# Patient Record
Sex: Male | Born: 1935
Health system: Southern US, Community
[De-identification: ages and names within clinical notes are randomized; demographics above are authoritative.]

## PROBLEM LIST (undated history)

## (undated) DIAGNOSIS — G2581 Restless legs syndrome: Secondary | ICD-10-CM

## (undated) DIAGNOSIS — G4733 Obstructive sleep apnea (adult) (pediatric): Secondary | ICD-10-CM

## (undated) DIAGNOSIS — J189 Pneumonia, unspecified organism: Secondary | ICD-10-CM

## (undated) DIAGNOSIS — I10 Essential (primary) hypertension: Secondary | ICD-10-CM

## (undated) DIAGNOSIS — C801 Malignant (primary) neoplasm, unspecified: Secondary | ICD-10-CM

## (undated) DIAGNOSIS — I2510 Atherosclerotic heart disease of native coronary artery without angina pectoris: Secondary | ICD-10-CM

## (undated) DIAGNOSIS — R5383 Other fatigue: Secondary | ICD-10-CM

## (undated) DIAGNOSIS — R0600 Dyspnea, unspecified: Secondary | ICD-10-CM

## (undated) DIAGNOSIS — E7800 Pure hypercholesterolemia, unspecified: Secondary | ICD-10-CM

## (undated) DIAGNOSIS — R42 Dizziness and giddiness: Secondary | ICD-10-CM

## (undated) HISTORY — PX: KNEE SURGERY: SHX244

## (undated) HISTORY — DX: Pure hypercholesterolemia, unspecified: E78.00

## (undated) HISTORY — PX: CARDIAC CATHETERIZATION: SHX172

## (undated) HISTORY — PX: APPENDECTOMY: SHX54

## (undated) HISTORY — PX: SHOULDER ARTHROSCOPY: SHX128

## (undated) HISTORY — DX: Obstructive sleep apnea (adult) (pediatric): G47.33

## (undated) HISTORY — DX: Malignant (primary) neoplasm, unspecified: C80.1

## (undated) HISTORY — DX: Dizziness and giddiness: R42

## (undated) HISTORY — DX: Dyspnea, unspecified: R06.00

## (undated) HISTORY — DX: Atherosclerotic heart disease of native coronary artery without angina pectoris: I25.10

## (undated) HISTORY — DX: Essential (primary) hypertension: I10

## (undated) HISTORY — DX: Other fatigue: R53.83

## (undated) HISTORY — DX: Pneumonia, unspecified organism: J18.9

---

## 1999-08-13 ENCOUNTER — Ambulatory Visit (HOSPITAL_COMMUNITY): Admission: RE | Admit: 1999-08-13 | Discharge: 1999-08-13 | Payer: Self-pay | Admitting: *Deleted

## 1999-09-08 ENCOUNTER — Ambulatory Visit (HOSPITAL_COMMUNITY): Admission: RE | Admit: 1999-09-08 | Discharge: 1999-09-08 | Payer: Self-pay | Admitting: *Deleted

## 1999-09-08 ENCOUNTER — Encounter: Payer: Self-pay | Admitting: *Deleted

## 1999-10-24 ENCOUNTER — Ambulatory Visit: Admission: RE | Admit: 1999-10-24 | Discharge: 1999-10-24 | Payer: Self-pay | Admitting: *Deleted

## 2000-02-29 ENCOUNTER — Ambulatory Visit: Admission: RE | Admit: 2000-02-29 | Discharge: 2000-02-29 | Payer: Self-pay | Admitting: Pulmonary Disease

## 2000-09-18 ENCOUNTER — Ambulatory Visit (HOSPITAL_COMMUNITY): Admission: RE | Admit: 2000-09-18 | Discharge: 2000-09-18 | Payer: Self-pay | Admitting: Cardiology

## 2000-10-31 ENCOUNTER — Encounter: Admission: RE | Admit: 2000-10-31 | Discharge: 2000-10-31 | Payer: Self-pay | Admitting: *Deleted

## 2000-11-16 ENCOUNTER — Ambulatory Visit (HOSPITAL_COMMUNITY): Admission: RE | Admit: 2000-11-16 | Discharge: 2000-11-16 | Payer: Self-pay | Admitting: Internal Medicine

## 2000-11-16 ENCOUNTER — Encounter: Payer: Self-pay | Admitting: Internal Medicine

## 2001-07-18 ENCOUNTER — Ambulatory Visit (HOSPITAL_COMMUNITY): Admission: RE | Admit: 2001-07-18 | Discharge: 2001-07-18 | Payer: Self-pay | Admitting: *Deleted

## 2001-07-18 ENCOUNTER — Encounter (INDEPENDENT_AMBULATORY_CARE_PROVIDER_SITE_OTHER): Payer: Self-pay

## 2002-10-25 ENCOUNTER — Emergency Department (HOSPITAL_COMMUNITY): Admission: EM | Admit: 2002-10-25 | Discharge: 2002-10-25 | Payer: Self-pay | Admitting: Emergency Medicine

## 2003-08-07 ENCOUNTER — Encounter (INDEPENDENT_AMBULATORY_CARE_PROVIDER_SITE_OTHER): Payer: Self-pay | Admitting: Specialist

## 2003-08-07 ENCOUNTER — Ambulatory Visit (HOSPITAL_COMMUNITY): Admission: RE | Admit: 2003-08-07 | Discharge: 2003-08-07 | Payer: Self-pay | Admitting: *Deleted

## 2005-10-07 ENCOUNTER — Ambulatory Visit (HOSPITAL_COMMUNITY): Admission: RE | Admit: 2005-10-07 | Discharge: 2005-10-07 | Payer: Self-pay | Admitting: *Deleted

## 2010-09-03 ENCOUNTER — Ambulatory Visit: Payer: Self-pay | Admitting: Cardiology

## 2010-09-03 ENCOUNTER — Encounter: Admission: RE | Admit: 2010-09-03 | Discharge: 2010-09-03 | Payer: Self-pay | Admitting: Cardiology

## 2010-09-13 ENCOUNTER — Inpatient Hospital Stay (HOSPITAL_BASED_OUTPATIENT_CLINIC_OR_DEPARTMENT_OTHER): Admission: RE | Admit: 2010-09-13 | Discharge: 2010-09-13 | Payer: Self-pay | Admitting: Cardiology

## 2010-09-13 ENCOUNTER — Ambulatory Visit: Payer: Self-pay | Admitting: Cardiovascular Disease

## 2010-09-27 ENCOUNTER — Encounter: Payer: Self-pay | Admitting: Pulmonary Disease

## 2010-09-27 ENCOUNTER — Ambulatory Visit: Payer: Self-pay

## 2010-09-29 ENCOUNTER — Ambulatory Visit: Payer: Self-pay | Admitting: Cardiology

## 2010-10-21 DIAGNOSIS — R42 Dizziness and giddiness: Secondary | ICD-10-CM | POA: Insufficient documentation

## 2010-10-21 DIAGNOSIS — E7800 Pure hypercholesterolemia, unspecified: Secondary | ICD-10-CM | POA: Insufficient documentation

## 2010-10-21 DIAGNOSIS — I2510 Atherosclerotic heart disease of native coronary artery without angina pectoris: Secondary | ICD-10-CM | POA: Insufficient documentation

## 2010-10-21 DIAGNOSIS — C61 Malignant neoplasm of prostate: Secondary | ICD-10-CM | POA: Insufficient documentation

## 2010-10-21 DIAGNOSIS — I10 Essential (primary) hypertension: Secondary | ICD-10-CM | POA: Insufficient documentation

## 2010-10-22 ENCOUNTER — Ambulatory Visit
Admission: RE | Admit: 2010-10-22 | Discharge: 2010-10-22 | Payer: Self-pay | Source: Home / Self Care | Attending: Pulmonary Disease | Admitting: Pulmonary Disease

## 2010-10-22 DIAGNOSIS — G4733 Obstructive sleep apnea (adult) (pediatric): Secondary | ICD-10-CM | POA: Insufficient documentation

## 2010-10-29 ENCOUNTER — Telehealth: Payer: Self-pay | Admitting: Pulmonary Disease

## 2010-11-18 NOTE — Progress Notes (Signed)
Summary: CPAP ORDER [facility] TOLD THE PT THEY DID NOT HAVE ENOUGH INFO  Phone Note Call from Patient   Caller: Spouse/BARBARA Call For: DR. Shelle Iron Summary of Call: [facility] in [place] called the patient on Monday regarding the CPAP order and advised that the doctors office had not given them the information. Ms Kirsh stated that they have not heard back yet.  Patient can be reached [phone] and really would like to have this taken care of. Initial call taken by: Vedia Coffer,  October 29, 2010 4:15 PM  Follow-up for Phone Call        called and spoke with robin at [facility] and she stated that they have the ov notes from after dec 12 after the sleep study but they need ov notes prior to this ov---explained to her that pt has only seen [facility] once on 1-06, so she stated that they are waiting to get these ov notes from Dr. [name] and once they get them they will be able to fill the order. lmom for pts wife to make her aware and told her to call for any other concerns. Randell Loop CMA  October 29, 2010 4:26 PM      Appended Document: CPAP ORDER [facility] TOLD THE PT THEY DID NOT HAVE ENOUGH INFO please investigate this and see if pt got his cpap machine.  If not, change dme providers to someone who will offer him better service.  If he has not gotten cpap yet, we to adjust his followup visit with me as well.  thanks.

## 2010-11-18 NOTE — Assessment & Plan Note (Signed)
Summary: consult for management of osa   Visit Type:  Initial Consult Copy to:  [name] MD Primary Provider/Referring Provider:  Hessie Diener MD  CC:  Sleep Consult. pt had abnormal sleep study done on 09/27/10.  History of Present Illness: the pt is an 77y/o male who I have been asked to see for management of osa.  He has had a recent sleep study which showed severe osa, with AHI 94/hr and desat to 84%.  He apparently had sleep apnea diagnosed years ago, and tells me he had tolerance issues with cpap at that time.  The pt has been noted to be a loud snorer, and has been told that he has an abnormal breathing pattern during sleep.  He goes to bed btw 11-33mn, and arises at 8-9am to start his day.  He is not rested upon arising.  He notes definite sleep pressure with periods of inactivity, and will often nap during the day.  He get his "second wind" in the evenings because of his napping.  He denies any sleepiness issues with driving.  He tells me his weight is neutral over the last 2 years.    Current Medications (verified): 1)  Prevacid 15 Mg Cpdr (Lansoprazole) .... Once Daily 2)  Coq10 50 Mg Caps (Coenzyme Q10) .... Take 1 Tablet By Mouth Once A Day 3)  B Complex  Tabs (B Complex Vitamins) .... Take 1 Tablet By Mouth Once A Day 4)  Toprol Xl 25 Mg Xr24h-Tab (Metoprolol Succinate) .... Take 1 Tablet By Mouth Two Times A Day 5)  Simvastatin 40 Mg Tabs (Simvastatin) .... Take 1 Tablet By Mouth Once A Day 6)  Aspirin Low Dose 81 Mg Tabs (Aspirin) .... Take 1 Tablet By Mouth Once A Day  Allergies (verified): No Known Drug Allergies  Past History:  Past Medical History:  PROSTATE CANCER (ICD-185) HYPERTENSION (ICD-401.9) HYPERCHOLESTEROLEMIA (ICD-272.0) DIZZINESS, CHRONIC (ICD-780.4) CORONARY ARTERY DISEASE (ICD-414.00)--s/p PCI/stent to LCx 2008    Past Surgical History: appendectomy L knee arthroscopy shoulder arthroscopy 1 stent placement  Family History: Reviewed  history from 10/21/2010 and no changes required. father deceased at age 70 with prostate cancer mother deceased at age 10 with heart disease and copd. father--heart disease daughter: colon cancer  Social History: Reviewed history from 10/21/2010 and no changes required. 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  Review of Systems       The patient complains of shortness of breath with activity, productive cough, acid heartburn, indigestion, tooth/dental problems, and nasal congestion/difficulty breathing through nose.  The patient denies shortness of breath at rest, non-productive cough, coughing up blood, chest pain, irregular heartbeats, loss of appetite, weight change, abdominal pain, difficulty swallowing, sore throat, headaches, sneezing, itching, ear ache, anxiety, depression, hand/feet swelling, joint stiffness or pain, rash, change in color of mucus, and fever.    Vital Signs:  Patient profile:   77 year old male Height:      67 inches Weight:      204.13 pounds BMI:     32.09 O2 Sat:      95 % on Room air Temp:     97.7 degrees F oral Pulse rate:   60 / minute BP sitting:   130 / 62  (right arm) Cuff size:   large  Vitals Entered By: Carver Fila (October 22, 2010 11:29 AM)  O2 Flow:  Room air CC: Sleep  Consult. pt had abnormal sleep study done on 09/27/10 Comments meds and allergies updated Phone number updated Carver Fila  October 22, 2010 11:29 AM    Physical Exam  General:  ow male in nad Eyes:  PERRLA and EOMI.   Nose:  narrowed bilat with deviation of septum, some turbinate hypertrophy Mouth:  normal palate and uvula Neck:  no jvd, tmg, LN Lungs:  clear to auscultation Heart:  rrr, no mrg Abdomen:  soft and nontender, bs+ Extremities:  mild edema noted, no cyanosis  pulses intact distally Neurologic:  awake, but does appear mildly sleepy moves all 4.   Impression &  Recommendations:  Problem # 1:  OBSTRUCTIVE SLEEP APNEA (ICD-327.23) the pt has severe osa by his recent sleep study, and this is clearly impacting his QOL during the day.  I have had a long discussion with the pt about sleep apnea, including its impact on QOL and CV health.  Given the severity of his osa, weight loss coupled with cpap are his best options for treatment.  He is willing to give this a try.  I will set the patient up on cpap at a moderate pressure level to allow for desensitization, and will troubleshoot the device over the next 4-6weeks if needed.  The pt is to call me if having issues with tolerance.  Will then optimize the pressure once patient is able to wear cpap on a consistent basis.  Medications Added to Medication List This Visit: 1)  Prevacid 15 Mg Cpdr (Lansoprazole) .... Once daily  Other Orders: Consultation Level IV (16109) DME Referral (DME)  Patient Instructions: 1)  will start on cpap at a moderate pressure level.  Please call if having tolerance issues. 2)  work on weight reduction 3)  followup with me in 5 weeks.   Immunization History:  Influenza Immunization History:    Influenza:  historical (06/17/2010)  Pneumovax Immunization History:    Pneumovax:  historical (07/17/2008)

## 2010-11-26 ENCOUNTER — Ambulatory Visit (INDEPENDENT_AMBULATORY_CARE_PROVIDER_SITE_OTHER): Payer: Medicare Other | Admitting: Pulmonary Disease

## 2010-11-26 ENCOUNTER — Encounter: Payer: Self-pay | Admitting: Pulmonary Disease

## 2010-11-26 DIAGNOSIS — G4733 Obstructive sleep apnea (adult) (pediatric): Secondary | ICD-10-CM

## 2010-12-01 ENCOUNTER — Telehealth: Payer: Self-pay | Admitting: Pulmonary Disease

## 2010-12-06 ENCOUNTER — Encounter: Payer: Self-pay | Admitting: Pulmonary Disease

## 2010-12-08 NOTE — Progress Notes (Signed)
Summary: cpap issues  Phone Note Call from Patient Call back at [facility] Phone [phone]   Caller: Patient Call For: Jason Murphy Summary of Call: problems re: change in cpap pressure Initial call taken by: Tivis Ringer, CNA,  December 01, 2010 10:38 AM  Follow-up for Phone Call        Pt states pressure was changed to auto Friday. c/o waking up to "powerful air" leaking from mask and tried to adjust for over 30 minutes. Woke up Sat morning dizzy lasting 3 days. Head "cleared on Tuesday". Pt called DME company and has not heard back from them and has not used machine since Friday night. Please advise. Thanks. Zackery Barefoot CMA  December 01, 2010 12:28 PM   Additional Follow-up for Phone Call Additional follow up Details #1::        let him know that he has very severe osa, and probably needs a much higher pressure than what he has been on.  We can optimize pressure at home with auto device, or can return to sleep center and do it all in one night.  If he is having issues with wearing auto , should consider going to sleep center for pressure optimization.  On auto, the pressure is ramping up because HE NEEDS IT. Let me know what he thinks Additional Follow-up by: Barbaraann Share MD,  December 01, 2010 5:02 PM    Additional Follow-up for Phone Call Additional follow up Details #2::    LMOMTCB.Michel Bickers [facility]  December 01, 2010 5:08 PM Patient phoned stated that he was returning a call he can be reached at [phone].Vedia Coffer  December 02, 2010 10:01 AM   called and spoke with pt and explained [facility] recs for pt---pt stated that what he wants is to return the machine to what it was before it was placed on auto---then increase the pressure slightly---pt stated that he does not want to go back to the sleep center to repeat or have another test done.  please advise. Randell Loop CMA  December 02, 2010 10:14 AM   Additional Follow-up for Phone Call Additional follow up Details #3:: Details  for Additional Follow-up Action Taken: I am ok with doing this, as long as he understands we will not be completely treating his sleep apnea.  Ok to put machine on 12 instead of 10 like before, and will see how he does. if he feels he is doing well on 12, followup with me in 6mos.  If not doing well on12, let me know  order sent to [facility] Additional Follow-up by: Barbaraann Share MD,  December 02, 2010 12:44 PM  Pt aware of recs per [facility] and that order has been sent. Carron Curie CMA  December 02, 2010 2:07 PM

## 2010-12-14 NOTE — Letter (Signed)
Summary: CMN for CPAP Supplies/[facility]  CMN for CPAP Supplies/[facility]   Imported By: Sherian Rein 12/08/2010 11:23:52  _____________________________________________________________________  External Attachment:    Type:   Image     Comment:   External Document

## 2010-12-14 NOTE — Assessment & Plan Note (Signed)
Summary: rov for osa   Copy to:  Jason Swaziland MD Primary Provider/Referring Provider:  Hessie Diener MD  CC:  Follow up appt for OSA.  States he is wearing his cpap machine every night.  Approx 7 hours per night.  Denies any complaints with mask.  Pt states his pressure needs to be increased.  States he wakes up feeling rested. Jason Murphy  History of Present Illness: pt comes in today for f/u of his osa.  Started on cpap last visit at moderate pressure level to allow for desensitization.  Wearing compliantly, no issues with mask or pressure.  Sleeping better with increased daytime alertness.  Thinks pressure needs to be higher.   Medications Prior to Update: 1)  Prevacid 15 Mg Cpdr (Lansoprazole) .... Once Daily 2)  Coq10 50 Mg Caps (Coenzyme Q10) .... Take 1 Tablet By Mouth Once A Day 3)  B Complex  Tabs (B Complex Vitamins) .... Take 1 Tablet By Mouth Once A Day 4)  Toprol Xl 25 Mg Xr24h-Tab (Metoprolol Succinate) .... Take 1 Tablet By Mouth Two Times A Day 5)  Simvastatin 40 Mg Tabs (Simvastatin) .... Take 1 Tablet By Mouth Once A Day 6)  Aspirin Low Dose 81 Mg Tabs (Aspirin) .... Take 1 Tablet By Mouth Once A Day  Allergies (verified): No Known Drug Allergies  Past History:  Past medical, surgical, family and social histories (including risk factors) reviewed, and no changes noted (except as noted below).  Past Medical History: Reviewed history from 10/22/2010 and no changes required.  PROSTATE CANCER (ICD-185) HYPERTENSION (ICD-401.9) HYPERCHOLESTEROLEMIA (ICD-272.0) DIZZINESS, CHRONIC (ICD-780.4) CORONARY ARTERY DISEASE (ICD-414.00)--s/p PCI/stent to LCx 2008    Past Surgical History: Reviewed history from 10/22/2010 and no changes required. appendectomy L knee arthroscopy shoulder arthroscopy 1 stent placement  Family History: Reviewed history from 10/22/2010 and no changes required. father deceased at age 57 with prostate cancer mother deceased at age 43 with heart  disease and copd. father--heart disease daughter: colon cancer  Social History: Reviewed history from 10/22/2010 and no changes required. 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  Review of Systems       The patient complains of shortness of breath with activity, productive cough, and sneezing.  The patient denies shortness of breath at rest, non-productive cough, coughing up blood, chest pain, irregular heartbeats, acid heartburn, indigestion, loss of appetite, weight change, abdominal pain, difficulty swallowing, sore throat, tooth/dental problems, headaches, nasal congestion/difficulty breathing through nose, itching, ear ache, anxiety, depression, hand/feet swelling, joint stiffness or pain, rash, change in color of mucus, and fever.    Vital Signs:  Patient profile:   77 year old male Height:      67 inches Weight:      207 pounds O2 Sat:      93 % on Room air Pulse rate:   59 / minute BP sitting:   128 / 70  (left arm) Cuff size:   regular  Vitals Entered By: Arman Filter LPN (November 26, 2010 11:04 AM)  O2 Flow:  Room air CC: Follow up appt for OSA.  States he is wearing his cpap machine every night.  Approx 7 hours per night.  Denies any complaints with mask.  Pt states his pressure needs to be increased.  States he wakes up feeling rested.  Comments Medications reviewed with patient Arman Filter LPN  November 26, 2010 11:04 AM  Physical Exam  General:  ow male in nad Nose:  no skin breakdown or pressure necrosis from cpap mask Lungs:  clear to auscultation. Extremities:  no significant edema, no cyanosis  Neurologic:  alert and oriented, moves all 4 does not appear sleepy.   Impression & Recommendations:  Problem # 1:  OBSTRUCTIVE SLEEP APNEA (ICD-327.23) the pt is much improved since starting cpap wrt his sleep quality and daytime alertness. He is not having any  issues with the mask fit, but feels he needs a higher pressure.  Will need to optimize pressure for him on auto setting, and have encouraged him to work on weight loss as well. Care Plan:  At this point, will arrange for the patient's machine to be changed over to auto mode for 2 weeks to optimize their pressure.  I will review the downloaded data once sent by dme, and also evaluate for compliance, leaks, and residual osa.  I will call the patient and dme to discuss the results, and have the patient's machine set appropriately.  This will serve as the pt's cpap pressure titration.  Other Orders: Est. Patient Level IV (47829) DME Referral (DME)  Patient Instructions: 1)  will have your pressure calibrated on auto mode for 2 weeks, and I will call you with results. 2)  work on weight loss 3)  followup with me in 6mos if doing well.

## 2010-12-28 LAB — POCT I-STAT 3, ART BLOOD GAS (G3+)
Acid-base deficit: 1 mmol/L (ref 0.0–2.0)
Bicarbonate: 22.8 mEq/L (ref 20.0–24.0)
O2 Saturation: 94 %
TCO2: 24 mmol/L (ref 0–100)
pCO2 arterial: 36.1 mmHg (ref 35.0–45.0)
pH, Arterial: 7.408 (ref 7.350–7.450)
pO2, Arterial: 70 mmHg — ABNORMAL LOW (ref 80.0–100.0)

## 2010-12-28 LAB — POCT I-STAT 3, VENOUS BLOOD GAS (G3P V)
Bicarbonate: 25.9 mEq/L — ABNORMAL HIGH (ref 20.0–24.0)
O2 Saturation: 65 %
TCO2: 27 mmol/L (ref 0–100)
pCO2, Ven: 45.1 mmHg (ref 45.0–50.0)
pH, Ven: 7.367 — ABNORMAL HIGH (ref 7.250–7.300)
pO2, Ven: 35 mmHg (ref 30.0–45.0)

## 2011-01-17 ENCOUNTER — Other Ambulatory Visit: Payer: Self-pay | Admitting: Pulmonary Disease

## 2011-01-17 DIAGNOSIS — G4733 Obstructive sleep apnea (adult) (pediatric): Secondary | ICD-10-CM

## 2011-01-20 ENCOUNTER — Encounter: Payer: Self-pay | Admitting: Pulmonary Disease

## 2011-01-20 ENCOUNTER — Ambulatory Visit (INDEPENDENT_AMBULATORY_CARE_PROVIDER_SITE_OTHER): Payer: Medicare Other | Admitting: Pulmonary Disease

## 2011-01-20 VITALS — BP 110/70 | HR 56 | Temp 97.8°F | Ht 69.0 in | Wt 207.0 lb

## 2011-01-20 DIAGNOSIS — G4733 Obstructive sleep apnea (adult) (pediatric): Secondary | ICD-10-CM

## 2011-01-20 NOTE — Progress Notes (Signed)
  Subjective:    Patient ID: Jason Murphy, male    DOB: Oct 26, 1919, 77 y.o.   MRN: [id number]  HPI Pt sent here by dme wrongly.  No visit   Review of Systems  Constitutional: Negative for fever and unexpected weight change.  HENT: Positive for ear pain. Negative for nosebleeds, congestion, sore throat, rhinorrhea, sneezing, trouble swallowing, dental problem, postnasal drip and sinus pressure.   Eyes: Negative for redness and itching.  Respiratory: Negative for cough, chest tightness, shortness of breath and wheezing.   Cardiovascular: Negative for palpitations and leg swelling.  Gastrointestinal: Negative for nausea and vomiting.  Genitourinary: Negative for dysuria.  Musculoskeletal: Negative for joint swelling.  Skin: Negative for rash.  Neurological: Negative for headaches.  Hematological: Does not bruise/bleed easily.  Psychiatric/Behavioral: Negative for dysphoric mood. The patient is not nervous/anxious.        Objective:   Physical Exam        Assessment & Plan:

## 2011-01-20 NOTE — Patient Instructions (Signed)
Will get back on auto for 2 weeks, and will call once I get download results. Work on [employer] loss Keep already established f/u apptm

## 2011-02-26 ENCOUNTER — Other Ambulatory Visit: Payer: Self-pay | Admitting: Pulmonary Disease

## 2011-02-26 DIAGNOSIS — G4733 Obstructive sleep apnea (adult) (pediatric): Secondary | ICD-10-CM

## 2011-03-04 NOTE — Op Note (Signed)
NAME:  Jason Murphy, Jason Murphy NO.:  [id number]   MEDICAL RECORD NO.:  [id number]          PATIENT TYPE:  AMB   LOCATION:  ENDO                         FACILITY:  [facility]   PHYSICIAN:  Georgiana Spinner, M.D.    DATE OF BIRTH:  [birth year]   DATE OF PROCEDURE:  10/07/2005  DATE OF DISCHARGE:  10/07/2005                                 OPERATIVE REPORT   PROCEDURE:  Colonoscopy.   INDICATIONS:  Colon polyps.   ANESTHESIA:  Demerol 40, Versed 5 mg.   PROCEDURE:  The patient was mildly sedated in the left lateral decubitus  position. A rectal exam was performed which was unremarkable. Subsequently  the Olympus videoscopic colonoscope was inserted in the rectum and passed  under direct vision to the cecum, identified by ileocecal valve and  appendiceal orifice. The prep was suboptimal in that there were areas,  especially in the right colon that were covered with a dark fecal material  which would not wash off despite washing and suctioning.  It was adherent to  the colonic wall. Therefore the colonoscope was then slowly withdrawn taking  circumferential views of colonic mucosa as best we could visualize until we  reached the rectum which appeared normal and directly showed hemorrhoids on  retroflexed view.  The endoscope was straightened and withdrawn. The  patient's vital signs and pulse oximetry remained stable. The patient  tolerated procedure well without apparent complications.   FINDINGS:  Internal hemorrhoids, otherwise an unremarkable examination  limited by the prep which was suboptimal but no gross lesions seen.   PLAN:  Consider repeat examination in five years.           ______________________________  Georgiana Spinner, M.D.     GMO/MEDQ  D:  10/07/2005  T:  10/11/2005  Job:  [id number]

## 2011-03-04 NOTE — Procedures (Signed)
[facility]  Patient:    XAIDEN, FLEIG Visit Number: [id number] MRN: [id number]          Service Type: Attending:  Sabino Gasser, M.D. Dictated by:   Sabino Gasser, M.D. Proc. Date: 07/18/01                             Procedure Report  PROCEDURE PERFORMED:  Upper endoscopy.  ANESTHESIA:  Demerol 30, Versed 3 mg  DESCRIPTION OF PROCEDURE:  With the patient mildly sedated in the left lateral decubitus position, the Olympus videoscopic endoscope was inserted in the mouth, passed under direct vision through the esophagus which appeared grossly normal.  The patient has been having dysphagia symptoms however.  We photographed the distal most esophagus, entered into the stomach.  Fundus, body and antrum were reviewed and there were some erythematous changes at the border of the body with the antrum that were photographed and subsequently biopsied.  We entered into the duodenal bulb, second portion of duodenum all of which appeared normal.  From this point the endoscope was slowly withdrawn taking circumferential views of the entire duodenal mucosa until the endoscope was pulled back into the stomach, placed in retroflexion to view the stomach from below.  The endoscope was then straightened and a guide wire was passed. The endoscope was removed, taking circumferential views of the remaining gastric and esophageal mucosa.  Subsequently over the guide wire dilators of 15 and 18 mm were passed with minimal resistance. There was some blood seen on the removal of the 18 dilator.  Guide wire was removed with it.  The endoscope was reinserted.  There was some blood seen in the esophagus but no active bleeding noted.  The patient vital signs and pulse oximeter remained stable. The patient tolerated the procedure well without apparent complications.  FINDINGS:  Esophageal dilation to 15 and 18 Savory.  Biopsy of gastric mucosa in the body.  Await biopsy report.  The  patient will call me for results and follow up with me as an outpatient.  Will await clinical response from dilation.  Will have the patient on a liquid diet today.  Resume regular diet tomorrow.  Proceed to colonoscopy as planned.Dictated by:   Sabino Gasser, M.D.  Attending:  Sabino Gasser, M.D. DD:  07/18/01 TD:  07/18/01 Job: [id number]

## 2011-03-04 NOTE — Procedures (Signed)
[facility]  Patient:    Jason Murphy, Jason Murphy Visit Number: [id number] MRN: [id number]          Service Type: END Location: ENDO Attending Physician:  Sabino Gasser Proc. Date: 07/18/01 Admit Date:  07/18/2001                             Procedure Report  PROCEDURE:  Colonoscopy.  INDICATIONS:  Colon polyp.  ANESTHESIA:  Demerol 10, Versed 2 mg.  DESCRIPTION OF PROCEDURE:  With the patient mildly sedated in the left lateral decubitus position, a rectal examination was performed which showed decreased sphincter tone.  Subsequently, the Olympus videoscopic colonoscope was inserted in the rectum and passed under direct vision through a tortuous sigmoid colon that was full of diverticula until we reached the cecum, identified by the ileocecal valve and appendiceal orifice, both of which were photographed.  The prep was slightly suboptimal, in that there were areas of liquid, semisolid stool.  These areas had to be rinsed and suctioned.  But we, from this point, slowly withdrew the colonoscope, taking circumferential views of the colonic mucosa, stopping only at approximately 40 cm from the anal verge, at which point a polyp on a stalk was seen.  It was approximately 8 mm in size to 1 cm in size.  It was removed using snare cautery technique setting of 20-20 blended current, and the tissue was retrieved.  The endoscope was then subsequently withdrawn all the way to the rectum which appeared normal on direct view and showed internal hemorrhoids on retroflex view.  The endoscope was straightened and withdrawn.  The patients vital signs and pulse oximeter remained stable.  The patient tolerated the procedure well without apparent complications.  FINDINGS: 1. Significant diverticulosis of the sigmoid colon. 2. Internal hemorrhoids. 3. A polyp at 40 cm.  PLAN:  Await biopsy report.  The patient will call me for results and follow up with me as an  outpatient. Attending Physician:  Sabino Gasser DD:  07/18/01 TD:  07/18/01 Job: [id number]

## 2011-03-04 NOTE — Procedures (Signed)
[place]  Patient:    Jason Murphy, Jason Murphy                       MRN: [id number] Proc. Date: 11/16/00 Adm. Date:  [id number] Attending:  Lewayne Bunting CC:         Lilia Pro, M.D.   Procedure Report  PROCEDURE:  Head-up tilt table testing.  INDICATION:  Recurrent near-syncopal spells and dizziness.  REFERRING PHYSICIAN: Lilia Pro, M.D.  INTRODUCTION:  The patient is a 77 year old man with incomplete left bundle branch block and no obstructive coronary disease with preserved LV function, who was referred for evaluation of dizzy spells along with near-syncopal spells.  His symptoms have occurred for over 10 years but over the last several months have increased in frequency and severity.  He has been monitored on several occasions with no obvious cause.  His catheterization showed preserved LV function and no obstructive coronary disease.  He is now referred for head-up tilt table testing.  DESCRIPTION OF PROCEDURE:  After informed consent was obtained, the patient was taken to the diagnostic EP lab in the fasting state.  After the usual preparation, he was placed in the supine position, and his initial blood pressure was 130/83 with a heart rate in the 60s.  He was subsequently placed in the upright position, where his initial blood pressure was 137/86 with a pulse of 75.  At five minutes into tilting, the blood pressure was 140/72 with a heart rate of 73, and he was otherwise stable.  At minute 6 into tilting, his blood pressure dropped down to 85/50 without significant change in his heart rate.  At seven minutes into tilting, his blood pressure dropped again down to 59/30, and his pulse dropped to 52 and he began to feel diaphoretic and dizzy.  At this point, he was subsequently returned to the supine position, and his blood pressure quickly recovered.  He was returned to his room in good condition.  COMPLICATIONS:  None.  RESULTS:   This demonstrates an early positive head-up tilt table test with both evidence of vasodepression as well as cardiac inhibition during tilting. My recommendations at this time would be to instruct the patient to maintain a high-salt, high-fluid diet.  Certainly extremity supports and centrally-acting drugs like Celexa or Florinef could be tried for this patient if he fails initial conservative measures. DD:  11/16/00 TD:  11/16/00 Job: [id number]

## 2011-03-04 NOTE — Cardiovascular Report (Signed)
[place]  Patient:    Jason Murphy, Jason Murphy                       MRN: [id number] Proc. Date: 09/18/00 Adm. Date:  [id number] Disc. Date: [id number] Attending:  Silvestre Mesi CC:         Lilia Pro, M.D.  Cardic Cath Lab   Cardiac Catheterization  REFERRING PHYSICIAN:  Lilia Pro, M.D.  PROCEDURES: 1. Left heart catheterization. 2. Coronary cineangiography. 3. Left ventricular cineangiography. 4. Abdominal aortogram. 5. Perclose of the right femoral artery.  INDICATIONS FOR PROCEDURES:  This 77 year old male has a long history of dizzy spells which are caused by stress and a treadmill exercise tolerance test showed exercise-induced left bundle branch block or rate-related left bundle branch block.  He was then scheduled for cardiac catheterization.  PROCEDURE:  After signing an informed consent, the patient was premedicated with 50 mg of Benadryl intravenously and brought to the cardiac catheterization lab.  His right groin was prepped an draped in a sterile fashion and anesthetized locally with 1% lidocaine.  A 6-French introducer sheath was inserted percutaneously into the right femoral artery.  A 6-French #4 Judkins coronary catheter used to make injections into the coronary arteries.  A 6-French pigtail catheter wsa used to measure pressures in the left ventricle and aorta and to make midstream injections into the left ventricle and abdominal aorta.  The patient tolerated the procedure well with no no complications noted.  At the end of the procedure the catheter and sheath were removed form the right femoral artery and hemostasis was easily obtained with a Perclose closure system.  MEDICATIONS GIVEN:  None.  HEMODYNAMIC DATA:  Left ventricular pressure 146/10-19, aortic pressure 154/76 with a mean of 103.  Left ventricular ejection fraction was estimated at approximately 60%.  CINE FINDINGS:  CINEANGIOGRAPHY:  LEFT  CORONARY ARTERY:  The ostium and left main appeared normal.  LEFT ANTERIOR DESCENDING:  The left anterior descending appears normal.  CIRCUMFLEX CORONARY ARTERY:  The circumflex has a minor plaque in it middle segment and at the takeoff of the first obtuse marginal branch.  There is very normal flow through this segment and this does not be a significant lesion. The remainder of the circumflex appears normal.  RIGHT CORONARY ARTERY:  The right coronary artery appears normal.  The right coronary artery is a large dominant vessel supplying the posterior descending and posterolateral branches.  LEFT VENTRICULAR CINEANGIOGRAM:  The left ventricular chamber size and contractility appear normal.  The ejection fraction was estimated at approximately 60%. Mitral and aortic valves appeared normal.  ABDOMINAL AORTOGRAM:  The abdominal aorta is mildly tortuous and there is very mild athereosclerotic plaque but ther is no significant stenosis and no aneurysm.  The renal arteries have normal flow.  There is a very minor plaque at the takeoff of the right renal artery.  FINAL DIAGNOSES: 1. Minor coronary artery disease with 20-30% circumflex lesion in first obtuse    marginal branch. 2. Normal appearing coronary arteries otherwise. 3. Normal left ventricular function. 4. Normal mitral and aortic valves. 5. Mild tortuosity of the abdominal aorta with mild atherosclerotic plaque,    nonobstructive. 6. Minor plaque right renal artery. 7. Successful Perclose of the right femoral artery.  DISPOSITION:  Will monitor in the short stay unit until stable and anticipate discharge today.  Will also recommend he have a follow-up carotid duplex ultrasound to look for flow in his  vertebral arteries, as this study was last done approximately 10 years ago with similar symptoms. DD:  09/18/00 TD:  09/18/00 Job: [id number]

## 2011-03-04 NOTE — Op Note (Signed)
   NAME:  Jason Murphy, Jason Murphy NO.:  [id number]   MEDICAL RECORD NO.:  [id number]                   PATIENT TYPE:  AMB   LOCATION:  ENDO                                 FACILITY:  [facility]   PHYSICIAN:  Georgiana Spinner, M.D.                 DATE OF BIRTH:  [birth year]   DATE OF PROCEDURE:  08/07/2003  DATE OF DISCHARGE:                                 OPERATIVE REPORT   PROCEDURE:  Colonoscopy with polypectomy.   INDICATIONS FOR PROCEDURE:  Colon polyps.   ANESTHESIA:  Demerol 40, Versed 4 mg.   DESCRIPTION OF PROCEDURE:  With the patient mildly sedated in the left  lateral decubitus position, the Olympus videoscopic colonoscope was inserted  into the rectum and passed under direct vision to the cecum identified by  ileocecal valve and appendiceal orifice, both of which were photographed.  From this point, the colonoscope was slowly withdrawn, taking  circumferential views of the colonic mucosa, stopping to photograph  diverticula seen along the way. Both right and left colon, but fairly mild  until we reached 40 cm from the anal verge at which point a polyp was seen,  photographed, and removed using snare cautery technique. Setting of 2200 on  the [facility] generator.  The tissue was retrieved for  pathology.  The endoscope was withdrawn all the way to the rectum which  appeared normal on direct view, and showed hemorrhoids on retroflexed view.  The endoscope was straightened and withdrawn.  The patient's vital signs and  pulse oximetry remained stable. The patient tolerated the procedure well  without apparent complications.   FINDINGS:  1. Polyp 40 cm from the anal verge. Await biopsy report. The patient will     call me for results and follow up with me as an outpatient.  2. Internal hemorrhoids and scattered diverticula throughout.                                               Georgiana Spinner, M.D.    GMO/MEDQ  D:  08/07/2003  T:   08/07/2003  Job:  [id number]

## 2011-04-26 ENCOUNTER — Other Ambulatory Visit: Payer: Self-pay | Admitting: Cardiology

## 2011-04-27 ENCOUNTER — Other Ambulatory Visit: Payer: Self-pay | Admitting: *Deleted

## 2011-04-27 MED ORDER — SIMVASTATIN 40 MG PO TABS: 40.0000 mg | ORAL_TABLET | Freq: Every day | ORAL | Status: DC

## 2011-04-27 NOTE — Telephone Encounter (Signed)
Fax received from pharmacy. Refill completed. Jodette Tumeka Chimenti RN  

## 2011-05-10 ENCOUNTER — Telehealth: Payer: Self-pay | Admitting: Pulmonary Disease

## 2011-05-10 NOTE — Telephone Encounter (Signed)
Spoke with Mrs Harbuck, informed her as sson as Dr Shelle Iron signs we will fax to [facility], also left msg with Vernelle Emerald @ [facility], we will fax as soon as Dr Shelle Iron signs .Jason Murphy

## 2011-05-10 NOTE — Telephone Encounter (Signed)
Spoke with the pt wife and she states that the pt is changing from [facility] to [facility] for his CPAP supplies and that they have been trying to get a CMN signed for this but have not received it back yet. I advised I will send message to Alida to see if she has seen these forms. Please advise. Carron Curie, CMA

## 2011-05-26 ENCOUNTER — Other Ambulatory Visit: Payer: Self-pay | Admitting: Cardiology

## 2011-05-27 NOTE — Telephone Encounter (Signed)
escribe medication per fax request  

## 2011-06-13 ENCOUNTER — Encounter: Payer: Self-pay | Admitting: Cardiology

## 2011-06-21 ENCOUNTER — Ambulatory Visit: Payer: Medicare Other | Admitting: Cardiology

## 2011-06-28 ENCOUNTER — Ambulatory Visit: Payer: Self-pay | Admitting: Pulmonary Disease

## 2011-07-15 ENCOUNTER — Encounter: Payer: Self-pay | Admitting: Cardiology

## 2011-10-05 ENCOUNTER — Encounter: Payer: PRIVATE HEALTH INSURANCE | Admitting: Family Medicine

## 2011-10-18 ENCOUNTER — Encounter: Payer: Self-pay | Admitting: Family Medicine

## 2012-01-01 ENCOUNTER — Emergency Department: Payer: Self-pay | Admitting: Emergency Medicine

## 2012-01-01 LAB — TSH: Thyroid Stimulating Horm: 2.86 u[IU]/mL

## 2012-01-01 LAB — CBC
HCT: 42.3 % (ref 40.0–52.0)
MCH: 33 pg (ref 26.0–34.0)
MCV: 98 fL (ref 80–100)
Platelet: 258 10*3/uL (ref 150–440)
RBC: 4.3 10*6/uL — ABNORMAL LOW (ref 4.40–5.90)

## 2012-01-01 LAB — COMPREHENSIVE METABOLIC PANEL
Alkaline Phosphatase: 75 U/L (ref 50–136)
Anion Gap: 7 (ref 7–16)
BUN: 17 mg/dL (ref 7–18)
Chloride: 105 mmol/L (ref 98–107)
Co2: 25 mmol/L (ref 21–32)
Creatinine: 0.91 mg/dL (ref 0.60–1.30)
EGFR (African American): >60
Osmolality: 275 (ref 275–301)
SGPT (ALT): 31 U/L
Sodium: 137 mmol/L (ref 136–145)

## 2012-01-01 LAB — MAGNESIUM: Magnesium: 1.8 mg/dL

## 2012-01-02 LAB — URINALYSIS, COMPLETE
Blood: NEGATIVE
Ketone: NEGATIVE
Leukocyte Esterase: NEGATIVE
Nitrite: NEGATIVE
Ph: 6 (ref 4.5–8.0)
Protein: NEGATIVE
Specific Gravity: 1.015 (ref 1.003–1.030)
Squamous Epithelial: <1

## 2012-01-02 LAB — COMPREHENSIVE METABOLIC PANEL
Albumin: 3.9 g/dL (ref 3.4–5.0)
BUN: 15 mg/dL (ref 7–18)
Bilirubin,Total: 0.7 mg/dL (ref 0.2–1.0)
Chloride: 103 mmol/L (ref 98–107)
Co2: 28 mmol/L (ref 21–32)
Creatinine: 1.03 mg/dL (ref 0.60–1.30)
EGFR (African American): >60
EGFR (Non-African Amer.): >60
Glucose: 80 mg/dL (ref 65–99)
Osmolality: 287 (ref 275–301)
Potassium: 3.7 mmol/L (ref 3.5–5.1)
SGOT(AST): 29 U/L (ref 15–37)
SGPT (ALT): 27 U/L
Sodium: 144 mmol/L (ref 136–145)
Total Protein: 8 g/dL (ref 6.4–8.2)

## 2012-01-02 LAB — CBC
HCT: 42.6 % (ref 40.0–52.0)
HGB: 14.4 g/dL (ref 13.0–18.0)
MCH: 33 pg (ref 26.0–34.0)
MCHC: 33.8 g/dL (ref 32.0–36.0)
Platelet: 278 10*3/uL (ref 150–440)
RDW: 13.6 % (ref 11.5–14.5)
WBC: 10.1 10*3/uL (ref 3.8–10.6)

## 2012-01-02 LAB — LIPASE, BLOOD: Lipase: 94 U/L (ref 73–393)

## 2012-01-03 ENCOUNTER — Inpatient Hospital Stay: Payer: Self-pay | Admitting: Surgery

## 2012-01-05 LAB — CBC WITH DIFFERENTIAL/PLATELET
Basophil %: 0 %
Eosinophil #: 0 10*3/uL (ref 0.0–0.7)
Eosinophil %: 0.4 %
HCT: 37 % — ABNORMAL LOW (ref 40.0–52.0)
HGB: 12.4 g/dL — ABNORMAL LOW (ref 13.0–18.0)
Lymphocyte #: 1.4 10*3/uL (ref 1.0–3.6)
MCH: 32.6 pg (ref 26.0–34.0)
MCHC: 33.6 g/dL (ref 32.0–36.0)
MCV: 97 fL (ref 80–100)
Monocyte #: 0.8 10*3/uL — ABNORMAL HIGH (ref 0.0–0.7)
Neutrophil #: 8.8 10*3/uL — ABNORMAL HIGH (ref 1.4–6.5)
RBC: 3.81 10*6/uL — ABNORMAL LOW (ref 4.40–5.90)
RDW: 13.5 % (ref 11.5–14.5)

## 2012-01-05 LAB — BASIC METABOLIC PANEL
Anion Gap: 10 (ref 7–16)
BUN: 13 mg/dL (ref 7–18)
Calcium, Total: 7.8 mg/dL — ABNORMAL LOW (ref 8.5–10.1)
Chloride: 105 mmol/L (ref 98–107)
Creatinine: 1.09 mg/dL (ref 0.60–1.30)
EGFR (African American): >60
EGFR (Non-African Amer.): >60
Glucose: 130 mg/dL — ABNORMAL HIGH (ref 65–99)
Sodium: 141 mmol/L (ref 136–145)

## 2012-01-06 LAB — PATHOLOGY REPORT

## 2012-03-15 ENCOUNTER — Ambulatory Visit: Payer: Medicare Other | Admitting: Cardiology

## 2012-06-28 ENCOUNTER — Telehealth: Payer: Self-pay | Admitting: Pulmonary Disease

## 2012-06-28 NOTE — Telephone Encounter (Signed)
Pt last seen April 2012, cancelled last ov and did not reschedule. Spoke with Mayra Reel and advised that the pt needs to schedule ov and then will talk about ordering supplies. She verbalized understanding and states nothing further needed. Will have the pt call for appt.

## 2012-10-08 ENCOUNTER — Encounter: Payer: Self-pay | Admitting: Cardiology

## 2012-10-08 ENCOUNTER — Ambulatory Visit (INDEPENDENT_AMBULATORY_CARE_PROVIDER_SITE_OTHER): Payer: Medicare Other | Admitting: Cardiology

## 2012-10-08 VITALS — BP 145/71 | HR 58 | Ht 67.0 in | Wt 203.0 lb

## 2012-10-08 DIAGNOSIS — I10 Essential (primary) hypertension: Secondary | ICD-10-CM

## 2012-10-08 DIAGNOSIS — R42 Dizziness and giddiness: Secondary | ICD-10-CM

## 2012-10-08 DIAGNOSIS — I2510 Atherosclerotic heart disease of native coronary artery without angina pectoris: Secondary | ICD-10-CM

## 2012-10-08 NOTE — Patient Instructions (Signed)
Continue your current therapy  I will see you again in one year.   

## 2012-10-09 NOTE — Progress Notes (Signed)
Jason Murphy Date of Birth: 21-Jun-1920 Medical Record #[id number]  History of Present Illness: Jason Murphy is seen for followup today. He is a pleasant 78 year old white male who was last seen in December of 2011. He has a history of coronary disease and had stenting of the left circumflex artery in 2008. He had cardiac catheterization in 2011 which showed a patent stent. He has a long history of chronic dizziness with extensive cardiac and neurologic evaluation the past. He apparently did have an abnormal tilt table test for a vasopressor response but this is really not consistent with his symptoms of dizziness which lasts for several hours even after he lies down. Since his last visit he has done well. He still has his dizziness. He denies any chest pain or dyspnea. He is now on CPAP therapy for sleep apnea.  Current Outpatient Prescriptions on File Prior to Visit  Medication Sig Dispense Refill  . aspirin 81 MG tablet Take 81 mg by mouth daily.        Marland Kitchen b complex vitamins tablet Take 1 tablet by mouth daily.        Marland Kitchen co-enzyme Q-10 50 MG capsule Take 50 mg by mouth daily.        . Lansoprazole (PREVACID 24HR PO) Take 2 capsules by mouth daily.        Marland Kitchen levothyroxine (SYNTHROID, LEVOTHROID) 25 MCG tablet Take 25 mcg by mouth daily.      . simvastatin (ZOCOR) 40 MG tablet TAKE 1 TABLET BY MOUTH EVERY NIGHT AT BEDTIME  30 tablet  5    No Known Allergies  Past Medical History  Diagnosis Date  . Coronary artery disease   . Dizziness     chronic dizziness  . Pneumonia   . Hypercholesterolemia   . Hypertension   . Obstructive sleep apnea   . Cancer     prostate  . Fatigue     Progressive fatigue with question of sleep apnea  . Dyspnea     Past Surgical History  Procedure Date  . Appendectomy   . Knee surgery     arthroscopic left knee surgery  . Shoulder arthroscopy   . Cardiac catheterization     Ejection Fraction was 60%    History  Smoking status  . Never Smoker     Smokeless tobacco  . Never Used    History  Alcohol Use  . Yes    Comment: 1/2 glass wine daily    Family History  Problem Relation Age of Onset  . COPD Mother     Review of Systems: The review of systems is positive for chronic dizziness.  All other systems were reviewed and are negative.  Physical Exam: BP 145/71  Pulse 58  Ht 5\' 7"  (1.702 m)  Wt 203 lb (92.08 kg)  BMI 31.79 kg/m2 He is a pleasant, elderly white male in no acute distress. HEENT: Normocephalic, atraumatic. Pupils are equal round and reactive. Sclera are clear. Oropharynx is clear. Neck is without JVD, adenopathy, thyromegaly, or bruits. Lungs: Clear Cardiovascular: Regular rate and rhythm without gallop or murmur. Abdomen: Soft and nontender. No masses or bruits. Extremities: Femoral and pedal pulses are 2+. He has no edema. Skin: Warm and dry Neuro: Alert and oriented x3. No focal findings. Cranial nerves II through XII are intact. LABORATORY DATA:  ECG today demonstrates normal sinus rhythm with first-degree AV block and a rate of 58 beats per minute. He has a left bundle branch block and  left axis deviation. Assessment / Plan:  1. Coronary disease with prior stenting of the left circumflex artery in 2008. He is asymptomatic. Followup cardiac catheterization in 2011 showed nonobstructive disease. Continue aspirin therapy. I will followup again in one year.  2. Chronic dizziness unchanged  3. Hypercholesterolemia-continue simvastatin.  4. Left bundle branch block.  5. Sleep apnea-on CPAP therapy.

## 2013-06-28 ENCOUNTER — Encounter: Payer: Self-pay | Admitting: Cardiology

## 2013-06-28 ENCOUNTER — Encounter: Payer: Self-pay | Admitting: Cardiovascular Disease

## 2015-02-02 ENCOUNTER — Ambulatory Visit (INDEPENDENT_AMBULATORY_CARE_PROVIDER_SITE_OTHER): Payer: PPO | Admitting: Neurology

## 2015-02-02 ENCOUNTER — Encounter: Payer: Self-pay | Admitting: Neurology

## 2015-02-02 VITALS — BP 157/76 | HR 61 | Ht 67.5 in | Wt 202.0 lb

## 2015-02-02 DIAGNOSIS — Z638 Other specified problems related to primary support group: Secondary | ICD-10-CM

## 2015-02-02 DIAGNOSIS — F411 Generalized anxiety disorder: Secondary | ICD-10-CM | POA: Diagnosis not present

## 2015-02-02 DIAGNOSIS — F439 Reaction to severe stress, unspecified: Secondary | ICD-10-CM

## 2015-02-02 NOTE — Patient Instructions (Signed)
-   continue current medications  - I think most likely your symptoms related to stress and stressful situations recently in your life. I recommend to improve your current situation and to decrease stress life. - I would not recommend mediation at this time but you need to work on the underlying causes - call us if there is more symptoms or problems - follow up as needed.

## 2015-02-02 NOTE — Progress Notes (Signed)
NEUROLOGY CLINIC NEW PATIENT NOTE  NAME: Jason Murphy DOB: 09-17-1920 REFERRING PHYSICIAN: Lynnell Jude, MD  I saw Jason Murphy as a new consult in the neurovascular clinic today regarding  Chief Complaint  Patient presents with  . NP Dizziness Bliss    Rm 1, Alone  .  HPI: Jason Murphy is a 81 y.o. male with PMH of CAD s/p stenting and chronic dizziness last seen in this practice on 03/03/2011 who presents as a new patient for head heaviness. He by Dr. Erling Murphy in 2012, in his note, he stated that patient had long-standing dizziness, beginning 30 years ago. Episodes occur 15 times per months and will be present in the lying, sitting and standing position. His eyes wouldn't get out of focus and he has nausea and occasionally vomiting with the dizziness. With the dizziness, he will lie down and go to sleep. His dizziness is made worse by any exercises. He does not have headache. His mom had migraine headaches. He was evaluated with tilt table test by Dr. Lovena Murphy on 11/16/2000 which was positive and placed on medications for cardiogenic syncope. He was also evaluated at the [facility] in January 2006 with normal blood studies, audiogram showing presbycusis, and video ENG showing no evidence of BPPV. Dr. love first saw the patient on 09/22/2006 because of the question of altered or migraine and obtain an EEG which was normal. He was put on Depakote. However his symptoms getting worse, he cannot play sports, not able to walk around the leg, sometimes cannot walk without assistance. At the last visit, Dr. love ordered MRI study of the brain and of the CP angle to rule out any structural abnormalities. However, I do not have that MRI report available at this time.  At this visit, patient stated that he was later diagnosed with basilar type of a migraine without headache by [facility]. And gradually he is dizzy episode become infrequent, currently once every 2 months. The last one was on 12/30/2014.  However, the reason he came in today is that the episode of 12/30/2014 is somehow unusual for him. Normally 1 Jason Murphy*'s, he will lie down, and about 3-4 hours the episode will be gone, and he will be able to back to his baseline. However this time, the dizziness lasting longer and to the second day. When he woke up second day he still feel head heaviness, not really dizzy, but just stayed with him almost all the time since then. Everyday he would woke up in the morning he feels okay, around 11-12, he does not feel right and has to lie down, after 30 minutes he feels better, however in the mid afternoon he started to feel head heaviness again, and he has to lie down again. This happens every day. He has some difficulty to describe the feelings, but including head heaviness, not able to focus, nonfunctioning right, not thinking well, however he denies headache, nausea vomiting, weakness or numbness, no speech or vision changes.  On further questioning, he admits that he had a a lot of stress recently, especially financially. His wife has compulsive buying behavior recently, spending dramatic large amount of money on the left side and on the TV but without using the stuff she bought. His house now has boxes everywhere, his credit card debt increase from $40,000 up to $55,000 in just 2 weeks. He stated that he feels head heaviness whenever he sees  New boxes coming to his house.  Patient had  a carotid Doppler study in January 2003 which were normal, he had a prostate cancer in 2006 which is not treated with PSA in the 5.0 range. He has a OSA, using CPAP machine at night. He also had CAD with stenting in 2009.  He denies any smoking, alcohol drinking, or substance abuse.  Past Medical History  Diagnosis Date  . Coronary artery disease   . Dizziness     chronic dizziness  . Pneumonia   . Hypercholesterolemia   . Hypertension   . Obstructive sleep apnea   . Cancer     prostate  . Fatigue      Progressive fatigue with question of sleep apnea  . Dyspnea    Past Surgical History  Procedure Laterality Date  . Appendectomy    . Knee surgery      arthroscopic left knee surgery  . Shoulder arthroscopy    . Cardiac catheterization      Ejection Fraction was 60%, stent   Family History  Problem Relation Age of Onset  . COPD Mother    Current Outpatient Prescriptions  Medication Sig Dispense Refill  . aspirin 81 MG tablet Take 81 mg by mouth daily.      Marland Kitchen b complex vitamins tablet Take 1 tablet by mouth daily.      Marland Kitchen co-enzyme Q-10 50 MG capsule Take 50 mg by mouth daily.      . Lansoprazole (PREVACID 24HR PO) Take 2 capsules by mouth daily.      Marland Kitchen levothyroxine (SYNTHROID, LEVOTHROID) 25 MCG tablet Take 25 mcg by mouth daily.    . meclizine (ANTIVERT) 25 MG tablet Take 25 mg by mouth daily as needed.   0  . simvastatin (ZOCOR) 40 MG tablet TAKE 1 TABLET BY MOUTH EVERY NIGHT AT BEDTIME 30 tablet 5   No current facility-administered medications for this visit.   No Known Allergies History   Social History  . Marital Status: Married    Spouse Name: N/A  . Number of Children: 4  . Years of Education: Col Grad   Occupational History  . Retired    Social History Main Topics  . Smoking status: Never Smoker   . Smokeless tobacco: Never Used  . Alcohol Use: Yes     Comment: 1/2 glass wine daily  . Drug Use: No  . Sexual Activity: Not on file   Other Topics Concern  . Not on file   Social History Narrative   Caffeine Occ cup of coffee.    Review of Systems Full 14 system review of systems performed and notable only for those listed, all others are neg:  Constitutional:   Cardiovascular:  Ear/Nose/Throat:   Skin: Itching Eyes:   Respiratory:   Gastroitestinal:   Genitourinary:  Hematology/Lymphatic:   Endocrine:  Musculoskeletal:   Allergy/Immunology:   Neurological:  Memory loss, dizziness Psychiatric:  Sleep: Restless leg   Physical Exam  Filed  Vitals:   02/02/15 1320  BP: 157/76  Pulse: 61    General - Well nourished, well developed, in no apparent distress.  Ophthalmologic - Sharp disc margins OU.  Cardiovascular - Regular rate and rhythm with no murmur. Carotid pulses were 2+ without bruits .   Neck - supple, no nuchal rigidity .  Mental Status -  Level of arousal and orientation to time, place, and person were intact. Language including expression, naming, repetition, comprehension was assessed and found intact. Attention span and concentration were normal with calculation and backward spelling. Recent and  remote memory were intact, 3/3 registration and 3/3 delayed recall. Fund of Knowledge was assessed and was intact with previous presidents.  Cranial Nerves II - XII - II - Visual field intact OU. III, IV, VI - Extraocular movements intact. V - Facial sensation intact bilaterally. VII - Facial movement intact bilaterally. VIII - Hearing & vestibular intact bilaterally. X - Palate elevates symmetrically. XI - Chin turning & shoulder shrug intact bilaterally. XII - Tongue protrusion intact.  Motor Strength - The patient's strength was normal in all extremities and pronator drift was absent.  Bulk was normal and fasciculations were absent.   Motor Tone - Muscle tone was assessed at the neck and appendages and was normal.  Reflexes - The patient's reflexes were normal in all extremities and he had no pathological reflexes.  Sensory - Light touch, temperature/pinprick, vibration and proprioception, and Romberg testing were assessed and were normal.    Coordination - The patient had normal movements in the hands and feet with no ataxia or dysmetria.  Tremor was absent.  Gait and Station - mild stooped posturing, otherwise unremarkable.   Imaging  I have personally reviewed the radiological images below and agree with the radiology interpretations.  Not available  Lab Review Not available    Assessment and  Plan:   In summary, Jason Murphy is a 81 y.o. male with PMH of CAD s/p stent, processes cancer, OSA on CPAP and long-standing chronic dizziness disorder presents newly onset daily head heaviness in the setting of stress. His new symptoms including this vague symptoms which completely different from his dizziness spells. It most likely related to his recent stress over his wife compulsive buying disorder and financial crisis. Pt admit that this most likely his case. Neuro exam essentially normal. No need to change medications. He needs to solve underlying cause which means to deal with his wife psychiatric disorder. He is going to work on that. He is encouraged to be more relaxed and cope with stress accordingly.   No orders of the defined types were placed in this encounter.    Meds ordered this encounter  Medications  . meclizine (ANTIVERT) 25 MG tablet    Sig: Take 25 mg by mouth daily as needed.     Refill:  0    Patient Instructions  - continue current medications  - I think most likely your symptoms related to stress and stressful situations recently in your life. I recommend to improve your current situation and to decrease stress life. - I would not recommend mediation at this time but you need to work on the underlying causes - call us if there is more symptoms or problems - follow up as needed.   Rosalin Hawking, MD PhD [facility] [address], [place], [place] [address] [phone]

## 2015-02-03 DIAGNOSIS — F439 Reaction to severe stress, unspecified: Secondary | ICD-10-CM | POA: Insufficient documentation

## 2015-02-03 DIAGNOSIS — F411 Generalized anxiety disorder: Secondary | ICD-10-CM | POA: Insufficient documentation

## 2015-02-08 NOTE — Consult Note (Signed)
PATIENT NAME:  Jason Murphy, Jason Murphy MR#:  [id number] DATE OF BIRTH:  [birth year]  DATE OF CONSULTATION:  01/03/2012  REFERRING PHYSICIAN:  Phoebe Perch, MD  CONSULTING PHYSICIAN:  Lollie Sails, MD  REASON FOR CONSULTATION: Abdominal pain.   HISTORY OF PRESENT ILLNESS: Jason Murphy is very pleasant 81 year old Caucasian male who was admitted to the hospital very early this morning. He states that his problems with abdominal pain began this past Saturday morning when he awoke with pain on the right side of the abdomen. It seemed to increase with a deep breath. This increased Sunday when he went to Acute Care. He was sent from Acute Care to the ER at [facility]. He did not want to stay and went home. The abdominal pain continued and got worse and he came back on Monday morning and was admitted to the hospital. He has not had any nausea or vomiting, however, the pain increases with movement. If he takes a short sharp breath, it is quite tender for him. It is also tender when he presses on it. He has been taking some Vicodin at home. He does take Prevacid on a daily basis for reflux. He usually has a bowel movement once a day. There are no black stools, blood in the stools, or slimy stools. He has no personal history of peptic ulcer disease. His evaluation at [facility] included an abdominal ultrasound showing hepatic steatosis. He also had a hepatobiliary study that was normal and a gallbladder ejection fraction/CCK study this being markedly abnormal at 7%. He further had a CT scan showing sludge in the gallbladder. No evidence of choledocholithiasis or increased size of the common bile duct. He does have an infrarenal abdominal aortic aneurysm and evidence of diverticulosis. His last colonoscopy was 6 to 7 years ago in [place]. He did have regular colonoscopies prior to that. He does not believe he has had colon polyps in the past. It is of note that on the CT scan there was no abnormality noted of the hollow viscera.  Please note finding of gallbladder sludge, however. His LFTs have been normal.   GI FAMILY HISTORY: Negative for colorectal cancer, liver disease, or ulcers.   PAST MEDICAL HISTORY:  1. History of coronary artery disease with stents.  2. Hypothyroidism. 3. Hypertension. 4. Gastroesophageal reflux disease.  5. It is possible that he also has had prostate biopsies as he remembered something that sounded very much like this but does not endorse having history of prostate cancer.   SOCIAL HISTORY: He drinks a half glass of wine on a regular basis daily. His main activity is using his computer. He does not lift or carry heavy things.   REVIEW OF SYSTEMS: 10 systems reviewed per admission history and physical. This has been reviewed and agree with same.   PHYSICAL EXAMINATION:   VITAL SIGNS: Temperature 98.4, pulse 60, respirations 18, blood pressure 148/65, pulse oximetry 92%.   GENERAL: He is a 81 year old Caucasian male in no acute distress.   HEENT: Normocephalic, atraumatic.   EYES: Anicteric.   NOSE: Septum midline. No lesions.   OROPHARYNX: No lesions.   NECK: Supple. No JVD.   HEART: Regular rate and rhythm.   LUNGS: Bilaterally clear.   ABDOMEN: Soft. He is mildly tender to palpation in the right abdomen both above and somewhat below the umbilical line. However, when he takes a short sharp breath he is markedly tender in the region halfway between the umbilical line and the costal margin. After he does  this, palpation in this area does reproduce the pain. It is of note that on tensing the abdominal wall muscles he does not reproduce the pain. Bowel sounds positive, normoactive. There is no apparent organomegaly or masses felt.   RECTAL: Anorectal exam deferred.   EXTREMITIES: No clubbing, cyanosis, or edema.   NEUROLOGICAL: Cranial nerves II through XII grossly intact. Muscle strength bilaterally equal and symmetric.   LABORATORY, DIAGNOSTIC, AND RADIOLOGICAL DATA: On  the 17th he had a normal chem panel, normal hepatic profile, normal hemogram with the exception of the RBCs being slightly decreased at 4.30, however, his hemoglobin was 14.5 and hematocrit was 42.3. On the 18th at 21:56 hours his chemistry panel was normal, hepatic panel normal, lipase again normal. Hemogram normal again with the exception of the RBC. His urinalysis has been normal. He had an abdominal ultrasound in regards to right upper quadrant pain this showing no cholelithiasis or evidence of acute cholecystitis. There was evidence of hepatic steatosis. He had a CT scan of the abdomen and pelvis with contrast this showing high attenuation material dependently in the gallbladder likely representing gallbladder sludge. He has an infrarenal abdominal aortic aneurysm 4.1 x 4.0 cm, coronary artery disease, diverticulosis without evidence of diverticulitis, and evidence of hepatic steatosis. On his hepatobiliary scan there was a normal appearing hepatobiliary scan, however, the gallbladder ejection fraction was quite low at 7%.   ASSESSMENT: Atypical right upper quadrant pain. His pain is reproduced by sharp inspiration. There is not truly a Murphy sign, however, he is tender to palpation in this region markedly so after he has taken a sharp breath. CT scan was otherwise uninformative with the exception of the finding of gallbladder sludge. It is of note that he has a very poor ejection fraction on the CCK study. I feel it is quite likely that his abdominal pain is related to the biliary distention that he is having. My concern is that should the gallbladder sludge thicken or begin to crystallize the patient could end up with a pancreatitis or common duct obstruction complicating his clinical picture. Currently he is afebrile and does not show any evidence of acute toxicity or overt infection.   RECOMMENDATION: Case discussed with Dr. Burt Knack. Would recommend consideration of cholecystectomy. Will follow with  you.   Thank you for this consult.   ____________________________ Lollie Sails, MD mus:drc D: 01/03/2012 23:41:13 ET T: 01/04/2012 05:53:22 ET JOB#: [id number]  cc: Lollie Sails, MD, <Dictator> Lollie Sails MD ELECTRONICALLY SIGNED 01/10/2012 16:48

## 2015-02-08 NOTE — Consult Note (Signed)
Brief Consult Note: Diagnosis: possible intestinal angina.   Patient was seen by consultant.   Consult note dictated.   Recommend further assessment or treatment.   Discussed with Attending MD.   Comments: abdominal pain with neg w/u for GB disease,nonacidotic. suggests intestinal angina or other vascular etiology. Needs admission, hydration GI consult. Will follow.  Electronic Signatures: Florene Glen (MD)  (Signed 18-Mar-13 22:55)  Authored: Brief Consult Note   Last Updated: 18-Mar-13 22:55 by Florene Glen (MD)

## 2015-02-08 NOTE — Discharge Summary (Signed)
PATIENT NAME:  Jason Murphy, Jason Murphy MR#:  [id number] DATE OF BIRTH:  Apr 16, 1920  DATE OF ADMISSION:  01/03/2012 DATE OF DISCHARGE:  01/07/2012  BRIEF HISTORY: Jason Murphy is a 81 year old gentleman admitted to the medical service with what appeared to be symptomatic biliary tract disease. He was evaluated by the GI service who felt that his symptoms were consistent with biliary tract disease and surgical consultation recommended. Further work-up identified him as a moderate surgical risk. In discussing the situation with him in detail, he preferred to proceed with surgical intervention. Ultrasound did demonstrate multiple stones. After appropriate preoperative preparation and informed consent, he was taken to surgery on the morning of 01/04/2012 where he underwent laparoscopic cholecystectomy with cholangiography. The procedure was uncomplicated. No significant postoperative problems. He had slow return of bowel function but by the second hospital day was able to get up and around and was discharged home on the 24th.   DISCHARGE MEDICATIONS:  1. Aspirin 81 mg p.o. daily.  2. Synthroid 0.025 mg p.o. daily.  3. Simvastatin 40 mg p.o. daily.  4. Metoprolol 25 mg p.o. daily.  5. Vicodin for pain.   FINAL DISCHARGE DIAGNOSIS: Chronic cholecystitis and cholelithiasis.   SURGERY: Laparoscopic cholecystectomy.   ____________________________ Rodena Goldmann III, MD rle:drc D: 01/12/2012 15:41:02 ET T: 01/14/2012 15:06:42 ET JOB#: [id number]  cc: Rodena Goldmann III, MD, <Dictator> Rodena Goldmann MD ELECTRONICALLY SIGNED 01/15/2012 20:29

## 2015-02-08 NOTE — Consult Note (Signed)
Chief Complaint:   Subjective/Chief Complaint Patietn currently in surgery.  Will fu tomorrow.   VITAL SIGNS/ANCILLARY NOTES: **Vital Signs.:   20-Mar-13 09:08   Vital Signs Type Pre Medication   Temperature Temperature (F) 97.3   Celsius 36.2   Temperature Source oral   Pulse Pulse 64   Pulse source radial   Respirations Respirations 20   Systolic BP Systolic BP 563   Diastolic BP (mmHg) Diastolic BP (mmHg) 65   Mean BP 90   BP Source manual   Pulse Ox % Pulse Ox % 95   Pulse Ox Activity Level  At rest   Oxygen Delivery Room Air/ 21 %   Electronic Signatures: Loistine Simas (MD)  (Signed 20-Mar-13 12:55)  Authored: Chief Complaint, VITAL SIGNS/ANCILLARY NOTES   Last Updated: 20-Mar-13 12:55 by Loistine Simas (MD)

## 2015-02-08 NOTE — H&P (Signed)
PATIENT NAME:  Jason Murphy, Jason Murphy MR#:  [id number] DATE OF BIRTH:  08/09/1920  DATE OF ADMISSION:  01/03/2012  REFERRING PHYSICIAN: Dr. Woodruff PRIMARY CARE PHYSICIAN: Dr. Bliss   REASON FOR ADMISSION: Persistent abdominal pain, elevated lactic acid.   HISTORY OF PRESENT ILLNESS: This is a 81-year-old white male with past medical history of hypertension, hyperlipidemia, coronary artery disease status post stents who presents to the ER on 01/01/2012 and 01/02/2012 with right upper quadrant abdominal pain with deep breathing and movement is 10/10. He said it began on 12/31/2011. He said it persisted throughout the day intermittently with movement. He said he had some mild loose stools with it and nausea. He has had no chest pain, shortness of breath. On 03/17 it still persisted and he came to the ER. He had labs drawn. Abdominal ultrasound showed no cholecystitis. He had infrarenal abdominal aortic aneurysm noted 4.1 x 4 cm. Hepatic steatosis, diverticulosis without diverticulitis. He was referred for admission, however, patient did not stay. He returned on 01/02/2012 worsened pain, had labs drawn. He was given morphine. He was found to have elevated lactic acid. Seen by surgery who thought he could have intestinal angina from possible ischemic colitis. We are asked to admit the patient for persistent right upper quadrant abdominal pain.    PAST MEDICAL HISTORY:  1. Coronary artery disease status post stents.  2. Hypothyroidism.  3. Hypertension. 4. Gastroesophageal reflux disease.   DRUG ALLERGIES: No known drug allergies.   PAST SURGICAL HISTORY: None.   FAMILY HISTORY: Mother lived to age 85. Father lived to 82 and died of coronary artery disease and prostate cancer. Mother also had coronary artery disease.   SOCIAL HISTORY: He is married with four healthy children. He drinks half a glass of wine a day. Does not use drugs. He still works in computer office work. Originally from [place], [place]. Been living here since the 1996.    REVIEW OF SYSTEMS: CONSTITUTIONAL: No fever, fatigue. He does have weakness. EYES: No blurred vision, double vision, pain, redness, inflammation, glaucoma. He does wear glasses. ENT: No tinnitus, ear pain, hearing loss, seasonal allergies, epistaxis, discharge. RESPIRATORY: No cough, wheezing, hemoptysis, dyspnea, asthma, painful respirations. CARDIOVASCULAR: No chest pain, orthopnea, edema, arrhythmias, dyspnea on exertion, palpitations. GASTROINTESTINAL: Some nausea but no vomiting. He had loose stools. He has abdominal pain. No hematemesis, melena, gastroesophageal reflux disease. GENITOURINARY: No dysuria, hematuria, renal calculi, increased frequency, incontinence. GU/MALE: No sores, discharge, prostatitis. ENDOCRINE: No polyuria, nocturia, thyroid problems, increased sweating, heat or cold intolerance. HEME/LYMPH: No anemia, easy bruising, swollen glands. INTEGUMENT: No acne, rash, change in mole, hair, or skin. MUSCULOSKELETAL: No pain in back, shoulder, knee, hip, arthritis, swelling, gout. NEUROLOGIC: No numbness, weakness, dysarthria, epilepsy, tremor, vertigo ataxia. PSYCH: No anxiety, insomnia, ADD, bipolar, depression.   PHYSICAL EXAMINATION:  VITAL SIGNS: Temperature 97.4, heart rate 69, blood pressure 134/63, sating 94% on room air, respiratory rate 22.   GENERAL: Patient is well developed, no apparent distress, looks younger than stated age.   HEENT: Pupils equal, reactive to light and accommodation. Extraocular movements intact. Anicteric sclerae. No difficulty hearing. Oropharynx clear.   NECK: No JVD. No thyromegaly. No lymphadenopathy. No carotid bruits.   LUNGS: Clear to auscultation. Resonant to percussion. No increased effort or use of accessory muscles.   CARDIOVASCULAR: Regular rate and rhythm. Normal S1. No murmurs, gallops, rubs appreciated. No lower extremity edema. 2+ dorsalis pedis pulses. PMI not lateralized.   BREASTS: No  obvious masses.   ABDOMEN:   Soft, nontender, nondistended. Positive bowel sounds. Normal bowel sounds. No organomegaly. No umbilical hernia. He does on deep palpation and when he takes a deep breath has a positive Murphy's sign in right upper quadrant pain there.   GENITOURINARY: Deferred.   MUSCULOSKELETAL: Strength 5/5. No clubbing, cyanosis, degenerative joint disease.   SKIN: No rashes, lesions, induration.   LYMPH: No lymphadenopathy in cervical or supraclavicular area.   NEUROLOGIC: Cranial nerves II through XII are intact. Does follow commands. No dysarthria, aphasia, dysphagia, contractures. Strength 5/5.   PSYCH: Alert and oriented x3.   LABORATORY, DIAGNOSTIC AND RADIOLOGICAL DATA: Urinalysis shows WBC 1, bacteria none, leukocyte esterase negative. WBC 10.1, hemoglobin 14.4, hematocrit 42.6, platelets 278, glucose 80, BUN 15, creatinine 1.03, sodium 144, potassium 2.7, chloride 103, bicarbonate 28, total bilirubin 0.7, alkaline phosphatase 69, ALT 27, AST 29, albumin 3.9, lipase 94. Lactic acid was 1.8 and now down to 1.2. CT scan showed infrarenal abdominal aortic aneurysm 4.1 x 4 cm, hepatic steatosis, diverticulosis without diverticulitis. Chest x-ray shows no acute disease of the chest. Ultrasound of the abdomen 3/17 showed no cholelithiasis or sonographic evidence of acute cholecystitis. Hepatic steatosis.   HOSPITAL COURSE: This is a 81 year old white male with past medical history of coronary artery disease status post stent, hypothyroidism presents with right upper quadrant abdominal pain.  1. Abdominal pain. Surgery consulted and felt the etiology was ischemic colitis/intestinal angina. Will send off CRP, ESR. Start the patient on antibiotics in the form of Cipro and Flagyl. Will give p.r.n. morphine. Continue aspirin. If truly is ischemic colitis then consult GI. Will repeat lactic acid in the morning. Patient may need another ultrasound in the form of HIDA scan as I still think  the pain is localized to the right upper quadrant and gallbladder.  2. Hypertension. This is poorly controlled initially most likely due to pain. Will continue metoprolol and p.r.n. hydralazine. 3. Coronary artery disease. Will continue with aspirin, statin, metoprolol.  4. Hypothyroidism. Patient has not know the dose of his medication. Will have his wife call in the dose.  5. Gastroesophageal reflux disease. Will continue Protonix.  6. Obstructive sleep apnea. Will allow patient to use CPAP at night.  7. Deep vein thrombosis prophylaxis. Maintain with aspirin and Lovenox.  8.   Aortic Abdominal Anneurysm.  Patient should get yearly surveillance ultrasounds. 8. CODE STATUS: FULL CODE.   TOTAL TIME SPENT ON ADMISSION: 55 minutes.   Thank you for allowing me to participate in the care of this patient blaze.   ____________________________ Judeth Horn. Royden Purl, MD aaf:cms D: 01/03/2012 01:38:00 ET T: 01/03/2012 07:10:17 ET JOB#: [id number]  cc: Mike Craze A. Royden Purl, MD, <Dictator> Valetta Close, MD Jerrol Banana. Burt Knack, MD Joaquin Bend MD ELECTRONICALLY SIGNED 01/03/2012 21:27

## 2015-02-08 NOTE — Consult Note (Signed)
Brief Consult Note: Diagnosis: abdominal pain.   Patient was seen by consultant.   Consult note dictated.   Recommend to proceed with surgery or procedure.   Recommend further assessment or treatment.   Discussed with Attending MD.   Comments: Please see full GI consult [phone].  Patient admitted woth atypical right abd pain.  Normal labs, however poor GB ef of only 7 percent and finding on ct of gallbladder sludge.  Pain is mostly after a sharp inspiration, not in the chest but the lower ruq.  Recommend consideration of CCY.  Discussed with Dr Alfonso Patten. Cooper.  Electronic Signatures: Loistine Simas (MD)  (Signed 19-Mar-13 23:44)  Authored: Brief Consult Note   Last Updated: 19-Mar-13 23:44 by Loistine Simas (MD)

## 2015-02-08 NOTE — Op Note (Signed)
PATIENT NAME:  Jason Murphy, Jason Murphy MR#:  [id number] DATE OF BIRTH:  26-Apr-1920  DATE OF PROCEDURE:  01/04/2012  PREOPERATIVE DIAGNOSIS: Cholecystitis.   POSTOPERATIVE DIAGNOSIS:  Cholecystitis.   OPERATION: Laparoscopic cholecystectomy with cholangiography.   SURGEON: Rodena Goldmann, MD   ANESTHESIA: General.   OPERATIVE PROCEDURE: With the patient in the supine position after the induction of appropriate general anesthesia, the patient's abdomen was prepped with Betadine and draped in sterile towels. The patient was placed in the head down, feet up position. A small infraumbilical incision was made in the standard fashion, carried down bluntly through the subcutaneous tissue. A Veress needle was used to cannulate the peritoneal cavity. CO2 was insufflated to appropriate pressure measurements. When approximately 2.5 liters of CO2 were instilled, the Veress needle was withdrawn. An 11-mm [employer] port was inserted into the peritoneal cavity. Intraperitoneal position was confirmed and CO2 was re-insufflated. There were multiple adhesions over the gallbladder with evidence of inflammatory edema extending to the anterior abdominal wall. The patient was placed head up, feet down position and rotated slightly to the left side. A subxiphoid transverse incision was made and an 11-mm port was inserted under direct vision. Two lateral ports 5-mm in size were inserted under direct vision. The gallbladder was distended. It was emptied of 50 mL of dirty, motor-oil-colored bile, and then was grasped and retracted superiorly and laterally. The hepatoduodenal ligament was identified. The cystic artery and cystic duct were identified. The cystic duct was clipped on the gallbladder side and opened. An on-table cholangiogram using dynamic fluoroscopy revealed free flow of dye into the duodenum. Intrahepatic radicles were seen. No obstruction was identified. The catheter was withdrawn. The cystic duct was doubly clipped on  the common duct side and divided. The cystic artery was doubly clipped and divided. The gallbladder was then dissected free from its bed and delivered using hook and cautery apparatus. Once the gallbladder was free, the camera was switched to the subxiphoid port and the gallbladder was brought out through the umbilical port using the Endo Catch apparatus. The midline defect was closed with a single suture of 0 Vicryl using the suture passer. The abdomen was then copiously irrigated and desufflated. Skin incisions were closed with 5-0 nylon. 0.25% Marcaine was injected for postoperative pain control. Sterile dressings were applied. The patient was returned to the recovery room having tolerated the procedure well. Sponge, instrument, and needle counts were correct times two in the operating room.    ____________________________ Rodena Goldmann III, MD rle:bjt D: 01/04/2012 14:17:14 ET T: 01/04/2012 16:46:10 ET JOB#: [id number]  cc: Micheline Maze, MD, <Dictator> Lollie Sails, MD Rodena Goldmann MD ELECTRONICALLY SIGNED 01/05/2012 14:57

## 2015-02-08 NOTE — Consult Note (Signed)
Brief Consult Note: Diagnosis: abdominal pain.   Comments: Came to see patient who is currently not in room, will retry later.  Electronic Signatures: Stephens November H (NP)  (Signed 19-Mar-13 15:17)  Authored: Brief Consult Note   Last Updated: 19-Mar-13 15:17 by Theodore Demark (NP)

## 2015-02-08 NOTE — Consult Note (Signed)
PATIENT NAME:  Jason Murphy, Jason Murphy MR#:  [id number] DATE OF BIRTH:  04-18-1920  DATE OF CONSULTATION:  01/02/2012  REFERRING PHYSICIAN:   CONSULTING PHYSICIAN:  Jerrol Banana. Burt Knack, MD  CHIEF COMPLAINT: Right upper quadrant pain.   HISTORY OF PRESENT ILLNESS: This is a 81 year old Caucasian male patient. He was actually in the Emergency Room the night before and I had spoken to the ER physician, Dr. Jasmine December, at that time who had found that he had essentially normal labs. His lactate level was slightly elevated at 1.8 but his bicarbonate was normal. He had a complete work-up for his gallbladder which was completely normal. The patient was offered admission at that time but declined and went home instead. He returns on 03/18 with the same abdominal pain that is no worse than it had been before but has been constant. He has had no nausea or vomiting and no chest pain or shortness of breath. This has been going on for several days now. The initial thought process was that the patient may have intestinal angina or some chronic vascular deficiency of the small bowel because of his history of coronary artery disease, etc.   PAST MEDICAL HISTORY:  1. Coronary artery disease. He has stents in place.  2. Hypothyroidism.  3. Hypertension. 4. Reflux disease.   ALLERGIES: None.   MEDICATIONS: Multiple, see chart.   FAMILY HISTORY: Noncontributory.   SOCIAL HISTORY: Drinks alcohol daily but minimally. He does not smoke.   REVIEW OF SYSTEMS: 10-system review was performed and negative with the exception of that mentioned in the history of present illness.     PHYSICAL EXAMINATION:  GENERAL: Healthy, comfortable -appearing Caucasian male patient.  VITAL SIGNS: Afebrile at 98.6, pulse 68, respirations 24, blood pressure 134/63, and 93% room air saturation.   HEENT: No scleral icterus.   NECK: No palpable neck nodes.   CHEST: Clear to auscultation.   CARDIAC: Regular rate and rhythm.   ABDOMEN: Soft  and minimally tender in the right upper quadrant with a negative Murphy's sign. No CVA tenderness.   EXTREMITIES: Without edema. Calves are nontender.   NEUROLOGIC: Grossly intact.   INTEGUMENT: No jaundice.   LABORATORY, DIAGNOSTIC, AND RADIOLOGICAL DATA:   An ultrasound done on 03/17 showed no stones or sludge and no pericholecystic fluid. Laboratory values were within normal limits. White blood cell count was 10.2, hemoglobin and hematocrit of 14 and 42, platelet count 258, lipase 101. A CT scan demonstrated hepatic steatosis, diverticulosis, and a 4.1-cm abdominal aortic aneurysm. The CT scan also suggested sludge. This was not confirmed on ultrasound.   ASSESSMENT AND PLAN: This is a patient with abdominal pain of unclear etiology. Although this could be gallbladder related, there is no hard evidence for that at this time. The patient is going to be admitted to the hospital where a GI consult will be obtained as well and I have discussed this with the patient and his family members.      The rationale for following him in the hospital was discussed and surgical intervention will be available if needed. He may even benefit from a vascular consultation.     ____________________________ Jerrol Banana Burt Knack, MD rec:bjt D: 01/03/2012 23:38:00 ET T: 01/04/2012 10:25:23 ET JOB#: [id number]  cc: Jerrol Banana. Burt Knack, MD, <Dictator> Florene Glen MD ELECTRONICALLY SIGNED 01/05/2012 6:59

## 2015-12-02 DIAGNOSIS — G4733 Obstructive sleep apnea (adult) (pediatric): Secondary | ICD-10-CM | POA: Diagnosis not present

## 2015-12-02 DIAGNOSIS — G4761 Periodic limb movement disorder: Secondary | ICD-10-CM | POA: Diagnosis not present

## 2015-12-24 DIAGNOSIS — L821 Other seborrheic keratosis: Secondary | ICD-10-CM | POA: Diagnosis not present

## 2015-12-24 DIAGNOSIS — L281 Prurigo nodularis: Secondary | ICD-10-CM | POA: Diagnosis not present

## 2016-01-03 ENCOUNTER — Emergency Department: Payer: PPO

## 2016-01-03 ENCOUNTER — Encounter: Payer: Self-pay | Admitting: Medical Oncology

## 2016-01-03 ENCOUNTER — Inpatient Hospital Stay
Admission: EM | Admit: 2016-01-03 | Discharge: 2016-01-07 | DRG: 640 | Disposition: A | Discharge status: Home / Self Care | Payer: PPO | Attending: Internal Medicine | Admitting: Internal Medicine

## 2016-01-03 DIAGNOSIS — I10 Essential (primary) hypertension: Secondary | ICD-10-CM | POA: Diagnosis not present

## 2016-01-03 DIAGNOSIS — I4891 Unspecified atrial fibrillation: Secondary | ICD-10-CM | POA: Diagnosis not present

## 2016-01-03 DIAGNOSIS — R0602 Shortness of breath: Secondary | ICD-10-CM

## 2016-01-03 DIAGNOSIS — I509 Heart failure, unspecified: Secondary | ICD-10-CM

## 2016-01-03 DIAGNOSIS — N390 Urinary tract infection, site not specified: Secondary | ICD-10-CM | POA: Diagnosis not present

## 2016-01-03 DIAGNOSIS — I447 Left bundle-branch block, unspecified: Secondary | ICD-10-CM | POA: Diagnosis not present

## 2016-01-03 DIAGNOSIS — R748 Abnormal levels of other serum enzymes: Secondary | ICD-10-CM | POA: Diagnosis not present

## 2016-01-03 DIAGNOSIS — R079 Chest pain, unspecified: Secondary | ICD-10-CM | POA: Diagnosis not present

## 2016-01-03 DIAGNOSIS — J811 Chronic pulmonary edema: Secondary | ICD-10-CM | POA: Diagnosis not present

## 2016-01-03 DIAGNOSIS — E860 Dehydration: Secondary | ICD-10-CM | POA: Diagnosis not present

## 2016-01-03 DIAGNOSIS — R0902 Hypoxemia: Secondary | ICD-10-CM

## 2016-01-03 DIAGNOSIS — J9601 Acute respiratory failure with hypoxia: Secondary | ICD-10-CM | POA: Diagnosis present

## 2016-01-03 DIAGNOSIS — J9691 Respiratory failure, unspecified with hypoxia: Secondary | ICD-10-CM | POA: Diagnosis present

## 2016-01-03 DIAGNOSIS — Z955 Presence of coronary angioplasty implant and graft: Secondary | ICD-10-CM | POA: Diagnosis not present

## 2016-01-03 DIAGNOSIS — B9562 Methicillin resistant Staphylococcus aureus infection as the cause of diseases classified elsewhere: Secondary | ICD-10-CM | POA: Diagnosis not present

## 2016-01-03 DIAGNOSIS — Z7982 Long term (current) use of aspirin: Secondary | ICD-10-CM | POA: Diagnosis not present

## 2016-01-03 DIAGNOSIS — I2510 Atherosclerotic heart disease of native coronary artery without angina pectoris: Secondary | ICD-10-CM | POA: Diagnosis not present

## 2016-01-03 DIAGNOSIS — E871 Hypo-osmolality and hyponatremia: Secondary | ICD-10-CM | POA: Diagnosis not present

## 2016-01-03 DIAGNOSIS — I248 Other forms of acute ischemic heart disease: Secondary | ICD-10-CM | POA: Diagnosis not present

## 2016-01-03 DIAGNOSIS — I5031 Acute diastolic (congestive) heart failure: Secondary | ICD-10-CM | POA: Diagnosis present

## 2016-01-03 DIAGNOSIS — N179 Acute kidney failure, unspecified: Secondary | ICD-10-CM | POA: Diagnosis present

## 2016-01-03 DIAGNOSIS — R778 Other specified abnormalities of plasma proteins: Secondary | ICD-10-CM

## 2016-01-03 DIAGNOSIS — J189 Pneumonia, unspecified organism: Secondary | ICD-10-CM | POA: Diagnosis present

## 2016-01-03 DIAGNOSIS — R509 Fever, unspecified: Secondary | ICD-10-CM | POA: Diagnosis present

## 2016-01-03 DIAGNOSIS — R531 Weakness: Secondary | ICD-10-CM

## 2016-01-03 DIAGNOSIS — E039 Hypothyroidism, unspecified: Secondary | ICD-10-CM | POA: Diagnosis not present

## 2016-01-03 DIAGNOSIS — R7989 Other specified abnormal findings of blood chemistry: Secondary | ICD-10-CM | POA: Diagnosis not present

## 2016-01-03 LAB — COMPREHENSIVE METABOLIC PANEL
ALT: 39 U/L (ref 17–63)
AST: 72 U/L — ABNORMAL HIGH (ref 15–41)
Albumin: 3.5 g/dL (ref 3.5–5.0)
Alkaline Phosphatase: 93 U/L (ref 38–126)
Anion gap: 12 (ref 5–15)
BUN: 33 mg/dL — ABNORMAL HIGH (ref 6–20)
CO2: 16 mmol/L — AB (ref 22–32)
CREATININE: 1.66 mg/dL — AB (ref 0.61–1.24)
Calcium: 8.2 mg/dL — ABNORMAL LOW (ref 8.9–10.3)
Chloride: 102 mmol/L (ref 101–111)
GFR calc non Af Amer: 34 mL/min — ABNORMAL LOW (ref >60)
GFR, EST AFRICAN AMERICAN: 39 mL/min — AB (ref >60)
Glucose, Bld: 139 mg/dL — ABNORMAL HIGH (ref 65–99)
POTASSIUM: 4.2 mmol/L (ref 3.5–5.1)
SODIUM: 130 mmol/L — AB (ref 135–145)
Total Bilirubin: 0.9 mg/dL (ref 0.3–1.2)
Total Protein: 7.5 g/dL (ref 6.5–8.1)

## 2016-01-03 LAB — CBC WITH DIFFERENTIAL/PLATELET
BASOS PCT: 0 %
Basophils Absolute: 0 10*3/uL (ref 0–0.1)
EOS ABS: 0 10*3/uL (ref 0–0.7)
EOS PCT: 0 %
HCT: 42.1 % (ref 40.0–52.0)
Hemoglobin: 14.3 g/dL (ref 13.0–18.0)
LYMPHS ABS: 0.3 10*3/uL — AB (ref 1.0–3.6)
Lymphocytes Relative: 4 %
MCH: 32.1 pg (ref 26.0–34.0)
MCHC: 34 g/dL (ref 32.0–36.0)
MCV: 94.6 fL (ref 80.0–100.0)
Monocytes Absolute: 0.4 10*3/uL (ref 0.2–1.0)
Monocytes Relative: 4 %
Neutro Abs: 8.2 10*3/uL — ABNORMAL HIGH (ref 1.4–6.5)
Neutrophils Relative %: 92 %
PLATELETS: 246 10*3/uL (ref 150–440)
RBC: 4.45 MIL/uL (ref 4.40–5.90)
RDW: 13.5 % (ref 11.5–14.5)
WBC: 8.9 10*3/uL (ref 3.8–10.6)

## 2016-01-03 LAB — TROPONIN I
TROPONIN I: 0.1 ng/mL — AB (ref <0.031)
Troponin I: 0.11 ng/mL — ABNORMAL HIGH (ref <0.031)
Troponin I: 0.1 ng/mL — ABNORMAL HIGH (ref <0.031)

## 2016-01-03 LAB — LACTIC ACID, PLASMA
LACTIC ACID, VENOUS: 3.4 mmol/L — AB (ref 0.5–2.0)
Lactic Acid, Venous: 2.5 mmol/L (ref 0.5–2.0)

## 2016-01-03 LAB — RAPID INFLUENZA A&B ANTIGENS
Influenza A (ARMC): NEGATIVE
Influenza B (ARMC): NEGATIVE

## 2016-01-03 LAB — LIPASE, BLOOD: Lipase: 18 U/L (ref 11–51)

## 2016-01-03 LAB — TSH: TSH: 6.115 u[IU]/mL — ABNORMAL HIGH (ref 0.350–4.500)

## 2016-01-03 MED ORDER — LEVOTHYROXINE SODIUM 25 MCG PO TABS
25.0000 ug | ORAL_TABLET | Freq: Every day | ORAL | Status: DC
  Administered 2016-01-04 – 2016-01-05 (×2): 25 ug via ORAL
  Filled 2016-01-03 (×2): qty 1

## 2016-01-03 MED ORDER — SODIUM CHLORIDE 0.9 % IV SOLN
1000.0000 mL | Freq: Once | INTRAVENOUS | Status: AC
  Administered 2016-01-03: 1000 mL via INTRAVENOUS

## 2016-01-03 MED ORDER — SENNOSIDES-DOCUSATE SODIUM 8.6-50 MG PO TABS: 1.0000 | ORAL_TABLET | Freq: Every evening | ORAL | Status: DC | PRN

## 2016-01-03 MED ORDER — ONDANSETRON HCL 4 MG/2ML IJ SOLN: 4.0000 mg | Freq: Four times a day (QID) | INTRAMUSCULAR | Status: DC | PRN

## 2016-01-03 MED ORDER — ASPIRIN 81 MG PO TABS: 81.0000 mg | ORAL_TABLET | Freq: Every day | ORAL | Status: DC

## 2016-01-03 MED ORDER — PANTOPRAZOLE SODIUM 40 MG PO TBEC
40.0000 mg | DELAYED_RELEASE_TABLET | Freq: Every day | ORAL | Status: DC
  Administered 2016-01-03 – 2016-01-07 (×5): 40 mg via ORAL
  Filled 2016-01-03 (×5): qty 1

## 2016-01-03 MED ORDER — ASPIRIN EC 81 MG PO TBEC
81.0000 mg | DELAYED_RELEASE_TABLET | Freq: Every day | ORAL | Status: DC
  Administered 2016-01-03 – 2016-01-06 (×4): 81 mg via ORAL
  Filled 2016-01-03 (×5): qty 1

## 2016-01-03 MED ORDER — ACETAMINOPHEN 650 MG RE SUPP: 650.0000 mg | Freq: Four times a day (QID) | RECTAL | Status: DC | PRN

## 2016-01-03 MED ORDER — SIMVASTATIN 40 MG PO TABS
40.0000 mg | ORAL_TABLET | Freq: Every day | ORAL | Status: DC
  Administered 2016-01-03 – 2016-01-06 (×4): 40 mg via ORAL
  Filled 2016-01-03 (×4): qty 1

## 2016-01-03 MED ORDER — ONDANSETRON HCL 4 MG PO TABS: 4.0000 mg | ORAL_TABLET | Freq: Four times a day (QID) | ORAL | Status: DC | PRN

## 2016-01-03 MED ORDER — SODIUM CHLORIDE 0.9 % IV SOLN
INTRAVENOUS | Status: DC
  Administered 2016-01-03 – 2016-01-04 (×2): via INTRAVENOUS

## 2016-01-03 MED ORDER — IBUPROFEN 600 MG PO TABS
600.0000 mg | ORAL_TABLET | Freq: Once | ORAL | Status: AC
  Administered 2016-01-03: 600 mg via ORAL

## 2016-01-03 MED ORDER — B COMPLEX-C PO TABS
1.0000 | ORAL_TABLET | Freq: Every day | ORAL | Status: DC
  Administered 2016-01-05: 1 via ORAL
  Filled 2016-01-03 (×5): qty 1

## 2016-01-03 MED ORDER — ENOXAPARIN SODIUM 40 MG/0.4ML ~~LOC~~ SOLN
40.0000 mg | SUBCUTANEOUS | Status: DC
Start: 2016-01-03 — End: 2016-01-07
  Administered 2016-01-03 – 2016-01-06 (×4): 40 mg via SUBCUTANEOUS
  Filled 2016-01-03 (×4): qty 0.4

## 2016-01-03 MED ORDER — CO-ENZYME Q-10 50 MG PO CAPS: 50.0000 mg | ORAL_CAPSULE | Freq: Every day | ORAL | Status: DC

## 2016-01-03 MED ORDER — SODIUM CHLORIDE 0.9% FLUSH
3.0000 mL | Freq: Two times a day (BID) | INTRAVENOUS | Status: DC
  Administered 2016-01-04 – 2016-01-06 (×5): 3 mL via INTRAVENOUS

## 2016-01-03 MED ORDER — DEXTROSE 5 % IV SOLN
1.0000 g | INTRAVENOUS | Status: DC
  Administered 2016-01-03: 1 g via INTRAVENOUS
  Filled 2016-01-03 (×2): qty 10

## 2016-01-03 MED ORDER — MECLIZINE HCL 25 MG PO TABS
25.0000 mg | ORAL_TABLET | Freq: Two times a day (BID) | ORAL | Status: DC | PRN
  Filled 2016-01-03: qty 1

## 2016-01-03 MED ORDER — LANSOPRAZOLE 30 MG PO CPDR: 30.0000 mg | DELAYED_RELEASE_CAPSULE | Freq: Every day | ORAL | Status: DC

## 2016-01-03 MED ORDER — ACETAMINOPHEN 325 MG PO TABS
650.0000 mg | ORAL_TABLET | Freq: Four times a day (QID) | ORAL | Status: DC | PRN
  Administered 2016-01-03: 650 mg via ORAL
  Filled 2016-01-03: qty 2

## 2016-01-03 MED ORDER — ALUM & MAG HYDROXIDE-SIMETH 200-200-20 MG/5ML PO SUSP
30.0000 mL | Freq: Four times a day (QID) | ORAL | Status: DC | PRN
  Administered 2016-01-03: 30 mL via ORAL
  Filled 2016-01-03: qty 30

## 2016-01-03 NOTE — Progress Notes (Signed)
Patient temp 100.3 at 1700 acetaminophen given. Temp at 1800 100.8 dr sparks notified orders for 600mg  of ibuprofen given.

## 2016-01-03 NOTE — ED Notes (Signed)
Pt reports for the past 10 days he has been having intermittent fever for 10 days, was diagnosed with UTI by pcp and placed on antibiotic, pt started feeling better so he stopped taking them, then he began feeling bad again so restarted them.

## 2016-01-03 NOTE — Progress Notes (Signed)
Pharmacy Antibiotic Note  Jason Murphy is a 82 y.o. male admitted on 01/03/2016 with UTI, failed outpatient treatment with Bactrim.  Pharmacy has been consulted for Ceftriaxone dosing.  Plan: Ceftriaxone 1g IV q24h  Height: 5\' 10"  (177.8 cm) Weight: 195 lb (88.451 kg) IBW/kg (Calculated) : 73  Temp (24hrs), Avg:98.9 F (37.2 C), Min:98.9 F (37.2 C), Max:98.9 F (37.2 C)   Recent Labs Lab 01/03/16 1244  WBC 8.9  CREATININE 1.66*  LATICACIDVEN 3.4*    Estimated Creatinine Clearance: 30.5 mL/min (by C-G formula based on Cr of 1.66).    No Known Allergies  Antimicrobials this admission: Ceftriaxone 3/19 >>   Dose adjustments this admission:  Microbiology results:  Thank you for allowing pharmacy to be a part of this patient's care.  Paulina Fusi, PharmD, BCPS 01/03/2016 4:32 PM

## 2016-01-03 NOTE — ED Provider Notes (Signed)
[facility] Emergency Department Provider Note  ____________________________________________    I have reviewed the triage vital signs and the nursing notes.   HISTORY  Chief Complaint Fever and Urinary Tract Infection    HPI Jason Murphy is a 82 y.o. male presents with complaints of fevers chills and shaking. Patient reports he was diagnosed with UTI at the beginning of the month and took antibiotics and seemed to be feeling better. But recently he has started feeling fatigued, with chills that are described as rigors. He denies cough or shortness of breath. He is quite active for his age and walks around the block for approximately 20 minutes a day. Family reports he has been more short of breath. No recent travel. No calf pain or swelling. No rash. No recent tooth extraction     Past Medical History  Diagnosis Date  . Coronary artery disease   . Dizziness     chronic dizziness  . Pneumonia   . Hypercholesterolemia   . Hypertension   . Obstructive sleep apnea   . Cancer [facility])     prostate  . Fatigue     Progressive fatigue with question of sleep apnea  . Dyspnea     Patient Active Problem List   Diagnosis Date Noted  . Anxiety state 02/03/2015  . Stress at home 02/03/2015  . OBSTRUCTIVE SLEEP APNEA 10/22/2010  . PROSTATE CANCER 10/21/2010  . HYPERCHOLESTEROLEMIA 10/21/2010  . HYPERTENSION 10/21/2010  . CORONARY ARTERY DISEASE 10/21/2010  . DIZZINESS, CHRONIC 10/21/2010    Past Surgical History  Procedure Laterality Date  . Appendectomy    . Knee surgery      arthroscopic left knee surgery  . Shoulder arthroscopy    . Cardiac catheterization      Ejection Fraction was 60%, stent    Current Outpatient Rx  Name  Route  Sig  Dispense  Refill  . aspirin 81 MG tablet   Oral   Take 81 mg by mouth daily.           Marland Kitchen b complex vitamins tablet   Oral   Take 1 tablet by mouth daily.           Marland Kitchen co-enzyme Q-10 50 MG capsule  Oral   Take 50 mg by mouth daily.           . Lansoprazole (PREVACID 24HR PO)   Oral   Take 2 capsules by mouth daily.           Marland Kitchen levothyroxine (SYNTHROID, LEVOTHROID) 25 MCG tablet   Oral   Take 25 mcg by mouth daily.         . meclizine (ANTIVERT) 25 MG tablet   Oral   Take 25 mg by mouth daily as needed.       0   . simvastatin (ZOCOR) 40 MG tablet      TAKE 1 TABLET BY MOUTH EVERY NIGHT AT BEDTIME   30 tablet   5     Allergies Review of patient's allergies indicates no known allergies.  Family History  Problem Relation Age of Onset  . COPD Mother     Social History Social History  Substance Use Topics  . Smoking status: Never Smoker   . Smokeless tobacco: Never Used  . Alcohol Use: Yes     Comment: 1/2 glass wine daily    Review of Systems  Constitutional: Positive for chills Eyes: Negative for redness ENT: Negative for recent dental extraction Cardiovascular: Negative  for chest pain Respiratory: Negative for shortness of breath. No cough Gastrointestinal: Negative for abdominal pain Genitourinary: Negative for dysuria. Dark urine Musculoskeletal: Negative for joint pain Skin: Negative for rash. Neurological: Negative for focal weakness Psychiatric: no anxiety    ____________________________________________   PHYSICAL EXAM:  VITAL SIGNS: ED Triage Vitals  Enc Vitals Group     BP 01/03/16 1236 130/85 mmHg     Pulse Rate 01/03/16 1236 82     Resp 01/03/16 1236 20     Temp 01/03/16 1233 98.9 F (37.2 C)     Temp Source 01/03/16 1233 Oral     SpO2 01/03/16 1236 95 %     Weight 01/03/16 1233 195 lb (88.451 kg)     Height 01/03/16 1233 5\' 10"  (1.778 m)     Head Cir --      Peak Flow --      Pain Score --      Pain Loc --      Pain Edu? --      Excl. in [place]? --      Constitutional: Alert and oriented. Well appearing and in no distress. Pleasant and interactive Eyes: Conjunctivae are normal. No erythema or injection ENT    Head: Normocephalic and atraumatic.   Mouth/Throat: Mucous membranes are dry. Pharynx is normal Cardiovascular: Normal rate, regular rhythm. Normal and symmetric distal pulses are present in the upper extremities.  Respiratory: Normal respiratory effort without tachypnea nor retractions. Breath sounds are clear and equal bilaterally.  Gastrointestinal: Soft and non-tender in all quadrants. No distention. There is no CVA tenderness. Genitourinary: deferred Musculoskeletal: Nontender with normal range of motion in all extremities. No lower extremity tenderness nor edema. Neurologic:  Normal speech and language. No gross focal neurologic deficits are appreciated. Skin:  Skin is warm, dry and intact. No rash noted. Psychiatric: Mood and affect are normal. Patient exhibits appropriate insight and judgment.  ____________________________________________    LABS (pertinent positives/negatives)  Labs Reviewed  URINE CULTURE  CULTURE, BLOOD (ROUTINE X 2)  CULTURE, BLOOD (ROUTINE X 2)  COMPREHENSIVE METABOLIC PANEL  CBC WITH DIFFERENTIAL/PLATELET  URINALYSIS COMPLETEWITH MICROSCOPIC ([facility] ONLY)  LACTIC ACID, PLASMA  LACTIC ACID, PLASMA  LIPASE, BLOOD  TROPONIN I    ____________________________________________   EKG  ED ECG REPORT I, Lavonia Drafts, the attending physician, personally viewed and interpreted this ECG.   Date: 01/03/2016  EKG Time: 1:12 PM  Rate: 75  Rhythm: normal sinus rhythm  Axis: Normal  Intervals:left bundle branch block  ST&T Change:  nonspecific   ____________________________________________    RADIOLOGY  Chest x-ray unremarkable  ____________________________________________   PROCEDURES  Procedure(s) performed: none  Critical Care performed: none  ____________________________________________   INITIAL IMPRESSION / ASSESSMENT AND PLAN / ED COURSE  Pertinent labs & imaging results that were available during my care of the patient were  reviewed by me and considered in my medical decision making (see chart for details).  Patient presents with chills/fever unknown origin. We will check labs, x-ray, urinalysis sent blood cultures and reevaluate.  ----------------------------------------- 1:38 PM on 01/03/2016 -----------------------------------------  Patient with elevated troponin. EKG shows left bundle branch block which is old. No chest pain. Lactic acid is 3.4 but normal white count and afebrile in the department. Pending urine collection  ----------------------------------------- 3:12 PM on 01/03/2016 -----------------------------------------  Patient unable to urinate, we will perform in an out catheter. Will admit to the hospital for further management and evaluation of elevated troponin and creatinine.  ____________________________________________   FINAL  CLINICAL IMPRESSION(S) / ED DIAGNOSES  Final diagnoses:  Elevated troponin  Dehydration  Weakness          Lavonia Drafts, MD 01/03/16 1512

## 2016-01-03 NOTE — Progress Notes (Signed)
Patient in room from ED. Tele box 4020 on and verified with serenity nt. Skin assessment done and verified with maddie Hymes RN. No complaints of pain or discomfort at this time. Patient oriented to the room. Resting quietly with bed in the lowest position and call light in reach will continue to monitor.

## 2016-01-03 NOTE — H&P (Signed)
[place] [facility] [place] NAME: Jason Murphy    MR#:  [id number]  DATE OF BIRTH:  May 29, 1920  DATE OF ADMISSION:  01/03/2016  PRIMARY CARE PHYSICIAN: BLISS, Lynnell Jude, MD   REQUESTING/REFERRING PHYSICIAN: dr Corky Downs  CHIEF COMPLAINT:  Fever and weakness  HISTORY OF PRESENT ILLNESS:  Jason Murphy  is a 82 y.o. male with a known history of CAD who was recently treated for urinary tract infection on Bactrim who presents with above complaint. Patient reports that this morning he had fever with chills. He was started on Bactrim approximately a week ago for urinary tract infection. At that time he was having dysuria, frequency and urgency. His symptoms are somewhat improved. He denies shortness of breath, chest pain or back pain. In the emergency room it is noted that he has a slightly elevated troponin of 0.10.    PAST MEDICAL HISTORY:   Past Medical History  Diagnosis Date  . Coronary artery disease   . Dizziness     chronic dizziness  . Pneumonia   . Hypercholesterolemia   . Hypertension   . Obstructive sleep apnea   . Cancer [facility])     prostate  . Fatigue     Progressive fatigue with question of sleep apnea  . Dyspnea     PAST SURGICAL HISTORY:   Past Surgical History  Procedure Laterality Date  . Appendectomy    . Knee surgery      arthroscopic left knee surgery  . Shoulder arthroscopy    . Cardiac catheterization      Ejection Fraction was 60%, stent    SOCIAL HISTORY:   Social History  Substance Use Topics  . Smoking status: Never Smoker   . Smokeless tobacco: Never Used  . Alcohol Use: Yes     Comment: 1/2 glass wine daily    FAMILY HISTORY:   Family History  Problem Relation Age of Onset  . COPD Mother     DRUG ALLERGIES:  No Known Allergies   REVIEW OF SYSTEMS:  CONSTITUTIONAL: Positive fever, no fatigue Positive generalized weakness. Positive for dizziness EYES: No blurred or double vision.  EARS,  NOSE, AND THROAT: No tinnitus or ear pain.  RESPIRATORY: No cough, shortness of breath, wheezing or hemoptysis.  CARDIOVASCULAR: No chest pain, orthopnea, edema.  GASTROINTESTINAL: No nausea, vomiting, diarrhea or abdominal pain.  GENITOURINARY: ++ dysuria, NO hematuria.  ENDOCRINE: No polyuria, nocturia,  HEMATOLOGY: No anemia, easy bruising or bleeding SKIN: No rash or lesion. MUSCULOSKELETAL: No joint pain or arthritis.   NEUROLOGIC: No tingling, numbness, weakness.  PSYCHIATRY: No anxiety or depression.   MEDICATIONS AT HOME:   Prior to Admission medications   Medication Sig Start Date End Date Taking? Authorizing Provider  aspirin EC 81 MG tablet Take 81 mg by mouth at bedtime.   Yes Historical Provider, MD  co-enzyme Q-10 50 MG capsule Take 50 mg by mouth every morning.    Yes Historical Provider, MD  cyanocobalamin 500 MCG tablet Take 500 mcg by mouth every morning.   Yes Historical Provider, MD  hydrocortisone 2.5 % cream Apply 1 application topically 2 (two) times daily as needed. For itchy skin. Apply to affected area. 12/25/15  Yes Historical Provider, MD  lansoprazole (PREVACID) 15 MG capsule Take 15-30 mg by mouth See admin instructions. Take 1 capsule by mouth every other day alternating with 2 capsules (30mg ) by mouth every other day.   Yes Historical Provider, MD  levothyroxine (SYNTHROID,  LEVOTHROID) 25 MCG tablet Take 25 mcg by mouth every morning.    Yes Historical Provider, MD  simvastatin (ZOCOR) 40 MG tablet TAKE 1 TABLET BY MOUTH EVERY NIGHT AT BEDTIME 05/26/11  Yes Peter M Martinique, MD  sulfamethoxazole-trimethoprim (BACTRIM DS,SEPTRA DS) 800-160 MG tablet Take 1 tablet by mouth 2 (two) times daily. 12/29/15 01/12/16 Yes Historical Provider, MD  triamcinolone cream (KENALOG) 0.1 % Apply 1 application topically 2 (two) times daily as needed. For itchy skin. 12/25/15  Yes Historical Provider, MD      VITAL SIGNS:  Blood pressure 123/68, pulse 77, temperature 98.9 F (37.2  C), temperature source Oral, resp. rate 28, height 5\' 10"  (1.778 m), weight 88.451 kg (195 lb), SpO2 97 %.  PHYSICAL EXAMINATION:  GENERAL:  82 y.o.-year-old patient lying in the bed with no acute distress.  EYES: Pupils equal, round, reactive to light and accommodation. No scleral icterus. Extraocular muscles intact.  HEENT: Head atraumatic, normocephalic. Oropharynx  clear.  NECK:  Supple, no jugular venous distention. No thyroid enlargement, no tenderness.  LUNGS: Normal breath sounds bilaterally, no wheezing, rales,rhonchi or crepitation. No use of accessory muscles of respiration.  CARDIOVASCULAR: S1, S2 normal. No murmurs, rubs, or gallops.  ABDOMEN: Soft, nontender, nondistended. Bowel sounds present. No organomegaly or mass.  EXTREMITIES: No pedal edema, cyanosis, or clubbing.  NEUROLOGIC: Cranial nerves II through XII are grossly intact. No focal deficits. PSYCHIATRIC: The patient is alert and oriented x 3.  SKIN: No obvious rash, lesion, or ulcer.   LABORATORY PANEL:   CBC  Recent Labs Lab 01/03/16 1244  WBC 8.9  HGB 14.3  HCT 42.1  PLT 246   ------------------------------------------------------------------------------------------------------------------  Chemistries   Recent Labs Lab 01/03/16 1244  NA 130*  K 4.2  CL 102  CO2 16*  GLUCOSE 139*  BUN 33*  CREATININE 1.66*  CALCIUM 8.2*  AST 72*  ALT 39  ALKPHOS 93  BILITOT 0.9   ------------------------------------------------------------------------------------------------------------------  Cardiac Enzymes  Recent Labs Lab 01/03/16 1244  TROPONINI 0.10*   ------------------------------------------------------------------------------------------------------------------  RADIOLOGY:  Dg Chest 2 View  01/03/2016  CLINICAL DATA:  Pt reports having a fever intermittently for 10 days. Was diagnosed with UTI by PCP and placed on antibiotic; pt started feeling better so he stopped taking them, but then  he began feeling bad again and restarted antibiotics. EXAM: CHEST  2 VIEW COMPARISON:  01/05/2012 FINDINGS: Heart size is upper limits normal. There is minimal atelectasis at the left lung base. There are no focal consolidations. Suspect small bilateral pleural effusions. The posterior costophrenic angles are excluded from the study. There are chronic changes in both shoulders. IMPRESSION: 1. Minimal left lower lobe atelectasis. 2. Suspect small bilateral pleural effusions. Electronically Signed   By: Nolon Nations M.D.   On: 01/03/2016 13:44    EKG:   Normal sinus rhythm left bundle branch block  IMPRESSION AND PLAN:   82 year old male with a history of CAD and recent treatment for urinary tract infection on Bactrim who presents with fever and chills and found to have hyponatremia and elevated troponin.  1. Hyponatremia: This is due to dehydration. Continue IV fluids. Repeat BMP in a.m.  2. Elevated troponin: This is likely due to demand ischemia and not ACS. Continue telemetry monitoring and follow troponins.  3. Fever: I am suspecting patient is untreated urinary tract infection as he continues to have symptoms of dysuria, frequency and urgency. Start Rocephin and follow-up on UA.  4. Acute kidney injury: This is due to  dehydration with poor by mouth intake. Continue IV fluids and repeat BMP in a.m.  5. Hypothyroidism: Continue Synthroid and check TSH due to hyponatremia.  6. Hyperlipidemia: Continue Zocor. 7. History of CAD with prior stenting of left circumflex artery in 2008: Continue aspirin therapy and statin.   All the records are reviewed and case discussed with ED provider. Management plans discussed with the patient and he  is in agreement.  CODE STATUS: FULL  TOTAL TIME TAKING CARE OF THIS PATIENT: 50 minutes.    Stanislawa Gaffin M.D on 01/03/2016 at 3:46 PM  Between 7am to 6pm - Pager - [phone] After 6pm go to [email or web address] - password [email or web address] [place] [facility]  Office  [phone]  CC: Primary care physician; Lynnell Jude, MD

## 2016-01-04 ENCOUNTER — Inpatient Hospital Stay
Admit: 2016-01-04 | Discharge: 2016-01-04 | Disposition: A | Discharge status: Home / Self Care | Payer: PPO | Attending: Internal Medicine | Admitting: Internal Medicine

## 2016-01-04 ENCOUNTER — Inpatient Hospital Stay: Payer: PPO

## 2016-01-04 DIAGNOSIS — I10 Essential (primary) hypertension: Secondary | ICD-10-CM | POA: Diagnosis not present

## 2016-01-04 DIAGNOSIS — R0602 Shortness of breath: Secondary | ICD-10-CM | POA: Diagnosis not present

## 2016-01-04 DIAGNOSIS — I4891 Unspecified atrial fibrillation: Secondary | ICD-10-CM | POA: Diagnosis not present

## 2016-01-04 DIAGNOSIS — R509 Fever, unspecified: Secondary | ICD-10-CM | POA: Diagnosis not present

## 2016-01-04 DIAGNOSIS — I25119 Atherosclerotic heart disease of native coronary artery with unspecified angina pectoris: Secondary | ICD-10-CM | POA: Diagnosis not present

## 2016-01-04 DIAGNOSIS — R079 Chest pain, unspecified: Secondary | ICD-10-CM | POA: Diagnosis not present

## 2016-01-04 LAB — URINALYSIS COMPLETE WITH MICROSCOPIC (ARMC ONLY)
Bilirubin Urine: NEGATIVE
Glucose, UA: NEGATIVE mg/dL
HGB URINE DIPSTICK: NEGATIVE
Ketones, ur: NEGATIVE mg/dL
LEUKOCYTES UA: NEGATIVE
Nitrite: NEGATIVE
PH: 5 (ref 5.0–8.0)
PROTEIN: 100 mg/dL — AB
SPECIFIC GRAVITY, URINE: 1.028 (ref 1.005–1.030)
SQUAMOUS EPITHELIAL / LPF: NONE SEEN

## 2016-01-04 LAB — ECHOCARDIOGRAM COMPLETE
HEIGHTINCHES: 70 in
WEIGHTICAEL: 3092.8 [oz_av]

## 2016-01-04 LAB — BASIC METABOLIC PANEL
ANION GAP: 5 (ref 5–15)
BUN: 27 mg/dL — ABNORMAL HIGH (ref 6–20)
CHLORIDE: 105 mmol/L (ref 101–111)
CO2: 24 mmol/L (ref 22–32)
Calcium: 8.3 mg/dL — ABNORMAL LOW (ref 8.9–10.3)
Creatinine, Ser: 1.47 mg/dL — ABNORMAL HIGH (ref 0.61–1.24)
GFR calc Af Amer: 45 mL/min — ABNORMAL LOW (ref >60)
GFR, EST NON AFRICAN AMERICAN: 39 mL/min — AB (ref >60)
GLUCOSE: 119 mg/dL — AB (ref 65–99)
POTASSIUM: 4.7 mmol/L (ref 3.5–5.1)
SODIUM: 134 mmol/L — AB (ref 135–145)

## 2016-01-04 LAB — T4, FREE: FREE T4: 0.68 ng/dL (ref 0.61–1.12)

## 2016-01-04 LAB — URINE CULTURE: CULTURE: NO GROWTH

## 2016-01-04 LAB — CBC
HEMATOCRIT: 41.6 % (ref 40.0–52.0)
HEMOGLOBIN: 14.1 g/dL (ref 13.0–18.0)
MCH: 31.8 pg (ref 26.0–34.0)
MCHC: 33.8 g/dL (ref 32.0–36.0)
MCV: 94.1 fL (ref 80.0–100.0)
Platelets: 229 10*3/uL (ref 150–440)
RBC: 4.42 MIL/uL (ref 4.40–5.90)
RDW: 13.9 % (ref 11.5–14.5)
WBC: 7.5 10*3/uL (ref 3.8–10.6)

## 2016-01-04 LAB — TROPONIN I
TROPONIN I: 0.05 ng/mL — AB (ref <0.031)
Troponin I: 0.06 ng/mL — ABNORMAL HIGH (ref <0.031)

## 2016-01-04 LAB — TSH: TSH: 9.454 u[IU]/mL — ABNORMAL HIGH (ref 0.350–4.500)

## 2016-01-04 LAB — MRSA PCR SCREENING: MRSA by PCR: NEGATIVE

## 2016-01-04 LAB — GLUCOSE, CAPILLARY: GLUCOSE-CAPILLARY: 125 mg/dL — AB (ref 65–99)

## 2016-01-04 MED ORDER — SODIUM CHLORIDE 0.9 % IV BOLUS (SEPSIS)
500.0000 mL | Freq: Once | INTRAVENOUS | Status: AC
  Administered 2016-01-04: 500 mL via INTRAVENOUS

## 2016-01-04 MED ORDER — METHYLPREDNISOLONE SODIUM SUCC 125 MG IJ SOLR
INTRAMUSCULAR | Status: AC
  Filled 2016-01-04: qty 2

## 2016-01-04 MED ORDER — POTASSIUM CHLORIDE CRYS ER 20 MEQ PO TBCR
40.0000 meq | EXTENDED_RELEASE_TABLET | Freq: Once | ORAL | Status: AC
  Administered 2016-01-04: 40 meq via ORAL

## 2016-01-04 MED ORDER — FUROSEMIDE 10 MG/ML IJ SOLN
INTRAMUSCULAR | Status: AC
  Administered 2016-01-04: 20 mg via INTRAVENOUS
  Filled 2016-01-04: qty 2

## 2016-01-04 MED ORDER — PIPERACILLIN-TAZOBACTAM 3.375 G IVPB
3.3750 g | Freq: Three times a day (TID) | INTRAVENOUS | Status: DC
  Administered 2016-01-04 – 2016-01-06 (×5): 3.375 g via INTRAVENOUS
  Filled 2016-01-04 (×7): qty 50

## 2016-01-04 MED ORDER — FUROSEMIDE 10 MG/ML IJ SOLN
20.0000 mg | Freq: Once | INTRAMUSCULAR | Status: AC
  Administered 2016-01-04: 20 mg via INTRAVENOUS

## 2016-01-04 MED ORDER — AMIODARONE LOAD VIA INFUSION
150.0000 mg | Freq: Once | INTRAVENOUS | Status: AC
  Administered 2016-01-04: 150 mg via INTRAVENOUS
  Filled 2016-01-04: qty 83.34

## 2016-01-04 MED ORDER — IPRATROPIUM-ALBUTEROL 0.5-2.5 (3) MG/3ML IN SOLN
3.0000 mL | RESPIRATORY_TRACT | Status: DC
  Administered 2016-01-04 – 2016-01-05 (×3): 3 mL via RESPIRATORY_TRACT
  Filled 2016-01-04 (×4): qty 3

## 2016-01-04 MED ORDER — FUROSEMIDE 20 MG PO TABS: 20.0000 mg | ORAL_TABLET | Freq: Once | ORAL | Status: DC

## 2016-01-04 MED ORDER — FUROSEMIDE 10 MG/ML IJ SOLN
40.0000 mg | Freq: Once | INTRAMUSCULAR | Status: AC
  Administered 2016-01-04: 40 mg via INTRAVENOUS

## 2016-01-04 MED ORDER — AMIODARONE HCL IN DEXTROSE 360-4.14 MG/200ML-% IV SOLN
60.0000 mg/h | INTRAVENOUS | Status: DC
  Administered 2016-01-04 (×2): 60 mg/h via INTRAVENOUS
  Filled 2016-01-04 (×2): qty 200

## 2016-01-04 MED ORDER — BUDESONIDE 0.25 MG/2ML IN SUSP
0.2500 mg | Freq: Two times a day (BID) | RESPIRATORY_TRACT | Status: DC
  Administered 2016-01-04 – 2016-01-07 (×6): 0.25 mg via RESPIRATORY_TRACT
  Filled 2016-01-04 (×7): qty 2

## 2016-01-04 MED ORDER — AMIODARONE HCL 200 MG PO TABS
400.0000 mg | ORAL_TABLET | Freq: Every day | ORAL | Status: DC
  Administered 2016-01-04 – 2016-01-05 (×2): 400 mg via ORAL
  Filled 2016-01-04 (×3): qty 2

## 2016-01-04 MED ORDER — METHYLPREDNISOLONE SODIUM SUCC 125 MG IJ SOLR
60.0000 mg | Freq: Once | INTRAMUSCULAR | Status: AC
  Administered 2016-01-04: 60 mg via INTRAVENOUS

## 2016-01-04 MED ORDER — DOXYCYCLINE HYCLATE 100 MG PO TABS
100.0000 mg | ORAL_TABLET | Freq: Two times a day (BID) | ORAL | Status: DC
  Administered 2016-01-04 – 2016-01-06 (×4): 100 mg via ORAL
  Filled 2016-01-04 (×4): qty 1

## 2016-01-04 MED ORDER — METHYLPREDNISOLONE SODIUM SUCC 125 MG IJ SOLR
60.0000 mg | Freq: Four times a day (QID) | INTRAMUSCULAR | Status: DC
Start: 2016-01-04 — End: 2016-01-04

## 2016-01-04 MED ORDER — AMIODARONE HCL IN DEXTROSE 360-4.14 MG/200ML-% IV SOLN
30.0000 mg/h | INTRAVENOUS | Status: DC
  Administered 2016-01-04: 30 mg/h via INTRAVENOUS
  Filled 2016-01-04 (×2): qty 200

## 2016-01-04 MED ORDER — DILTIAZEM HCL 25 MG/5ML IV SOLN
10.0000 mg | Freq: Once | INTRAVENOUS | Status: AC
  Administered 2016-01-04: 10 mg via INTRAVENOUS
  Filled 2016-01-04: qty 5

## 2016-01-04 MED ORDER — FUROSEMIDE 10 MG/ML IJ SOLN
20.0000 mg | Freq: Once | INTRAMUSCULAR | Status: AC
  Administered 2016-01-04: 20 mg via INTRAVENOUS
  Filled 2016-01-04: qty 2

## 2016-01-04 MED ORDER — FUROSEMIDE 10 MG/ML IJ SOLN
INTRAMUSCULAR | Status: AC
  Filled 2016-01-04: qty 4

## 2016-01-04 MED ORDER — IPRATROPIUM-ALBUTEROL 0.5-2.5 (3) MG/3ML IN SOLN: 3.0000 mL | Freq: Four times a day (QID) | RESPIRATORY_TRACT | Status: DC

## 2016-01-04 MED ORDER — DILTIAZEM HCL 100 MG IV SOLR
5.0000 mg/h | INTRAVENOUS | Status: DC
  Filled 2016-01-04: qty 100

## 2016-01-04 MED ORDER — IPRATROPIUM-ALBUTEROL 0.5-2.5 (3) MG/3ML IN SOLN
RESPIRATORY_TRACT | Status: AC
  Administered 2016-01-04: 17:00:00
  Filled 2016-01-04: qty 3

## 2016-01-04 NOTE — Progress Notes (Signed)
Patient condition improving SPO2 95 on 3LNC yet still appears to have labored breathing. Dr. Earleen Newport who was on call was notified of patients condition. Stated that he would be up to see the patient. Will continue to monitor patient at this time.

## 2016-01-04 NOTE — Consult Note (Signed)
[facility] Consultation Note  Patient ID: Jason Murphy, MRN: [id number], DOB/AGE: 1920-08-15 82 y.o. Admit date: 01/03/2016   Date of Consult: 01/04/2016 Primary Physician: Lynnell Jude, MD Primary Cardiologist: Nehemiah Massed  Chief Complaint:  Chief Complaint  Patient presents with  . Fever  . Urinary Tract Infection   Reason for Consult: coronary artery disease with elevated troponin  HPI: 82 y.o. male with known history of atrial fibrillation coronary artery disease status post stenting chronic left bundle-branch block sleep apnea essential hypertension and mixed hyperlipidemia with new onset urinary tract infection with weakness fatigue and fever and other constitutional issues. The patient was placed on appropriate medication management and currently is in normal sinus rhythm with left bundle-branch block. The patient has had treatment with amiodarone for his atrial fibrillation and is maintaining normal rhythm at this time. Additionally the the patient has had no evidence of myocardial infarction with a troponin of 0.11 and no evidence of symptoms or heart failure. The patient does have sleep apnea for which the patient has been appropriately treating this and hypertension is well-controlled with lipid management being treated in the past but currently no statin therapy. The patient feels much better after antibiotics and treatment of the urinary tract infection  Past Medical History  Diagnosis Date  . Coronary artery disease   . Dizziness     chronic dizziness  . Pneumonia   . Hypercholesterolemia   . Hypertension   . Obstructive sleep apnea   . Cancer [facility])     prostate  . Fatigue     Progressive fatigue with question of sleep apnea  . Dyspnea       Surgical History:  Past Surgical History  Procedure Laterality Date  . Appendectomy    . Knee surgery      arthroscopic left knee surgery  . Shoulder arthroscopy    . Cardiac catheterization      Ejection  Fraction was 60%, stent     Home Meds: Prior to Admission medications   Medication Sig Start Date End Date Taking? Authorizing Provider  aspirin EC 81 MG tablet Take 81 mg by mouth at bedtime.   Yes Historical Provider, MD  co-enzyme Q-10 50 MG capsule Take 50 mg by mouth every morning.    Yes Historical Provider, MD  cyanocobalamin 500 MCG tablet Take 500 mcg by mouth every morning.   Yes Historical Provider, MD  hydrocortisone 2.5 % cream Apply 1 application topically 2 (two) times daily as needed. For itchy skin. Apply to affected area. 12/25/15  Yes Historical Provider, MD  lansoprazole (PREVACID) 15 MG capsule Take 15-30 mg by mouth See admin instructions. Take 1 capsule by mouth every other day alternating with 2 capsules (30mg ) by mouth every other day.   Yes Historical Provider, MD  levothyroxine (SYNTHROID, LEVOTHROID) 25 MCG tablet Take 25 mcg by mouth every morning.    Yes Historical Provider, MD  simvastatin (ZOCOR) 40 MG tablet TAKE 1 TABLET BY MOUTH EVERY NIGHT AT BEDTIME 05/26/11  Yes Peter M Martinique, MD  sulfamethoxazole-trimethoprim (BACTRIM DS,SEPTRA DS) 800-160 MG tablet Take 1 tablet by mouth 2 (two) times daily. 12/29/15 01/12/16 Yes Historical Provider, MD  triamcinolone cream (KENALOG) 0.1 % Apply 1 application topically 2 (two) times daily as needed. For itchy skin. 12/25/15  Yes Historical Provider, MD    Inpatient Medications:  . aspirin EC  81 mg Oral QHS  . B-complex with vitamin C  1 tablet Oral Daily  . cefTRIAXone (  ROCEPHIN)  IV  1 g Intravenous Q24H  . enoxaparin (LOVENOX) injection  40 mg Subcutaneous Q24H  . levothyroxine  25 mcg Oral Daily  . pantoprazole  40 mg Oral Daily  . simvastatin  40 mg Oral QHS  . sodium chloride flush  3 mL Intravenous Q12H   . sodium chloride 100 mL/hr at 01/04/16 0559  . amiodarone 60 mg/hr (01/04/16 1019)   Followed by  . amiodarone      Allergies: No Known Allergies  Social History   Social History  . Marital Status:  Married    Spouse Name: N/A  . Number of Children: 4  . Years of Education: Col Grad   Occupational History  . Retired    Social History Main Topics  . Smoking status: Never Smoker   . Smokeless tobacco: Never Used  . Alcohol Use: Yes     Comment: 1/2 glass wine daily  . Drug Use: No  . Sexual Activity: Not on file   Other Topics Concern  . Not on file   Social History Narrative   Caffeine Occ cup of coffee.     Family History  Problem Relation Age of Onset  . COPD Mother      Review of Systems Positive for Urinary symptoms weakness and fatigue Negative for: General:  chills, fever, night sweats or weight changes.  Cardiovascular: PND orthopnea syncope dizziness  Dermatological skin lesions rashes Respiratory: Cough congestion Urologic: Positive for Frequent urination urination at night and no hematuria Abdominal: negative for nausea, vomiting, diarrhea, bright red blood per rectum, melena, or hematemesis Neurologic: negative for visual changes, and/or hearing changes  All other systems reviewed and are otherwise negative except as noted above.  Labs:  Recent Labs  01/03/16 1244 01/03/16 1601 01/03/16 2134 01/04/16 0506  TROPONINI 0.10* 0.11* 0.10* 0.05*   Lab Results  Component Value Date   WBC 7.5 01/04/2016   HGB 14.1 01/04/2016   HCT 41.6 01/04/2016   MCV 94.1 01/04/2016   PLT 229 01/04/2016    Recent Labs Lab 01/03/16 1244 01/04/16 0506  NA 130* 134*  K 4.2 4.7  CL 102 105  CO2 16* 24  BUN 33* 27*  CREATININE 1.66* 1.47*  CALCIUM 8.2* 8.3*  PROT 7.5  --   BILITOT 0.9  --   ALKPHOS 93  --   ALT 39  --   AST 72*  --   GLUCOSE 139* 119*   No results found for: CHOL, HDL, LDLCALC, TRIG No results found for: DDIMER  Radiology/Studies:  Dg Chest 1 View  01/04/2016  CLINICAL DATA:  Fever and chills. EXAM: CHEST 1 VIEW COMPARISON:  01/03/2016. FINDINGS: Mediastinum and hilar structures are normal. Mild right base subsegmental atelectasis  and or infiltrate. Mild left base subsegmental atelectasis. No pleural effusion or pneumothorax. IMPRESSION: Mild bibasilar subsegmental atelectasis and/or infiltrates. Electronically Signed   By: Marcello Moores  Register   On: 01/04/2016 09:22   Dg Chest 2 View  01/03/2016  CLINICAL DATA:  Pt reports having a fever intermittently for 10 days. Was diagnosed with UTI by PCP and placed on antibiotic; pt started feeling better so he stopped taking them, but then he began feeling bad again and restarted antibiotics. EXAM: CHEST  2 VIEW COMPARISON:  01/05/2012 FINDINGS: Heart size is upper limits normal. There is minimal atelectasis at the left lung base. There are no focal consolidations. Suspect small bilateral pleural effusions. The posterior costophrenic angles are excluded from the study. There are chronic changes  in both shoulders. IMPRESSION: 1. Minimal left lower lobe atelectasis. 2. Suspect small bilateral pleural effusions. Electronically Signed   By: Nolon Nations M.D.   On: 01/03/2016 13:44    EKG: Normal sinus rhythm with left bundle-branch block  Weights: Filed Weights   01/03/16 1233 01/03/16 1700  Weight: 195 lb (88.451 kg) 193 lb 4.8 oz (87.68 kg)     Physical Exam: Blood pressure 122/62, pulse 98, temperature 98.8 F (37.1 C), temperature source Oral, resp. rate 18, height 5\' 10"  (1.778 m), weight 193 lb 4.8 oz (87.68 kg), SpO2 95 %. Body mass index is 27.74 kg/(m^2). General: Well developed, well nourished, in no acute distress. Head eyes ears nose throat: Normocephalic, atraumatic, sclera non-icteric, no xanthomas, nares are without discharge. No apparent thyromegaly and/or mass  Lungs: Normal respiratory effort.  no wheezes, no rales, no rhonchi.  Heart: RRR with normal S1 soft S2. 2+ apical murmur gallop, no rub, PMI is normal size and placement, carotid upstroke normal without bruit, jugular venous pressure is normal Abdomen: Soft, non-tender, non-distended with normoactive bowel  sounds. No hepatomegaly. No rebound/guarding. No obvious abdominal masses. Abdominal aorta is normal size without bruit Extremities: Trace to 1+ edema. no cyanosis, no clubbing, no ulcers  Peripheral : 2+ bilateral upper extremity pulses, 2+ bilateral femoral pulses, 2+ bilateral dorsal pedal pulse Neuro: Alert and oriented. No facial asymmetry. No focal deficit. Moves all extremities spontaneously. Musculoskeletal: Normal muscle tone without kyphosis Psych:  Responds to questions appropriately with a normal affect.    Assessment: 82 year old male with known coronary artery disease status post previous stenting chronic left bundle branch block sleep apnea essential hypertension makes hyperlipidemia with atrial fibrillation now in normal sinus rhythm elevated troponin consistent with the demand ischemia rather than acute coronary syndrome  Plan: 1. Currently okay for changing amiodarone drip to oral amiodarone 400 mg each day due to maintaining normal sinus rhythm at this time and converted with relative ease 2. Echocardiogram for LV systolic dysfunction valvular heart disease contributing to above with adjustments of medications 3. Consider addition of low-dose beta blocker for heart rate control agents of normal sinus rhythm 3. High intensity cholesterol therapy with simvastatin 5. Consideration of anticoagulation although with concerns of urinary tract infection and hematuria advanced age would consider deferred to outpatient evaluation and additional medications at that time 6. No further intervention of minimal elevation of troponin more consistent with demand ischemia rather than acute coronary syndrome  Signed, Corey Skains M.D. [facility] 01/04/2016, 12:56 PM

## 2016-01-04 NOTE — Progress Notes (Signed)
PT Cancellation Note  Patient Details Name: Jason Murphy MRN: [id number] DOB: 1920/09/16   Cancelled Treatment:    Reason Eval/Treat Not Completed: Medical issues which prohibited therapy.  Pt currently exhibiting elevated HR with new onset of A-Fib.  Pt put on amiodarone drip at 1019 (01-04-16).  Will hold PT at this time and re-attempt PT eval at a later date and time.  Mittie Bodo, SPT Mittie Bodo 01/04/2016, 10:35 AM

## 2016-01-04 NOTE — Progress Notes (Signed)
ANTIBIOTIC CONSULT NOTE - INITIAL  Pharmacy Consult for Zosyn  Indication: Aspiration Pneumonia  No Known Allergies  Patient Measurements: Height: 5\' 10"  (177.8 cm) Weight: 193 lb 4.8 oz (87.68 kg) IBW/kg (Calculated) : 73  Vital Signs: BP: 122/62 mmHg (03/20 1254) Pulse Rate: 98 (03/20 1254) Intake/Output from previous day: 03/19 0701 - 03/20 0700 In: 836.3 [I.V.:836.3] Out: 700 [Urine:700] Intake/Output from this shift: Total I/O In: 600 [P.O.:600] Out: 0   Labs:  Recent Labs  01/03/16 1244 01/04/16 0506  WBC 8.9 7.5  HGB 14.3 14.1  PLT 246 229  CREATININE 1.66* 1.47*   Estimated Creatinine Clearance: 34.3 mL/min (by C-G formula based on Cr of 1.47). No results for input(s): VANCOTROUGH, VANCOPEAK, VANCORANDOM, GENTTROUGH, GENTPEAK, GENTRANDOM, TOBRATROUGH, TOBRAPEAK, TOBRARND, AMIKACINPEAK, AMIKACINTROU, AMIKACIN in the last 72 hours.   Microbiology: Recent Results (from the past 720 hour(s))  Blood culture (routine x 2)     Status: None (Preliminary result)   Collection Time: 01/03/16 12:44 PM  Result Value Ref Range Status   Specimen Description BLOOD LEFT WRIST  Final   Special Requests BOTTLES DRAWN AEROBIC AND ANAEROBIC 2 CC  Final   Culture NO GROWTH < 24 HOURS  Final   Report Status PENDING  Incomplete  Blood culture (routine x 2)     Status: None (Preliminary result)   Collection Time: 01/03/16  1:32 PM  Result Value Ref Range Status   Specimen Description BLOOD RIGHT HAND  Final   Special Requests BOTTLES DRAWN AEROBIC AND ANAEROBIC  2 CC  Final   Culture NO GROWTH < 24 HOURS  Final   Report Status PENDING  Incomplete  Rapid Influenza A&B Antigens ([place] only)     Status: None   Collection Time: 01/03/16  1:32 PM  Result Value Ref Range Status   Influenza A ([facility]) NEGATIVE NEGATIVE Final   Influenza B ([facility]) NEGATIVE NEGATIVE Final  Urine culture     Status: None   Collection Time: 01/03/16  3:13 PM  Result Value Ref Range Status   Specimen  Description URINE, CLEAN CATCH  Final   Special Requests NONE  Final   Culture NO GROWTH 1 DAY  Final   Report Status 01/04/2016 FINAL  Final    Medical History: Past Medical History  Diagnosis Date  . Coronary artery disease   . Dizziness     chronic dizziness  . Pneumonia   . Hypercholesterolemia   . Hypertension   . Obstructive sleep apnea   . Cancer [facility])     prostate  . Fatigue     Progressive fatigue with question of sleep apnea  . Dyspnea     Medications:  Scheduled:  . amiodarone  400 mg Oral Daily  . aspirin EC  81 mg Oral QHS  . B-complex with vitamin C  1 tablet Oral Daily  . budesonide (PULMICORT) nebulizer solution  0.25 mg Nebulization BID  . doxycycline  100 mg Oral Q12H  . enoxaparin (LOVENOX) injection  40 mg Subcutaneous Q24H  . furosemide      . ipratropium-albuterol  3 mL Nebulization Q4H  . levothyroxine  25 mcg Oral Daily  . methylPREDNISolone sodium succinate      . pantoprazole  40 mg Oral Daily  . piperacillin-tazobactam (ZOSYN)  IV  3.375 g Intravenous Q8H  . simvastatin  40 mg Oral QHS  . sodium chloride flush  3 mL Intravenous Q12H   Infusions:   Assessment: 82 yo male with acute respiratory failure with  hypoxia, fluid overload vs. Pneumonia.  Zosyn ordered just in case.    Plan:  Ordered Zosyn 3.375g EI every 8 hours.  Pharmacy to follow as per consult.  Olivia Canter, RPh Clinical Pharmacist 01/04/2016,6:43 PM

## 2016-01-04 NOTE — Progress Notes (Signed)
Patient still complains of shortness of breath lung sounds unchanged. SPO2 93 on 6LNC. Dr. Benjie Karvonen notified of results. Orders to give another dose of 20 mg of lasix and obtain a stat chest xray. Will continue to monitor.

## 2016-01-04 NOTE — Progress Notes (Signed)
[facility] [place] NAME: Jason Murphy    MR#:  [id number]  DATE OF BIRTH:  [birth year]  SUBJECTIVE:   patient with chills early this am and HR was elevated given IV dilt  HRs still elevated with new onset A fib on tele monitor Denies CP or palpitations  REVIEW OF SYSTEMS:    Review of Systems  Constitutional: Positive for chills and malaise/fatigue. Negative for fever.  HENT: Negative for sore throat.   Eyes: Negative for blurred vision.  Respiratory: Positive for cough. Negative for hemoptysis, shortness of breath and wheezing.   Cardiovascular: Negative for chest pain, palpitations and leg swelling.  Gastrointestinal: Negative for nausea, vomiting, abdominal pain, diarrhea and blood in stool.  Genitourinary: Negative for dysuria.  Musculoskeletal: Negative for back pain.  Neurological: Positive for weakness. Negative for dizziness, tremors and headaches.  Endo/Heme/Allergies: Does not bruise/bleed easily.    Tolerating Diet:yes      DRUG ALLERGIES:  No Known Allergies  VITALS:  Blood pressure 141/81, pulse 127, temperature 98.8 F (37.1 C), temperature source Oral, resp. rate 36, height 5\' 10"  (1.778 m), weight 87.68 kg (193 lb 4.8 oz), SpO2 100 %.  PHYSICAL EXAMINATION:   Physical Exam  Constitutional: He is oriented to person, place, and time and well-developed, well-nourished, and in no distress. No distress.  HENT:  Head: Normocephalic.  Eyes: No scleral icterus.  Neck: Normal range of motion. Neck supple. No JVD present. No tracheal deviation present.  Cardiovascular: Normal rate and regular rhythm.  Exam reveals no gallop and no friction rub.   Murmur heard. Irr, irr tachy  Pulmonary/Chest: Effort normal and breath sounds normal. No respiratory distress. He has no wheezes. He has no rales. He exhibits no tenderness.  Abdominal: Soft. Bowel sounds are normal. He exhibits no distension and no mass. There is no  tenderness. There is no rebound and no guarding.  Musculoskeletal: Normal range of motion. He exhibits no edema.  Neurological: He is alert and oriented to person, place, and time.  Skin: Skin is warm. No rash noted. No erythema.  Psychiatric: Affect and judgment normal.      LABORATORY PANEL:   CBC  Recent Labs Lab 01/04/16 0506  WBC 7.5  HGB 14.1  HCT 41.6  PLT 229   ------------------------------------------------------------------------------------------------------------------  Chemistries   Recent Labs Lab 01/03/16 1244 01/04/16 0506  NA 130* 134*  K 4.2 4.7  CL 102 105  CO2 16* 24  GLUCOSE 139* 119*  BUN 33* 27*  CREATININE 1.66* 1.47*  CALCIUM 8.2* 8.3*  AST 72*  --   ALT 39  --   ALKPHOS 93  --   BILITOT 0.9  --    ------------------------------------------------------------------------------------------------------------------  Cardiac Enzymes  Recent Labs Lab 01/03/16 1601 01/03/16 2134 01/04/16 0506  TROPONINI 0.11* 0.10* 0.05*   ------------------------------------------------------------------------------------------------------------------  RADIOLOGY:  Dg Chest 2 View  01/03/2016  CLINICAL DATA:  Pt reports having a fever intermittently for 10 days. Was diagnosed with UTI by PCP and placed on antibiotic; pt started feeling better so he stopped taking them, but then he began feeling bad again and restarted antibiotics. EXAM: CHEST  2 VIEW COMPARISON:  01/05/2012 FINDINGS: Heart size is upper limits normal. There is minimal atelectasis at the left lung base. There are no focal consolidations. Suspect small bilateral pleural effusions. The posterior costophrenic angles are excluded from the study. There are chronic changes in both shoulders. IMPRESSION: 1. Minimal left lower lobe atelectasis. 2.  Suspect small bilateral pleural effusions. Electronically Signed   By: Nolon Nations M.D.   On: 01/03/2016 13:44     ASSESSMENT AND PLAN:     82 year old male with a history of CAD and recent treatment for urinary tract infection on Bactrim who presents with fever and chills and found to have hyponatremia and elevated troponin.  1. Hyponatremia: This was from dehydration. Na improved  2. Elevated troponin: This is due to demand ischemia and not ACS. Troponins back down T max was 0.11 now 0.05  3. Fever with hypoxic resp failure, acute: UA negative for UTI Repeat CXR today to evalulate for PNA not initially seen on admit Consider CT CHEST if CXR negative Influenza A/B negative   4. New Onset Atrial Fibrillation: Suspect this is from a pulmonary issue CHECK ECHO  CARDIOLOGY Consult Start IV dilt gtt for better HR control Anticoagulation as per CARDIOLOGY CHADS score 1   4. Acute kidney injury: This is due to dehydration with poor by mouth intake. Improved with IVF  5. Hypothyroidism:TSH elevated check T4 and T3 before making changes to Synthroid dose  6. Hyperlipidemia: Continue Zocor. 7. History of CAD with prior stenting of left circumflex artery in 2008: Continue aspirin therapy and ZOCOR.      Management plans discussed with the patient and he is in agreement.  CODE STATUS: FULL  TOTAL TIME TAKING CARE OF THIS PATIENT: 50 minutes.     POSSIBLE D/C 1-2 days, DEPENDING ON CLINICAL CONDITION.   Yamato Kopf M.D on 01/04/2016 at 8:49 AM  Between 7am to 6pm - Pager - [phone] After 6pm go to [email or web address] - password [email or web address] [facility]  Office  [phone]  CC: Primary care physician; Lynnell Jude, MD  Note: This dictation was prepared with Dragon dictation along with smaller phrase technology. Any transcriptional errors that result from this process are unintentional.

## 2016-01-04 NOTE — Progress Notes (Signed)
Pt uncontrollably shaking, HR as high as the 140's, which is new for this patient, pt still in sinus though.Pt only put out 200 for this shift so far, very concentrated.  MD paged, Dr. Estanislado Pandy wants to give a 588ml bolus & then increase normal saline to 147ml/hr. Will continue to monitor. Conley Simmonds, RN

## 2016-01-04 NOTE — Progress Notes (Signed)
Patients BP 106/60 notified dr. Benjie Karvonen prior to starting amiodarone drip to clarify whether she still wanted to continue with administration. She stated to continue with the plan and monitor bp. Will continue to monitor.

## 2016-01-04 NOTE — Progress Notes (Signed)
*  PRELIMINARY RESULTS* Echocardiogram 2D Echocardiogram has been performed.  Jason Murphy 01/04/2016, 2:07 PM

## 2016-01-04 NOTE — Progress Notes (Signed)
Report called to CCU and patient transported to ICU bed 6

## 2016-01-04 NOTE — Progress Notes (Signed)
Patient ID: Jason Murphy, male   DOB: 17-Aug-1920, 82 y.o.   MRN: [id number] [facility] PROGRESS NOTE  [name] [id number] DOB: 08-20-1920 DOA: 01/03/2016 PCP: Lynnell Jude, MD  HPI/Subjective: Called by nursing staff for respiratory distress. With audible wheezing. Patient is now in normal sinus rhythm. The patient did receive a fluid bolus this morning secondary to hypotension and has been receiving IV fluids during the entire hospital course. Chest x-ray heart to tell if it's pneumonia or fluid  Objective: Filed Vitals:   01/04/16 1151 01/04/16 1254  BP: 105/58 122/62  Pulse: 92 98  Temp:    Resp:      Filed Weights   01/03/16 1233 01/03/16 1700  Weight: 88.451 kg (195 lb) 87.68 kg (193 lb 4.8 oz)    ROS: Review of Systems  Respiratory: Positive for shortness of breath.   Cardiovascular: Positive for chest pain.  Gastrointestinal: Negative for abdominal pain.   Exam: Physical Exam  Cardiovascular: Regular rhythm, S1 normal, S2 normal and normal heart sounds.   Respiratory: Accessory muscle usage present. He is in respiratory distress. He has wheezes in the right middle field, the right lower field, the left middle field and the left lower field. He has rales in the right lower field and the left lower field.      Data Reviewed: Basic Metabolic Panel:  Recent Labs Lab 01/03/16 1244 01/04/16 0506  NA 130* 134*  K 4.2 4.7  CL 102 105  CO2 16* 24  GLUCOSE 139* 119*  BUN 33* 27*  CREATININE 1.66* 1.47*  CALCIUM 8.2* 8.3*   Liver Function Tests:  Recent Labs Lab 01/03/16 1244  AST 72*  ALT 39  ALKPHOS 93  BILITOT 0.9  PROT 7.5  ALBUMIN 3.5    Recent Labs Lab 01/03/16 1244  LIPASE 18   No results for input(s): AMMONIA in the last 168 hours. CBC:  Recent Labs Lab 01/03/16 1244 01/04/16 0506  WBC 8.9 7.5  NEUTROABS 8.2*  --   HGB 14.3 14.1  HCT 42.1 41.6  MCV 94.6 94.1  PLT 246 229   Cardiac Enzymes:  Recent  Labs Lab 01/03/16 1244 01/03/16 1601 01/03/16 2134 01/04/16 0506  TROPONINI 0.10* 0.11* 0.10* 0.05*   BNP (last 3 results) No results for input(s): BNP in the last 8760 hours.  ProBNP (last 3 results) No results for input(s): PROBNP in the last 8760 hours.  CBG: No results for input(s): GLUCAP in the last 168 hours.  Recent Results (from the past 240 hour(s))  Blood culture (routine x 2)     Status: None (Preliminary result)   Collection Time: 01/03/16 12:44 PM  Result Value Ref Range Status   Specimen Description BLOOD LEFT WRIST  Final   Special Requests BOTTLES DRAWN AEROBIC AND ANAEROBIC 2 CC  Final   Culture NO GROWTH < 24 HOURS  Final   Report Status PENDING  Incomplete  Blood culture (routine x 2)     Status: None (Preliminary result)   Collection Time: 01/03/16  1:32 PM  Result Value Ref Range Status   Specimen Description BLOOD RIGHT HAND  Final   Special Requests BOTTLES DRAWN AEROBIC AND ANAEROBIC  2 CC  Final   Culture NO GROWTH < 24 HOURS  Final   Report Status PENDING  Incomplete  Rapid Influenza A&B Antigens ([place] only)     Status: None   Collection Time: 01/03/16  1:32 PM  Result Value Ref Range Status  Influenza A ([place]) NEGATIVE NEGATIVE Final   Influenza B ([facility]) NEGATIVE NEGATIVE Final  Urine culture     Status: None   Collection Time: 01/03/16  3:13 PM  Result Value Ref Range Status   Specimen Description URINE, CLEAN CATCH  Final   Special Requests NONE  Final   Culture NO GROWTH 1 DAY  Final   Report Status 01/04/2016 FINAL  Final     Studies: Dg Chest 1 View  01/04/2016  CLINICAL DATA:  Fever and chills. EXAM: CHEST 1 VIEW COMPARISON:  01/03/2016. FINDINGS: Mediastinum and hilar structures are normal. Mild right base subsegmental atelectasis and or infiltrate. Mild left base subsegmental atelectasis. No pleural effusion or pneumothorax. IMPRESSION: Mild bibasilar subsegmental atelectasis and/or infiltrates. Electronically Signed   By: Marcello Moores   Register   On: 01/04/2016 09:22   Dg Chest 2 View  01/03/2016  CLINICAL DATA:  Pt reports having a fever intermittently for 10 days. Was diagnosed with UTI by PCP and placed on antibiotic; pt started feeling better so he stopped taking them, but then he began feeling bad again and restarted antibiotics. EXAM: CHEST  2 VIEW COMPARISON:  01/05/2012 FINDINGS: Heart size is upper limits normal. There is minimal atelectasis at the left lung base. There are no focal consolidations. Suspect small bilateral pleural effusions. The posterior costophrenic angles are excluded from the study. There are chronic changes in both shoulders. IMPRESSION: 1. Minimal left lower lobe atelectasis. 2. Suspect small bilateral pleural effusions. Electronically Signed   By: Nolon Nations M.D.   On: 01/03/2016 13:44   Dg Chest Port 1 View  01/04/2016  CLINICAL DATA:  Shortness of breath and weakness. History of coronary artery disease and hypertension. EXAM: PORTABLE CHEST 1 VIEW COMPARISON:  Earlier the same date and 01/03/2016. FINDINGS: 1744 hours. The heart size and mediastinal contours are stable. There are progressive airspace opacities throughout the right lung. The left lung has a stable appearance with mild basilar atelectasis. No pneumothorax or significant pleural effusion identified. There are stable glenohumeral degenerative changes bilaterally. IMPRESSION: Developing asymmetric right lung airspace opacities suspicious for aspiration or asymmetric edema. Electronically Signed   By: Richardean Sale M.D.   On: 01/04/2016 18:01    Scheduled Meds: . amiodarone  400 mg Oral Daily  . aspirin EC  81 mg Oral QHS  . B-complex with vitamin C  1 tablet Oral Daily  . budesonide (PULMICORT) nebulizer solution  0.25 mg Nebulization BID  . enoxaparin (LOVENOX) injection  40 mg Subcutaneous Q24H  . furosemide      . ipratropium-albuterol  3 mL Nebulization Q4H  . levothyroxine  25 mcg Oral Daily  . methylPREDNISolone  (SOLU-MEDROL) injection  60 mg Intravenous Once  . pantoprazole  40 mg Oral Daily  . potassium chloride  40 mEq Oral Once  . simvastatin  40 mg Oral QHS  . sodium chloride flush  3 mL Intravenous Q12H    Assessment/Plan:  1. Acute respiratory failure with hypoxia. Hard to tell this this is fluid overload versus pneumonia. Transferred to CCU stepdown for further clinical monitoring by nursing staff. I asked the respiratory therapist to start BiPAP. I ordered another 40 mg of IV Lasix and stop IV fluids. Change antibiotics to Zosyn just in case this is pneumonia. Also started nebulizer treatments with budesonide and 1 dose of Solu-Medrol since the patient is wheezing.  2. Chest pain. EKG showing left bundle branch block. I will get serial cardiac enzymes and monitor.  Code Status:  Code Status Orders        Start     Ordered   01/03/16 1612  Full code   Continuous     01/03/16 1611    Code Status History    Date Active Date Inactive Code Status Order ID Comments User Context   This patient has a current code status but no historical code status.    Advance Directive Documentation        Most Recent Value   Type of Advance Directive  Living will   Pre-existing out of facility DNR order (yellow form or pink MOST form)     "MOST" Form in Place?       Family Communication: Daughter and wife agree with the plan.  Disposition Plan: To be determined  Consultants:  Cardiology  Antibiotics:  Discontinue Rocephin and start Zosyn and doxycycline  Time spent: 30 minutes. Transfer to critical care unit.    Loletha Grayer  [facility]

## 2016-01-04 NOTE — Progress Notes (Signed)
Centralized telemetry just called saying pt is now in afib, rate 147, very irregular. Pt does not have a history of afib. MD paged. Dr. Estanislado Pandy to put in orders for a one time dose of cardizem. Will continue to monitor. Conley Simmonds, RN

## 2016-01-04 NOTE — Progress Notes (Signed)
Patient complains of shortness of breath and difficulty breathing. Upon assessment patient SPO2 was 82% on 2LNC Resp 33 HR 76 BP 165/76. Lung Sounds course crackles and weezing bilaterally. Normal Saline at 100 ml/hr was stopped. On an amiodarone drip at 16.6. Dr. Benjie Karvonen notified of acute changes. Orders received to give 20 mg of lasix and add duonebs Q4h to MAR. Notify if no changes.

## 2016-01-04 NOTE — Care Management (Signed)
Patient  has been placed on Amiodarone continuous infusion for control of his new onset a fib. Unsure if patient willl require long term anticoagulation.  PT consult is pending.  Therapy today was held due to unstable cardiac status

## 2016-01-05 ENCOUNTER — Inpatient Hospital Stay: Payer: PPO

## 2016-01-05 DIAGNOSIS — I4891 Unspecified atrial fibrillation: Secondary | ICD-10-CM | POA: Diagnosis not present

## 2016-01-05 DIAGNOSIS — N390 Urinary tract infection, site not specified: Secondary | ICD-10-CM | POA: Diagnosis not present

## 2016-01-05 DIAGNOSIS — I10 Essential (primary) hypertension: Secondary | ICD-10-CM | POA: Diagnosis not present

## 2016-01-05 DIAGNOSIS — I25119 Atherosclerotic heart disease of native coronary artery with unspecified angina pectoris: Secondary | ICD-10-CM | POA: Diagnosis not present

## 2016-01-05 LAB — BASIC METABOLIC PANEL
ANION GAP: 13 (ref 5–15)
BUN: 23 mg/dL — AB (ref 6–20)
CHLORIDE: 99 mmol/L — AB (ref 101–111)
CO2: 16 mmol/L — AB (ref 22–32)
Calcium: 7.2 mg/dL — ABNORMAL LOW (ref 8.9–10.3)
Creatinine, Ser: 1.31 mg/dL — ABNORMAL HIGH (ref 0.61–1.24)
GFR calc Af Amer: 52 mL/min — ABNORMAL LOW (ref >60)
GFR, EST NON AFRICAN AMERICAN: 45 mL/min — AB (ref >60)
GLUCOSE: 171 mg/dL — AB (ref 65–99)
POTASSIUM: 4 mmol/L (ref 3.5–5.1)
Sodium: 128 mmol/L — ABNORMAL LOW (ref 135–145)

## 2016-01-05 LAB — MAGNESIUM: MAGNESIUM: 1.7 mg/dL (ref 1.7–2.4)

## 2016-01-05 LAB — T3, FREE: T3 FREE: 1.9 pg/mL — AB (ref 2.0–4.4)

## 2016-01-05 LAB — TROPONIN I: Troponin I: 0.05 ng/mL — ABNORMAL HIGH (ref <0.031)

## 2016-01-05 MED ORDER — SODIUM CHLORIDE 0.9 % IV SOLN
Freq: Once | INTRAVENOUS | Status: AC
  Administered 2016-01-05: 04:00:00 via INTRAVENOUS

## 2016-01-05 MED ORDER — LEVOTHYROXINE SODIUM 50 MCG PO TABS
50.0000 ug | ORAL_TABLET | Freq: Every day | ORAL | Status: DC
  Administered 2016-01-06 – 2016-01-07 (×2): 50 ug via ORAL
  Filled 2016-01-05: qty 2
  Filled 2016-01-05: qty 1

## 2016-01-05 MED ORDER — DEXTROSE 5 % IV SOLN: 500.0000 mg | INTRAVENOUS | Status: DC

## 2016-01-05 MED ORDER — FUROSEMIDE 10 MG/ML IJ SOLN
20.0000 mg | Freq: Every day | INTRAMUSCULAR | Status: DC
  Administered 2016-01-05 – 2016-01-06 (×2): 20 mg via INTRAVENOUS
  Filled 2016-01-05 (×2): qty 2

## 2016-01-05 MED ORDER — LEVOTHYROXINE SODIUM 25 MCG PO TABS
25.0000 ug | ORAL_TABLET | Freq: Once | ORAL | Status: DC
  Filled 2016-01-05: qty 1

## 2016-01-05 MED ORDER — IPRATROPIUM-ALBUTEROL 0.5-2.5 (3) MG/3ML IN SOLN
3.0000 mL | Freq: Two times a day (BID) | RESPIRATORY_TRACT | Status: DC
  Administered 2016-01-05 – 2016-01-07 (×4): 3 mL via RESPIRATORY_TRACT
  Filled 2016-01-05 (×4): qty 3

## 2016-01-05 NOTE — Progress Notes (Signed)
PT Cancellation Note  Patient Details Name: Jason Murphy MRN: [id number] DOB: 1920-06-04   Cancelled Treatment:    Reason Eval/Treat Not Completed: Medical issues which prohibited therapy. Pt transferred to ICU (01-04-16).  Per PT protocol, will need new order for PT.  Will discontinue current PT order.  Mittie Bodo, SPT Mittie Bodo 01/05/2016, 8:18 AM

## 2016-01-05 NOTE — Progress Notes (Signed)
[facility] Encounter Note  Patient: Jason Murphy / Admit Date: 01/03/2016 / Date of Encounter: 01/05/2016, 7:53 AM   Subjective: Patient had respiratory distress of unknown etiology last night. Patient is breathing much better at this time without evidence of further significant concerns. Patient had mild amount of chest pressure of unknown etiology. No evidence of myocardial infarction by a minimal elevation of troponin  Review of Systems: Positive for: Respiratory dressed stress and shortness of breath Negative for: Vision change, hearing change, syncope, dizziness, nausea, vomiting,diarrhea, bloody stool, stomach pain, cough, congestion, diaphoresis, urinary frequency, urinary pain,skin lesions, skin rashes Others previously listed  Objective: Telemetry: Normal sinus rhythm with bundle branch block Physical Exam: Blood pressure 112/62, pulse 54, temperature 98.7 F (37.1 C), temperature source Oral, resp. rate 20, height 5\' 10"  (1.778 m), weight 193 lb 4.8 oz (87.68 kg), SpO2 97 %. Body mass index is 27.74 kg/(m^2). General: Well developed, well nourished, in no acute distress. Head: Normocephalic, atraumatic, sclera non-icteric, no xanthomas, nares are without discharge. Neck: No apparent masses Lungs: Normal respirations with few wheezes, no rhonchi, no rales , some crackles   Heart: Regular rate and rhythm, normal S1 S2, no murmur, no rub, no gallop, PMI is normal size and placement, carotid upstroke normal without bruit, jugular venous pressure normal Abdomen: Soft, non-tender, non-distended with normoactive bowel sounds. No hepatosplenomegaly. Abdominal aorta is normal size without bruit Extremities: No edema, no clubbing, no cyanosis, no ulcers,  Peripheral: 2+ radial, 2+ femoral, 2+ dorsal pedal pulses Neuro: Alert and oriented. Moves all extremities spontaneously. Psych:  Responds to questions appropriately with a normal affect.   Intake/Output Summary  (Last 24 hours) at 01/05/16 0753 Last data filed at 01/05/16 0703  Gross per 24 hour  Intake 813.26 ml  Output   2100 ml  Net -1286.74 ml    Inpatient Medications:  . amiodarone  400 mg Oral Daily  . aspirin EC  81 mg Oral QHS  . B-complex with vitamin C  1 tablet Oral Daily  . budesonide (PULMICORT) nebulizer solution  0.25 mg Nebulization BID  . doxycycline  100 mg Oral Q12H  . enoxaparin (LOVENOX) injection  40 mg Subcutaneous Q24H  . ipratropium-albuterol  3 mL Nebulization Q4H  . levothyroxine  25 mcg Oral Daily  . pantoprazole  40 mg Oral Daily  . piperacillin-tazobactam (ZOSYN)  IV  3.375 g Intravenous Q8H  . simvastatin  40 mg Oral QHS  . sodium chloride flush  3 mL Intravenous Q12H   Infusions:    Labs:  Recent Labs  01/04/16 0506 01/05/16 0044 01/05/16 0444  NA 134* 128*  --   K 4.7 4.0  --   CL 105 99*  --   CO2 24 16*  --   GLUCOSE 119* 171*  --   BUN 27* 23*  --   CREATININE 1.47* 1.31*  --   CALCIUM 8.3* 7.2*  --   MG  --   --  1.7    Recent Labs  01/03/16 1244  AST 72*  ALT 39  ALKPHOS 93  BILITOT 0.9  PROT 7.5  ALBUMIN 3.5    Recent Labs  01/03/16 1244 01/04/16 0506  WBC 8.9 7.5  NEUTROABS 8.2*  --   HGB 14.3 14.1  HCT 42.1 41.6  MCV 94.6 94.1  PLT 246 229    Recent Labs  01/03/16 2134 01/04/16 0506 01/04/16 1936 01/05/16 0044  TROPONINI 0.10* 0.05* 0.06* 0.05*   Invalid input(s): POCBNP  No results for input(s): HGBA1C in the last 72 hours.   Weights: Filed Weights   01/03/16 1233 01/03/16 1700  Weight: 195 lb (88.451 kg) 193 lb 4.8 oz (87.68 kg)     Radiology/Studies:  Dg Chest 1 View  01/04/2016  CLINICAL DATA:  Fever and chills. EXAM: CHEST 1 VIEW COMPARISON:  01/03/2016. FINDINGS: Mediastinum and hilar structures are normal. Mild right base subsegmental atelectasis and or infiltrate. Mild left base subsegmental atelectasis. No pleural effusion or pneumothorax. IMPRESSION: Mild bibasilar subsegmental atelectasis  and/or infiltrates. Electronically Signed   By: Marcello Moores  Register   On: 01/04/2016 09:22   Dg Chest 2 View  01/03/2016  CLINICAL DATA:  Pt reports having a fever intermittently for 10 days. Was diagnosed with UTI by PCP and placed on antibiotic; pt started feeling better so he stopped taking them, but then he began feeling bad again and restarted antibiotics. EXAM: CHEST  2 VIEW COMPARISON:  01/05/2012 FINDINGS: Heart size is upper limits normal. There is minimal atelectasis at the left lung base. There are no focal consolidations. Suspect small bilateral pleural effusions. The posterior costophrenic angles are excluded from the study. There are chronic changes in both shoulders. IMPRESSION: 1. Minimal left lower lobe atelectasis. 2. Suspect small bilateral pleural effusions. Electronically Signed   By: Nolon Nations M.D.   On: 01/03/2016 13:44   Dg Chest Port 1 View  01/04/2016  CLINICAL DATA:  Shortness of breath and weakness. History of coronary artery disease and hypertension. EXAM: PORTABLE CHEST 1 VIEW COMPARISON:  Earlier the same date and 01/03/2016. FINDINGS: 1744 hours. The heart size and mediastinal contours are stable. There are progressive airspace opacities throughout the right lung. The left lung has a stable appearance with mild basilar atelectasis. No pneumothorax or significant pleural effusion identified. There are stable glenohumeral degenerative changes bilaterally. IMPRESSION: Developing asymmetric right lung airspace opacities suspicious for aspiration or asymmetric edema. Electronically Signed   By: Richardean Sale M.D.   On: 01/04/2016 18:01     Assessment and Recommendation  82 y.o. male with the atrial fibrillation with rapid ventricular rate now spontaneously converted to normal sinus rhythm and a normal rhythm at this time bundle branch block with essential hypertension makes hyperlipidemia and sleep apnea with respiratory distress of unknown etiology possibly secondary to  amiodarone use and/or cardiovascular disease without evidence of myocardial infarction. Patient had normal LV function by echocardiogram 1. Begin ambulation and follow for further significant respiratory distress following for possible acute diastolic dysfunction heart failure 2. Further consideration of amiodarone as a side effect, needing discontinuation after above 3. Anticoagulation for further risk reduction in stroke with recurrent atrial fibrillation and would consider Eliquis 4. High intensity cholesterol therapy with simvastatin 5. Further dry treatment options after above  Signed, Serafina Royals M.D. FACC

## 2016-01-05 NOTE — Care Management (Signed)
Patient transferred to icu stepdown for respiratory difficulty 3/20 pm.  During progression rounds discussed patient stable to move out of icu back to 2A.

## 2016-01-05 NOTE — Progress Notes (Signed)
ANTIBIOTIC CONSULT NOTE - INITIAL  Pharmacy Consult for Zosyn  Indication: Aspiration Pneumonia  No Known Allergies  Patient Measurements: Height: 5\' 10"  (177.8 cm) Weight: 193 lb 4.8 oz (87.68 kg) IBW/kg (Calculated) : 73  Vital Signs: Temp: 97.2 F (36.2 C) (03/21 0800) Temp Source: Oral (03/21 0800) BP: 112/60 mmHg (03/21 0900) Pulse Rate: 70 (03/21 0900) Intake/Output from previous day: 03/20 0701 - 03/21 0700 In: 813.3 [P.O.:600; I.V.:213.3] Out: 2100 [Urine:2100] Intake/Output from this shift: Total I/O In: 1243 [P.O.:240; I.V.:3; Other:1000] Out: 0   Labs:  Recent Labs  01/03/16 1244 01/04/16 0506 01/05/16 0044  WBC 8.9 7.5  --   HGB 14.3 14.1  --   PLT 246 229  --   CREATININE 1.66* 1.47* 1.31*   Estimated Creatinine Clearance: 38.5 mL/min (by C-G formula based on Cr of 1.31). No results for input(s): VANCOTROUGH, VANCOPEAK, VANCORANDOM, GENTTROUGH, GENTPEAK, GENTRANDOM, TOBRATROUGH, TOBRAPEAK, TOBRARND, AMIKACINPEAK, AMIKACINTROU, AMIKACIN in the last 72 hours.   Microbiology: Recent Results (from the past 720 hour(s))  Blood culture (routine x 2)     Status: None (Preliminary result)   Collection Time: 01/03/16 12:44 PM  Result Value Ref Range Status   Specimen Description BLOOD LEFT WRIST  Final   Special Requests BOTTLES DRAWN AEROBIC AND ANAEROBIC 2 CC  Final   Culture NO GROWTH 2 DAYS  Final   Report Status PENDING  Incomplete  Blood culture (routine x 2)     Status: None (Preliminary result)   Collection Time: 01/03/16  1:32 PM  Result Value Ref Range Status   Specimen Description BLOOD RIGHT HAND  Final   Special Requests BOTTLES DRAWN AEROBIC AND ANAEROBIC  2 CC  Final   Culture NO GROWTH 2 DAYS  Final   Report Status PENDING  Incomplete  Rapid Influenza A&B Antigens ([facility] only)     Status: None   Collection Time: 01/03/16  1:32 PM  Result Value Ref Range Status   Influenza A ([facility]) NEGATIVE NEGATIVE Final   Influenza B ([facility]) NEGATIVE  NEGATIVE Final  Urine culture     Status: None   Collection Time: 01/03/16  3:13 PM  Result Value Ref Range Status   Specimen Description URINE, CLEAN CATCH  Final   Special Requests NONE  Final   Culture NO GROWTH 1 DAY  Final   Report Status 01/04/2016 FINAL  Final  MRSA PCR Screening     Status: None   Collection Time: 01/04/16  7:20 PM  Result Value Ref Range Status   MRSA by PCR NEGATIVE NEGATIVE Final    Comment:        The GeneXpert MRSA Assay (FDA approved for NASAL specimens only), is one component of a comprehensive MRSA colonization surveillance program. It is not intended to diagnose MRSA infection nor to guide or monitor treatment for MRSA infections.     Medical History: Past Medical History  Diagnosis Date  . Coronary artery disease   . Dizziness     chronic dizziness  . Pneumonia   . Hypercholesterolemia   . Hypertension   . Obstructive sleep apnea   . Cancer [facility])     prostate  . Fatigue     Progressive fatigue with question of sleep apnea  . Dyspnea     Medications:  Scheduled:  . amiodarone  400 mg Oral Daily  . aspirin EC  81 mg Oral QHS  . B-complex with vitamin C  1 tablet Oral Daily  . budesonide (PULMICORT) nebulizer solution  0.25 mg Nebulization BID  . doxycycline  100 mg Oral Q12H  . enoxaparin (LOVENOX) injection  40 mg Subcutaneous Q24H  . furosemide  20 mg Intravenous Daily  . ipratropium-albuterol  3 mL Nebulization Q4H  . levothyroxine  25 mcg Oral Once  . [START ON 01/06/2016] levothyroxine  50 mcg Oral QAC breakfast  . pantoprazole  40 mg Oral Daily  . piperacillin-tazobactam (ZOSYN)  IV  3.375 g Intravenous Q8H  . simvastatin  40 mg Oral QHS  . sodium chloride flush  3 mL Intravenous Q12H   Infusions:   Assessment: 82 yo male with acute respiratory failure with hypoxia, fluid overload vs. Pneumonia.  Zosyn ordered just in case.    Plan:  Continue Zosyn 3.375g EI every 8 hours.  Pharmacy to follow as per  consult.  Napoleon Form, RPh Clinical Pharmacist 01/05/2016,11:41 AM

## 2016-01-05 NOTE — Progress Notes (Signed)
[facility] [place] NAME: Jason Murphy    MR#:  [id number]  DATE OF BIRTH:  24-Feb-1920  SUBJECTIVE:  He had acute resp distress yesterday early evening and was sent to step down unit. He was given LASIX 40 mg and antibiotics were changed to ZOSYN and DOXY  Patient feeling "a whole lot better" this am. SOB improved and denies chest pain/pressure  REVIEW OF SYSTEMS:    Review of Systems  Constitutional: Negative for fever, chills and malaise/fatigue.  HENT: Negative for sore throat.   Eyes: Negative for blurred vision.  Respiratory: Positive for cough. Negative for hemoptysis, shortness of breath and wheezing.   Cardiovascular: Negative for chest pain, palpitations and leg swelling.  Gastrointestinal: Negative for nausea, vomiting, abdominal pain, diarrhea and blood in stool.  Genitourinary: Negative for dysuria.  Musculoskeletal: Negative for back pain.  Neurological: Positive for weakness. Negative for dizziness, tremors and headaches.  Endo/Heme/Allergies: Does not bruise/bleed easily.    Tolerating Diet:yes      DRUG ALLERGIES:  No Known Allergies  VITALS:  Blood pressure 118/69, pulse 52, temperature 97.2 F (36.2 C), temperature source Oral, resp. rate 16, height 5\' 10"  (1.778 m), weight 87.68 kg (193 lb 4.8 oz), SpO2 98 %.  PHYSICAL EXAMINATION:   Physical Exam  Constitutional: He is oriented to person, place, and time and well-developed, well-nourished, and in no distress. No distress.  HENT:  Head: Normocephalic.  Eyes: No scleral icterus.  Neck: Normal range of motion. Neck supple. No JVD present. No tracheal deviation present.  Cardiovascular: Normal rate.  Exam reveals no gallop and no friction rub.   Murmur heard. Irr, irr tachy  Pulmonary/Chest: Effort normal. No respiratory distress. He has no wheezes. He has no rales. He exhibits no tenderness.  Crackles right base  Abdominal: Soft. Bowel sounds are  normal. He exhibits no distension and no mass. There is no tenderness. There is no rebound and no guarding.  Musculoskeletal: Normal range of motion. He exhibits no edema.  Neurological: He is alert and oriented to person, place, and time.  Skin: Skin is warm. No rash noted. No erythema.  Psychiatric: Affect and judgment normal.      LABORATORY PANEL:   CBC  Recent Labs Lab 01/04/16 0506  WBC 7.5  HGB 14.1  HCT 41.6  PLT 229   ------------------------------------------------------------------------------------------------------------------  Chemistries   Recent Labs Lab 01/03/16 1244  01/05/16 0044 01/05/16 0444  NA 130*  < > 128*  --   K 4.2  < > 4.0  --   CL 102  < > 99*  --   CO2 16*  < > 16*  --   GLUCOSE 139*  < > 171*  --   BUN 33*  < > 23*  --   CREATININE 1.66*  < > 1.31*  --   CALCIUM 8.2*  < > 7.2*  --   MG  --   --   --  1.7  AST 72*  --   --   --   ALT 39  --   --   --   ALKPHOS 93  --   --   --   BILITOT 0.9  --   --   --   < > = values in this interval not displayed. ------------------------------------------------------------------------------------------------------------------  Cardiac Enzymes  Recent Labs Lab 01/04/16 0506 01/04/16 1936 01/05/16 0044  TROPONINI 0.05* 0.06* 0.05*   ------------------------------------------------------------------------------------------------------------------  RADIOLOGY:  Dg Chest  1 View  01/05/2016  CLINICAL DATA:  Respiratory distress last night.  UTI. EXAM: CHEST 1 VIEW COMPARISON:  01/04/2016 at 17:44 p.m. and 01/03/2016 FINDINGS: Lungs are adequately inflated demonstrate persistent hazy opacification over the right mid to upper lung with possible slight interval improvement suggesting asymmetric edema or infection. Stable left basilar atelectasis. No effusion. Remainder of the exam is unchanged. IMPRESSION: Slight interval improvement hazy opacification over the right mid to upper lung suggesting  improving asymmetric edema, although cannot exclude infection. Minimal left basilar atelectasis. Electronically Signed   By: Marin Olp M.D.   On: 01/05/2016 10:13   Dg Chest 1 View  01/04/2016  CLINICAL DATA:  Fever and chills. EXAM: CHEST 1 VIEW COMPARISON:  01/03/2016. FINDINGS: Mediastinum and hilar structures are normal. Mild right base subsegmental atelectasis and or infiltrate. Mild left base subsegmental atelectasis. No pleural effusion or pneumothorax. IMPRESSION: Mild bibasilar subsegmental atelectasis and/or infiltrates. Electronically Signed   By: Marcello Moores  Register   On: 01/04/2016 09:22   Dg Chest Port 1 View  01/04/2016  CLINICAL DATA:  Shortness of breath and weakness. History of coronary artery disease and hypertension. EXAM: PORTABLE CHEST 1 VIEW COMPARISON:  Earlier the same date and 01/03/2016. FINDINGS: 1744 hours. The heart size and mediastinal contours are stable. There are progressive airspace opacities throughout the right lung. The left lung has a stable appearance with mild basilar atelectasis. No pneumothorax or significant pleural effusion identified. There are stable glenohumeral degenerative changes bilaterally. IMPRESSION: Developing asymmetric right lung airspace opacities suspicious for aspiration or asymmetric edema. Electronically Signed   By: Richardean Sale M.D.   On: 01/04/2016 18:01     ASSESSMENT AND PLAN:    82 year old male with a history of CAD and recent treatment for urinary tract infection on Bactrim who presents with fever and chills and found to have hyponatremia and elevated troponin.  1. Hyponatremia: Sodium level back down this am, possibly from CHF or due to LASIX Will repeat in am. Initially did respond to IVF on admission  2. Acute resp failure with HYPOXIA: This is concerning for CAP and tachycardia induced pulmonary edema. CXR this am shows improvement in edema. Continue LASIX IV and monitor Na and creatinine level. Continue ZOSYN and  DOXY to cover PNA, but can likely change to Levaquin or ROCEPHIN/AZITHRO in am. Continue ISS  3. Elevated troponin on admission: This is due to demand ischemia and not ACS. Troponins back down Trop max was 0.11 now 0.05  3. Fever on admit: Looks like this was from CAP. He is afebrile past 24 hours Influenza A/B negative Blood Cx NTD  4. New Onset Atrial Fibrillation: He is back in NSR. AMIO gtt changed to po AMIO and patient will likely discontinue this in near future as per Dr Nehemiah Massed CHADS score 1 ECHO shows nml EF with mild MR  5. Acute kidney injury: This is due to dehydration with poor by mouth intake. Improved with IVF.  6. Hypothyroidism:TSH elevated, T3 low increased dose of SYNTHROID and will need f/u labs in 4 weeks   7. Hyperlipidemia: Continue Zocor. 8. History of CAD with prior stenting of left circumflex artery in 2008: Continue aspirin therapy and ZOCOR.    [facility] to [facility] and PT consult   Management plans discussed with the patient and he is in agreement.  CODE STATUS: FULL  TOTAL TIME TAKING CARE OF THIS PATIENT: 30 minutes.  D/w Daughter   POSSIBLE D/C 2-3days, DEPENDING ON CLINICAL CONDITION.   Rhyder Bratz  M.D on 01/05/2016 at 1:25 PM  Between 7am to 6pm - Pager - [phone] After 6pm go to [email or web address] - password [email or web address] [facility]  Office  [phone]  CC: Primary care physician; Lynnell Jude, MD  Note: This dictation was prepared with Dragon dictation along with smaller phrase technology. Any transcriptional errors that result from this process are unintentional.

## 2016-01-06 DIAGNOSIS — E871 Hypo-osmolality and hyponatremia: Secondary | ICD-10-CM | POA: Diagnosis not present

## 2016-01-06 DIAGNOSIS — J9601 Acute respiratory failure with hypoxia: Secondary | ICD-10-CM | POA: Diagnosis not present

## 2016-01-06 DIAGNOSIS — R509 Fever, unspecified: Secondary | ICD-10-CM | POA: Diagnosis not present

## 2016-01-06 DIAGNOSIS — I4891 Unspecified atrial fibrillation: Secondary | ICD-10-CM | POA: Diagnosis not present

## 2016-01-06 DIAGNOSIS — I10 Essential (primary) hypertension: Secondary | ICD-10-CM | POA: Diagnosis not present

## 2016-01-06 DIAGNOSIS — I25119 Atherosclerotic heart disease of native coronary artery with unspecified angina pectoris: Secondary | ICD-10-CM | POA: Diagnosis not present

## 2016-01-06 LAB — BASIC METABOLIC PANEL
ANION GAP: 5 (ref 5–15)
BUN: 27 mg/dL — ABNORMAL HIGH (ref 6–20)
CO2: 22 mmol/L (ref 22–32)
Calcium: 8 mg/dL — ABNORMAL LOW (ref 8.9–10.3)
Chloride: 106 mmol/L (ref 101–111)
Creatinine, Ser: 1.03 mg/dL (ref 0.61–1.24)
GFR, EST NON AFRICAN AMERICAN: 60 mL/min — AB (ref >60)
GLUCOSE: 111 mg/dL — AB (ref 65–99)
POTASSIUM: 3.9 mmol/L (ref 3.5–5.1)
Sodium: 133 mmol/L — ABNORMAL LOW (ref 135–145)

## 2016-01-06 MED ORDER — DIPHENHYDRAMINE HCL 25 MG PO CAPS
25.0000 mg | ORAL_CAPSULE | Freq: Once | ORAL | Status: AC
  Administered 2016-01-06: 25 mg via ORAL
  Filled 2016-01-06: qty 1

## 2016-01-06 MED ORDER — NEPHRO-VITE 0.8 MG PO TABS
1.0000 | ORAL_TABLET | Freq: Every day | ORAL | Status: DC
  Administered 2016-01-06 – 2016-01-07 (×2): 1 via ORAL
  Filled 2016-01-06 (×2): qty 1

## 2016-01-06 MED ORDER — LEVOFLOXACIN 500 MG PO TABS
500.0000 mg | ORAL_TABLET | Freq: Every day | ORAL | Status: DC
  Administered 2016-01-06: 500 mg via ORAL
  Filled 2016-01-06: qty 1

## 2016-01-06 MED ORDER — CALCIUM GLUCONATE 10 % IV SOLN
1.0000 g | Freq: Once | INTRAVENOUS | Status: AC
  Administered 2016-01-06: 1 g via INTRAVENOUS
  Filled 2016-01-06 (×2): qty 10

## 2016-01-06 NOTE — Progress Notes (Signed)
[facility] Encounter Note  Patient: Jason Murphy / Admit Date: 01/03/2016 / Date of Encounter: 01/06/2016, 8:58 AM   Subjective:in Respiratory distress improved. Patient does less pulmonary edema. Patient is maintaining normal sinus rhythm. No evidence of chest discomfort or anginal symptoms Review of Systems: Positive for: Respiratory dressed stress and shortness of breath Negative for: Vision change, hearing change, syncope, dizziness, nausea, vomiting,diarrhea, bloody stool, stomach pain, cough, congestion, diaphoresis, urinary frequency, urinary pain,skin lesions, skin rashes Others previously listed  Objective: Telemetry: Normal sinus rhythm with bundle branch block Physical Exam: Blood pressure 111/77, pulse 48, temperature 97.3 F (36.3 C), temperature source Oral, resp. rate 25, height 5\' 10"  (1.778 m), weight 193 lb 4.8 oz (87.68 kg), SpO2 100 %. Body mass index is 27.74 kg/(m^2). General: Well developed, well nourished, in no acute distress. Head: Normocephalic, atraumatic, sclera non-icteric, no xanthomas, nares are without discharge. Neck: No apparent masses Lungs: Normal respirations with few wheezes, some rhonchi, few rales , some crackles   Heart: Regular rate and rhythm, normal S1 S2, no murmur, no rub, no gallop, PMI is normal size and placement, carotid upstroke normal without bruit, jugular venous pressure normal Abdomen: Soft, non-tender, non-distended with normoactive bowel sounds. No hepatosplenomegaly. Abdominal aorta is normal size without bruit Extremities trace edema, no clubbing, no cyanosis, no ulcers,  Peripheral: 2+ radial, 2+ femoral, 2+ dorsal pedal pulses Neuro: Alert and oriented. Moves all extremities spontaneously. Psych:  Responds to questions appropriately with a normal affect.   Intake/Output Summary (Last 24 hours) at 01/06/16 0858 Last data filed at 01/06/16 0734  Gross per 24 hour  Intake   1623 ml  Output    850 ml   Net    773 ml    Inpatient Medications:  . amiodarone  400 mg Oral Daily  . aspirin EC  81 mg Oral QHS  . B-complex with vitamin C  1 tablet Oral Daily  . budesonide (PULMICORT) nebulizer solution  0.25 mg Nebulization BID  . doxycycline  100 mg Oral Q12H  . enoxaparin (LOVENOX) injection  40 mg Subcutaneous Q24H  . furosemide  20 mg Intravenous Daily  . ipratropium-albuterol  3 mL Nebulization BID  . levothyroxine  25 mcg Oral Once  . levothyroxine  50 mcg Oral QAC breakfast  . pantoprazole  40 mg Oral Daily  . piperacillin-tazobactam (ZOSYN)  IV  3.375 g Intravenous Q8H  . simvastatin  40 mg Oral QHS  . sodium chloride flush  3 mL Intravenous Q12H   Infusions:    Labs:  Recent Labs  01/05/16 0044 01/05/16 0444 01/06/16 0319  NA 128*  --  133*  K 4.0  --  3.9  CL 99*  --  106  CO2 16*  --  22  GLUCOSE 171*  --  111*  BUN 23*  --  27*  CREATININE 1.31*  --  1.03  CALCIUM 7.2*  --  8.0*  MG  --  1.7  --     Recent Labs  01/03/16 1244  AST 72*  ALT 39  ALKPHOS 93  BILITOT 0.9  PROT 7.5  ALBUMIN 3.5    Recent Labs  01/03/16 1244 01/04/16 0506  WBC 8.9 7.5  NEUTROABS 8.2*  --   HGB 14.3 14.1  HCT 42.1 41.6  MCV 94.6 94.1  PLT 246 229    Recent Labs  01/03/16 2134 01/04/16 0506 01/04/16 1936 01/05/16 0044  TROPONINI 0.10* 0.05* 0.06* 0.05*   Invalid input(s): POCBNP  No results for input(s): HGBA1C in the last 72 hours.   Weights: Filed Weights   01/03/16 1233 01/03/16 1700  Weight: 195 lb (88.451 kg) 193 lb 4.8 oz (87.68 kg)     Radiology/Studies:  Dg Chest 1 View  01/05/2016  CLINICAL DATA:  Respiratory distress last night.  UTI. EXAM: CHEST 1 VIEW COMPARISON:  01/04/2016 at 17:44 p.m. and 01/03/2016 FINDINGS: Lungs are adequately inflated demonstrate persistent hazy opacification over the right mid to upper lung with possible slight interval improvement suggesting asymmetric edema or infection. Stable left basilar atelectasis. No  effusion. Remainder of the exam is unchanged. IMPRESSION: Slight interval improvement hazy opacification over the right mid to upper lung suggesting improving asymmetric edema, although cannot exclude infection. Minimal left basilar atelectasis. Electronically Signed   By: Marin Olp M.D.   On: 01/05/2016 10:13   Dg Chest 1 View  01/04/2016  CLINICAL DATA:  Fever and chills. EXAM: CHEST 1 VIEW COMPARISON:  01/03/2016. FINDINGS: Mediastinum and hilar structures are normal. Mild right base subsegmental atelectasis and or infiltrate. Mild left base subsegmental atelectasis. No pleural effusion or pneumothorax. IMPRESSION: Mild bibasilar subsegmental atelectasis and/or infiltrates. Electronically Signed   By: Marcello Moores  Register   On: 01/04/2016 09:22   Dg Chest 2 View  01/03/2016  CLINICAL DATA:  Pt reports having a fever intermittently for 10 days. Was diagnosed with UTI by PCP and placed on antibiotic; pt started feeling better so he stopped taking them, but then he began feeling bad again and restarted antibiotics. EXAM: CHEST  2 VIEW COMPARISON:  01/05/2012 FINDINGS: Heart size is upper limits normal. There is minimal atelectasis at the left lung base. There are no focal consolidations. Suspect small bilateral pleural effusions. The posterior costophrenic angles are excluded from the study. There are chronic changes in both shoulders. IMPRESSION: 1. Minimal left lower lobe atelectasis. 2. Suspect small bilateral pleural effusions. Electronically Signed   By: Nolon Nations M.D.   On: 01/03/2016 13:44   Dg Chest Port 1 View  01/04/2016  CLINICAL DATA:  Shortness of breath and weakness. History of coronary artery disease and hypertension. EXAM: PORTABLE CHEST 1 VIEW COMPARISON:  Earlier the same date and 01/03/2016. FINDINGS: 1744 hours. The heart size and mediastinal contours are stable. There are progressive airspace opacities throughout the right lung. The left lung has a stable appearance with mild  basilar atelectasis. No pneumothorax or significant pleural effusion identified. There are stable glenohumeral degenerative changes bilaterally. IMPRESSION: Developing asymmetric right lung airspace opacities suspicious for aspiration or asymmetric edema. Electronically Signed   By: Richardean Sale M.D.   On: 01/04/2016 18:01     Assessment and Recommendation  82 y.o. male with the atrial fibrillation with rapid ventricular rate now spontaneously converted to normal sinus rhythm and a normal rhythm at this time bundle branch block with essential hypertension makes hyperlipidemia and sleep apnea with respiratory distress multifactorial in nature including possible amiodarone use of bronchitis and or diastolic dysfunction congestive heart failure with ejection fraction normal by echocardiogram 1. Begin ambulation and follow for further significant respiratory distress following for possible acute diastolic dysfunction heart failure with the use of the Lasix only as necessary at this time due to concerns of hyponatremia 2. Further consideration of amiodarone as a side effect, needing discontinuation after above and/or early outpatient if noted 3. Anticoagulation for further risk reduction in stroke with recurrent atrial fibrillation and would consider Eliquis at discharge 4. High intensity cholesterol therapy with simvastatin 5. No further  cardiovascular intervention and/or diagnostics necessary at this time  Signed, Serafina Royals M.D. FACC

## 2016-01-06 NOTE — Progress Notes (Signed)
Patient complained of new rash along both arms and back. Last medication given was Levaquin which was just started today. Notified MD and per Dr. Doy Hutching, discontinue Levaquin and give a one time dose of 25mg  of Benadryl. Nursing staff will continue to monitor. Earleen Reaper, RN

## 2016-01-06 NOTE — Progress Notes (Signed)
Patient's QRS interval appeared to be widened from previous glances at the monitor by this RN and HR was dropping into the lower 50's and upper 40's.  Notified Dr. Jannifer Franklin.  EKG, daily labs, and calcium gluconate orders placed.  Patient is easily arousable and oriented x4.  All other vital signs are stable and there are no other signs of distress.  Will continue to monitor.

## 2016-01-06 NOTE — Progress Notes (Signed)
[facility] [place] NAME: Jason Murphy    MR#:  [id number]  DATE OF BIRTH:  1919-11-26  SUBJECTIVE:  Patient feeling "a whole lot better" this am. SOB improved and denies chest pain/pressure.Patient is out of the chair reading newspaper is a whole lot better and wants to go home  REVIEW OF SYSTEMS:    Review of Systems  Constitutional: Negative for fever, chills and malaise/fatigue.  HENT: Negative for sore throat.   Eyes: Negative for blurred vision.  Respiratory: Positive for cough. Negative for hemoptysis, shortness of breath and wheezing.   Cardiovascular: Negative for chest pain, palpitations and leg swelling.  Gastrointestinal: Negative for nausea, vomiting, abdominal pain, diarrhea and blood in stool.  Genitourinary: Negative for dysuria.  Musculoskeletal: Negative for back pain.  Neurological: Positive for weakness. Negative for dizziness, tremors and headaches.  Endo/Heme/Allergies: Does not bruise/bleed easily.   Tolerating Diet:yes DRUG ALLERGIES:  No Known Allergies  VITALS:  Blood pressure 127/96, pulse 62, temperature 97.3 F (36.3 C), temperature source Oral, resp. rate 25, height 5\' 10"  (1.778 m), weight 87.68 kg (193 lb 4.8 oz), SpO2 97 %.  PHYSICAL EXAMINATION:   Physical Exam  Constitutional: He is oriented to person, place, and time and well-developed, well-nourished, and in no distress. No distress.  HENT:  Head: Normocephalic.  Eyes: No scleral icterus.  Neck: Normal range of motion. Neck supple. No JVD present. No tracheal deviation present.  Cardiovascular: Normal rate.  Exam reveals no gallop and no friction rub.   Murmur heard. Irr, irr tachy  Pulmonary/Chest: Effort normal. No respiratory distress. He has no wheezes. He has no rales. He exhibits no tenderness.  Crackles right base  Abdominal: Soft. Bowel sounds are normal. He exhibits no distension and no mass. There is no tenderness. There is  no rebound and no guarding.  Musculoskeletal: Normal range of motion. He exhibits no edema.  Neurological: He is alert and oriented to person, place, and time.  Skin: Skin is warm. No rash noted. No erythema.  Psychiatric: Affect and judgment normal.   LABORATORY PANEL:   CBC  Recent Labs Lab 01/04/16 0506  WBC 7.5  HGB 14.1  HCT 41.6  PLT 229   ------------------------------------------------------------------------------------------------------------------  Chemistries   Recent Labs Lab 01/03/16 1244  01/05/16 0444 01/06/16 0319  NA 130*  < >  --  133*  K 4.2  < >  --  3.9  CL 102  < >  --  106  CO2 16*  < >  --  22  GLUCOSE 139*  < >  --  111*  BUN 33*  < >  --  27*  CREATININE 1.66*  < >  --  1.03  CALCIUM 8.2*  < >  --  8.0*  MG  --   --  1.7  --   AST 72*  --   --   --   ALT 39  --   --   --   ALKPHOS 93  --   --   --   BILITOT 0.9  --   --   --   < > = values in this interval not displayed. ------------------------------------------------------------------------------------------------------------------  Cardiac Enzymes  Recent Labs Lab 01/04/16 0506 01/04/16 1936 01/05/16 0044  TROPONINI 0.05* 0.06* 0.05*   ------------------------------------------------------------------------------------------------------------------  RADIOLOGY:  Dg Chest 1 View  01/05/2016  CLINICAL DATA:  Respiratory distress last night.  UTI. EXAM: CHEST 1 VIEW COMPARISON:  01/04/2016 at  17:44 p.m. and 01/03/2016 FINDINGS: Lungs are adequately inflated demonstrate persistent hazy opacification over the right mid to upper lung with possible slight interval improvement suggesting asymmetric edema or infection. Stable left basilar atelectasis. No effusion. Remainder of the exam is unchanged. IMPRESSION: Slight interval improvement hazy opacification over the right mid to upper lung suggesting improving asymmetric edema, although cannot exclude infection. Minimal left basilar  atelectasis. Electronically Signed   By: Marin Olp M.D.   On: 01/05/2016 10:13   Dg Chest Port 1 View  01/04/2016  CLINICAL DATA:  Shortness of breath and weakness. History of coronary artery disease and hypertension. EXAM: PORTABLE CHEST 1 VIEW COMPARISON:  Earlier the same date and 01/03/2016. FINDINGS: 1744 hours. The heart size and mediastinal contours are stable. There are progressive airspace opacities throughout the right lung. The left lung has a stable appearance with mild basilar atelectasis. No pneumothorax or significant pleural effusion identified. There are stable glenohumeral degenerative changes bilaterally. IMPRESSION: Developing asymmetric right lung airspace opacities suspicious for aspiration or asymmetric edema. Electronically Signed   By: Richardean Sale M.D.   On: 01/04/2016 18:01     ASSESSMENT AND PLAN:   82 year old male with a history of CAD and recent treatment for urinary tract infection on Bactrim who presents with fever and chills and found to have hyponatremia and elevated troponin.  1. Hyponatremia: Sodium level back down this am, possibly from CHF or due to LASIX  2. Acute resp failure with HYPOXIA: This is concerning for CAP and tachycardia induced pulmonary edema. CXR this am shows improvement in edema. Received LASIX IV and monitor Na and creatinine level. Appears hematocrit stable DC Lasix Change to by mouth Levaquin When necessary breathing treatments if needed wean oxygen to room air  3. Elevated troponin on admission: This is due to demand ischemia and not ACS. Troponins back down Trop max was 0.11 now 0.05  3. Fever on admit: Looks like this was from CAP. He is afebrile past 24 hours Influenza A/B negative Blood Cx NTD  4. New Onset Atrial Fibrillation: He is back in NSR. AMIO gtt changed to po AMIO and patient will likely discontinue this in near future as per Dr Nehemiah Massed CHADS score 1 ECHO shows nml EF with mild MR  5. Acute kidney  injury: This is due to dehydration with poor by mouth intake. Improved with IVF.  6. Hypothyroidism:TSH elevated, T3 low increased dose of SYNTHROID and will need f/u labs in 4 weeks  7. Hyperlipidemia: Continue Zocor.  8. History of CAD with prior stenting of left circumflex artery in 2008: Continue aspirin therapy and ZOCOR.  Overall improved. Patient will discharge tomorrow if continues to remain stable.  T Management plans discussed with the patient and he is in agreement.  CODE STATUS: FULL  TOTAL TIME TAKING CARE OF THIS PATIENT: 30 minutes.  D/w Daughter   POSSIBLE D/C 1-2 days, DEPENDING ON CLINICAL CONDITION.   Trea Carnegie M.D on 01/06/2016 at 2:50 PM  Between 7am to 6pm - Pager - [phone] After 6pm go to [email or web address] - password [email or web address] [facility]  Office  [phone]  CC: Primary care physician; Lynnell Jude, MD  Note: This dictation was prepared with Dragon dictation along with smaller phrase technology. Any transcriptional errors that result from this process are unintentional.

## 2016-01-06 NOTE — Evaluation (Signed)
Physical Therapy Evaluation Patient Details Name: HOLDYN BERGAMO MRN: [id number] DOB: [birth year] Today's Date: 01/06/2016   History of Present Illness  Pt is a 82 y.o. male with PMH of CAD, dizziness, pneumonia, prostate cancer and HTN.  Pt presented with dysuria, elevated troponin (due to demand ischemia) and fever.  Pt was admitted to the general floor for hyponatremia (01-03-16).  Pt was moved to the ICU (01-04-16) due to respiratory difficulty.     Clinical Impression  Prior to admission pt was independent.  Pt lives with wife.  Pt was min assist with in bed rolling, mod assist with sidelying to sit (trunk and LE navigation).  Pt required increased time for all bed mobility.  Pt was min assist for sit to stand with RW, with x2 assist for equipment navigation.  Pt was CGA for ambulation with RW for 25 feet and x2 assist  for equipment navigation.  Session was modified due to multiple PVC's during ambulation (nursing notified).  Due to aforementioned function and strength deficits, pt is in need of skilled physical therapy.  It is recommended that pt is discharged to home with familial support, home health PT and assistance with mobility when medically appropriate.    Follow Up Recommendations Home health PT;Supervision for mobility/OOB    Equipment Recommendations  Rolling walker with 5" wheels    Recommendations for Other Services       Precautions / Restrictions Precautions Precautions: Fall Restrictions Weight Bearing Restrictions: No      Mobility  Bed Mobility Overal bed mobility: Needs Assistance Bed Mobility: Rolling;Sidelying to Sit Rolling: Min assist Sidelying to sit: Mod assist (trunk and LE assist )       General bed mobility comments: increased time  Transfers Overall transfer level: Needs assistance Equipment used: Rolling walker (2 wheeled) Transfers: Sit to/from Stand Sit to Stand: +2 safety/equipment;Min assist         General transfer comment:  increased time.  Ambulation/Gait Ambulation/Gait assistance: +2 safety/equipment;Min guard Ambulation Distance (Feet): 25 Feet Assistive device: Rolling walker (2 wheeled) Gait Pattern/deviations: Decreased step length - right;Decreased step length - left Gait velocity: decreased      Stairs            Wheelchair Mobility    Modified Rankin (Stroke Patients Only)       Balance Overall balance assessment: Needs assistance Sitting-balance support: Feet supported Sitting balance-Leahy Scale: Good     Standing balance support: Bilateral upper extremity supported (RW) Standing balance-Leahy Scale: Fair                               Pertinent Vitals/Pain Pain Assessment: No/denies pain Pain Score: 3  Pain Location: L anterior thigh Pain Descriptors / Indicators: Aching;Discomfort;Grimacing Pain Intervention(s): Limited activity within patient's tolerance;Monitored during session;Repositioned  See flow sheet for vitals.    Home Living Family/patient expects to be discharged to:: Private residence Living Arrangements: Spouse/significant other Available Help at Discharge: Family   Home Access: Stairs to enter Entrance Stairs-Rails: Left Entrance Stairs-Number of Steps: 3 Home Layout: One level Home Equipment: None      Prior Function Level of Independence: Independent               Hand Dominance        Extremity/Trunk Assessment   Upper Extremity Assessment: Generalized weakness           Lower Extremity Assessment: Generalized weakness  Cervical / Trunk Assessment: Normal  Communication   Communication: No difficulties  Cognition Arousal/Alertness: Awake/alert Behavior During Therapy: WFL for tasks assessed/performed Overall Cognitive Status: Within Functional Limits for tasks assessed                      General Comments   Nursing was contacted and cleared pt for physical therapy.  Pt was agreeable and  session was modified due to multiple PVC's during ambulation (nursing notified).  Nursing cleared pt to sit in chair without chair alarm.       Exercises        Assessment/Plan    PT Assessment Patient needs continued PT services  PT Diagnosis Generalized weakness   PT Problem List Decreased strength;Decreased activity tolerance;Decreased balance;Decreased mobility  PT Treatment Interventions DME instruction;Gait training;Stair training;Functional mobility training;Therapeutic activities;Therapeutic exercise;Balance training;Patient/family education   PT Goals (Current goals can be found in the Care Plan section) Acute Rehab PT Goals Patient Stated Goal: to go home  PT Goal Formulation: With patient Time For Goal Achievement: 01/20/16 Potential to Achieve Goals: Good    Frequency Min 2X/week   Barriers to discharge        Co-evaluation               End of Session Equipment Utilized During Treatment: Gait belt;Oxygen (1 L/min O2 via nasal cannula ) Activity Tolerance: Treatment limited secondary to medical complications (Comment) (multiple PVC's ) Patient left: in chair;with call bell/phone within reach Nurse Communication: Mobility status         Time: [id number] PT Time Calculation (min) (ACUTE ONLY): 38 min   Charges:         PT G Codes:       Mittie Bodo, SPT Mittie Bodo 01/06/2016, 4:12 PM

## 2016-01-07 DIAGNOSIS — I25119 Atherosclerotic heart disease of native coronary artery with unspecified angina pectoris: Secondary | ICD-10-CM | POA: Diagnosis not present

## 2016-01-07 DIAGNOSIS — I10 Essential (primary) hypertension: Secondary | ICD-10-CM | POA: Diagnosis not present

## 2016-01-07 DIAGNOSIS — E871 Hypo-osmolality and hyponatremia: Secondary | ICD-10-CM | POA: Diagnosis not present

## 2016-01-07 DIAGNOSIS — R509 Fever, unspecified: Secondary | ICD-10-CM | POA: Diagnosis not present

## 2016-01-07 DIAGNOSIS — J9601 Acute respiratory failure with hypoxia: Secondary | ICD-10-CM | POA: Diagnosis not present

## 2016-01-07 DIAGNOSIS — I4891 Unspecified atrial fibrillation: Secondary | ICD-10-CM | POA: Diagnosis not present

## 2016-01-07 MED ORDER — LEVOTHYROXINE SODIUM 25 MCG PO TABS: 50.0000 ug | ORAL_TABLET | ORAL | Status: DC

## 2016-01-07 MED ORDER — METOPROLOL TARTRATE 1 MG/ML IV SOLN
5.0000 mg | Freq: Once | INTRAVENOUS | Status: AC
  Administered 2016-01-07: 5 mg via INTRAVENOUS

## 2016-01-07 MED ORDER — BUDESONIDE 0.25 MG/2ML IN SUSP: 0.2500 mg | Freq: Two times a day (BID) | RESPIRATORY_TRACT | Status: DC

## 2016-01-07 MED ORDER — AMIODARONE HCL 200 MG PO TABS
200.0000 mg | ORAL_TABLET | Freq: Once | ORAL | Status: AC
  Administered 2016-01-07: 200 mg via ORAL

## 2016-01-07 MED ORDER — AMIODARONE HCL 200 MG PO TABS
200.0000 mg | ORAL_TABLET | Freq: Every day | ORAL | Status: DC
  Administered 2016-01-07: 200 mg via ORAL
  Filled 2016-01-07: qty 1

## 2016-01-07 MED ORDER — DOXYCYCLINE HYCLATE 100 MG PO TABS: 100.0000 mg | ORAL_TABLET | Freq: Two times a day (BID) | ORAL | Status: DC

## 2016-01-07 MED ORDER — METOPROLOL TARTRATE 1 MG/ML IV SOLN
INTRAVENOUS | Status: AC
  Administered 2016-01-07: 5 mg
  Filled 2016-01-07: qty 5

## 2016-01-07 MED ORDER — DOXYCYCLINE HYCLATE 100 MG PO TABS
100.0000 mg | ORAL_TABLET | Freq: Two times a day (BID) | ORAL | Status: DC
  Administered 2016-01-07: 100 mg via ORAL
  Filled 2016-01-07: qty 1

## 2016-01-07 MED ORDER — AMIODARONE HCL 200 MG PO TABS: 200.0000 mg | ORAL_TABLET | Freq: Every day | ORAL | Status: DC

## 2016-01-07 NOTE — Progress Notes (Signed)
Patient is now back in sinus rhythm. Had a 2.2 second pause on the monitor at 12:20 and HR dropped down as low as 51. Patient completely asymptomatic and decided to stay sitting up in the chair to eat lunch. Heart rate is currently staying at 58 SB. Dr. Posey Pronto notified of recent events and told me to keep her updated for discharge. Dr. Adolph Pollack paged and stated he would like to see the patient in his office tomorrow and he is still okay with discharge and for patient to go home on the 200mg  of amiodarone. Will follow up with Dr. Posey Pronto. Wife is concerned about patient going home because she states she will be at work all day tomorrow and no one will be there with him for the day.

## 2016-01-07 NOTE — Consult Note (Signed)
   [facility] Inpatient Consult   01/07/2016  Jason Murphy [birth year] [id number]   Patient screened for potential [facility] Management services. Patient is eligible for [place]. Epic reveals there were no identifiable [facility] care management needs at this time. [facility] Care Management services not appropriate at this time. If patient's post hospital needs change please place a [facility] Care Management consult. For questions please contact:   Landyn Buckalew RN, [facility] Liaison  [phone]) Business Mobile [phone]) Toll free office

## 2016-01-07 NOTE — Progress Notes (Signed)
[facility] Encounter Note  Patient: Jason Murphy / Admit Date: 01/03/2016 / Date of Encounter: 01/07/2016, 7:53 AM   Subjective:in  Patient with some bradycardia and some short runs of supraventricular tachycardia yesterday much more controlled at this time. No evidence of congestive heart failure and/or chest discomfort Review of Systems: Positive for: Shortness of breath slowly improving Negative for: Vision change, hearing change, syncope, dizziness, nausea, vomiting,diarrhea, bloody stool, stomach pain, cough, congestion, diaphoresis, urinary frequency, urinary pain,skin lesions, skin rashes Others previously listed  Objective: Telemetry: Normal sinus rhythm with bundle branch block Physical Exam: Blood pressure 104/63, pulse 62, temperature 98.1 F (36.7 C), temperature source Oral, resp. rate 16, height 5\' 10"  (1.778 m), weight 193 lb 4.8 oz (87.68 kg), SpO2 92 %. Body mass index is 27.74 kg/(m^2). General: Well developed, well nourished, in no acute distress. Head: Normocephalic, atraumatic, sclera non-icteric, no xanthomas, nares are without discharge. Neck: No apparent masses Lungs: Normal respirations with few wheezes, some rhonchi, few rales , some crackles   Heart: Regular rate and rhythm, normal S1 S2, no murmur, no rub, no gallop, PMI is normal size and placement, carotid upstroke normal without bruit, jugular venous pressure normal Abdomen: Soft, non-tender, non-distended with normoactive bowel sounds. No hepatosplenomegaly. Abdominal aorta is normal size without bruit Extremities trace edema, no clubbing, no cyanosis, no ulcers,  Peripheral: 2+ radial, 2+ femoral, 2+ dorsal pedal pulses Neuro: Alert and oriented. Moves all extremities spontaneously. Psych:  Responds to questions appropriately with a normal affect.   Intake/Output Summary (Last 24 hours) at 01/07/16 0753 Last data filed at 01/06/16 1500  Gross per 24 hour  Intake    483 ml   Output   1000 ml  Net   -517 ml    Inpatient Medications:  . amiodarone  400 mg Oral Daily  . aspirin EC  81 mg Oral QHS  . b complex-vitamin c-folic acid  1 tablet Oral Daily  . budesonide (PULMICORT) nebulizer solution  0.25 mg Nebulization BID  . doxycycline  100 mg Oral Q12H  . enoxaparin (LOVENOX) injection  40 mg Subcutaneous Q24H  . ipratropium-albuterol  3 mL Nebulization BID  . levothyroxine  25 mcg Oral Once  . levothyroxine  50 mcg Oral QAC breakfast  . pantoprazole  40 mg Oral Daily  . simvastatin  40 mg Oral QHS  . sodium chloride flush  3 mL Intravenous Q12H   Infusions:    Labs:  Recent Labs  01/05/16 0044 01/05/16 0444 01/06/16 0319  NA 128*  --  133*  K 4.0  --  3.9  CL 99*  --  106  CO2 16*  --  22  GLUCOSE 171*  --  111*  BUN 23*  --  27*  CREATININE 1.31*  --  1.03  CALCIUM 7.2*  --  8.0*  MG  --  1.7  --    No results for input(s): AST, ALT, ALKPHOS, BILITOT, PROT, ALBUMIN in the last 72 hours. No results for input(s): WBC, NEUTROABS, HGB, HCT, MCV, PLT in the last 72 hours.  Recent Labs  01/04/16 1936 01/05/16 0044  TROPONINI 0.06* 0.05*   Invalid input(s): POCBNP No results for input(s): HGBA1C in the last 72 hours.   Weights: Filed Weights   01/03/16 1233 01/03/16 1700  Weight: 195 lb (88.451 kg) 193 lb 4.8 oz (87.68 kg)     Radiology/Studies:  Dg Chest 1 View  01/05/2016  CLINICAL DATA:  Respiratory distress last night.  UTI. EXAM: CHEST 1 VIEW COMPARISON:  01/04/2016 at 17:44 p.m. and 01/03/2016 FINDINGS: Lungs are adequately inflated demonstrate persistent hazy opacification over the right mid to upper lung with possible slight interval improvement suggesting asymmetric edema or infection. Stable left basilar atelectasis. No effusion. Remainder of the exam is unchanged. IMPRESSION: Slight interval improvement hazy opacification over the right mid to upper lung suggesting improving asymmetric edema, although cannot exclude  infection. Minimal left basilar atelectasis. Electronically Signed   By: Marin Olp M.D.   On: 01/05/2016 10:13   Dg Chest 1 View  01/04/2016  CLINICAL DATA:  Fever and chills. EXAM: CHEST 1 VIEW COMPARISON:  01/03/2016. FINDINGS: Mediastinum and hilar structures are normal. Mild right base subsegmental atelectasis and or infiltrate. Mild left base subsegmental atelectasis. No pleural effusion or pneumothorax. IMPRESSION: Mild bibasilar subsegmental atelectasis and/or infiltrates. Electronically Signed   By: Marcello Moores  Register   On: 01/04/2016 09:22   Dg Chest 2 View  01/03/2016  CLINICAL DATA:  Pt reports having a fever intermittently for 10 days. Was diagnosed with UTI by PCP and placed on antibiotic; pt started feeling better so he stopped taking them, but then he began feeling bad again and restarted antibiotics. EXAM: CHEST  2 VIEW COMPARISON:  01/05/2012 FINDINGS: Heart size is upper limits normal. There is minimal atelectasis at the left lung base. There are no focal consolidations. Suspect small bilateral pleural effusions. The posterior costophrenic angles are excluded from the study. There are chronic changes in both shoulders. IMPRESSION: 1. Minimal left lower lobe atelectasis. 2. Suspect small bilateral pleural effusions. Electronically Signed   By: Nolon Nations M.D.   On: 01/03/2016 13:44   Dg Chest Port 1 View  01/04/2016  CLINICAL DATA:  Shortness of breath and weakness. History of coronary artery disease and hypertension. EXAM: PORTABLE CHEST 1 VIEW COMPARISON:  Earlier the same date and 01/03/2016. FINDINGS: 1744 hours. The heart size and mediastinal contours are stable. There are progressive airspace opacities throughout the right lung. The left lung has a stable appearance with mild basilar atelectasis. No pneumothorax or significant pleural effusion identified. There are stable glenohumeral degenerative changes bilaterally. IMPRESSION: Developing asymmetric right lung airspace  opacities suspicious for aspiration or asymmetric edema. Electronically Signed   By: Richardean Sale M.D.   On: 01/04/2016 18:01     Assessment and Recommendation  82 y.o. male with the atrial fibrillation with rapid ventricular rate now spontaneously converted to normal sinus rhythm and a normal rhythm at this time bundle branch block with essential hypertension makes hyperlipidemia and sleep apnea with respiratory distress multifactorial in nature including possible amiodarone use of bronchitis and or diastolic dysfunction congestive heart failure with ejection fraction normal by echocardiogram 1. Increase ambulation at this point for improvements of symptoms and adjustments of medication management 2.  Decrease amiodarone to 200 mg each day for possible discharged home and would possibly adjust medication management and/or discontinuation  3. Anticoagulation for further risk reduction in stroke with recurrent atrial fibrillation and would consider Eliquis at discharge 4. High intensity cholesterol therapy with simvastatin 5. No further cardiovascular intervention and/or diagnostics necessary at this time 6. Okay for discharge home from cardiac standpoint with follow-up next week  Signed, Serafina Royals M.D. FACC

## 2016-01-07 NOTE — Care Management (Signed)
Informed by attending patient to discharge home today.  Patient denies need for any follow up post discharge.  Physical therapy recommended patient would benefit from a walker but patient declines.  Notified attending.  There was discussion regarding starting Eliquis but after attending spoke with patient's daughter and patient and decided not to initiate this therapy

## 2016-01-07 NOTE — Progress Notes (Signed)
Able to reach Dr. Adolph Pollack and MD stated to give a 1 time dose of amiodarone 200mg  on top of the 200mg  he received scheduled this AM. MD stated to monitor him and if his heart rate stays controlled this afternoon he can still go home, and schedule a follow up appointment ASAP to see him. Will give dose and continue to monitor. Heart rate is currently staying closer to 100, still in Afib.

## 2016-01-07 NOTE — Care Management Important Message (Signed)
Important Message  Patient Details  Name: Jason Murphy MRN: [id number] Date of Birth: April 27, 1920   Medicare Important Message Given:  Yes    [name] 01/07/2016, 11:16 AM

## 2016-01-07 NOTE — Progress Notes (Signed)
Dr. Adolph Pollack has recommended discharge and Dr. Posey Pronto updated. Daughter [name] updated and stated she will be able to take patient to his appointment with Dr. Adolph Pollack tomorrow. Patient's wife also called and she stated she will come in about an hour to pick the patient up. Will prepare discharge.

## 2016-01-07 NOTE — Progress Notes (Signed)
Discharge instructions given to patient. IV's and tele removed. Prescriptions sent to [facility]. Education given on afib, bradycardia, and new medications. Patient instructed about follow up appointments. Questions answered. Wife is on the way to get him.

## 2016-01-07 NOTE — Progress Notes (Signed)
Heart rate started consistently staying in the 130's and going as high as 148. Dr. Adolph Pollack paged again and MD stated to give a 1 time dose of 5mg  IV metoprolol. Given medication and patient's heart rate is already coming down to the low 100's. Will closely follow BP and heart rate. Patient is asymptomatic. Getting back to bed now from the chair.

## 2016-01-07 NOTE — Progress Notes (Signed)
Notified by CCMD that patient's heart rate has been running from the 1 teens up to the 130's. Not sustaining in the 130's, but is frequently changing. Patient was in NSR in the 60's this AM. Patient is now back in afib. Dr. Adolph Pollack paged, waiting for call back. Patient took 200mg  of amiodarone at 958AM. BP is stable at 127/81. Will follow up if I do not receive a call back.

## 2016-01-07 NOTE — Discharge Summary (Signed)
[facility] [place] NAME: Jason Murphy    MR#:  [id number]  DATE OF BIRTH:  10/15/1920  DATE OF ADMISSION:  01/03/2016 ADMITTING PHYSICIAN: Bettey Costa, MD  DATE OF DISCHARGE: 01/07/16  PRIMARY CARE PHYSICIAN: BLISS, Lynnell Jude, MD    ADMISSION DIAGNOSIS:  Dehydration [E86.0] Weakness [R53.1] Elevated troponin [R79.89]  DISCHARGE DIAGNOSIS:  Hyponatremia resolved Transient A. fib resolved now in sinus rhythm. Acute respiratory failure with hypoxia likely secondary to pulmonary edema and possible pneumonitis  SECONDARY DIAGNOSIS:   Past Medical History  Diagnosis Date  . Coronary artery disease   . Dizziness     chronic dizziness  . Pneumonia   . Hypercholesterolemia   . Hypertension   . Obstructive sleep apnea   . Cancer [facility])     prostate  . Fatigue     Progressive fatigue with question of sleep apnea  . Dyspnea     HOSPITAL COURSE:   82 year old male with a history of CAD and recent treatment for urinary tract infection on Bactrim who presents with fever and chills and found to have hyponatremia and elevated troponin.  1. Hyponatremia: Sodium level back down this am, possibly from CHF or due to LASIX  2. Acute resp failure with HYPOXIA: This is concerning forAcute bronchitis versus pneumonitis and tachycardia induced pulmonary edema. CXR this am shows improvement in edema. Received LASIX IV and monitor Na and creatinine level. Appears hematocrit stable DC Lasix Change to by mouth Levaquin--- switched to by mouth doxycycline since patient developed a rash after taking Levaquin which is resolved Sats are 100% on room air.  3. Elevated troponin on admission: This is due to demand ischemia and not ACS. Troponins back down Trop max was 0.11 now 0.05  3. Fever on admit: Looks like this was from acute bronchitis versus pneumonitis He is afebrile past 24 hours Influenza A/B negative Blood Cx NTD  4. New Onset Atrial  Fibrillation: He is back in NSR. Patient does not have history of A. fib this was verified with patient's daughter [name] who is an Therapist, sports AMIO gtt changed to po AMIO and patient will likely discontinue this in near future as per Dr Nehemiah Massed CHADS score 1 ECHO shows nml EF with mild MR Will discharge patient on amiodarone 200 by mouth daily. Cardiology recommends by mouth eliquis. This was discussed with patient and patient's daughter are not keen on starting any oral anticoagulation at present. Patient is in sinus rhythm at present. If they consider starting it they will discuss this with cardiology  5. Acute kidney injury: This is due to dehydration with poor by mouth intake. Improved with IVF.  6. Hypothyroidism:TSH elevated, T3 low increased dose of SYNTHROID and will need f/u labs in 4 weeks  7. Hyperlipidemia: Continue Zocor.  8. History of CAD with prior stenting of left circumflex artery in 2008: Continue aspirin therapy and ZOCOR.  Overall improved. Patient will discharge today and he is agreeable with the plan  CONSULTS OBTAINED:  Treatment Team:  Corey Skains, MD  DRUG ALLERGIES:  No Known Allergies  DISCHARGE MEDICATIONS:   Current Discharge Medication List    START taking these medications   Details  amiodarone (PACERONE) 200 MG tablet Take 1 tablet (200 mg total) by mouth daily. Qty: 30 tablet, Refills: 30    budesonide (PULMICORT) 0.25 MG/2ML nebulizer solution Take 2 mLs (0.25 mg total) by nebulization 2 (two) times daily. Qty: 60 mL, Refills: 12  doxycycline (VIBRA-TABS) 100 MG tablet Take 1 tablet (100 mg total) by mouth every 12 (twelve) hours. Qty: 10 tablet, Refills: 0      CONTINUE these medications which have CHANGED   Details  levothyroxine (SYNTHROID, LEVOTHROID) 25 MCG tablet Take 2 tablets (50 mcg total) by mouth every morning. Qty: 30 tablet, Refills: 0      CONTINUE these medications which have NOT CHANGED   Details  aspirin EC 81 MG  tablet Take 81 mg by mouth at bedtime.    co-enzyme Q-10 50 MG capsule Take 50 mg by mouth every morning.     cyanocobalamin 500 MCG tablet Take 500 mcg by mouth every morning.    hydrocortisone 2.5 % cream Apply 1 application topically 2 (two) times daily as needed. For itchy skin. Apply to affected area. Refills: 3    lansoprazole (PREVACID) 15 MG capsule Take 15-30 mg by mouth See admin instructions. Take 1 capsule by mouth every other day alternating with 2 capsules (30mg ) by mouth every other day.    simvastatin (ZOCOR) 40 MG tablet TAKE 1 TABLET BY MOUTH EVERY NIGHT AT BEDTIME Qty: 30 tablet, Refills: 5    triamcinolone cream (KENALOG) 0.1 % Apply 1 application topically 2 (two) times daily as needed. For itchy skin. Refills: 3      STOP taking these medications     sulfamethoxazole-trimethoprim (BACTRIM DS,SEPTRA DS) 800-160 MG tablet      meclizine (ANTIVERT) 25 MG tablet         If you experience worsening of your admission symptoms, develop shortness of breath, life threatening emergency, suicidal or homicidal thoughts you must seek medical attention immediately by calling 911 or calling your MD immediately  if symptoms less severe.  You Must read complete instructions/literature along with all the possible adverse reactions/side effects for all the Medicines you take and that have been prescribed to you. Take any new Medicines after you have completely understood and accept all the possible adverse reactions/side effects.   Please note  You were cared for by a hospitalist during your hospital stay. If you have any questions about your discharge medications or the care you received while you were in the hospital after you are discharged, you can call the unit and asked to speak with the hospitalist on call if the hospitalist that took care of you is not available. Once you are discharged, your primary care physician will handle any further medical issues. Please note that  NO REFILLS for any discharge medications will be authorized once you are discharged, as it is imperative that you return to your primary care physician (or establish a relationship with a primary care physician if you do not have one) for your aftercare needs so that they can reassess your need for medications and monitor your lab values. Today   SUBJECTIVE    Doing well and denies any complaints. No left rash bilateral upper extremities and back likely due to Levaquin resolved  VITAL SIGNS:  Blood pressure 104/63, pulse 62, temperature 98.1 F (36.7 C), temperature source Oral, resp. rate 16, height 5\' 10"  (1.778 m), weight 87.68 kg (193 lb 4.8 oz), SpO2 92 %.  I/O:   Intake/Output Summary (Last 24 hours) at 01/07/16 0821 Last data filed at 01/06/16 1500  Gross per 24 hour  Intake    243 ml  Output   1000 ml  Net   -757 ml    PHYSICAL EXAMINATION:  GENERAL:  82 y.o.-year-old patient lying in the  bed with no acute distress.  EYES: Pupils equal, round, reactive to light and accommodation. No scleral icterus. Extraocular muscles intact.  HEENT: Head atraumatic, normocephalic. Oropharynx and nasopharynx clear.  NECK:  Supple, no jugular venous distention. No thyroid enlargement, no tenderness.  LUNGS: Normal breath sounds bilaterally, no wheezing, rales,rhonchi or crepitation. No use of accessory muscles of respiration.  CARDIOVASCULAR: S1, S2 normal. No murmurs, rubs, or gallops.  ABDOMEN: Soft, non-tender, non-distended. Bowel sounds present. No organomegaly or mass.  EXTREMITIES: No pedal edema, cyanosis, or clubbing.  NEUROLOGIC: Cranial nerves II through XII are intact. Muscle strength 5/5 in all extremities. Sensation intact. Gait not checked.  PSYCHIATRIC: The patient is alert and oriented x 3.  SKIN: No obvious rash, lesion, or ulcer.   DATA REVIEW:   CBC   Recent Labs Lab 01/04/16 0506  WBC 7.5  HGB 14.1  HCT 41.6  PLT 229    Chemistries   Recent Labs Lab  01/03/16 1244  01/05/16 0444 01/06/16 0319  NA 130*  < >  --  133*  K 4.2  < >  --  3.9  CL 102  < >  --  106  CO2 16*  < >  --  22  GLUCOSE 139*  < >  --  111*  BUN 33*  < >  --  27*  CREATININE 1.66*  < >  --  1.03  CALCIUM 8.2*  < >  --  8.0*  MG  --   --  1.7  --   AST 72*  --   --   --   ALT 39  --   --   --   ALKPHOS 93  --   --   --   BILITOT 0.9  --   --   --   < > = values in this interval not displayed.  Microbiology Results   Recent Results (from the past 240 hour(s))  Blood culture (routine x 2)     Status: None (Preliminary result)   Collection Time: 01/03/16 12:44 PM  Result Value Ref Range Status   Specimen Description BLOOD LEFT WRIST  Final   Special Requests BOTTLES DRAWN AEROBIC AND ANAEROBIC 2 CC  Final   Culture NO GROWTH 3 DAYS  Final   Report Status PENDING  Incomplete  Blood culture (routine x 2)     Status: None (Preliminary result)   Collection Time: 01/03/16  1:32 PM  Result Value Ref Range Status   Specimen Description BLOOD RIGHT HAND  Final   Special Requests BOTTLES DRAWN AEROBIC AND ANAEROBIC  2 CC  Final   Culture NO GROWTH 3 DAYS  Final   Report Status PENDING  Incomplete  Rapid Influenza A&B Antigens ([place] only)     Status: None   Collection Time: 01/03/16  1:32 PM  Result Value Ref Range Status   Influenza A ([facility]) NEGATIVE NEGATIVE Final   Influenza B ([facility]) NEGATIVE NEGATIVE Final  Urine culture     Status: None   Collection Time: 01/03/16  3:13 PM  Result Value Ref Range Status   Specimen Description URINE, CLEAN CATCH  Final   Special Requests NONE  Final   Culture NO GROWTH 1 DAY  Final   Report Status 01/04/2016 FINAL  Final  MRSA PCR Screening     Status: None   Collection Time: 01/04/16  7:20 PM  Result Value Ref Range Status   MRSA by PCR NEGATIVE NEGATIVE Final  Comment:        The GeneXpert MRSA Assay (FDA approved for NASAL specimens only), is one component of a comprehensive MRSA colonization surveillance  program. It is not intended to diagnose MRSA infection nor to guide or monitor treatment for MRSA infections.     RADIOLOGY:  Dg Chest 1 View  01/05/2016  CLINICAL DATA:  Respiratory distress last night.  UTI. EXAM: CHEST 1 VIEW COMPARISON:  01/04/2016 at 17:44 p.m. and 01/03/2016 FINDINGS: Lungs are adequately inflated demonstrate persistent hazy opacification over the right mid to upper lung with possible slight interval improvement suggesting asymmetric edema or infection. Stable left basilar atelectasis. No effusion. Remainder of the exam is unchanged. IMPRESSION: Slight interval improvement hazy opacification over the right mid to upper lung suggesting improving asymmetric edema, although cannot exclude infection. Minimal left basilar atelectasis. Electronically Signed   By: Marin Olp M.D.   On: 01/05/2016 10:13     Management plans discussed with the patient, family and they are in agreement.  CODE STATUS:     Code Status Orders        Start     Ordered   01/03/16 1612  Full code   Continuous     01/03/16 1611    Code Status History    Date Active Date Inactive Code Status Order ID Comments User Context   This patient has a current code status but no historical code status.    Advance Directive Documentation        Most Recent Value   Type of Advance Directive  Living will   Pre-existing out of facility DNR order (yellow form or pink MOST form)     "MOST" Form in Place?        TOTAL TIME TAKING CARE OF THIS PATIENT:40 minutes.    Ayjah Show M.D on 01/07/2016 at 8:21 AM  Between 7am to 6pm - Pager - [phone] After 6pm go to [email or web address] - password [email or web address] [facility]  Office  [phone]  CC: Primary care physician; Lynnell Jude, MD

## 2016-01-08 DIAGNOSIS — I480 Paroxysmal atrial fibrillation: Secondary | ICD-10-CM | POA: Insufficient documentation

## 2016-01-08 DIAGNOSIS — I2510 Atherosclerotic heart disease of native coronary artery without angina pectoris: Secondary | ICD-10-CM | POA: Diagnosis not present

## 2016-01-08 DIAGNOSIS — R7989 Other specified abnormal findings of blood chemistry: Secondary | ICD-10-CM | POA: Diagnosis not present

## 2016-01-08 DIAGNOSIS — E782 Mixed hyperlipidemia: Secondary | ICD-10-CM | POA: Diagnosis not present

## 2016-01-09 LAB — CULTURE, BLOOD (ROUTINE X 2)
Culture: NO GROWTH
Culture: NO GROWTH

## 2016-01-14 DIAGNOSIS — H2513 Age-related nuclear cataract, bilateral: Secondary | ICD-10-CM | POA: Diagnosis not present

## 2016-01-15 DIAGNOSIS — E782 Mixed hyperlipidemia: Secondary | ICD-10-CM | POA: Diagnosis not present

## 2016-01-15 DIAGNOSIS — I2510 Atherosclerotic heart disease of native coronary artery without angina pectoris: Secondary | ICD-10-CM | POA: Diagnosis not present

## 2016-01-15 DIAGNOSIS — I480 Paroxysmal atrial fibrillation: Secondary | ICD-10-CM | POA: Diagnosis not present

## 2016-02-02 ENCOUNTER — Other Ambulatory Visit: Payer: Self-pay | Admitting: *Deleted

## 2016-02-02 NOTE — Patient Outreach (Signed)
[facility]) Care Management  02/02/2016  CRISTAIN GALENO [birth year] [id number]  Subjective: Telephone call to patient's home number, no answer, left voice mail message, and requested call back.  Objective: Per chart review: Patient hospitalized 01/03/16 - 01/07/16 with dehydration, atrial fibrillation, hypertension, and hyponatremia.  Assessment: Received [facility] referral on 02/01/16.  Referral source: Jettie Booze.   Referral reason: Disease and symptom management.      Services requested: [employer].     ED utilization :  2 visits in the last 6 months.    Patient discharged from hospital on 01/07/16.    Telephone screening pending, patient contact.  Plan: RNCM will call patient for 2nd telephone outreach within 1 week, if no return call from patient.   Preeya Cleckley H. Annia Friendly, BSN, [place] [facility] Telephonic CM Phone: [phone] Fax: [phone]

## 2016-02-03 ENCOUNTER — Other Ambulatory Visit: Payer: Self-pay | Admitting: *Deleted

## 2016-02-03 ENCOUNTER — Ambulatory Visit: Payer: Self-pay | Admitting: *Deleted

## 2016-02-03 NOTE — Patient Outreach (Signed)
[facility]) Care Management  02/03/2016  Jason Murphy 09-08-1920 [id number]   Subjective:  Received VM from patient, states he is returning call. Telephone call to patient's home number, spoke with patient, and HIPAA verified.   Patient states he is doing well.  Discussed [facility] Care Management services and patient in agreement to complete telephone screen.   Patient given RNCM contact number, [facility] main contact number, and 24 hour Nurse Advice line number for future reference.  Patient request call back tomorrow, currently receiving an incoming call that he has been waiting for all day, and has to go take the call.    Objective: Per chart review: Patient hospitalized 01/03/16 - 01/07/16 with dehydration, atrial fibrillation, hypertension, and hyponatremia.   Patient refused [place] Management services on 01/07/16 due to no care management needs.   Assessment: Received [facility] referral on 02/01/16. Referral source: Jettie Booze. Referral reason: Disease and symptom management. Services requested: [employer]. ED utilization : 2 visits in the last 6 months. Patient discharged from hospital on 01/07/16. Telephone screening pending, patient contact.  Plan: RNCM will call patient for 3rd telephone outreach within 1 week, if no return call from patient.   [name] H. Annia Friendly, BSN, [place] [facility] Telephonic CM Phone: [phone] Fax: [phone]

## 2016-02-04 ENCOUNTER — Other Ambulatory Visit: Payer: Self-pay | Admitting: *Deleted

## 2016-02-04 NOTE — Patient Outreach (Signed)
[facility]) Care Management  02/04/2016  Jason Murphy 1919-11-09 [id number]   Subjective:  Telephone call to patient's home number, spoke with patient, and verified HIPAA.   Discussed [facility] Care Management services and patient in agreement to complete telephone screening on 4/21/7.   Patient agreement to a call back for telephone screen completion.    Objective: Per chart review: Patient hospitalized 01/03/16 - 01/07/16 with dehydration, atrial fibrillation, hypertension, and hyponatremia. Patient refused [facility] Management services on 01/07/16 due to no care management needs.   Assessment: Received [facility] referral on 02/01/16. Referral source: Jettie Booze. Referral reason: Disease and symptom management. Services requested: [employer]. ED utilization : 2 visits in the last 6 months. Patient discharged from hospital on 01/07/16. Telephone screening pending, patient contact.  Plan: RNCM will call patient for 4th telephone outreach within 1 week, if no return call from patient.   Cabrina Shiroma H. Annia Friendly, BSN, [place] Management [facility] Telephonic CM Phone: [phone] Fax: [phone]

## 2016-02-05 ENCOUNTER — Encounter: Payer: Self-pay | Admitting: *Deleted

## 2016-02-05 ENCOUNTER — Other Ambulatory Visit: Payer: Self-pay | Admitting: *Deleted

## 2016-02-05 NOTE — Patient Outreach (Addendum)
[facility]) Care Management  02/05/2016  Jason Murphy 11-03-1919 [id number]  Subjective: Telephone call to patient's home number, spoke with patient, and HIPAA verified.  Patient states he is doing well and was expecting RNCM call back.   Discussed [facility] Care Management services and patient agreed to complete telephone screen.  Patient gave verbal authorization to speak with wife Jason Murphy) and daughter Jason Murphy) who are both nurses, regarding his healthcare needs as needed.  Patient states he does not have any pharmacy, community resource, or care coordination needs at this time.   Patient states he needs additional information regarding dizziness related to non-headache migraines.   Patient states he was diagnosed with non-migraine headaches several years ago at the [facility].   States his most recent migraine was 2 hours ago.  States he has not had in falls and voiced understanding to RNCM's education related to fall prevention.   Patient in agreement to receive information regarding dizziness and migraines.   Patient in agreement to continue to receive [facility] Management services for disease education and symptom management.  Objective: Per chart review: Patient hospitalized 01/03/16 - 01/07/16 with dehydration, atrial fibrillation, hypertension, and hyponatremia. Patient refused [place] Management services on 01/07/16 due to no care management needs.   Assessment: Received [facility] referral on 02/01/16. Referral source: Jettie Booze. Referral reason: Disease and symptom management. Services requested: [employer]. ED utilization : 2 visits in the last 6 months. Patient discharged from hospital on 01/07/16. Telephone screening completed.  Patient will receive disease education and symptom management related to migraines.   Plan: RNCM will send patient successful outreach letter, [facility] pamphlet, magnet, and educational materials  (EMMI Handout Migraine, Dizziness).  RNCM will send [name] at [facility] Management, request to change case status from pending to active.  RNCM will call patient for telephone outreach, follow up on educational materials and assess if future outreach needed within 3 weeks.     Lissett Favorite H. Annia Friendly, BSN, [place] Management [facility] Telephonic CM Phone: [phone] Fax: [phone]

## 2016-02-17 ENCOUNTER — Telehealth: Payer: Self-pay | Admitting: Neurology

## 2016-02-17 NOTE — Telephone Encounter (Signed)
Called pt to make yearly appt. Pt refused state, " I'm feeling good". No appt made

## 2016-02-19 ENCOUNTER — Encounter: Payer: Self-pay | Admitting: *Deleted

## 2016-02-19 ENCOUNTER — Other Ambulatory Visit: Payer: Self-pay | Admitting: *Deleted

## 2016-02-19 NOTE — Patient Outreach (Addendum)
Jason Murphy [facility]) Care Management  02/19/2016  Jason Murphy 1920/02/22 [id number]  Subjective: Telephone call to patient's home number, spoke with patient, and HIPAA verified.   Patient states he is doing great and has no care management needs at this time.   States he received the educational materials from [facility] and has no questions regarding the materials.   Patient states he has a lot of medical and healthcare support within his family. States his wife who is a Marine scientist just changed jobs and is available as needed.   States he also has 2 daughters that are nurses and a son-in-law that is a Marine scientist.    Patient states he has no care management needs at this time and will call [facility] in the future if needed.  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   Objective: Per chart review: Patient hospitalized 01/03/16 - 01/07/16 with dehydration, atrial fibrillation, hypertension, and hyponatremia. Patient refused [place] Management services on 01/07/16 due to no care management needs.  Patient has refused yearly follow up appointment with neurologist and stated that he is feeling good.   Assessment: Received [facility] referral on 02/01/16. Referral source: Jettie Booze. Referral reason: Disease and symptom management. Services requested: [employer]. ED utilization : 2 visits in the last 6 months. Patient discharged from hospital on 01/07/16. Telephone screening completed.  Education materials follow up completed and no care management needs at this time.  Plan: RNCM will send patient's primary MD case closure letter due to no care management needs.  RNCM will send Jason Murphy at [place] Management, request to close case due to no care management needs.    Jason Murphy, BSN, [place] Telephonic CM Phone: [phone]

## 2016-05-30 DIAGNOSIS — Z6829 Body mass index (BMI) 29.0-29.9, adult: Secondary | ICD-10-CM | POA: Diagnosis not present

## 2016-05-30 DIAGNOSIS — G2581 Restless legs syndrome: Secondary | ICD-10-CM | POA: Diagnosis not present

## 2016-07-20 DIAGNOSIS — R490 Dysphonia: Secondary | ICD-10-CM | POA: Diagnosis not present

## 2016-07-20 DIAGNOSIS — J342 Deviated nasal septum: Secondary | ICD-10-CM | POA: Diagnosis not present

## 2016-07-20 DIAGNOSIS — J301 Allergic rhinitis due to pollen: Secondary | ICD-10-CM | POA: Diagnosis not present

## 2016-07-20 DIAGNOSIS — G4733 Obstructive sleep apnea (adult) (pediatric): Secondary | ICD-10-CM | POA: Diagnosis not present

## 2016-08-10 DIAGNOSIS — R079 Chest pain, unspecified: Secondary | ICD-10-CM | POA: Diagnosis not present

## 2016-08-10 DIAGNOSIS — Z23 Encounter for immunization: Secondary | ICD-10-CM | POA: Diagnosis not present

## 2016-08-10 DIAGNOSIS — E039 Hypothyroidism, unspecified: Secondary | ICD-10-CM | POA: Diagnosis not present

## 2016-08-10 DIAGNOSIS — Z6830 Body mass index (BMI) 30.0-30.9, adult: Secondary | ICD-10-CM | POA: Diagnosis not present

## 2016-08-11 ENCOUNTER — Ambulatory Visit: Payer: PPO | Admitting: Nurse Practitioner

## 2016-08-19 DIAGNOSIS — E782 Mixed hyperlipidemia: Secondary | ICD-10-CM | POA: Diagnosis not present

## 2016-08-19 DIAGNOSIS — I2510 Atherosclerotic heart disease of native coronary artery without angina pectoris: Secondary | ICD-10-CM | POA: Diagnosis not present

## 2016-08-19 DIAGNOSIS — I480 Paroxysmal atrial fibrillation: Secondary | ICD-10-CM | POA: Diagnosis not present

## 2016-12-05 DIAGNOSIS — G4733 Obstructive sleep apnea (adult) (pediatric): Secondary | ICD-10-CM | POA: Diagnosis not present

## 2016-12-08 DIAGNOSIS — I480 Paroxysmal atrial fibrillation: Secondary | ICD-10-CM | POA: Diagnosis not present

## 2017-02-16 DIAGNOSIS — J342 Deviated nasal septum: Secondary | ICD-10-CM | POA: Diagnosis not present

## 2017-02-16 DIAGNOSIS — R040 Epistaxis: Secondary | ICD-10-CM | POA: Diagnosis not present

## 2017-03-01 DIAGNOSIS — L821 Other seborrheic keratosis: Secondary | ICD-10-CM | POA: Diagnosis not present

## 2017-03-01 DIAGNOSIS — L304 Erythema intertrigo: Secondary | ICD-10-CM | POA: Diagnosis not present

## 2017-03-01 DIAGNOSIS — L281 Prurigo nodularis: Secondary | ICD-10-CM | POA: Diagnosis not present

## 2017-03-15 DIAGNOSIS — Z6832 Body mass index (BMI) 32.0-32.9, adult: Secondary | ICD-10-CM | POA: Diagnosis not present

## 2017-03-15 DIAGNOSIS — E039 Hypothyroidism, unspecified: Secondary | ICD-10-CM | POA: Diagnosis not present

## 2017-03-15 DIAGNOSIS — Z0001 Encounter for general adult medical examination with abnormal findings: Secondary | ICD-10-CM | POA: Diagnosis not present

## 2017-03-15 DIAGNOSIS — R040 Epistaxis: Secondary | ICD-10-CM | POA: Diagnosis not present

## 2017-03-15 DIAGNOSIS — I2510 Atherosclerotic heart disease of native coronary artery without angina pectoris: Secondary | ICD-10-CM | POA: Diagnosis not present

## 2017-03-15 DIAGNOSIS — E782 Mixed hyperlipidemia: Secondary | ICD-10-CM | POA: Diagnosis not present

## 2017-03-15 DIAGNOSIS — I10 Essential (primary) hypertension: Secondary | ICD-10-CM | POA: Diagnosis not present

## 2017-03-15 DIAGNOSIS — Z79899 Other long term (current) drug therapy: Secondary | ICD-10-CM | POA: Diagnosis not present

## 2017-03-15 DIAGNOSIS — G2581 Restless legs syndrome: Secondary | ICD-10-CM | POA: Diagnosis not present

## 2017-03-15 DIAGNOSIS — M79672 Pain in left foot: Secondary | ICD-10-CM | POA: Diagnosis not present

## 2017-05-18 DIAGNOSIS — Z961 Presence of intraocular lens: Secondary | ICD-10-CM | POA: Diagnosis not present

## 2017-05-18 DIAGNOSIS — H524 Presbyopia: Secondary | ICD-10-CM | POA: Diagnosis not present

## 2017-06-13 DIAGNOSIS — M722 Plantar fascial fibromatosis: Secondary | ICD-10-CM | POA: Diagnosis not present

## 2017-06-13 DIAGNOSIS — M79672 Pain in left foot: Secondary | ICD-10-CM | POA: Diagnosis not present

## 2017-08-18 DIAGNOSIS — Z961 Presence of intraocular lens: Secondary | ICD-10-CM | POA: Diagnosis not present

## 2017-08-28 DIAGNOSIS — G4733 Obstructive sleep apnea (adult) (pediatric): Secondary | ICD-10-CM | POA: Diagnosis not present

## 2017-09-18 DIAGNOSIS — G2581 Restless legs syndrome: Secondary | ICD-10-CM | POA: Diagnosis not present

## 2017-09-18 DIAGNOSIS — R42 Dizziness and giddiness: Secondary | ICD-10-CM | POA: Diagnosis not present

## 2017-09-18 DIAGNOSIS — R0789 Other chest pain: Secondary | ICD-10-CM | POA: Diagnosis not present

## 2017-09-21 DIAGNOSIS — G2581 Restless legs syndrome: Secondary | ICD-10-CM | POA: Insufficient documentation

## 2017-09-28 ENCOUNTER — Other Ambulatory Visit: Payer: Self-pay | Admitting: Neurology

## 2017-09-28 DIAGNOSIS — R42 Dizziness and giddiness: Secondary | ICD-10-CM

## 2017-10-06 ENCOUNTER — Ambulatory Visit: Payer: PPO

## 2017-10-18 DIAGNOSIS — I10 Essential (primary) hypertension: Secondary | ICD-10-CM | POA: Insufficient documentation

## 2017-10-18 DIAGNOSIS — I2510 Atherosclerotic heart disease of native coronary artery without angina pectoris: Secondary | ICD-10-CM | POA: Insufficient documentation

## 2017-11-08 NOTE — Progress Notes (Signed)
Jason Murphy Date of Birth: 1920/05/01 Medical Record #[id number]  History of Present Illness: Mr. Brine is seen for evaluation of chest pain. He was last seen by me in December 2013.   He has a history of coronary disease and had stenting of the left circumflex artery in 2008. He had cardiac catheterization in 2011 which showed a patent stent. He has a long history of chronic dizziness with extensive cardiac and neurologic evaluation the past. He apparently did have an abnormal tilt table test for a vasopressor response but this is really not consistent with his symptoms of dizziness which lasts for several hours even after he lies down.  He was admitted to [facility] in March 2017 with Afib with RVR. Converted to NSR on amiodarone. Was seen by Dr. Ubaldo Glassing at that time. Per notes Echo showed normal LV function without significant valvular abnormality. He was not anticoagulated due to concern about bleeding risk. More recently seen by Neurology- Dr. Jennings Books in December 2018 and noted to complain of chest pain at that time. Follow up with Cardiology recommended. He does have a history of chronic LBBB. Ecg in December demonstrated NSR.  On follow up today he reports he has some slight chest pain in the left precordial area. This is more frequent and sometimes lasts for over a day. It is not worse with exertion and he has no associated dyspnea. He is still living at home with his wife. Is able to walk a block but stops to rest 2-3 times. He does have restless legs.   Current Outpatient Medications on File Prior to Visit  Medication Sig Dispense Refill  . aspirin EC 81 MG tablet Take 81 mg by mouth at bedtime.    . budesonide (PULMICORT) 0.25 MG/2ML nebulizer solution Take 2 mLs (0.25 mg total) by nebulization 2 (two) times daily. 60 mL 12  . Cholecalciferol (VITAMIN D3) 1000 units CAPS Take 1,000 Units by mouth daily.    Marland Kitchen co-enzyme Q-10 50 MG capsule Take 100 mg by mouth every morning.     .  cyanocobalamin 500 MCG tablet Take 500 mcg by mouth every morning.    . hydrocortisone 2.5 % cream Apply 1 application topically 2 (two) times daily as needed. For itchy skin. Apply to affected area.  3  . lansoprazole (PREVACID) 15 MG capsule Take 15-30 mg by mouth See admin instructions. Take 1 capsule by mouth every other day alternating with 2 capsules (30mg ) by mouth every other day.    . levothyroxine (SYNTHROID, LEVOTHROID) 50 MCG tablet Take 50 mcg by mouth daily before breakfast.    . meclizine (ANTIVERT) 25 MG tablet Take 25 mg by mouth 2 (two) times daily as needed for dizziness.    . Multiple Vitamins-Minerals (PRESERVISION AREDS 2 PO) Take 1 tablet by mouth daily.    . nitroGLYCERIN (NITROSTAT) 0.4 MG SL tablet Place 0.4 mg under the tongue every 5 (five) minutes as needed for chest pain.    . pramipexole (MIRAPEX) 0.5 MG tablet Take 0.5 mg by mouth at bedtime.    . simvastatin (ZOCOR) 40 MG tablet TAKE 1 TABLET BY MOUTH EVERY NIGHT AT BEDTIME 30 tablet 5  . triamcinolone cream (KENALOG) 0.1 % Apply 1 application topically 2 (two) times daily as needed. For itchy skin.  3   No current facility-administered medications on file prior to visit.     No Known Allergies  Past Medical History:  Diagnosis Date  . Cancer ([place])  prostate  . Coronary artery disease   . Dizziness    chronic dizziness  . Dyspnea   . Fatigue    Progressive fatigue with question of sleep apnea  . Hypercholesterolemia   . Hypertension   . Obstructive sleep apnea   . Pneumonia     Past Surgical History:  Procedure Laterality Date  . APPENDECTOMY    . CARDIAC CATHETERIZATION     Ejection Fraction was 60%, stent  . KNEE SURGERY     arthroscopic left knee surgery  . SHOULDER ARTHROSCOPY      Social History   Tobacco Use  Smoking Status Never Smoker  Smokeless Tobacco Never Used    Social History   Substance and Sexual Activity  Alcohol Use Yes   Comment: 1/2 glass wine daily     Family History  Problem Relation Age of Onset  . COPD Mother     Review of Systems: As noted in HPI.  All other systems were reviewed and are negative.  Physical Exam: BP (!) 160/80   Ht 5\' 9"  (1.753 m)   Wt 230 lb (104.3 kg)   BMI 33.97 kg/m  GENERAL:  Well appearing elderly WM in NAD HEENT:  PERRL, EOMI, sclera are clear. Oropharynx is clear. NECK:  No jugular venous distention, carotid upstroke brisk and symmetric, no bruits, no thyromegaly or adenopathy LUNGS:  Clear to auscultation bilaterally CHEST:  Unremarkable HEART:  RRR,  PMI not displaced or sustained,S1 and S2 within normal limits, no S3, no S4: no clicks, no rubs, no murmurs ABD:  Soft, nontender. BS +, no masses or bruits. No hepatomegaly, no splenomegaly EXT:  2 + pulses throughout, no edema, no cyanosis no clubbing SKIN:  Warm and dry.  No rashes NEURO:  Alert and oriented x 3. Cranial nerves II through XII intact. PSYCH:  Cognitively intact     LABORATORY DATA:  ECG today demonstrates normal sinus rhythm with first-degree AV block and a rate of 60 beats per minute. He has a left bundle branch block and left axis deviation. I have personally reviewed and interpreted this study.   Assessment / Plan:  1. Coronary disease with prior stenting of the left circumflex artery in 2008.  Followup cardiac catheterization in 2011 showed nonobstructive disease. Continue aspirin therapy. His current symptoms are atypical for angina since they last all day and are not associated with exertion. I told him to try taking sl Ntg to see if this helps. We will check blood work today to assess any metabolic issues. Given his advanced age I would recommend conservative management.   2. Paroxysmal Afib- resolved.   3. Hypercholesterolemia-continue simvastatin. Check blood work today.  4. Left bundle branch block. Chronic   5. Sleep apnea-on CPAP therapy.

## 2017-11-13 ENCOUNTER — Ambulatory Visit: Payer: PPO | Admitting: Cardiology

## 2017-11-13 ENCOUNTER — Encounter: Payer: Self-pay | Admitting: Cardiology

## 2017-11-13 VITALS — BP 160/80 | HR 60 | Ht 69.0 in | Wt 202.0 lb

## 2017-11-13 DIAGNOSIS — R030 Elevated blood-pressure reading, without diagnosis of hypertension: Secondary | ICD-10-CM | POA: Diagnosis not present

## 2017-11-13 DIAGNOSIS — I480 Paroxysmal atrial fibrillation: Secondary | ICD-10-CM

## 2017-11-13 DIAGNOSIS — R079 Chest pain, unspecified: Secondary | ICD-10-CM | POA: Diagnosis not present

## 2017-11-13 DIAGNOSIS — I447 Left bundle-branch block, unspecified: Secondary | ICD-10-CM | POA: Diagnosis not present

## 2017-11-13 NOTE — Patient Instructions (Signed)
We will check blood work today  Continue your current therapy  Monitor your blood pressure at home- let me know if it stays high  Try taking sl Ntg the next time you have chest pain

## 2017-11-14 LAB — LIPID PANEL W/O CHOL/HDL RATIO
Cholesterol, Total: 124 mg/dL (ref 100–199)
HDL: 38 mg/dL — ABNORMAL LOW (ref >39)
LDL CALC: 53 mg/dL (ref 0–99)
Triglycerides: 167 mg/dL — ABNORMAL HIGH (ref 0–149)
VLDL Cholesterol Cal: 33 mg/dL (ref 5–40)

## 2017-11-14 LAB — CBC WITH DIFFERENTIAL/PLATELET
Basophils Absolute: 0 10*3/uL (ref 0.0–0.2)
Basos: 0 %
EOS (ABSOLUTE): 0.3 10*3/uL (ref 0.0–0.4)
EOS: 4 %
HEMATOCRIT: 40.9 % (ref 37.5–51.0)
HEMOGLOBIN: 14.1 g/dL (ref 13.0–17.7)
IMMATURE GRANS (ABS): 0 10*3/uL (ref 0.0–0.1)
Immature Granulocytes: 0 %
LYMPHS ABS: 2.3 10*3/uL (ref 0.7–3.1)
LYMPHS: 35 %
MCH: 32.1 pg (ref 26.6–33.0)
MCHC: 34.5 g/dL (ref 31.5–35.7)
MCV: 93 fL (ref 79–97)
MONOCYTES: 12 %
Monocytes Absolute: 0.8 10*3/uL (ref 0.1–0.9)
Neutrophils Absolute: 3.2 10*3/uL (ref 1.4–7.0)
Neutrophils: 49 %
Platelets: 259 10*3/uL (ref 150–379)
RBC: 4.39 x10E6/uL (ref 4.14–5.80)
RDW: 14.2 % (ref 12.3–15.4)
WBC: 6.6 10*3/uL (ref 3.4–10.8)

## 2017-11-14 LAB — HEPATIC FUNCTION PANEL
ALT: 36 IU/L (ref 0–44)
AST: 44 IU/L — ABNORMAL HIGH (ref 0–40)
Albumin: 4.3 g/dL (ref 3.2–4.6)
Alkaline Phosphatase: 107 IU/L (ref 39–117)
BILIRUBIN TOTAL: 0.6 mg/dL (ref 0.0–1.2)
BILIRUBIN, DIRECT: 0.17 mg/dL (ref 0.00–0.40)
TOTAL PROTEIN: 7.5 g/dL (ref 6.0–8.5)

## 2017-11-14 LAB — BASIC METABOLIC PANEL
BUN / CREAT RATIO: 18 (ref 10–24)
BUN: 18 mg/dL (ref 10–36)
CO2: 20 mmol/L (ref 20–29)
CREATININE: 0.98 mg/dL (ref 0.76–1.27)
Calcium: 9.1 mg/dL (ref 8.6–10.2)
Chloride: 105 mmol/L (ref 96–106)
GFR calc Af Amer: 75 mL/min/{1.73_m2} (ref >59)
GFR, EST NON AFRICAN AMERICAN: 65 mL/min/{1.73_m2} (ref >59)
Glucose: 88 mg/dL (ref 65–99)
Potassium: 4.3 mmol/L (ref 3.5–5.2)
SODIUM: 142 mmol/L (ref 134–144)

## 2017-11-14 LAB — TSH: TSH: 5.21 u[IU]/mL — ABNORMAL HIGH (ref 0.450–4.500)

## 2017-11-16 ENCOUNTER — Telehealth: Payer: Self-pay | Admitting: Cardiology

## 2017-11-16 NOTE — Telephone Encounter (Signed)
Jason Murphy is calling because he received a updated medication list at his last visit and states that it is very confusing because there are things that were not on his previous list . Would like to speak to someone to clarify for him . Please call   Thanks

## 2017-11-16 NOTE — Telephone Encounter (Signed)
Spoke with pt, he is looking at the medication list he got this week at his appointment and he does not know what budesonide is. Explained according to the chart it was prescribed by dr patel in 2017. He reports he has never taken that medication and would like it removed from his list. Medication removed.

## 2018-01-01 ENCOUNTER — Ambulatory Visit (INDEPENDENT_AMBULATORY_CARE_PROVIDER_SITE_OTHER): Payer: PPO | Admitting: Podiatry

## 2018-01-01 ENCOUNTER — Ambulatory Visit (INDEPENDENT_AMBULATORY_CARE_PROVIDER_SITE_OTHER): Payer: PPO

## 2018-01-01 ENCOUNTER — Encounter: Payer: Self-pay | Admitting: Podiatry

## 2018-01-01 DIAGNOSIS — M722 Plantar fascial fibromatosis: Secondary | ICD-10-CM

## 2018-01-01 DIAGNOSIS — M7662 Achilles tendinitis, left leg: Secondary | ICD-10-CM

## 2018-01-01 MED ORDER — DICLOFENAC SODIUM 1 % TD GEL: 4.0000 g | Freq: Four times a day (QID) | TRANSDERMAL | 3 refills | Status: DC

## 2018-01-01 NOTE — Progress Notes (Signed)
Subjective:  Patient ID: Jason Murphy, male    DOB: [birth year],  MRN: [id number] HPI Chief Complaint  Patient presents with  . Foot Pain    Patient presents today for left heel/achilles pain x years.  He states the pain comes and goes and achilles is tender to touch.  He has not had any treatment for this problem    . New Patient (Initial Visit)    84 y.o. male presents with the above complaint.   Denies fever chills nausea vomiting muscle aches and pains.  Past Medical History:  Diagnosis Date  . Cancer [facility])    prostate  . Coronary artery disease   . Dizziness    chronic dizziness  . Dyspnea   . Fatigue    Progressive fatigue with question of sleep apnea  . Hypercholesterolemia   . Hypertension   . Obstructive sleep apnea   . Pneumonia    Past Surgical History:  Procedure Laterality Date  . APPENDECTOMY    . CARDIAC CATHETERIZATION     Ejection Fraction was 60%, stent  . KNEE SURGERY     arthroscopic left knee surgery  . SHOULDER ARTHROSCOPY      Current Outpatient Medications:  .  aspirin EC 81 MG tablet, Take 81 mg by mouth at bedtime., Disp: , Rfl:  .  Cholecalciferol (VITAMIN D3) 1000 units CAPS, Take 1,000 Units by mouth daily., Disp: , Rfl:  .  co-enzyme Q-10 50 MG capsule, Take 100 mg by mouth every morning. , Disp: , Rfl:  .  cyanocobalamin 500 MCG tablet, Take 500 mcg by mouth every morning., Disp: , Rfl:  .  diclofenac sodium (VOLTAREN) 1 % GEL, Apply 4 g topically 4 (four) times daily., Disp: 100 g, Rfl: 3 .  hydrocortisone 2.5 % cream, Apply 1 application topically 2 (two) times daily as needed. For itchy skin. Apply to affected area., Disp: , Rfl: 3 .  lansoprazole (PREVACID) 15 MG capsule, Take 15-30 mg by mouth See admin instructions. Take 1 capsule by mouth every other day alternating with 2 capsules (30mg ) by mouth every other day., Disp: , Rfl:  .  levothyroxine (SYNTHROID, LEVOTHROID) 50 MCG tablet, Take 50 mcg by mouth daily before breakfast.,  Disp: , Rfl:  .  meclizine (ANTIVERT) 25 MG tablet, Take 25 mg by mouth 2 (two) times daily as needed for dizziness., Disp: , Rfl:  .  Multiple Vitamins-Minerals (PRESERVISION AREDS 2 PO), Take 1 tablet by mouth daily., Disp: , Rfl:  .  nitroGLYCERIN (NITROSTAT) 0.4 MG SL tablet, Place 0.4 mg under the tongue every 5 (five) minutes as needed for chest pain., Disp: , Rfl:  .  pramipexole (MIRAPEX) 0.5 MG tablet, Take 0.5 mg by mouth at bedtime., Disp: , Rfl:  .  simvastatin (ZOCOR) 40 MG tablet, TAKE 1 TABLET BY MOUTH EVERY NIGHT AT BEDTIME, Disp: 30 tablet, Rfl: 5 .  triamcinolone cream (KENALOG) 0.1 %, Apply 1 application topically 2 (two) times daily as needed. For itchy skin., Disp: , Rfl: 3  No Known Allergies Review of Systems Objective:  There were no vitals filed for this visit.  General: Well developed, nourished, in no acute distress, alert and oriented x3   Dermatological: Skin is warm, dry and supple bilateral. Nails x 10 are well maintained; remaining integument appears unremarkable at this time. There are no open sores, no preulcerative lesions, no rash or signs of infection present.  Vascular: Dorsalis Pedis artery and Posterior Tibial artery pedal pulses are 2/4  bilateral with immedate capillary fill time. Pedal hair growth present. No varicosities and no lower extremity edema present bilateral.   Neruologic: Grossly intact via light touch bilateral. Vibratory intact via tuning fork bilateral. Protective threshold with Semmes Wienstein monofilament intact to all pedal sites bilateral. Patellar and Achilles deep tendon reflexes 2+ bilateral. No Babinski or clonus noted bilateral.   Musculoskeletal: No gross boney pedal deformities bilateral. No pain, crepitus, or limitation noted with foot and ankle range of motion bilateral. Muscular strength 5/5 in all groups tested bilateral.  He has pain on palpation of the posterior inferior aspect of the Achilles left.  Gait: Unassisted,  Nonantalgic.    Radiographs:  Radiographs taken today demonstrate no acute findings chronic findings consistent with chronic insertional Achilles tendinitis left.  Retrocalcaneal spur is noted minimal in size.  Assessment & Plan:   Assessment: Chronic Achilles tendinitis left.   Plan: Discussed etiology pathology conservative versus surgical therapies.  At this point discussed in great detail today 3 options including injections oral medications or topical medications.  At this point wrote a prescription for diclofenac gel to be applied 3-4 times a day.  Also provided him with a night splint.  I will follow-up with him in 4-6 weeks.     Charizma Gardiner T. [place], [place]

## 2018-01-17 DIAGNOSIS — R2689 Other abnormalities of gait and mobility: Secondary | ICD-10-CM | POA: Diagnosis not present

## 2018-01-17 DIAGNOSIS — R42 Dizziness and giddiness: Secondary | ICD-10-CM | POA: Diagnosis not present

## 2018-01-17 DIAGNOSIS — G2581 Restless legs syndrome: Secondary | ICD-10-CM | POA: Diagnosis not present

## 2018-01-18 ENCOUNTER — Other Ambulatory Visit: Payer: Self-pay | Admitting: Nurse Practitioner

## 2018-01-18 DIAGNOSIS — R42 Dizziness and giddiness: Secondary | ICD-10-CM

## 2018-01-18 DIAGNOSIS — R2689 Other abnormalities of gait and mobility: Secondary | ICD-10-CM

## 2018-01-25 ENCOUNTER — Ambulatory Visit: Payer: PPO

## 2018-02-11 NOTE — Progress Notes (Deleted)
Jason Murphy Date of Birth: Aug 09, 1920 Medical Record #[id number]  History of Present Illness: Jason Murphy is seen for evaluation of chest pain. He was last seen by me in December 2013.   He has a history of coronary disease and had stenting of the left circumflex artery in 2008. He had cardiac catheterization in 2011 which showed a patent stent. He has a long history of chronic dizziness with extensive cardiac and neurologic evaluation the past. He apparently did have an abnormal tilt table test for a vasopressor response but this is really not consistent with his symptoms of dizziness which lasts for several hours even after he lies down.  He was admitted to [facility] in March 2017 with Afib with RVR. Converted to NSR on amiodarone. Was seen by Dr. Ubaldo Glassing at that time. Per notes Echo showed normal LV function without significant valvular abnormality. He was not anticoagulated due to concern about bleeding risk. More recently seen by Neurology- Dr. Jennings Books in December 2018 and noted to complain of chest pain at that time. Follow up with Cardiology recommended. He does have a history of chronic LBBB. Ecg in December demonstrated NSR.  On follow up today he reports he has some slight chest pain in the left precordial area. This is more frequent and sometimes lasts for over a day. It is not worse with exertion and he has no associated dyspnea. He is still living at home with his wife. Is able to walk a block but stops to rest 2-3 times. He does have restless legs.   Current Outpatient Medications on File Prior to Visit  Medication Sig Dispense Refill  . aspirin EC 81 MG tablet Take 81 mg by mouth at bedtime.    . Cholecalciferol (VITAMIN D3) 1000 units CAPS Take 1,000 Units by mouth daily.    Marland Kitchen co-enzyme Q-10 50 MG capsule Take 100 mg by mouth every morning.     . cyanocobalamin 500 MCG tablet Take 500 mcg by mouth every morning.    . diclofenac sodium (VOLTAREN) 1 % GEL Apply 4 g topically 4 (four) times  daily. 100 g 3  . hydrocortisone 2.5 % cream Apply 1 application topically 2 (two) times daily as needed. For itchy skin. Apply to affected area.  3  . lansoprazole (PREVACID) 15 MG capsule Take 15-30 mg by mouth See admin instructions. Take 1 capsule by mouth every other day alternating with 2 capsules (30mg ) by mouth every other day.    . levothyroxine (SYNTHROID, LEVOTHROID) 50 MCG tablet Take 50 mcg by mouth daily before breakfast.    . meclizine (ANTIVERT) 25 MG tablet Take 25 mg by mouth 2 (two) times daily as needed for dizziness.    . Multiple Vitamins-Minerals (PRESERVISION AREDS 2 PO) Take 1 tablet by mouth daily.    . nitroGLYCERIN (NITROSTAT) 0.4 MG SL tablet Place 0.4 mg under the tongue every 5 (five) minutes as needed for chest pain.    . pramipexole (MIRAPEX) 0.5 MG tablet Take 0.5 mg by mouth at bedtime.    . simvastatin (ZOCOR) 40 MG tablet TAKE 1 TABLET BY MOUTH EVERY NIGHT AT BEDTIME 30 tablet 5  . triamcinolone cream (KENALOG) 0.1 % Apply 1 application topically 2 (two) times daily as needed. For itchy skin.  3   No current facility-administered medications on file prior to visit.     No Known Allergies  Past Medical History:  Diagnosis Date  . Cancer [facility])    prostate  . Coronary  artery disease   . Dizziness    chronic dizziness  . Dyspnea   . Fatigue    Progressive fatigue with question of sleep apnea  . Hypercholesterolemia   . Hypertension   . Obstructive sleep apnea   . Pneumonia     Past Surgical History:  Procedure Laterality Date  . APPENDECTOMY    . CARDIAC CATHETERIZATION     Ejection Fraction was 60%, stent  . KNEE SURGERY     arthroscopic left knee surgery  . SHOULDER ARTHROSCOPY      Social History   Tobacco Use  Smoking Status Never Smoker  Smokeless Tobacco Never Used    Social History   Substance and Sexual Activity  Alcohol Use Yes   Comment: 1/2 glass wine daily    Family History  Problem Relation Age of Onset  . COPD  Mother     Review of Systems: As noted in HPI.  All other systems were reviewed and are negative.  Physical Exam: There were no vitals taken for this visit. GENERAL:  Well appearing elderly WM in NAD HEENT:  PERRL, EOMI, sclera are clear. Oropharynx is clear. NECK:  No jugular venous distention, carotid upstroke brisk and symmetric, no bruits, no thyromegaly or adenopathy LUNGS:  Clear to auscultation bilaterally CHEST:  Unremarkable HEART:  RRR,  PMI not displaced or sustained,S1 and S2 within normal limits, no S3, no S4: no clicks, no rubs, no murmurs ABD:  Soft, nontender. BS +, no masses or bruits. No hepatomegaly, no splenomegaly EXT:  2 + pulses throughout, no edema, no cyanosis no clubbing SKIN:  Warm and dry.  No rashes NEURO:  Alert and oriented x 3. Cranial nerves II through XII intact. PSYCH:  Cognitively intact     LABORATORY DATA:  Lab Results  Component Value Date   WBC 6.6 11/13/2017   HGB 14.1 11/13/2017   HCT 40.9 11/13/2017   PLT 259 11/13/2017   GLUCOSE 88 11/13/2017   CHOL 124 11/13/2017   TRIG 167 (H) 11/13/2017   HDL 38 (L) 11/13/2017   LDLCALC 53 11/13/2017   ALT 36 11/13/2017   AST 44 (H) 11/13/2017   NA 142 11/13/2017   K 4.3 11/13/2017   CL 105 11/13/2017   CREATININE 0.98 11/13/2017   BUN 18 11/13/2017   CO2 20 11/13/2017   TSH 5.210 (H) 11/13/2017     ECG today demonstrates normal sinus rhythm with first-degree AV block and a rate of 60 beats per minute. He has a left bundle branch block and left axis deviation. I have personally reviewed and interpreted this study.   Assessment / Plan:  1. Coronary disease with prior stenting of the left circumflex artery in 2008.  Followup cardiac catheterization in 2011 showed nonobstructive disease. Continue aspirin therapy. His current symptoms are atypical for angina since they last all day and are not associated with exertion. I told him to try taking sl Ntg to see if this helps. We will check  blood work today to assess any metabolic issues. Given his advanced age I would recommend conservative management.   2. Paroxysmal Afib- resolved.   3. Hypercholesterolemia-continue simvastatin. Check blood work today.  4. Left bundle branch block. Chronic   5. Sleep apnea-on CPAP therapy.

## 2018-02-12 ENCOUNTER — Ambulatory Visit: Payer: PPO | Admitting: Cardiology

## 2018-02-13 ENCOUNTER — Encounter: Payer: Self-pay | Admitting: *Deleted

## 2018-02-28 DIAGNOSIS — I2510 Atherosclerotic heart disease of native coronary artery without angina pectoris: Secondary | ICD-10-CM | POA: Diagnosis not present

## 2018-02-28 DIAGNOSIS — E039 Hypothyroidism, unspecified: Secondary | ICD-10-CM | POA: Diagnosis not present

## 2018-02-28 DIAGNOSIS — I480 Paroxysmal atrial fibrillation: Secondary | ICD-10-CM | POA: Diagnosis not present

## 2018-02-28 DIAGNOSIS — K219 Gastro-esophageal reflux disease without esophagitis: Secondary | ICD-10-CM | POA: Diagnosis not present

## 2018-02-28 DIAGNOSIS — G2581 Restless legs syndrome: Secondary | ICD-10-CM | POA: Diagnosis not present

## 2018-02-28 DIAGNOSIS — E782 Mixed hyperlipidemia: Secondary | ICD-10-CM | POA: Diagnosis not present

## 2018-04-13 DIAGNOSIS — G4733 Obstructive sleep apnea (adult) (pediatric): Secondary | ICD-10-CM | POA: Diagnosis not present

## 2018-05-04 DIAGNOSIS — H43813 Vitreous degeneration, bilateral: Secondary | ICD-10-CM | POA: Diagnosis not present

## 2018-05-21 DIAGNOSIS — M5441 Lumbago with sciatica, right side: Secondary | ICD-10-CM | POA: Diagnosis not present

## 2018-05-23 DIAGNOSIS — E039 Hypothyroidism, unspecified: Secondary | ICD-10-CM | POA: Diagnosis not present

## 2018-05-23 DIAGNOSIS — I2510 Atherosclerotic heart disease of native coronary artery without angina pectoris: Secondary | ICD-10-CM | POA: Diagnosis not present

## 2018-05-30 DIAGNOSIS — E039 Hypothyroidism, unspecified: Secondary | ICD-10-CM | POA: Diagnosis not present

## 2018-05-30 DIAGNOSIS — I480 Paroxysmal atrial fibrillation: Secondary | ICD-10-CM | POA: Diagnosis not present

## 2018-05-30 DIAGNOSIS — Z0000 Encounter for general adult medical examination without abnormal findings: Secondary | ICD-10-CM | POA: Diagnosis not present

## 2018-05-30 DIAGNOSIS — E782 Mixed hyperlipidemia: Secondary | ICD-10-CM | POA: Diagnosis not present

## 2018-05-30 DIAGNOSIS — K219 Gastro-esophageal reflux disease without esophagitis: Secondary | ICD-10-CM | POA: Diagnosis not present

## 2018-05-30 DIAGNOSIS — I2510 Atherosclerotic heart disease of native coronary artery without angina pectoris: Secondary | ICD-10-CM | POA: Diagnosis not present

## 2018-05-30 DIAGNOSIS — G2581 Restless legs syndrome: Secondary | ICD-10-CM | POA: Diagnosis not present

## 2018-06-04 DIAGNOSIS — M545 Low back pain: Secondary | ICD-10-CM | POA: Diagnosis not present

## 2018-08-01 DIAGNOSIS — H8190 Unspecified disorder of vestibular function, unspecified ear: Secondary | ICD-10-CM | POA: Diagnosis not present

## 2018-08-08 DIAGNOSIS — H8190 Unspecified disorder of vestibular function, unspecified ear: Secondary | ICD-10-CM | POA: Diagnosis not present

## 2018-08-08 DIAGNOSIS — H903 Sensorineural hearing loss, bilateral: Secondary | ICD-10-CM | POA: Diagnosis not present

## 2018-09-24 DIAGNOSIS — Z125 Encounter for screening for malignant neoplasm of prostate: Secondary | ICD-10-CM | POA: Diagnosis not present

## 2018-09-24 DIAGNOSIS — E039 Hypothyroidism, unspecified: Secondary | ICD-10-CM | POA: Diagnosis not present

## 2018-09-24 DIAGNOSIS — G2581 Restless legs syndrome: Secondary | ICD-10-CM | POA: Diagnosis not present

## 2018-09-24 DIAGNOSIS — I2510 Atherosclerotic heart disease of native coronary artery without angina pectoris: Secondary | ICD-10-CM | POA: Diagnosis not present

## 2018-09-26 DIAGNOSIS — H04123 Dry eye syndrome of bilateral lacrimal glands: Secondary | ICD-10-CM | POA: Diagnosis not present

## 2018-10-01 DIAGNOSIS — K219 Gastro-esophageal reflux disease without esophagitis: Secondary | ICD-10-CM | POA: Diagnosis not present

## 2018-10-01 DIAGNOSIS — E039 Hypothyroidism, unspecified: Secondary | ICD-10-CM | POA: Diagnosis not present

## 2018-10-01 DIAGNOSIS — R972 Elevated prostate specific antigen [PSA]: Secondary | ICD-10-CM | POA: Diagnosis not present

## 2018-10-01 DIAGNOSIS — I2510 Atherosclerotic heart disease of native coronary artery without angina pectoris: Secondary | ICD-10-CM | POA: Diagnosis not present

## 2018-10-01 DIAGNOSIS — E782 Mixed hyperlipidemia: Secondary | ICD-10-CM | POA: Diagnosis not present

## 2018-10-01 DIAGNOSIS — I480 Paroxysmal atrial fibrillation: Secondary | ICD-10-CM | POA: Diagnosis not present

## 2018-10-18 DIAGNOSIS — R42 Dizziness and giddiness: Secondary | ICD-10-CM | POA: Diagnosis not present

## 2018-10-31 DIAGNOSIS — L821 Other seborrheic keratosis: Secondary | ICD-10-CM | POA: Diagnosis not present

## 2018-10-31 DIAGNOSIS — L304 Erythema intertrigo: Secondary | ICD-10-CM | POA: Diagnosis not present

## 2019-04-01 DIAGNOSIS — B356 Tinea cruris: Secondary | ICD-10-CM | POA: Diagnosis not present

## 2019-04-23 DIAGNOSIS — K219 Gastro-esophageal reflux disease without esophagitis: Secondary | ICD-10-CM | POA: Diagnosis not present

## 2019-04-23 DIAGNOSIS — E039 Hypothyroidism, unspecified: Secondary | ICD-10-CM | POA: Diagnosis not present

## 2019-04-30 DIAGNOSIS — E782 Mixed hyperlipidemia: Secondary | ICD-10-CM | POA: Diagnosis not present

## 2019-04-30 DIAGNOSIS — I480 Paroxysmal atrial fibrillation: Secondary | ICD-10-CM | POA: Diagnosis not present

## 2019-04-30 DIAGNOSIS — E039 Hypothyroidism, unspecified: Secondary | ICD-10-CM | POA: Diagnosis not present

## 2019-04-30 DIAGNOSIS — I2510 Atherosclerotic heart disease of native coronary artery without angina pectoris: Secondary | ICD-10-CM | POA: Diagnosis not present

## 2019-04-30 DIAGNOSIS — K219 Gastro-esophageal reflux disease without esophagitis: Secondary | ICD-10-CM | POA: Diagnosis not present

## 2019-04-30 DIAGNOSIS — Z0000 Encounter for general adult medical examination without abnormal findings: Secondary | ICD-10-CM | POA: Diagnosis not present

## 2019-06-13 DIAGNOSIS — G4733 Obstructive sleep apnea (adult) (pediatric): Secondary | ICD-10-CM | POA: Diagnosis not present

## 2019-07-30 DIAGNOSIS — L0100 Impetigo, unspecified: Secondary | ICD-10-CM | POA: Diagnosis not present

## 2019-07-30 DIAGNOSIS — Z23 Encounter for immunization: Secondary | ICD-10-CM | POA: Diagnosis not present

## 2019-07-30 DIAGNOSIS — E782 Mixed hyperlipidemia: Secondary | ICD-10-CM | POA: Diagnosis not present

## 2019-07-30 DIAGNOSIS — E039 Hypothyroidism, unspecified: Secondary | ICD-10-CM | POA: Diagnosis not present

## 2019-07-30 DIAGNOSIS — I480 Paroxysmal atrial fibrillation: Secondary | ICD-10-CM | POA: Diagnosis not present

## 2019-07-30 DIAGNOSIS — K219 Gastro-esophageal reflux disease without esophagitis: Secondary | ICD-10-CM | POA: Diagnosis not present

## 2019-07-30 DIAGNOSIS — I2510 Atherosclerotic heart disease of native coronary artery without angina pectoris: Secondary | ICD-10-CM | POA: Diagnosis not present

## 2019-08-05 DIAGNOSIS — H04123 Dry eye syndrome of bilateral lacrimal glands: Secondary | ICD-10-CM | POA: Diagnosis not present

## 2019-08-19 DIAGNOSIS — H0100A Unspecified blepharitis right eye, upper and lower eyelids: Secondary | ICD-10-CM | POA: Diagnosis not present

## 2019-09-11 DIAGNOSIS — G4733 Obstructive sleep apnea (adult) (pediatric): Secondary | ICD-10-CM | POA: Diagnosis not present

## 2019-10-04 DIAGNOSIS — E039 Hypothyroidism, unspecified: Secondary | ICD-10-CM | POA: Diagnosis not present

## 2019-10-04 DIAGNOSIS — E782 Mixed hyperlipidemia: Secondary | ICD-10-CM | POA: Diagnosis not present

## 2019-10-04 DIAGNOSIS — Z0001 Encounter for general adult medical examination with abnormal findings: Secondary | ICD-10-CM | POA: Diagnosis not present

## 2019-10-04 DIAGNOSIS — K219 Gastro-esophageal reflux disease without esophagitis: Secondary | ICD-10-CM | POA: Diagnosis not present

## 2019-10-04 DIAGNOSIS — I480 Paroxysmal atrial fibrillation: Secondary | ICD-10-CM | POA: Diagnosis not present

## 2019-10-04 DIAGNOSIS — I2510 Atherosclerotic heart disease of native coronary artery without angina pectoris: Secondary | ICD-10-CM | POA: Diagnosis not present

## 2019-12-10 DIAGNOSIS — H04123 Dry eye syndrome of bilateral lacrimal glands: Secondary | ICD-10-CM | POA: Diagnosis not present

## 2019-12-10 DIAGNOSIS — H02889 Meibomian gland dysfunction of unspecified eye, unspecified eyelid: Secondary | ICD-10-CM | POA: Diagnosis not present

## 2019-12-10 DIAGNOSIS — Z961 Presence of intraocular lens: Secondary | ICD-10-CM | POA: Diagnosis not present

## 2019-12-10 DIAGNOSIS — H02886 Meibomian gland dysfunction of left eye, unspecified eyelid: Secondary | ICD-10-CM | POA: Diagnosis not present

## 2019-12-10 DIAGNOSIS — H02883 Meibomian gland dysfunction of right eye, unspecified eyelid: Secondary | ICD-10-CM | POA: Diagnosis not present

## 2019-12-12 DIAGNOSIS — I447 Left bundle-branch block, unspecified: Secondary | ICD-10-CM | POA: Diagnosis not present

## 2019-12-12 DIAGNOSIS — I25118 Atherosclerotic heart disease of native coronary artery with other forms of angina pectoris: Secondary | ICD-10-CM | POA: Diagnosis not present

## 2019-12-12 DIAGNOSIS — E782 Mixed hyperlipidemia: Secondary | ICD-10-CM | POA: Diagnosis not present

## 2019-12-12 DIAGNOSIS — R0789 Other chest pain: Secondary | ICD-10-CM | POA: Diagnosis not present

## 2019-12-27 DIAGNOSIS — E039 Hypothyroidism, unspecified: Secondary | ICD-10-CM | POA: Diagnosis not present

## 2020-01-03 DIAGNOSIS — G2581 Restless legs syndrome: Secondary | ICD-10-CM | POA: Diagnosis not present

## 2020-01-03 DIAGNOSIS — E782 Mixed hyperlipidemia: Secondary | ICD-10-CM | POA: Diagnosis not present

## 2020-01-03 DIAGNOSIS — Z0000 Encounter for general adult medical examination without abnormal findings: Secondary | ICD-10-CM | POA: Diagnosis not present

## 2020-01-03 DIAGNOSIS — E039 Hypothyroidism, unspecified: Secondary | ICD-10-CM | POA: Diagnosis not present

## 2020-01-03 DIAGNOSIS — I25118 Atherosclerotic heart disease of native coronary artery with other forms of angina pectoris: Secondary | ICD-10-CM | POA: Diagnosis not present

## 2020-01-03 DIAGNOSIS — Z79899 Other long term (current) drug therapy: Secondary | ICD-10-CM | POA: Diagnosis not present

## 2020-01-07 DIAGNOSIS — I25118 Atherosclerotic heart disease of native coronary artery with other forms of angina pectoris: Secondary | ICD-10-CM | POA: Diagnosis not present

## 2020-01-07 DIAGNOSIS — I447 Left bundle-branch block, unspecified: Secondary | ICD-10-CM | POA: Diagnosis not present

## 2020-01-22 DIAGNOSIS — E782 Mixed hyperlipidemia: Secondary | ICD-10-CM | POA: Diagnosis not present

## 2020-01-22 DIAGNOSIS — I480 Paroxysmal atrial fibrillation: Secondary | ICD-10-CM | POA: Diagnosis not present

## 2020-01-22 DIAGNOSIS — I2510 Atherosclerotic heart disease of native coronary artery without angina pectoris: Secondary | ICD-10-CM | POA: Diagnosis not present

## 2020-01-22 DIAGNOSIS — I447 Left bundle-branch block, unspecified: Secondary | ICD-10-CM | POA: Diagnosis not present

## 2020-03-15 ENCOUNTER — Encounter: Payer: Self-pay | Admitting: Emergency Medicine

## 2020-03-15 ENCOUNTER — Other Ambulatory Visit: Payer: Self-pay

## 2020-03-15 ENCOUNTER — Ambulatory Visit
Admission: EM | Admit: 2020-03-15 | Discharge: 2020-03-15 | Disposition: A | Discharge status: Home / Self Care | Payer: Medicare HMO | Attending: Family Medicine | Admitting: Family Medicine

## 2020-03-15 DIAGNOSIS — R21 Rash and other nonspecific skin eruption: Secondary | ICD-10-CM

## 2020-03-15 HISTORY — DX: Restless legs syndrome: G25.81

## 2020-03-15 MED ORDER — PREDNISONE 10 MG PO TABS: ORAL_TABLET | ORAL | 0 refills | Status: DC

## 2020-03-15 NOTE — ED Triage Notes (Signed)
Patient in today c/o rash on his face and between his fingers x 2 days. Patient has not tried any OTC medications.

## 2020-03-15 NOTE — Discharge Instructions (Signed)
Medication as prescribed.  Call [facility] dermatology for an appt.  You can also try [facility].  Take care  Dr. Lacinda Axon

## 2020-03-15 NOTE — ED Provider Notes (Signed)
[facility]    CSN: [id number] Arrival date & time: 03/15/20  1256  History   Chief Complaint Chief Complaint  Patient presents with  . Rash   HPI  86 year old male presents with rash.  Started yesterday.  He reports erythematous rash on his face as well as between his fingers on the left hand.  No medications or interventions tried.  No known inciting factor.  No recent changes or exposures.  Denies significant itching.  Denies pain.  Has not seen his primary.  Does not have a dermatologist.  No other complaints.  Past Medical History:  Diagnosis Date  . Cancer [facility])    prostate  . Coronary artery disease   . Dizziness    chronic dizziness  . Dyspnea   . Fatigue    Progressive fatigue with question of sleep apnea  . Hypercholesterolemia   . Hypertension   . Obstructive sleep apnea   . Pneumonia   . Restless leg syndrome     Patient Active Problem List   Diagnosis Date Noted  . Hypertension   . Coronary artery disease   . RLS (restless legs syndrome) 09/21/2017  . Paroxysmal A-fib ([place]) 01/08/2016  . Mixed hyperlipidemia 01/08/2016  . Hyponatremia 01/03/2016  . Anxiety state 02/03/2015  . Stress at home 02/03/2015  . OBSTRUCTIVE SLEEP APNEA 10/22/2010  . PROSTATE CANCER 10/21/2010  . HYPERCHOLESTEROLEMIA 10/21/2010  . HYPERTENSION 10/21/2010  . CORONARY ARTERY DISEASE 10/21/2010  . DIZZINESS, CHRONIC 10/21/2010    Past Surgical History:  Procedure Laterality Date  . APPENDECTOMY    . CARDIAC CATHETERIZATION     Ejection Fraction was 60%, stent  . KNEE SURGERY     arthroscopic left knee surgery  . SHOULDER ARTHROSCOPY         Home Medications    Prior to Admission medications   Medication Sig Start Date End Date Taking? Authorizing Provider  aspirin EC 81 MG tablet Take 81 mg by mouth at bedtime.   Yes [provider]  Cholecalciferol (VITAMIN D3) 1000 units CAPS Take 1,000 Units by mouth daily.   Yes [provider]    co-enzyme Q-10 50 MG capsule Take 100 mg by mouth every morning.    Yes [provider]  cyanocobalamin 500 MCG tablet Take 500 mcg by mouth every morning.   Yes [provider]  lansoprazole (PREVACID) 15 MG capsule Take 15-30 mg by mouth See admin instructions. Take 1 capsule by mouth every other day alternating with 2 capsules (30mg ) by mouth every other day.   Yes [provider]  levothyroxine (SYNTHROID, LEVOTHROID) 50 MCG tablet Take 50 mcg by mouth daily before breakfast.   Yes [provider]  meclizine (ANTIVERT) 25 MG tablet Take 25 mg by mouth 2 (two) times daily as needed for dizziness.   Yes [provider]  Multiple Vitamins-Minerals (PRESERVISION AREDS 2 PO) Take 1 tablet by mouth daily.   Yes [provider]  nitroGLYCERIN (NITROSTAT) 0.4 MG SL tablet Place 0.4 mg under the tongue every 5 (five) minutes as needed for chest pain.   Yes [provider]  rOPINIRole (REQUIP) 1 MG tablet Take 1 mg by mouth 3 (three) times daily. 03/10/20  Yes [provider]  simvastatin (ZOCOR) 40 MG tablet TAKE 1 TABLET BY MOUTH EVERY NIGHT AT BEDTIME 05/26/11  Yes Martinique, Peter M, MD  predniSONE (DELTASONE) 10 MG tablet 50 mg daily x 2 days, then 40 mg daily x 2 days, then 30  mg daily x 2 days, then 20 mg daily x 2 days, then 10 mg daily x 2 days. 03/15/20   Coral Spikes, DO  pramipexole (MIRAPEX) 0.5 MG tablet Take 0.5 mg by mouth at bedtime. 09/18/17 03/15/20  [provider]    Family History Family History  Problem Relation Age of Onset  . COPD Mother     Social History Social History   Tobacco Use  . Smoking status: Never Smoker  . Smokeless tobacco: Never Used  Substance Use Topics  . Alcohol use: Yes    Comment: 1/2 glass wine daily  . Drug use: No     Allergies   Patient has no known allergies.   Review of Systems Review of Systems  Skin: Positive for rash.   Physical Exam Triage Vital  Signs ED Triage Vitals  Enc Vitals Group     BP 03/15/20 1310 (!) 143/63     Pulse Rate 03/15/20 1310 (!) 57     Resp 03/15/20 1310 18     Temp 03/15/20 1310 (!) 97.1 F (36.2 C)     Temp Source 03/15/20 1310 Oral     SpO2 03/15/20 1310 98 %     Weight 03/15/20 1312 190 lb (86.2 kg)     Height 03/15/20 1312 5\' 10"  (1.778 m)     Head Circumference --      Peak Flow --      Pain Score 03/15/20 1311 0     Pain Loc --      Pain Edu? --      Excl. in [place]? --    Updated Vital Signs BP (!) 143/63 (BP Location: Left Arm)   Pulse (!) 57   Temp (!) 97.1 F (36.2 C) (Oral)   Resp 18   Ht 5\' 10"  (1.778 m)   Wt 86.2 kg   SpO2 98%   BMI 27.26 kg/m   Visual Acuity Right Eye Distance:   Left Eye Distance:   Bilateral Distance:    Right Eye Near:   Left Eye Near:    Bilateral Near:     Physical Exam Vitals and nursing note reviewed.  Constitutional:      General: He is not in acute distress.    Appearance: Normal appearance. He is not ill-appearing.  HENT:     Head:      Comments: Patient with several raised areas of erythema at the labeled locations.  No apparent vesicles.  Crosses midline.  No fluctuance. Eyes:     General:        Right eye: No discharge.        Left eye: No discharge.     Conjunctiva/sclera: Conjunctivae normal.  Cardiovascular:     Rate and Rhythm: Regular rhythm. Bradycardia present.  Pulmonary:     Effort: Pulmonary effort is normal.     Breath sounds: Normal breath sounds. No wheezing, rhonchi or rales.  Neurological:     Mental Status: He is alert.  Psychiatric:        Mood and Affect: Mood normal.        Behavior: Behavior normal.    UC Treatments / Results  Labs (all labs ordered are listed, but only abnormal results are displayed) Labs Reviewed - No data to display  EKG   Radiology No results found.  Procedures Procedures (including critical care time)  Medications Ordered in UC Medications - No data to display  Initial  Impression / Assessment and Plan / UC  Course  I have reviewed the triage vital signs and the nursing notes.  Pertinent labs & imaging results that were available during my care of the patient were reviewed by me and considered in my medical decision making (see chart for details).    86 year old male presents with rash.  Uncertain etiology and prognosis at this time.  It is raised and erythematous.  Suspect contact/allergic in nature.  Placing on prednisone.  Advised daughter to arrange appointment with dermatology.  Final Clinical Impressions(s) / UC Diagnoses   Final diagnoses:  Rash and nonspecific skin eruption     Discharge Instructions     Medication as prescribed.  Call [facility] dermatology for an appt.  You can also try [facility].  Take care  Dr. Lacinda Axon    ED Prescriptions    Medication Sig Dispense Auth. Provider   predniSONE (DELTASONE) 10 MG tablet 50 mg daily x 2 days, then 40 mg daily x 2 days, then 30 mg daily x 2 days, then 20 mg daily x 2 days, then 10 mg daily x 2 days. 30 tablet Coral Spikes, DO     PDMP not reviewed this encounter.   Coral Spikes, [place] 03/15/20 1355

## 2020-03-23 ENCOUNTER — Encounter: Payer: Self-pay | Admitting: Urology

## 2020-03-30 DIAGNOSIS — N401 Enlarged prostate with lower urinary tract symptoms: Secondary | ICD-10-CM | POA: Diagnosis not present

## 2020-03-30 DIAGNOSIS — R3914 Feeling of incomplete bladder emptying: Secondary | ICD-10-CM | POA: Diagnosis not present

## 2020-03-30 DIAGNOSIS — R300 Dysuria: Secondary | ICD-10-CM | POA: Diagnosis not present

## 2020-03-30 DIAGNOSIS — L0100 Impetigo, unspecified: Secondary | ICD-10-CM | POA: Diagnosis not present

## 2020-03-30 DIAGNOSIS — N529 Male erectile dysfunction, unspecified: Secondary | ICD-10-CM | POA: Diagnosis not present

## 2020-04-16 DIAGNOSIS — R3914 Feeling of incomplete bladder emptying: Secondary | ICD-10-CM | POA: Diagnosis not present

## 2020-04-16 DIAGNOSIS — I480 Paroxysmal atrial fibrillation: Secondary | ICD-10-CM | POA: Diagnosis not present

## 2020-04-16 DIAGNOSIS — N401 Enlarged prostate with lower urinary tract symptoms: Secondary | ICD-10-CM | POA: Diagnosis not present

## 2020-04-16 DIAGNOSIS — I2510 Atherosclerotic heart disease of native coronary artery without angina pectoris: Secondary | ICD-10-CM | POA: Diagnosis not present

## 2020-04-16 DIAGNOSIS — E039 Hypothyroidism, unspecified: Secondary | ICD-10-CM | POA: Diagnosis not present

## 2020-04-20 DIAGNOSIS — M542 Cervicalgia: Secondary | ICD-10-CM | POA: Diagnosis not present

## 2020-04-23 DIAGNOSIS — H04123 Dry eye syndrome of bilateral lacrimal glands: Secondary | ICD-10-CM | POA: Diagnosis not present

## 2020-04-23 DIAGNOSIS — H0288A Meibomian gland dysfunction right eye, upper and lower eyelids: Secondary | ICD-10-CM | POA: Diagnosis not present

## 2020-04-23 DIAGNOSIS — H0288B Meibomian gland dysfunction left eye, upper and lower eyelids: Secondary | ICD-10-CM | POA: Diagnosis not present

## 2020-04-28 ENCOUNTER — Ambulatory Visit: Payer: Self-pay | Admitting: Adult Health

## 2020-04-28 NOTE — Progress Notes (Signed)
04/29/2020 11:15 AM   Jason Murphy 15-Aug-1920 [id number]  Referring provider: Baxter Hire, MD [place],  [place] [address] Chief Complaint  Patient presents with  . Benign Prostatic Hypertrophy    HPI: Jason Murphy is a 86 y.o. male who has BPH with incomplete bladder emptying, ED  is seen today for evaluation and management . He presents today with his daughter.  Patient saw Dr. Harrel Lemon on 04/16/2020 for a follow up. He quit taking flomax because he felt like it was making him dizzy. Dr. Edwina Barth switched him to finasteride.   He feels like he does not empty his bladder completely. He has worsening urinary symptoms.   This is been going on for the past 6 months and has been relatively stable.   He is sexually active with his current girlfriend. He has concern for his ED. For his erectile dysfunction he noted that he tried Viagra but is also taking Imdur.  This sent him to the emergency room.  He is very concerned today about his sex life.  He reported drinking 1 alcoholic beverage daily.   Never smoked.   PSA was 79.81 in 09/24/2018.  PVR >571 today.  His daughter notes that he had some imaging done in [place] in the remote past, possible for prostate cancer..   He has a good quality of life.  He denies any bone pain.  He is still driving.  He walks fairly good distances every day performs his own ADLs.  Quality of life is very important to him.  PMH: Past Medical History:  Diagnosis Date  . Cancer [facility])    prostate  . Coronary artery disease   . Dizziness    chronic dizziness  . Dyspnea   . Fatigue    Progressive fatigue with question of sleep apnea  . Hypercholesterolemia   . Hypertension   . Obstructive sleep apnea   . Pneumonia   . Restless leg syndrome     Surgical History: Past Surgical History:  Procedure Laterality Date  . APPENDECTOMY    . CARDIAC CATHETERIZATION     Ejection Fraction was 60%, stent  . KNEE SURGERY      arthroscopic left knee surgery  . SHOULDER ARTHROSCOPY      Home Medications:  Allergies as of 04/29/2020   No Known Allergies     Medication List       Accurate as of April 29, 2020 11:15 AM. If you have any questions, ask your nurse or doctor.        aspirin EC 81 MG tablet Take 81 mg by mouth at bedtime.   co-enzyme Q-10 50 MG capsule Take 100 mg by mouth every morning.   isosorbide mononitrate 30 MG 24 hr tablet Commonly known as: IMDUR   lansoprazole 15 MG capsule Commonly known as: PREVACID Take 15-30 mg by mouth See admin instructions. Take 1 capsule by mouth every other day alternating with 2 capsules (30mg ) by mouth every other day.   levothyroxine 50 MCG tablet Commonly known as: SYNTHROID Take 50 mcg by mouth daily before breakfast.   meclizine 25 MG tablet Commonly known as: ANTIVERT Take 25 mg by mouth 2 (two) times daily as needed for dizziness.   nitroGLYCERIN 0.4 MG SL tablet Commonly known as: NITROSTAT Place 0.4 mg under the tongue every 5 (five) minutes as needed for chest pain.   predniSONE 10 MG tablet Commonly known as: DELTASONE 50 mg daily x 2 days,  then 40 mg daily x 2 days, then 30 mg daily x 2 days, then 20 mg daily x 2 days, then 10 mg daily x 2 days.   PRESERVISION AREDS 2 PO Take 1 tablet by mouth daily.   rOPINIRole 1 MG tablet Commonly known as: REQUIP Take 1 mg by mouth 3 (three) times daily.   simvastatin 40 MG tablet Commonly known as: ZOCOR TAKE 1 TABLET BY MOUTH EVERY NIGHT AT BEDTIME   vitamin B-12 500 MCG tablet Commonly known as: CYANOCOBALAMIN Take 500 mcg by mouth every morning.   Vitamin D3 25 MCG (1000 UT) Caps Take 1,000 Units by mouth daily.       Allergies: No Known Allergies  Family History: Family History  Problem Relation Age of Onset  . COPD Mother     Social History:  reports that he has never smoked. He has never used smokeless tobacco. He reports current alcohol use. He reports that he  does not use drugs.   Physical Exam: BP (!) 169/71   Pulse (!) 57   Ht 5\' 9"  (1.753 m)   Wt 160 lb (72.6 kg)   BMI 23.63 kg/m   Constitutional:  Alert and oriented, No acute distress.  Appears younger than stated age, relatively spry.  Accompanied by daughter. HEENT: [place] AT, moist mucus membranes.  Trachea midline, no masses. Cardiovascular: No clubbing, cyanosis, or edema. Respiratory: Normal respiratory effort, no increased work of breathing. Rectal: Poor sphincter tone, prostate, rock hard/fixed nodules/nontender consistent with prostate cancer Skin: No rashes, bruises or suspicious lesions. Neurologic: Grossly intact, no focal deficits, moving all 4 extremities. Psychiatric: Normal mood and affect.   Pertinent Imaging: Results for orders placed or performed in visit on 04/29/20  BLADDER SCAN AMB NON-IMAGING  Result Value Ref Range   Scan Result >571     Assessment & Plan:    1. Prostate cancer Clinical dx based on exam and history of elevated PSA. Goal is to pursue palliative care and symptom management control rather than for curative intent at his age We had lengthy discussion today about ADT only based on his age, side effects of ADT including hot flashes, changes in libido and energy amongst others. We also discussed the natural history of prostate cancer at length including development of metastatic disease including to the bone, pathologic fracture, renal failure from urinary obstruction amongst others. Goals of care were discussed Patient is unsure if he wants to undergo treatment at this time Most recent PSA was 79.81 on 09/25/2019 Will check PSA today to help assess how severe his diseases  2. BPH with incomplete bladder emptying /chronic urinary retention Failed on Flomax and is now taking finasteride. PVR is >571.  DRE today showed a rock hard and fixed nodule consistent with prostate cancer He is not interested in self cath, as fever chronic indwelling  Foley Would likely improve if his prostate cancer is managed as above, declined treatment at this time Will continue surveillance Most recent renal function in March 2021 was preserved, no infection or any other sequela of chronic retention  3. Erectile dysfunction Patient is not a good candidate for PD5 inhibitors because of Imdur  We discussed the pathophysiology of erectile dysfunction today along with possible congestive any factors. Discussed possible treatment options including PDE 5 inhibitors, vacuum erectile device, intracavernosal injection, MUSE, and placement of the inflatable or malleable penal prosthesis for refractory cases.  He is interested in intracavernosal injection.  He will return for injection teaching in the  near future.   [place] [address] [place], [place] [address] [phone]  I, Selena Batten, am acting as a scribe for Dr. Hollice Espy.  I have reviewed the above documentation for accuracy and completeness, and I agree with the above.   Hollice Espy, MD  I spent 60 total minutes on the day of the encounter including pre-visit review of the medical record, face-to-face time with the patient, and post visit ordering of labs/imaging/tests.

## 2020-04-29 ENCOUNTER — Ambulatory Visit: Payer: Medicare HMO | Admitting: Urology

## 2020-04-29 ENCOUNTER — Other Ambulatory Visit: Payer: Self-pay

## 2020-04-29 VITALS — BP 169/71 | HR 57 | Ht 69.0 in | Wt 168.0 lb

## 2020-04-29 DIAGNOSIS — N400 Enlarged prostate without lower urinary tract symptoms: Secondary | ICD-10-CM | POA: Diagnosis not present

## 2020-04-29 LAB — BLADDER SCAN AMB NON-IMAGING: Scan Result: >571

## 2020-04-29 MED ORDER — AMBULATORY NON FORMULARY MEDICATION: 0 refills | Status: DC

## 2020-04-30 LAB — URINALYSIS, COMPLETE
Bilirubin, UA: NEGATIVE
Glucose, UA: NEGATIVE
Ketones, UA: NEGATIVE
Leukocytes,UA: NEGATIVE
Nitrite, UA: NEGATIVE
Protein,UA: NEGATIVE
RBC, UA: NEGATIVE
Specific Gravity, UA: 1.02 (ref 1.005–1.030)
Urobilinogen, Ur: 0.2 mg/dL (ref 0.2–1.0)
pH, UA: 5.5 (ref 5.0–7.5)

## 2020-04-30 LAB — PSA: Prostate Specific Ag, Serum: 2355 ng/mL — ABNORMAL HIGH (ref 0.0–4.0)

## 2020-04-30 LAB — MICROSCOPIC EXAMINATION
Bacteria, UA: NONE SEEN
Epithelial Cells (non renal): NONE SEEN /hpf (ref 0–10)

## 2020-05-07 ENCOUNTER — Telehealth: Payer: Self-pay | Admitting: Urology

## 2020-05-07 DIAGNOSIS — C61 Malignant neoplasm of prostate: Secondary | ICD-10-CM

## 2020-05-07 NOTE — Telephone Encounter (Signed)
I have been trying to contact this patient for the past week and was finally able to reach him today by telephone.  He had been out of town to [place].  We discussed today by phone that his PSA is markedly elevated, greater than 2000.  This is highly indicative of advanced metastatic disease.  We discussed the possible complications of advanced metastatic disease including high risk of mortality, renal failure, urinary retention which he is already experiencing, cord compression and pathologic fractures amongst others.  He still hesitant want to undergo treatment.  We discussed the option of staging with CT scan and bone scan to help him in his decision-making process.  He is agreeable this plan.  If he elects not to pursue treatment, would strongly recommend hospice/palliative care.  He will return to the office once he has the scans with his daughter-in-law.  Hollice Espy, MD

## 2020-05-08 NOTE — Telephone Encounter (Signed)
Called and lm to cb to schedule

## 2020-05-15 DIAGNOSIS — H01003 Unspecified blepharitis right eye, unspecified eyelid: Secondary | ICD-10-CM | POA: Diagnosis not present

## 2020-05-19 ENCOUNTER — Telehealth: Payer: Self-pay

## 2020-05-19 NOTE — Telephone Encounter (Signed)
Incoming voicemail on triage line from patient's daughter in law stating that pt is interested in self cathing and requests lab results. Unable to call daughter in law back as she is not listed on DPR. Called pt he states that he is interested in self cathing to help alleviate nocturia. Advised pt that he can discuss this at his upcoming appt in a week, pt gave verbal understanding. Declines sooner appt.

## 2020-05-20 ENCOUNTER — Ambulatory Visit: Admission: RE | Admit: 2020-05-20 | Payer: Medicare HMO | Source: Ambulatory Visit

## 2020-05-20 ENCOUNTER — Ambulatory Visit: Payer: Medicare HMO | Attending: Urology

## 2020-05-26 NOTE — Progress Notes (Signed)
05/27/2020 4:59 PM   Jason Murphy 1919-12-18 [id number]  Referring provider: Lynnell Jude, MD [address] [place],  [place] [address]  Chief Complaint  Patient presents with  . Follow-up    HPI: Jason Murphy is a 86 y.o. male with clinical prostate cancer, BPH with retention and ED who presents today with his step-daughter, [name] for an ED titration.  When I entered the room and started to ask questions regarding his ED, he and his step-daughter stated that they were not here for that issue.  She wanted to know if there was anything we could do regarding his urinary symptoms and there was a phone call regarding some test results and wanted more information.  He has been experiencing frequency, urgency, difficulty with urinating, nocturia and weak urinary stream.  I explained to him and [name] that with Dr. Erlene Quan saw him on July 15 he was found to have a PVR greater than 571 on her bladder scanner.  At that appointment, an indwelling Foley and self cathing were discussed but he deferred both options.  [name] and Jason Murphy would like to be instructed in CIC at this time.     The tests where in response to the recent PSA level of 2,355.0 on 04/29/2020.  I read to [name] the telephone note dated May 07, 2020 that was a conversation between Dr. Erlene Quan and Jason Murphy.  I stated that the CT scan and bone scan were ordered and were scheduled for August 4, but Jason Murphy stated he had no idea he had these appointments.  Both Jason Murphy and his stepdaughter are interested in pursuing these imaging studies.  I have also had Jason Murphy fill out a DPR so that we are allowed to speak with his stepdaughter [name] in the future.   PMH: Past Medical History:  Diagnosis Date  . Cancer [facility])    prostate  . Coronary artery disease   . Dizziness    chronic dizziness  . Dyspnea   . Fatigue    Progressive fatigue with question of sleep apnea  . Hypercholesterolemia   . Hypertension   .  Obstructive sleep apnea   . Pneumonia   . Restless leg syndrome     Surgical History: Past Surgical History:  Procedure Laterality Date  . APPENDECTOMY    . CARDIAC CATHETERIZATION     Ejection Fraction was 60%, stent  . KNEE SURGERY     arthroscopic left knee surgery  . SHOULDER ARTHROSCOPY      Home Medications:  Allergies as of 05/27/2020   No Known Allergies     Medication List       Accurate as of May 27, 2020  4:59 PM. If you have any questions, ask your nurse or doctor.        AMBULATORY NON FORMULARY MEDICATION Trimix (30/1/10)-(Pap/Phent/PGE)  Test Dose  91ml vial   Qty #3 Refills 0  [facility] [phone] Fax [phone]   aspirin EC 81 MG tablet Take 81 mg by mouth at bedtime.   co-enzyme Q-10 50 MG capsule Take 100 mg by mouth every morning.   finasteride 5 MG tablet Commonly known as: PROSCAR Take by mouth.   isosorbide mononitrate 30 MG 24 hr tablet Commonly known as: IMDUR   lansoprazole 15 MG capsule Commonly known as: PREVACID Take 15-30 mg by mouth See admin instructions. Take 1 capsule by mouth every other day alternating with 2 capsules (30mg ) by mouth every other day.  levothyroxine 50 MCG tablet Commonly known as: SYNTHROID Take 50 mcg by mouth daily before breakfast.   meclizine 25 MG tablet Commonly known as: ANTIVERT Take 25 mg by mouth 2 (two) times daily as needed for dizziness.   nitroGLYCERIN 0.4 MG SL tablet Commonly known as: NITROSTAT Place 0.4 mg under the tongue every 5 (five) minutes as needed for chest pain.   predniSONE 10 MG tablet Commonly known as: DELTASONE 50 mg daily x 2 days, then 40 mg daily x 2 days, then 30 mg daily x 2 days, then 20 mg daily x 2 days, then 10 mg daily x 2 days.   PRESERVISION AREDS 2 PO Take 1 tablet by mouth daily.   rOPINIRole 1 MG tablet Commonly known as: REQUIP Take 1 mg by mouth 3 (three) times daily.   sildenafil 25 MG tablet Commonly known as:  VIAGRA Take by mouth.   simvastatin 40 MG tablet Commonly known as: ZOCOR TAKE 1 TABLET BY MOUTH EVERY NIGHT AT BEDTIME   vitamin B-12 500 MCG tablet Commonly known as: CYANOCOBALAMIN Take 500 mcg by mouth every morning.   Vitamin D3 25 MCG (1000 UT) Caps Take 1,000 Units by mouth daily.       Allergies: No Known Allergies  Family History: Family History  Problem Relation Age of Onset  . COPD Mother     Social History:  reports that he has never smoked. He has never used smokeless tobacco. He reports current alcohol use. He reports that he does not use drugs.  ROS: Pertinent ROS in HPI  Physical Exam: BP 134/74   Pulse (!) 56   Temp 98 F (36.7 C)   Ht 5\' 9"  (1.753 m)   Wt 168 lb (76.2 kg)   SpO2 97%   BMI 24.81 kg/m   Constitutional:  Well nourished. Alert and oriented, No acute distress. HEENT: [place] AT, mask in place.  Trachea midline Cardiovascular: No clubbing, cyanosis, or edema. Respiratory: Normal respiratory effort, no increased work of breathing. Neurologic: Grossly intact, no focal deficits, moving all 4 extremities. Psychiatric: Normal mood and affect.  Laboratory Data: Contains abnormal dataPSA, Total (Screen) Specimen:  Blood  Ref Range & Units 1 yr ago  PSA (Prostate Specific Antigen), Total 0.10 - 4.00 ng/mL 79.81High    Component     Latest Ref Rng & Units 04/29/2020  Prostate Specific Ag, Serum     0.0 - 4.0 ng/mL 2,355.0 (H)   Lab Results  Component Value Date   WBC 6.6 11/13/2017   HGB 14.1 11/13/2017   HCT 40.9 11/13/2017   MCV 93 11/13/2017   PLT 259 11/13/2017    Lab Results  Component Value Date   CREATININE 0.98 11/13/2017    Lab Results  Component Value Date   TSH 5.210 (H) 11/13/2017       Component Value Date/Time   CHOL 124 11/13/2017 1420   HDL 38 (L) 11/13/2017 1420   LDLCALC 53 11/13/2017 1420    Lab Results  Component Value Date   AST 44 (H) 11/13/2017   Lab Results  Component Value Date   ALT  36 11/13/2017    Urinalysis    Component Value Date/Time   COLORURINE AMBER (A) 01/03/2016 1513   APPEARANCEUR [place] (A) 04/29/2020 1025   LABSPEC 1.028 01/03/2016 1513   LABSPEC 1.015 01/02/2012 2312   PHURINE 5.0 01/03/2016 1513   GLUCOSEU Negative 04/29/2020 1025   GLUCOSEU Negative 01/02/2012 2312   HGBUR NEGATIVE 01/03/2016 1513  BILIRUBINUR Negative 04/29/2020 1025   BILIRUBINUR Negative 01/02/2012 [place] 01/03/2016 1513   PROTEINUR Negative 04/29/2020 1025   PROTEINUR 100 (A) 01/03/2016 1513   NITRITE Negative 04/29/2020 1025   NITRITE NEGATIVE 01/03/2016 1513   LEUKOCYTESUR Negative 04/29/2020 1025   LEUKOCYTESUR Negative 01/02/2012 2312  I have reviewed the labs.   Pertinent Imaging: No recent imaging     Assessment & Plan:   1. Prostate cancer Patient's step-daughter and patient would like to pursue the staging CT scan and bone scan at this time and will get them rescheduled  They will return for an appointment with Dr. Erlene Quan to review results  2. Urinary retention Patient will return for CIC teaching  3. ED I explained that the medication is available through a compounding pharmacy He would like to table the issue ED at this time and possibly address it again after he has decided on a plan for his prostate cancer  Return for CT and bone scan results with Dr. Erlene Quan .  These notes generated with voice recognition software. I apologize for typographical errors.  Zara Council, PA-C  [place] [address]  [place], [place] [address] [phone]  I spent 30 minutes on the day of the encounter to include pre-visit record review, face-to-face time with the patient, and post-visit ordering of tests.

## 2020-05-27 ENCOUNTER — Other Ambulatory Visit: Payer: Self-pay

## 2020-05-27 ENCOUNTER — Encounter: Payer: Self-pay | Admitting: Urology

## 2020-05-27 ENCOUNTER — Ambulatory Visit (INDEPENDENT_AMBULATORY_CARE_PROVIDER_SITE_OTHER): Payer: Medicare HMO | Admitting: Urology

## 2020-05-27 VITALS — BP 134/74 | HR 56 | Temp 98.0°F | Ht 69.0 in | Wt 168.0 lb

## 2020-05-27 DIAGNOSIS — R338 Other retention of urine: Secondary | ICD-10-CM

## 2020-05-27 DIAGNOSIS — N529 Male erectile dysfunction, unspecified: Secondary | ICD-10-CM

## 2020-05-27 DIAGNOSIS — C61 Malignant neoplasm of prostate: Secondary | ICD-10-CM | POA: Diagnosis not present

## 2020-05-27 DIAGNOSIS — N401 Enlarged prostate with lower urinary tract symptoms: Secondary | ICD-10-CM

## 2020-05-28 DIAGNOSIS — I480 Paroxysmal atrial fibrillation: Secondary | ICD-10-CM | POA: Diagnosis not present

## 2020-05-28 DIAGNOSIS — I2510 Atherosclerotic heart disease of native coronary artery without angina pectoris: Secondary | ICD-10-CM | POA: Diagnosis not present

## 2020-05-28 DIAGNOSIS — E782 Mixed hyperlipidemia: Secondary | ICD-10-CM | POA: Diagnosis not present

## 2020-06-01 NOTE — Progress Notes (Signed)
Continuous Intermittent Catheterization Due to retention patient is present today for a teaching of self I & O Catheterization with his step-daughter, [name].  Patient was given detailed verbal and printed instructions of self catheterization. Patient was cleaned and prepped in a sterile fashion.  With instruction and assistance patient inserted a 16 FR coude and urine return was noted 620 ml, urine was dark yellow in color. Patient tolerated well, no complications were noted Patient was given a sample bag with supplies to take home.  Instructions were given per me for patient to cath BID times daily.  An order was placed with [facility] for catheters to be sent to the patient's home. Patient is to follow up on 06/16/2020 with Dr. Erlene Quan to review CT and Bone scan results.    Performed by: Zara Council, PA-C  I spent 15 minutes on the day of the encounter to include pre-visit record review, face-to-face time with the patient, and post-visit ordering of tests.

## 2020-06-02 ENCOUNTER — Encounter: Payer: Self-pay | Admitting: Urology

## 2020-06-02 ENCOUNTER — Other Ambulatory Visit: Payer: Self-pay

## 2020-06-02 ENCOUNTER — Ambulatory Visit: Payer: Medicare HMO | Admitting: Urology

## 2020-06-02 VITALS — BP 149/71 | HR 76 | Ht 69.0 in | Wt 169.5 lb

## 2020-06-02 DIAGNOSIS — C61 Malignant neoplasm of prostate: Secondary | ICD-10-CM

## 2020-06-02 DIAGNOSIS — R339 Retention of urine, unspecified: Secondary | ICD-10-CM

## 2020-06-03 ENCOUNTER — Ambulatory Visit
Admission: RE | Admit: 2020-06-03 | Discharge: 2020-06-03 | Disposition: A | Discharge status: Home / Self Care | Payer: Medicare HMO | Source: Ambulatory Visit | Attending: Urology | Admitting: Urology

## 2020-06-03 DIAGNOSIS — R339 Retention of urine, unspecified: Secondary | ICD-10-CM | POA: Diagnosis not present

## 2020-06-03 DIAGNOSIS — I714 Abdominal aortic aneurysm, without rupture: Secondary | ICD-10-CM | POA: Diagnosis not present

## 2020-06-03 DIAGNOSIS — C61 Malignant neoplasm of prostate: Secondary | ICD-10-CM | POA: Diagnosis not present

## 2020-06-03 DIAGNOSIS — N200 Calculus of kidney: Secondary | ICD-10-CM | POA: Diagnosis not present

## 2020-06-03 DIAGNOSIS — K5730 Diverticulosis of large intestine without perforation or abscess without bleeding: Secondary | ICD-10-CM | POA: Diagnosis not present

## 2020-06-03 DIAGNOSIS — C7951 Secondary malignant neoplasm of bone: Secondary | ICD-10-CM | POA: Diagnosis not present

## 2020-06-03 LAB — POCT I-STAT CREATININE: Creatinine, Ser: 1 mg/dL (ref 0.61–1.24)

## 2020-06-03 MED ORDER — IOHEXOL 300 MG/ML  SOLN
100.0000 mL | Freq: Once | INTRAMUSCULAR | Status: AC | PRN
  Administered 2020-06-03: 100 mL via INTRAVENOUS

## 2020-06-03 MED ORDER — TECHNETIUM TC 99M MEDRONATE IV KIT
20.0000 | PACK | Freq: Once | INTRAVENOUS | Status: AC | PRN
  Administered 2020-06-03: 24.307 via INTRAVENOUS

## 2020-06-09 ENCOUNTER — Telehealth: Payer: Self-pay

## 2020-06-09 ENCOUNTER — Ambulatory Visit: Payer: Self-pay | Admitting: Urology

## 2020-06-09 ENCOUNTER — Encounter: Payer: Self-pay | Admitting: Urology

## 2020-06-09 NOTE — Telephone Encounter (Signed)
Patient NS for appointment today, pt was unaware we had moved his appointment up. Spoke with patients daughter. She advised me to call him to schedule an appointment. Left message on patients phone. Dr. Erlene Quan wants to see patient ASAP.

## 2020-06-09 NOTE — Telephone Encounter (Signed)
Please continue to reach out to him and get him into my clinic ASAP.    Hollice Espy, MD

## 2020-06-10 NOTE — Telephone Encounter (Signed)
Spoke with patient , he is scheduled for Wednesday 06/17/2020.

## 2020-06-11 ENCOUNTER — Telehealth: Payer: Self-pay | Admitting: Urology

## 2020-06-11 ENCOUNTER — Telehealth: Payer: Self-pay | Admitting: Family Medicine

## 2020-06-11 NOTE — Telephone Encounter (Signed)
Spoke with daughter, Jason Murphy, and Jason Murphy regarding the gross hematuria.  Jason Murphy states the blood has been in the urine off and on all week.  Jason Murphy states it started this morning.  He could not be more specific about the color of the urine when he cathed, but he states he is still able to cath himself.  I offered for him to come in and we can just place a Foley, but he refused.  I then spoke with Jason Murphy and she will check on him tonight.  She would like an appointment either tomorrow or Monday for him.

## 2020-06-11 NOTE — Telephone Encounter (Signed)
Patient caths 2-3x daily indefinitely due to retention and requires a coude due to inability to pass a straight catheter."

## 2020-06-11 NOTE — Telephone Encounter (Signed)
I also advised him to stop his ASA for the next few days and see if the urine clears.

## 2020-06-12 ENCOUNTER — Encounter: Payer: Self-pay | Admitting: Urology

## 2020-06-12 ENCOUNTER — Other Ambulatory Visit: Payer: Self-pay

## 2020-06-12 ENCOUNTER — Ambulatory Visit (INDEPENDENT_AMBULATORY_CARE_PROVIDER_SITE_OTHER): Payer: Medicare HMO | Admitting: Urology

## 2020-06-12 VITALS — BP 150/60 | HR 66 | Ht 69.0 in | Wt 169.0 lb

## 2020-06-12 DIAGNOSIS — R339 Retention of urine, unspecified: Secondary | ICD-10-CM

## 2020-06-12 DIAGNOSIS — C61 Malignant neoplasm of prostate: Secondary | ICD-10-CM | POA: Diagnosis not present

## 2020-06-12 DIAGNOSIS — R310 Gross hematuria: Secondary | ICD-10-CM | POA: Diagnosis not present

## 2020-06-12 LAB — URINALYSIS, COMPLETE
Bilirubin, UA: NEGATIVE
Glucose, UA: NEGATIVE
Ketones, UA: NEGATIVE
Nitrite, UA: NEGATIVE
Specific Gravity, UA: >1.03 — ABNORMAL HIGH (ref 1.005–1.030)
Urobilinogen, Ur: 1 mg/dL (ref 0.2–1.0)
pH, UA: 5.5 (ref 5.0–7.5)

## 2020-06-12 LAB — MICROSCOPIC EXAMINATION
RBC: >30 /hpf — AB (ref 0–2)
WBC, UA: >30 /hpf — AB (ref 0–5)

## 2020-06-12 NOTE — Progress Notes (Signed)
Cath Change/ Replacement  Patient was cleaned and prepped in a sterile fashion with betadine. A 16coude foley cath was replaced into the bladder no complications were noted Urine return was noted 66ml and urine was red in color. The balloon was filled with 33ml of sterile water. A leg bag was attached for drainage. Patient was given proper instruction on catheter care.    Performed by: Elberta Leatherwood, CMA  Follow up: Wednesday with Dr. Erlene Quan

## 2020-06-13 ENCOUNTER — Other Ambulatory Visit: Payer: Self-pay

## 2020-06-13 ENCOUNTER — Encounter: Payer: Self-pay | Admitting: Emergency Medicine

## 2020-06-13 ENCOUNTER — Emergency Department
Admission: EM | Admit: 2020-06-13 | Discharge: 2020-06-13 | Disposition: A | Discharge status: Home / Self Care | Payer: Medicare HMO | Attending: Emergency Medicine | Admitting: Emergency Medicine

## 2020-06-13 ENCOUNTER — Encounter: Payer: Self-pay | Admitting: Urology

## 2020-06-13 DIAGNOSIS — I2510 Atherosclerotic heart disease of native coronary artery without angina pectoris: Secondary | ICD-10-CM | POA: Insufficient documentation

## 2020-06-13 DIAGNOSIS — Z7982 Long term (current) use of aspirin: Secondary | ICD-10-CM | POA: Insufficient documentation

## 2020-06-13 DIAGNOSIS — C61 Malignant neoplasm of prostate: Secondary | ICD-10-CM

## 2020-06-13 DIAGNOSIS — R339 Retention of urine, unspecified: Secondary | ICD-10-CM | POA: Insufficient documentation

## 2020-06-13 DIAGNOSIS — I10 Essential (primary) hypertension: Secondary | ICD-10-CM | POA: Diagnosis not present

## 2020-06-13 DIAGNOSIS — Z79899 Other long term (current) drug therapy: Secondary | ICD-10-CM | POA: Insufficient documentation

## 2020-06-13 LAB — BASIC METABOLIC PANEL
Anion gap: 14 (ref 5–15)
BUN: 18 mg/dL (ref 8–23)
CO2: 20 mmol/L — ABNORMAL LOW (ref 22–32)
Calcium: 8.5 mg/dL — ABNORMAL LOW (ref 8.9–10.3)
Chloride: 104 mmol/L (ref 98–111)
Creatinine, Ser: 1.1 mg/dL (ref 0.61–1.24)
GFR calc Af Amer: >60 mL/min (ref >60)
GFR calc non Af Amer: 56 mL/min — ABNORMAL LOW (ref >60)
Glucose, Bld: 155 mg/dL — ABNORMAL HIGH (ref 70–99)
Potassium: 3.5 mmol/L (ref 3.5–5.1)
Sodium: 138 mmol/L (ref 135–145)

## 2020-06-13 LAB — URINALYSIS, COMPLETE (UACMP) WITH MICROSCOPIC
Bilirubin Urine: NEGATIVE
Glucose, UA: NEGATIVE mg/dL
Ketones, ur: NEGATIVE mg/dL
Nitrite: NEGATIVE
Protein, ur: 30 mg/dL — AB
RBC / HPF: >50 RBC/hpf — ABNORMAL HIGH (ref 0–5)
Specific Gravity, Urine: 1.014 (ref 1.005–1.030)
WBC, UA: >50 WBC/hpf — ABNORMAL HIGH (ref 0–5)
pH: 5 (ref 5.0–8.0)

## 2020-06-13 LAB — CBC
HCT: 37.5 % — ABNORMAL LOW (ref 39.0–52.0)
Hemoglobin: 12.4 g/dL — ABNORMAL LOW (ref 13.0–17.0)
MCH: 31.5 pg (ref 26.0–34.0)
MCHC: 33.1 g/dL (ref 30.0–36.0)
MCV: 95.2 fL (ref 80.0–100.0)
Platelets: 303 10*3/uL (ref 150–400)
RBC: 3.94 MIL/uL — ABNORMAL LOW (ref 4.22–5.81)
RDW: 13.2 % (ref 11.5–15.5)
WBC: 8.7 10*3/uL (ref 4.0–10.5)
nRBC: 0 % (ref 0.0–0.2)

## 2020-06-13 MED ORDER — LIDOCAINE HCL URETHRAL/MUCOSAL 2 % EX GEL
1.0000 "application " | Freq: Once | CUTANEOUS | Status: AC | PRN
  Administered 2020-06-13: 1 via URETHRAL

## 2020-06-13 MED ORDER — OXYCODONE-ACETAMINOPHEN 5-325 MG PO TABS
1.0000 | ORAL_TABLET | Freq: Once | ORAL | Status: AC
  Administered 2020-06-13: 1 via ORAL
  Filled 2020-06-13: qty 1

## 2020-06-13 MED ORDER — OXYCODONE-ACETAMINOPHEN 5-325 MG PO TABS: 1.0000 | ORAL_TABLET | Freq: Four times a day (QID) | ORAL | 0 refills | Status: DC | PRN

## 2020-06-13 NOTE — ED Notes (Addendum)
This RN attempted foley cath on pt per order, without success. Pt with bloody drainage around penis. 16 Fr Coude cath inserted by this RN. Good urine return, urine is blood tinged. Pt tolerated well. Pt given instruction about how to care for foley catheter. Pt left with large drainage bad intact for ride home, pt given leg bag to take home. Pt reminded to not leave leg bag on at night, but to use large drainage bag, kept lower than waist at night. Daughter given foley instructions as well.

## 2020-06-13 NOTE — Progress Notes (Signed)
06/12/2020 10:17 AM   Jason Murphy [birth year] [id number]  Referring provider: Baxter Hire, MD [place],  [place] [address]  Chief Complaint  Patient presents with  . Hematuria    HPI: 86 y.o. male recently seen by Dr. Erlene Quan with a clinical diagnosis of prostate cancer.   Hard, nodular prostate  PSA >2000  He was reluctant to pursue treatment due to excellent quality of life  CT and bone scan were ordered and he has a follow-up with Dr. Erlene Quan 06/17/2020  Incomplete bladder emptying with PVR >571 with symptoms of frequency, urgency, hesitancy, nocturia and weak urinary stream  Had been on tamsulosin but stopped secondary to orthostatic symptoms  Saw Larene Beach 05/27/2020 and elected to start CIC and return for teaching 8/17  Daughter contacted office on 8/26 stating intermittent hematuria with catheterization  He opened a catheter 2 days ago and it dried out and he tried to use the next day and had significant discomfort  Complains of continued pain and discomfort with catheterization  He was able to catheterize on arrival to office and urine was blood-tinged   PMH: Past Medical History:  Diagnosis Date  . Cancer [facility])    prostate  . Coronary artery disease   . Dizziness    chronic dizziness  . Dyspnea   . Fatigue    Progressive fatigue with question of sleep apnea  . Hypercholesterolemia   . Hypertension   . Obstructive sleep apnea   . Pneumonia   . Restless leg syndrome     Surgical History: Past Surgical History:  Procedure Laterality Date  . APPENDECTOMY    . CARDIAC CATHETERIZATION     Ejection Fraction was 60%, stent  . KNEE SURGERY     arthroscopic left knee surgery  . SHOULDER ARTHROSCOPY      Home Medications:  Allergies as of 06/12/2020   No Known Allergies     Medication List       Accurate as of June 12, 2020 11:59 PM. If you have any questions, ask your nurse or doctor.        AMBULATORY NON  FORMULARY MEDICATION Trimix (30/1/10)-(Pap/Phent/PGE)  Test Dose  31ml vial   Qty #3 Refills 0  [facility] [phone] Fax [phone]   aspirin EC 81 MG tablet Take 81 mg by mouth at bedtime.   co-enzyme Q-10 50 MG capsule Take 100 mg by mouth every morning.   finasteride 5 MG tablet Commonly known as: PROSCAR Take by mouth.   isosorbide mononitrate 30 MG 24 hr tablet Commonly known as: IMDUR   lansoprazole 15 MG capsule Commonly known as: PREVACID Take 15-30 mg by mouth See admin instructions. Take 1 capsule by mouth every other day alternating with 2 capsules (30mg ) by mouth every other day.   levothyroxine 50 MCG tablet Commonly known as: SYNTHROID Take 50 mcg by mouth daily before breakfast.   meclizine 25 MG tablet Commonly known as: ANTIVERT Take 25 mg by mouth 2 (two) times daily as needed for dizziness.   nitroGLYCERIN 0.4 MG SL tablet Commonly known as: NITROSTAT Place 0.4 mg under the tongue every 5 (five) minutes as needed for chest pain.   predniSONE 10 MG tablet Commonly known as: DELTASONE 50 mg daily x 2 days, then 40 mg daily x 2 days, then 30 mg daily x 2 days, then 20 mg daily x 2 days, then 10 mg daily x 2 days.   PRESERVISION AREDS 2 PO Take 1  tablet by mouth daily.   rOPINIRole 1 MG tablet Commonly known as: REQUIP Take 1 mg by mouth 3 (three) times daily.   sildenafil 25 MG tablet Commonly known as: VIAGRA Take by mouth.   simvastatin 40 MG tablet Commonly known as: ZOCOR TAKE 1 TABLET BY MOUTH EVERY NIGHT AT BEDTIME   vitamin B-12 500 MCG tablet Commonly known as: CYANOCOBALAMIN Take 500 mcg by mouth every morning.   Vitamin D3 25 MCG (1000 UT) Caps Take 1,000 Units by mouth daily.       Allergies: No Known Allergies  Family History: Family History  Problem Relation Age of Onset  . COPD Mother     Social History:  reports that he has never smoked. He has never used smokeless tobacco. He reports current  alcohol use. He reports that he does not use drugs.   Physical Exam: BP (!) 150/60   Pulse 66   Ht 5\' 9"  (1.753 m)   Wt 169 lb (76.7 kg)   BMI 24.96 kg/m   Constitutional:  Alert and oriented, No acute distress. HEENT: [place] AT, moist mucus membranes.  Trachea midline, no masses. Cardiovascular: No clubbing, cyanosis, or edema. Respiratory: Normal respiratory effort, no increased work of breathing.  Assessment & Plan:    1. Gross hematuria  New problem and most likely a combination of prostate cancer, BPH and intermittent catheterization  2. Urinary retention  Recommended placement of an indwelling Foley until at least his follow-up with Dr. Erlene Quan on 06/17/2020. He was reluctant at first but then agreed  3 wound care. Prostate cancer  CT and bone scan consistent with metastatic disease  He has an appointment with Dr. Erlene Quan next week to discuss results and treatment   Abbie Sons, [place] [address], [place], [place] [address] [phone]

## 2020-06-13 NOTE — ED Provider Notes (Signed)
[facility] Emergency Department Provider Note  ____________________________________________  Time seen: Approximately 7:47 PM  I have reviewed the triage vital signs and the nursing notes.   HISTORY  Chief Complaint Urinary Retention (hx of prostate CA) and Replace Foley Catheter    HPI Jason Murphy is a 86 y.o. male who presents emergency department complaining of urinary retention.  Patient has a history of prostate cancer, is currently under treatment by urology.  They are still undergoing work-up to determine best course of action.  Patient has had worsening urinary retention and recently had an episode while trying to self cath that macerated the tissue.  Patient had an indwelling catheter placed yesterday but the pain was so severe that the patient removed the catheter at home.   Patient has been having hematuria.  Patient is unable to take Flomax due to side effects.  Medical history as described below.  Other than prostate cancer, no complaints of chronic medical problems.        Past Medical History:  Diagnosis Date   Cancer [facility])    prostate   Coronary artery disease    Dizziness    chronic dizziness   Dyspnea    Fatigue    Progressive fatigue with question of sleep apnea   Hypercholesterolemia    Hypertension    Obstructive sleep apnea    Pneumonia    Restless leg syndrome     Patient Active Problem List   Diagnosis Date Noted   Hypertension    Coronary artery disease    RLS (restless legs syndrome) 09/21/2017   Paroxysmal A-fib ([place]) 01/08/2016   Mixed hyperlipidemia 01/08/2016   Hyponatremia 01/03/2016   Anxiety state 02/03/2015   Stress at home 02/03/2015   OBSTRUCTIVE SLEEP APNEA 10/22/2010   PROSTATE CANCER 10/21/2010   HYPERCHOLESTEROLEMIA 10/21/2010   HYPERTENSION 10/21/2010   CORONARY ARTERY DISEASE 10/21/2010   DIZZINESS, CHRONIC 10/21/2010    Past Surgical History:  Procedure Laterality  Date   APPENDECTOMY     CARDIAC CATHETERIZATION     Ejection Fraction was 60%, stent   KNEE SURGERY     arthroscopic left knee surgery   SHOULDER ARTHROSCOPY      Prior to Admission medications   Medication Sig Start Date End Date Taking? Authorizing Provider  AMBULATORY NON FORMULARY MEDICATION Trimix (30/1/10)-(Pap/Phent/PGE)  Test Dose  37ml vial   Qty #3 [place] [phone] Fax [phone] 04/29/20   Hollice Espy, MD  aspirin EC 81 MG tablet Take 81 mg by mouth at bedtime.    [provider]  Cholecalciferol (VITAMIN D3) 1000 units CAPS Take 1,000 Units by mouth daily.    [provider]  co-enzyme Q-10 50 MG capsule Take 100 mg by mouth every morning.     [provider]  cyanocobalamin 500 MCG tablet Take 500 mcg by mouth every morning.    [provider]  finasteride (PROSCAR) 5 MG tablet Take by mouth. 04/17/20 04/17/21  [provider]  isosorbide mononitrate (IMDUR) 30 MG 24 hr tablet  04/09/20   [provider]  lansoprazole (PREVACID) 15 MG capsule Take 15-30 mg by mouth See admin instructions. Take 1 capsule by mouth every other day alternating with 2 capsules (30mg ) by mouth every other day.    [provider]  levothyroxine (SYNTHROID, LEVOTHROID) 50 MCG tablet Take 50 mcg by mouth daily before breakfast.    [provider]  meclizine (ANTIVERT) 25 MG tablet Take 25  mg by mouth 2 (two) times daily as needed for dizziness.    [provider]  Multiple Vitamins-Minerals (PRESERVISION AREDS 2 PO) Take 1 tablet by mouth daily.    [provider]  nitroGLYCERIN (NITROSTAT) 0.4 MG SL tablet Place 0.4 mg under the tongue every 5 (five) minutes as needed for chest pain.    [provider]  oxyCODONE-acetaminophen (PERCOCET/ROXICET) 5-325 MG tablet Take 1 tablet by mouth every 6 (six) hours as needed for severe pain. 06/13/20   Natayla Cadenhead, Charline Bills, PA-C   predniSONE (DELTASONE) 10 MG tablet 50 mg daily x 2 days, then 40 mg daily x 2 days, then 30 mg daily x 2 days, then 20 mg daily x 2 days, then 10 mg daily x 2 days. 03/15/20   Coral Spikes, DO  rOPINIRole (REQUIP) 1 MG tablet Take 1 mg by mouth 3 (three) times daily. 03/10/20   [provider]  sildenafil (VIAGRA) 25 MG tablet Take by mouth. 04/17/20 05/17/20  [provider]  simvastatin (ZOCOR) 40 MG tablet TAKE 1 TABLET BY MOUTH EVERY NIGHT AT BEDTIME 05/26/11   Martinique, Peter M, MD  pramipexole (MIRAPEX) 0.5 MG tablet Take 0.5 mg by mouth at bedtime. 09/18/17 03/15/20  [provider]    Allergies Patient has no known allergies.  Family History  Problem Relation Age of Onset   COPD Mother     Social History Social History   Tobacco Use   Smoking status: Never Smoker   Smokeless tobacco: Never Used  Scientific laboratory technician Use: Never used  Substance Use Topics   Alcohol use: Yes    Comment: 1/2 glass wine daily   Drug use: No     Review of Systems  Constitutional: No fever/chills Eyes: No visual changes. No discharge ENT: No upper respiratory complaints. Cardiovascular: no chest pain. Respiratory: no cough. No SOB. Gastrointestinal: No abdominal pain.  No nausea, no vomiting.  No diarrhea.  No constipation. Genitourinary: Patient with urinary retention.  History of prostate cancer.  Removed Foley at home Musculoskeletal: Negative for musculoskeletal pain. Skin: Negative for rash, abrasions, lacerations, ecchymosis. Neurological: Negative for headaches, focal weakness or numbness. 10-point ROS otherwise negative.  ____________________________________________   PHYSICAL EXAM:  VITAL SIGNS: ED Triage Vitals  Enc Vitals Group     BP 06/13/20 1722 (!) 108/54     Pulse Rate 06/13/20 1722 82     Resp 06/13/20 1722 18     Temp 06/13/20 1722 98 F (36.7 C)     Temp Source 06/13/20 1722 Oral     SpO2 06/13/20 1722 97 %     Weight 06/13/20 1723  169 lb (76.7 kg)     Height 06/13/20 1723 5\' 9"  (1.753 m)     Head Circumference --      Peak Flow --      Pain Score 06/13/20 1723 10     Pain Loc --      Pain Edu? --      Excl. in [place]? --      Constitutional: Alert and oriented. Well appearing and in no acute distress. Eyes: Conjunctivae are normal. PERRL. EOMI. Head: Atraumatic. ENT:      Ears:       Nose: No congestion/rhinnorhea.      Mouth/Throat: Mucous membranes are moist.  Neck: No stridor.    Cardiovascular: Normal rate, regular rhythm. Normal S1 and S2.  Good peripheral circulation. Respiratory: Normal respiratory effort without tachypnea or retractions. Lungs  CTAB. Good air entry to the bases with no decreased or absent breath sounds. Gastrointestinal: Bowel sounds 4 quadrants.  Soft to palpation all quadrants.  No tenderness.  No guarding or rigidity. No palpable masses. No distention. No CVA tenderness. Musculoskeletal: Full range of motion to all extremities. No gross deformities appreciated. Neurologic:  Normal speech and language. No gross focal neurologic deficits are appreciated.  Skin:  Skin is warm, dry and intact. No rash noted. Psychiatric: Mood and affect are normal. Speech and behavior are normal. Patient exhibits appropriate insight and judgement.   ____________________________________________   LABS (all labs ordered are listed, but only abnormal results are displayed)  Labs Reviewed  BASIC METABOLIC PANEL - Abnormal; Notable for the following components:      Result Value   CO2 20 (*)    Glucose, Bld 155 (*)    Calcium 8.5 (*)    GFR calc non Af Amer 56 (*)    All other components within normal limits  CBC - Abnormal; Notable for the following components:   RBC 3.94 (*)    Hemoglobin 12.4 (*)    HCT 37.5 (*)    All other components within normal limits  URINALYSIS, COMPLETE (UACMP) WITH MICROSCOPIC    ____________________________________________  EKG   ____________________________________________  RADIOLOGY   No results found.  ____________________________________________    PROCEDURES  Procedure(s) performed:    Procedures    Medications  lidocaine (XYLOCAINE) 2 % jelly 1 application (1 application Urethral Given 06/13/20 2026)  oxyCODONE-acetaminophen (PERCOCET/ROXICET) 5-325 MG per tablet 1 tablet (1 tablet Oral Given 06/13/20 1954)     ____________________________________________   INITIAL IMPRESSION / ASSESSMENT AND PLAN / ED COURSE  Pertinent labs & imaging results that were available during my care of the patient were reviewed by me and considered in my medical decision making (see chart for details).  Review of the [place] CSRS was performed in accordance of the [place] prior to dispensing any controlled drugs.           Patient's diagnosis is consistent with urinary retention secondary to prostate cancer.  Patient presented to emergency department with urinary retention.  Patient has known prostate cancer that is being evaluated by urology.  Patient had catheter placed yesterday, due to the pain he removed the catheter at home yesterday.  Patient is unable to take Flomax due to side effects.  At this time I will replace the urinary catheter and refer patient back to urology..  Patient is given ED precautions to return to the ED for any worsening or new symptoms.     ____________________________________________  FINAL CLINICAL IMPRESSION(S) / ED DIAGNOSES  Final diagnoses:  Urinary retention  Prostate cancer ([place])      NEW MEDICATIONS STARTED DURING THIS VISIT:  ED Discharge Orders         Ordered    oxyCODONE-acetaminophen (PERCOCET/ROXICET) 5-325 MG tablet  Every 6 hours PRN        06/13/20 2036              This chart was dictated using voice recognition software/Dragon. Despite best efforts to proofread, errors can occur which can  change the meaning. Any change was purely unintentional.    Brynda Peon 06/13/20 2036    Blake Divine, MD 06/13/20 2112

## 2020-06-13 NOTE — ED Notes (Signed)
Pt wheeled to lobby by daughter, this RN offered to wheel pt, daughter states she will wheel pt to lobby. Pt dc to lobby in wheelchair. Pt given leg bag for home use.

## 2020-06-13 NOTE — ED Notes (Signed)
Pt states that he in and out cath's himself for urine and that yesterday when he did it, he had a painful time taking it out and doesn't want to do it since then. Pt states he hasn't gotten urine out since 2 am this morning. NAD noted.

## 2020-06-13 NOTE — ED Triage Notes (Addendum)
Pt arrived via POV with family with reports of hx of prostate CA, has had chronic catheter use because he is unable to self-cath due to pain.  Per daughter, pt's catheter came out around 2am last night and had bleeding and has been unable to void since then.  Pt sees Zara Council at [facility].  Pt had coude catheter placed in the office yesterday.

## 2020-06-15 NOTE — Progress Notes (Signed)
06/17/2020 4:17 PM   Jason Murphy Jul 16, 1920 [id number]  Referring provider: Baxter Hire, MD [place],  [place] [address] Chief Complaint  Patient presents with  . Prostate Cancer    Discuss treatment    HPI: Jason Murphy is a 86 y.o. male who returns for a 1 week follow up of prostate cancer and to discuss treatment options. Patient is accompanied by his daughter in law today.  PSA 2,355  Whole body scan on 06/03/2020 showed diffuse bone metastases throughout the axial and proximal appendicular skeleton.  CT A/P w/ contrast 06/03/2020 showed enlarged prostate with diffuse enhancement and ill-defined margins, consistent with known prostate carcinoma. Bilateral seminal vesicle involvement noted. Mild left external iliac lymphadenopathy, consistent with metastatic disease. Diffuse sclerotic bone metastases. Increased diffuse bladder wall thickening and trabeculation, most likely due to chronic bladder outlet obstruction given enlarge prostate. Bilateral nephrolithiasis. No evidence of ureteral calculi or Hydronephrosis. Colonic diverticulosis. No radiographic evidence of diverticulitis. Mild hepatic steatosis. 5.0 cm infrarenal abdominal aortic aneurysm.  Patient was last seen by Dr. Bernardo Heater on 06/12/2020. He complained of continued pain and discomfort with catheterization (let catheter accidentally dry out).  He was able to catheterized on arrival to office and urine was blood-tinged. Had been on tamsulosin but stopped secondary to orthostatic symptoms.  Patient self-removed indwelling catheter on 06/13/2020. He noted hematuria. Patient went to the ER but was referred back to urology clinic. UA showed gross hematuria, moderate leukocytes, >50 RBC, > 50 WBC, and many bacteria.   Called in Keflex for presumed UTI which he started yesterday.  No fevers, otherwise asymptomatic.    He has some discomfort at the site of his catheter. Does not tolerate it  well.  He reports decreased libido and is not sexually active since his wife passed 3 years ago.    PMH: Past Medical History:  Diagnosis Date  . Cancer [facility])    prostate  . Coronary artery disease   . Dizziness    chronic dizziness  . Dyspnea   . Fatigue    Progressive fatigue with question of sleep apnea  . Hypercholesterolemia   . Hypertension   . Obstructive sleep apnea   . Pneumonia   . Restless leg syndrome     Surgical History: Past Surgical History:  Procedure Laterality Date  . APPENDECTOMY    . CARDIAC CATHETERIZATION     Ejection Fraction was 60%, stent  . KNEE SURGERY     arthroscopic left knee surgery  . SHOULDER ARTHROSCOPY      Home Medications:  Allergies as of 06/17/2020   No Known Allergies     Medication List       Accurate as of June 17, 2020  4:17 PM. If you have any questions, ask your nurse or doctor.        STOP taking these medications   predniSONE 10 MG tablet Commonly known as: DELTASONE Stopped by: Hollice Espy, MD     TAKE these medications   AMBULATORY NON FORMULARY MEDICATION Trimix (30/1/10)-(Pap/Phent/PGE)  Test Dose  68ml vial   Qty #3 Refills 0  [facility] [phone] Fax [phone]   aspirin EC 81 MG tablet Take 81 mg by mouth at bedtime.   cephALEXin 500 MG capsule Commonly known as: Keflex Take 1 capsule (500 mg total) by mouth 4 (four) times daily for 5 days.   co-enzyme Q-10 50 MG capsule Take 100 mg by mouth every morning.   finasteride 5  MG tablet Commonly known as: PROSCAR Take by mouth.   isosorbide mononitrate 30 MG 24 hr tablet Commonly known as: IMDUR   lansoprazole 15 MG capsule Commonly known as: PREVACID Take 15-30 mg by mouth See admin instructions. Take 1 capsule by mouth every other day alternating with 2 capsules (30mg ) by mouth every other day.   levothyroxine 50 MCG tablet Commonly known as: SYNTHROID Take 50 mcg by mouth daily before breakfast.   meclizine  25 MG tablet Commonly known as: ANTIVERT Take 25 mg by mouth 2 (two) times daily as needed for dizziness.   nitroGLYCERIN 0.4 MG SL tablet Commonly known as: NITROSTAT Place 0.4 mg under the tongue every 5 (five) minutes as needed for chest pain.   oxyCODONE-acetaminophen 5-325 MG tablet Commonly known as: PERCOCET/ROXICET Take 1 tablet by mouth every 6 (six) hours as needed for severe pain.   PRESERVISION AREDS 2 PO Take 1 tablet by mouth daily.   rOPINIRole 1 MG tablet Commonly known as: REQUIP Take 1 mg by mouth 3 (three) times daily.   sildenafil 25 MG tablet Commonly known as: VIAGRA Take by mouth.   simvastatin 40 MG tablet Commonly known as: ZOCOR TAKE 1 TABLET BY MOUTH EVERY NIGHT AT BEDTIME   vitamin B-12 500 MCG tablet Commonly known as: CYANOCOBALAMIN Take 500 mcg by mouth every morning.   Vitamin D3 25 MCG (1000 UT) Caps Take 1,000 Units by mouth daily.       Allergies: No Known Allergies  Family History: Family History  Problem Relation Age of Onset  . COPD Mother     Social History:  reports that he has never smoked. He has never used smokeless tobacco. He reports current alcohol use. He reports that he does not use drugs.   Physical Exam: BP 126/77   Pulse 82   Ht 5\' 9"  (1.753 m)   Wt 170 lb (77.1 kg)   BMI 25.10 kg/m   Constitutional:  Alert and oriented, No acute distress. HEENT: [place] AT, moist mucus membranes.  Trachea midline, no masses. Cardiovascular: No clubbing, cyanosis, or edema. Respiratory: Normal respiratory effort, no increased work of breathing. GU: Foley in place.   Skin: No rashes, bruises or suspicious lesions. Neurologic: Grossly intact, no focal deficits, moving all 4 extremities. Psychiatric: Normal mood and affect.  Laboratory Data:  Lab Results  Component Value Date   CREATININE 1.10 06/13/2020    Pertinent Imaging: CLINICAL DATA:  Newly diagnosed prostate carcinoma.  EXAM: NUCLEAR MEDICINE WHOLE BODY BONE  SCAN  TECHNIQUE: Whole body anterior and posterior images were obtained approximately 3 hours after intravenous injection of radiopharmaceutical.  RADIOPHARMACEUTICALS:  24.3 mCi Technetium-16m MDP IV  COMPARISON:  None.  FINDINGS: Innumerable sites of increased radiopharmaceutical uptake are seen throughout the axial skeleton, and proximal appendicular skeleton bilaterally, consistent with diffuse bone metastases.  IMPRESSION: Diffuse bone metastases throughout the axial and proximal appendicular skeleton.   Electronically Signed   By: Marlaine Hind M.D.   On: 06/03/2020 14:56  CLINICAL DATA:  Newly diagnosed prostate carcinoma.  Staging.  EXAM: CT ABDOMEN AND PELVIS WITH CONTRAST  TECHNIQUE: Multidetector CT imaging of the abdomen and pelvis was performed using the standard protocol following bolus administration of intravenous contrast.  CONTRAST:  114mL OMNIPAQUE IOHEXOL 300 MG/ML  SOLN  COMPARISON:  01/01/2012  FINDINGS: Lower Chest: No acute findings.  Hepatobiliary: Multiple benign-appearing cysts are again seen in both the right and left hepatic lobes. No hepatic masses identified. Mild diffuse hepatic steatosis again  demonstrated. Prior cholecystectomy. No evidence of biliary obstruction.  Pancreas:  No mass or inflammatory changes.  Spleen: Within normal limits in size and appearance.  Adrenals/Urinary Tract: No masses identified. A few tiny renal cysts are seen bilaterally. Several small bilateral renal calculi are seen, however there is no evidence ureteral calculi or hydronephrosis. Increase diffuse bladder wall thickening and trabeculation is seen, most likely due to chronic bladder outlet obstruction given enlarged prostate.  Stomach/Bowel: No evidence of obstruction, inflammatory process or abnormal fluid collections. Diverticulosis is seen mainly involving the sigmoid colon, however there is no evidence of  diverticulitis.  Vascular/Lymphatic: A 1.3 cm left external iliac lymph node is seen on image 69/2. No other pathologically enlarged lymph nodes. A 5.0 cm infrarenal abdominal aortic aneurysm is seen, increased in size from 3.7 cm on 2013 exam. No evidence of aneurysm leak or rupture.  Reproductive: Moderate enlargement of the prostate gland is new since previous study with diffuse enhancement and ill-defined margins, consistent with prostate carcinoma. Involvement of seminal vesicles is seen bilaterally.  Other:  None.  Musculoskeletal: A numerous ill-defined sclerotic bone lesions are seen involving the ribs, thoracolumbar spine, and pelvis, consistent with diffuse bone metastases.  IMPRESSION: Enlarged prostate with diffuse enhancement and ill-defined margins, consistent with known prostate carcinoma. Bilateral seminal vesicle involvement noted.  Mild left external iliac lymphadenopathy, consistent with metastatic disease.  Diffuse sclerotic bone metastases.  Increased diffuse bladder wall thickening and trabeculation, most likely due to chronic bladder outlet obstruction given enlarged prostate.  Bilateral nephrolithiasis. No evidence of ureteral calculi or hydronephrosis.  Colonic diverticulosis. No radiographic evidence of diverticulitis.  Mild hepatic steatosis.  5.0 cm infrarenal abdominal aortic aneurysm. Recommend follow-up every 6 months and vascular consultation. This recommendation follows ACR consensus guidelines: White Paper of the ACR Incidental Findings Committee II on Vascular Findings. J Am Coll Radiol 2013; 10:789-794.   Electronically Signed   By: Marlaine Hind M.D.   On: 06/03/2020 14:46  I have personally reviewed the images and agree with radiologist interpretation.    Assessment & Plan:    1. Urinary retention Patient has gone into urinary retention sine last visit. Advised patient to keep catheter in place at least one  week in setting of urethral trauma, then consider return to [facility] until spontaneously voiding Follow up with PA for cath removal, repeat instruction on [facility]  2. Advanced metastatic prostate cancer  Strongly recommend treatment for symptomatic relief of retention and progression- lengthy discussion today  Recommend degarelix and transition to Lupron- side effects discussed in detail Consider addition of oral antiandrogen, will discuss further at next visit He is not a good candidate for chemotherapy treatment given age Bone health discussed, recommend calcium/ vitamin D supplementation, weight bearing exercise Patient agreed.   Follow up in 1 month for PSA, testosterone, Eligard.  [facility] [address] [place], [place] [address] [phone]  I, Selena Batten, am acting as a scribe for Dr. Hollice Espy.  I have reviewed the above documentation for accuracy and completeness, and I agree with the above.   Hollice Espy, MD  I spent 40 total minutes on the day of the encounter including pre-visit review of the medical record, face-to-face time with the patient, and post visit ordering of labs/imaging/tests.

## 2020-06-16 ENCOUNTER — Telehealth: Payer: Self-pay

## 2020-06-16 ENCOUNTER — Ambulatory Visit: Payer: Self-pay | Admitting: Urology

## 2020-06-16 MED ORDER — CEPHALEXIN 500 MG PO CAPS: 500.0000 mg | ORAL_CAPSULE | Freq: Four times a day (QID) | ORAL | 0 refills | Status: AC

## 2020-06-16 NOTE — Telephone Encounter (Signed)
Pt aware, sent medication to pharmacy

## 2020-06-16 NOTE — Telephone Encounter (Signed)
-----   Message from Hollice Espy, MD sent at 06/15/2020  7:59 AM EDT ----- Jason Murphy was in the ED over the weekend with probable UTI that doesn't look like it was treated.  Please start keflex 500 mg qid x 5 days.  Hollice Espy, MD

## 2020-06-17 ENCOUNTER — Ambulatory Visit: Payer: Medicare HMO | Admitting: Urology

## 2020-06-17 ENCOUNTER — Encounter: Payer: Self-pay | Admitting: Urology

## 2020-06-17 ENCOUNTER — Other Ambulatory Visit: Payer: Self-pay

## 2020-06-17 ENCOUNTER — Telehealth: Payer: Self-pay

## 2020-06-17 VITALS — BP 126/77 | HR 82 | Ht 69.0 in | Wt 170.0 lb

## 2020-06-17 DIAGNOSIS — C61 Malignant neoplasm of prostate: Secondary | ICD-10-CM

## 2020-06-17 MED ORDER — DEGARELIX ACETATE(240 MG DOSE) 120 MG/VIAL ~~LOC~~ SOLR
240.0000 mg | Freq: Once | SUBCUTANEOUS | Status: AC
  Administered 2020-06-17: 240 mg via SUBCUTANEOUS

## 2020-06-17 NOTE — Telephone Encounter (Signed)
[facility] Medicare [phone]) to check benefits and if PA was needed for [facility] 240mg  [place] cpt 28638. Spoke with Belenda Cruise no PA needed ref # [id number].  Also started investigation online. Per Dr. Erlene Quan patient needs medication today. Contacted via phone to expedite. Called [phone] to confirm medication coverage. Spoke with [employer] No PA required ref# [id number].

## 2020-06-17 NOTE — Patient Instructions (Addendum)
Remember to start Vitamin D and Calcium daily  Degarelix injection What is this medicine? DEGARELIX (deg a REL ix) is used to treat men with advanced prostate cancer. This medicine may be used for other purposes; ask your health care provider or pharmacist if you have questions. COMMON BRAND NAME(S): Degarelix, Mills Koller What should I tell my health care provider before I take this medicine? They need to know if you have any of these conditions:  diabetes  heart disease  kidney disease  liver disease  low levels of potassium or magnesium in the blood  osteoporosis  an unusual or allergic reaction to degarelix, mannitol, other medicines, foods, dyes, or preservatives  pregnant or trying to get pregnant  breast-feeding How should I use this medicine? This medicine is for injection under the skin. It is usually given by a health care professional in a hospital or clinic setting. If you get this medicine at home, you will be taught how to prepare and give this medicine. Use exactly as directed. Take your medicine at regular intervals. Do not take it more often than directed. It is important that you put your used needles and syringes in a special sharps container. Do not put them in a trash can. If you do not have a sharps container, call your pharmacist or healthcare provider to get one. Talk to your pediatrician regarding the use of this medicine in children. Special care may be needed. Overdosage: If you think you have taken too much of this medicine contact a poison control center or emergency room at once. NOTE: This medicine is only for you. Do not share this medicine with others. What if I miss a dose? Try not to miss a dose. If you do miss a dose, call your doctor or health care professional for advice. What may interact with this medicine? Do not take this medicine with any of the following  medications:  amiodarone  bretylium  disopyramide  droperidol  ibutilide  procainamide  quinidine  sotalol This medicine may also interact with the following medications:  dofetilide This list may not describe all possible interactions. Give your health care provider a list of all the medicines, herbs, non-prescription drugs, or dietary supplements you use. Also tell them if you smoke, drink alcohol, or use illegal drugs. Some items may interact with your medicine. What should I watch for while using this medicine? Visit your doctor or health care professional for regular checks on your progress and discuss any issues before you start taking this medicine. Do not rub or scratch injection site. There may be a lump at the injection site, or it may be red or sore for a few days after your dose. Your doctor or health care professional will need to monitor your hormone levels in your blood to check your response to treatment. Try to keep any appointments for testing. What side effects may I notice from receiving this medicine? Side effects that you should report to your doctor or health care professional as soon as possible:  allergic reactions like skin rash, itching or hives, swelling of the face, lips, or tongue  fever or chills  irregular heartbeat  nausea and vomiting along with severe abdominal pain  pain or difficulty passing urine  pelvic pain or bloating  signs and symptoms of high blood sugar such as being more thirsty or hungry or having to urinate more than normal. You may also feel very tired or have blurry vision Side effects that usually do not  require medical attention (report to your doctor or health care professional if they continue or are bothersome):  change in sex drive or performance  constipation  headache  high blood pressure  hot flashes (flushing of skin, increased sweating)  itching, redness or mild pain at site where injected  joint  pain  trouble sleeping  unusually weak or tired  weight gain This list may not describe all possible side effects. Call your doctor for medical advice about side effects. You may report side effects to FDA at 1-800-FDA-1088. Where should I keep my medicine? Keep out of the reach of children. This drug is usually given in a hospital or clinic and will not be stored at home. In rare cases, this medicine may be given at home. If you are using this medicine at home, you will be instructed on how to store this medicine. Throw away any unused medicine after the expiration date on the label. NOTE: This sheet is a summary. It may not cover all possible information. If you have questions about this medicine, talk to your doctor, pharmacist, or health care provider.  2020 Elsevier/Gold Standard (2018-12-17 12:20:52)

## 2020-06-17 NOTE — Progress Notes (Signed)
Firmagon Sub Q Injection  Due to Prostate Cancer patient is present today for a Firmagon Injection.   Medication: Mills Koller (Degarelix)  Dose: 240mg  Location: right &left upper abdomen Lot: [id number] Exp: 12/2021  Patient tolerated well, no complications were noted  Performed by: Fonnie Jarvis, [place], RMA  Follow up: 1 mo

## 2020-06-23 ENCOUNTER — Emergency Department
Admission: EM | Admit: 2020-06-23 | Discharge: 2020-06-23 | Disposition: A | Discharge status: Home / Self Care | Payer: Medicare HMO | Attending: Emergency Medicine | Admitting: Emergency Medicine

## 2020-06-23 ENCOUNTER — Emergency Department: Payer: Medicare HMO

## 2020-06-23 ENCOUNTER — Other Ambulatory Visit: Payer: Self-pay

## 2020-06-23 ENCOUNTER — Encounter: Payer: Self-pay | Admitting: Emergency Medicine

## 2020-06-23 DIAGNOSIS — I2510 Atherosclerotic heart disease of native coronary artery without angina pectoris: Secondary | ICD-10-CM | POA: Diagnosis not present

## 2020-06-23 DIAGNOSIS — R42 Dizziness and giddiness: Secondary | ICD-10-CM | POA: Diagnosis not present

## 2020-06-23 DIAGNOSIS — I10 Essential (primary) hypertension: Secondary | ICD-10-CM | POA: Insufficient documentation

## 2020-06-23 DIAGNOSIS — E860 Dehydration: Secondary | ICD-10-CM | POA: Diagnosis not present

## 2020-06-23 DIAGNOSIS — Z8546 Personal history of malignant neoplasm of prostate: Secondary | ICD-10-CM | POA: Diagnosis not present

## 2020-06-23 DIAGNOSIS — Z79899 Other long term (current) drug therapy: Secondary | ICD-10-CM | POA: Insufficient documentation

## 2020-06-23 DIAGNOSIS — Z7982 Long term (current) use of aspirin: Secondary | ICD-10-CM | POA: Diagnosis not present

## 2020-06-23 DIAGNOSIS — C61 Malignant neoplasm of prostate: Secondary | ICD-10-CM | POA: Diagnosis not present

## 2020-06-23 LAB — COMPREHENSIVE METABOLIC PANEL
ALT: 13 U/L (ref 0–44)
AST: 30 U/L (ref 15–41)
Albumin: 3.5 g/dL (ref 3.5–5.0)
Alkaline Phosphatase: 168 U/L — ABNORMAL HIGH (ref 38–126)
Anion gap: 8 (ref 5–15)
BUN: 16 mg/dL (ref 8–23)
CO2: 28 mmol/L (ref 22–32)
Calcium: 8.5 mg/dL — ABNORMAL LOW (ref 8.9–10.3)
Chloride: 104 mmol/L (ref 98–111)
Creatinine, Ser: 0.88 mg/dL (ref 0.61–1.24)
GFR calc Af Amer: >60 mL/min (ref >60)
GFR calc non Af Amer: >60 mL/min (ref >60)
Glucose, Bld: 131 mg/dL — ABNORMAL HIGH (ref 70–99)
Potassium: 4.3 mmol/L (ref 3.5–5.1)
Sodium: 140 mmol/L (ref 135–145)
Total Bilirubin: 0.9 mg/dL (ref 0.3–1.2)
Total Protein: 6.7 g/dL (ref 6.5–8.1)

## 2020-06-23 LAB — URINALYSIS, COMPLETE (UACMP) WITH MICROSCOPIC
Bilirubin Urine: NEGATIVE
Glucose, UA: NEGATIVE mg/dL
Ketones, ur: NEGATIVE mg/dL
Nitrite: NEGATIVE
Protein, ur: 100 mg/dL — AB
RBC / HPF: >50 RBC/hpf — ABNORMAL HIGH (ref 0–5)
Specific Gravity, Urine: 1.031 — ABNORMAL HIGH (ref 1.005–1.030)
Squamous Epithelial / HPF: NONE SEEN (ref 0–5)
WBC, UA: >50 WBC/hpf — ABNORMAL HIGH (ref 0–5)
pH: 5 (ref 5.0–8.0)

## 2020-06-23 LAB — CBC WITH DIFFERENTIAL/PLATELET
Abs Immature Granulocytes: 0.02 10*3/uL (ref 0.00–0.07)
Basophils Absolute: 0 10*3/uL (ref 0.0–0.1)
Basophils Relative: 0 %
Eosinophils Absolute: 0.2 10*3/uL (ref 0.0–0.5)
Eosinophils Relative: 2 %
HCT: 35.9 % — ABNORMAL LOW (ref 39.0–52.0)
Hemoglobin: 12.2 g/dL — ABNORMAL LOW (ref 13.0–17.0)
Immature Granulocytes: 0 %
Lymphocytes Relative: 22 %
Lymphs Abs: 1.8 10*3/uL (ref 0.7–4.0)
MCH: 32.3 pg (ref 26.0–34.0)
MCHC: 34 g/dL (ref 30.0–36.0)
MCV: 95 fL (ref 80.0–100.0)
Monocytes Absolute: 0.7 10*3/uL (ref 0.1–1.0)
Monocytes Relative: 9 %
Neutro Abs: 5.3 10*3/uL (ref 1.7–7.7)
Neutrophils Relative %: 67 %
Platelets: 362 10*3/uL (ref 150–400)
RBC: 3.78 MIL/uL — ABNORMAL LOW (ref 4.22–5.81)
RDW: 13.4 % (ref 11.5–15.5)
WBC: 7.9 10*3/uL (ref 4.0–10.5)
nRBC: 0 % (ref 0.0–0.2)

## 2020-06-23 LAB — LACTIC ACID, PLASMA: Lactic Acid, Venous: 1.4 mmol/L (ref 0.5–1.9)

## 2020-06-23 MED ORDER — SODIUM CHLORIDE 0.9 % IV SOLN: Freq: Once | INTRAVENOUS | Status: AC

## 2020-06-23 NOTE — ED Notes (Signed)
Urine drainage bag from home not draining correctly. Old blood noted around penis. Drainage  bag changed out. Peri care provided. Urine sample to be sent next time available in new line. Visitor at bedside. Pt repositioned.

## 2020-06-23 NOTE — ED Notes (Signed)
500cc left of NS bolus; to be placed on IV pump instead of gravity now.

## 2020-06-23 NOTE — ED Notes (Signed)
Visitor remains at bedside with pt. Visitor works here and gave pt drink/snack.

## 2020-06-23 NOTE — ED Provider Notes (Signed)
[facility] Emergency Department Provider Note   ____________________________________________   First MD Initiated Contact with Patient 06/23/20 1310     (approximate)  I have reviewed the triage vital signs and the nursing notes.   HISTORY  Chief Complaint Dizziness    HPI Jason Murphy is a 86 y.o. male with a history of prostate cancer currently on Lupron treatment who presents for lightheadedness.  Patient states that whenever he gets up from sitting down or laying down he becomes acutely lightheaded.  Patient states that he has had decreased p.o. intake over the last week after his first Lupron injection and feels that this is the reason for his lightheadedness.  Patient states that this lightheadedness somewhat improves after sitting up for a prolonged period of time.  Patient denies any recent medication changes.         Past Medical History:  Diagnosis Date  . Cancer [facility])    prostate  . Coronary artery disease   . Dizziness    chronic dizziness  . Dyspnea   . Fatigue    Progressive fatigue with question of sleep apnea  . Hypercholesterolemia   . Hypertension   . Obstructive sleep apnea   . Pneumonia   . Restless leg syndrome     Patient Active Problem List   Diagnosis Date Noted  . Hypertension   . Coronary artery disease   . RLS (restless legs syndrome) 09/21/2017  . Paroxysmal A-fib ([place]) 01/08/2016  . Mixed hyperlipidemia 01/08/2016  . Hyponatremia 01/03/2016  . Anxiety state 02/03/2015  . Stress at home 02/03/2015  . OBSTRUCTIVE SLEEP APNEA 10/22/2010  . PROSTATE CANCER 10/21/2010  . HYPERCHOLESTEROLEMIA 10/21/2010  . HYPERTENSION 10/21/2010  . CORONARY ARTERY DISEASE 10/21/2010  . DIZZINESS, CHRONIC 10/21/2010    Past Surgical History:  Procedure Laterality Date  . APPENDECTOMY    . CARDIAC CATHETERIZATION     Ejection Fraction was 60%, stent  . KNEE SURGERY     arthroscopic left knee surgery  . SHOULDER  ARTHROSCOPY      Prior to Admission medications   Medication Sig Start Date End Date Taking? Authorizing Provider  AMBULATORY NON FORMULARY MEDICATION Trimix (30/1/10)-(Pap/Phent/PGE)  Test Dose  24ml vial   Qty #3 [place] [phone] Fax [phone] 04/29/20   Hollice Espy, MD  aspirin EC 81 MG tablet Take 81 mg by mouth at bedtime.    [provider]  Cholecalciferol (VITAMIN D3) 1000 units CAPS Take 1,000 Units by mouth daily.    [provider]  co-enzyme Q-10 50 MG capsule Take 100 mg by mouth every morning.     [provider]  cyanocobalamin 500 MCG tablet Take 500 mcg by mouth every morning.    [provider]  finasteride (PROSCAR) 5 MG tablet Take by mouth. 04/17/20 04/17/21  [provider]  isosorbide mononitrate (IMDUR) 30 MG 24 hr tablet  04/09/20   [provider]  lansoprazole (PREVACID) 15 MG capsule Take 15-30 mg by mouth See admin instructions. Take 1 capsule by mouth every other day alternating with 2 capsules (30mg ) by mouth every other day.    [provider]  levothyroxine (SYNTHROID, LEVOTHROID) 50 MCG tablet Take 50 mcg by mouth daily before breakfast.    [provider]  meclizine (ANTIVERT) 25 MG tablet Take 25 mg by mouth 2 (two) times daily as needed for dizziness.    [provider]  Multiple Vitamins-Minerals (PRESERVISION AREDS 2 PO)  Take 1 tablet by mouth daily.    [provider]  nitroGLYCERIN (NITROSTAT) 0.4 MG SL tablet Place 0.4 mg under the tongue every 5 (five) minutes as needed for chest pain.    [provider]  oxyCODONE-acetaminophen (PERCOCET/ROXICET) 5-325 MG tablet Take 1 tablet by mouth every 6 (six) hours as needed for severe pain. 06/13/20   Cuthriell, Charline Bills, PA-C  rOPINIRole (REQUIP) 1 MG tablet Take 1 mg by mouth 3 (three) times daily. 03/10/20   [provider]  sildenafil (VIAGRA) 25 MG tablet Take by  mouth. 04/17/20 05/17/20  [provider]  simvastatin (ZOCOR) 40 MG tablet TAKE 1 TABLET BY MOUTH EVERY NIGHT AT BEDTIME 05/26/11   [place]ique, Peter M, MD  pramipexole (MIRAPEX) 0.5 MG tablet Take 0.5 mg by mouth at bedtime. 09/18/17 03/15/20  [provider]    Allergies Patient has no known allergies.  Family History  Problem Relation Age of Onset  . COPD Mother     Social History Social History   Tobacco Use  . Smoking status: Never Smoker  . Smokeless tobacco: Never Used  Vaping Use  . Vaping Use: Never used  Substance Use Topics  . Alcohol use: Yes    Comment: 1/2 glass wine daily  . Drug use: No    Review of Systems Constitutional: No fever/chills Eyes: No visual changes. ENT: No sore throat. Cardiovascular: Denies chest pain. Respiratory: Denies shortness of breath. Gastrointestinal: No abdominal pain.  No nausea, no vomiting.  No diarrhea. Genitourinary: Negative for dysuria. Musculoskeletal: Negative for acute arthralgias Skin: Negative for rash. Neurological: Negative for headaches, weakness/numbness/paresthesias in any extremity Psychiatric: Negative for suicidal ideation/homicidal ideation   ____________________________________________   PHYSICAL EXAM:  VITAL SIGNS: ED Triage Vitals  Enc Vitals Group     BP 06/23/20 1222 (!) 85/52     Pulse Rate 06/23/20 1222 63     Resp 06/23/20 1222 18     Temp 06/23/20 1222 97.6 F (36.4 C)     Temp Source 06/23/20 1222 Oral     SpO2 06/23/20 1222 99 %     Weight 06/23/20 1236 169 lb 15.6 oz (77.1 kg)     Height 06/23/20 1236 5\' 9"  (1.753 m)     Head Circumference --      Peak Flow --      Pain Score 06/23/20 1231 3     Pain Loc --      Pain Edu? --      Excl. in [place]? --    Constitutional: Alert and oriented. Well appearing and in no acute distress. Eyes: Conjunctivae are normal. PERRL. EOMI. Head: Atraumatic. Nose: No congestion/rhinnorhea. Mouth/Throat: Mucous membranes are moist.   Oropharynx non-erythematous. Neck: No stridor.   Cardiovascular: Normal rate, regular rhythm. Grossly normal heart sounds.  Good peripheral circulation. Respiratory: Normal respiratory effort.  No retractions. Lungs CTAB. Gastrointestinal: Soft and nontender. No distention. No abdominal bruits. No CVA tenderness. Musculoskeletal: No lower extremity tenderness nor edema.  No joint effusions. Neurologic:  Normal speech and language. No gross focal neurologic deficits are appreciated. No gait instability. Skin:  Skin is warm, dry and intact. No rash noted. Psychiatric: Mood and affect are normal. Speech and behavior are normal.  ____________________________________________   LABS (all labs ordered are listed, but only abnormal results are displayed)  Labs Reviewed  COMPREHENSIVE METABOLIC PANEL - Abnormal; Notable for the following components:      Result Value   Glucose, Bld 131 (*)  Calcium 8.5 (*)    Alkaline Phosphatase 168 (*)    All other components within normal limits  CBC WITH DIFFERENTIAL/PLATELET - Abnormal; Notable for the following components:   RBC 3.78 (*)    Hemoglobin 12.2 (*)    HCT 35.9 (*)    All other components within normal limits  URINALYSIS, COMPLETE (UACMP) WITH MICROSCOPIC - Abnormal; Notable for the following components:   Color, Urine AMBER (*)    APPearance CLOUDY (*)    Specific Gravity, Urine 1.031 (*)    Hgb urine dipstick MODERATE (*)    Protein, ur 100 (*)    Leukocytes,Ua MODERATE (*)    RBC / HPF >50 (*)    WBC, UA >50 (*)    Bacteria, UA RARE (*)    All other components within normal limits  LACTIC ACID, PLASMA  LACTIC ACID, PLASMA   ____________________________________________  EKG  ED ECG REPORT I, Naaman Plummer, the attending physician, personally viewed and interpreted this ECG.  Date: 06/23/2020 EKG Time: 1221 Rate: 75 Rhythm: normal sinus rhythm QRS Axis: normal Intervals: normal ST/T Wave abnormalities:  normal Narrative Interpretation: no evidence of acute ischemia  ____________________________________________  RADIOLOGY  ED MD interpretation: 2 view chest x-ray shows chronic mild bilateral interstitial prominence and bony metastatic changes out any acute infiltrate or change from previous exam  Official radiology report(s): DG Chest 2 View  Result Date: 06/23/2020 CLINICAL DATA:  Dizziness.  Prostate cancer.  Immunocompromised. EXAM: CHEST - 2 VIEW COMPARISON:  01/05/2016.  CT abdomen 06/03/2020 FINDINGS: Mediastinum hilar structures normal. Heart size normal. Chronic mild bilateral interstitial prominence. No focal infiltrate. No prominent pleural effusion. No pneumothorax. Degenerative change and scoliosis thoracic spine. Bony metastatic changes best identified by prior CT. IMPRESSION: 1. Chronic mild bilateral interstitial lung disease. No focal infiltrate. 2. Bony metastatic changes best identified by prior CT of 06/03/2020. Electronically Signed   By: Marcello Moores  Register   On: 06/23/2020 13:10    ____________________________________________   PROCEDURES  Procedure(s) performed (including Critical Care):  Procedures   ____________________________________________   INITIAL IMPRESSION / ASSESSMENT AND PLAN / ED COURSE  @[email or web address] @       Patient is a 86 year old male with a stated past medical history of prostate cancer currently on Lupron treatment who presents for worsening lightheadedness over the past week.  Differential diagnosis includes dehydration, CVA, electrolyte abnormality, or medication overdose.  Given history, physical exam, laboratory/radiologic evaluation.  Patient is likely dehydrated as his pressure significantly improved after IV fluid administration.  Given patient admits to poor p.o. intake over the last week, it is likely that patient will benefit from increased p.o. in the outpatient setting.  Patient agrees with plan for discharge home and follow-up  with primary care physician as well as hematology/oncology within the next 1-3 days for further evaluation and management.      ____________________________________________   FINAL CLINICAL IMPRESSION(S) / ED DIAGNOSES  Final diagnoses:  Dehydration  Lightheadedness     ED Discharge Orders    None       Note:  This document was prepared using Dragon voice recognition software and may include unintentional dictation errors.   Naaman Plummer, MD 06/23/20 [phone]

## 2020-06-23 NOTE — ED Notes (Signed)
RN Metta Clines aware of pt in room

## 2020-06-23 NOTE — ED Notes (Signed)
HOB adjusted for pt as requested. Denied any other needs.

## 2020-06-23 NOTE — ED Triage Notes (Signed)
Pt daughter reports dizziness this am and weakness with decreased PO intake. Pt also just completed a treatment for UTI.

## 2020-06-23 NOTE — ED Notes (Signed)
Pt denies dizziness and nausea. States he would like to walk around. Pt wheeled out to vehicle.

## 2020-06-23 NOTE — ED Triage Notes (Signed)
Pt via pov from home with dizziness x 1 week. Pt states he has prostate cancer and that he has not been taking his medications "because I am a dumb head." pt is receiving treatments for prostate cancer at this time. Pt alert & oriented, nad noted.

## 2020-06-23 NOTE — ED Notes (Signed)
Pt c/o dec appetite for a week; dizziness; recent prostate cancer dx; in with hypotension; foley cath placed last week urine close to amber and cloudy.

## 2020-06-23 NOTE — ED Notes (Signed)
Pt leaving for imaging. Pt alert, resp reg/unlabored, skin dry, laying calmly in bed.

## 2020-06-23 NOTE — ED Notes (Signed)
Pt signed printed d/c paperwork as topaz frozen.

## 2020-06-23 NOTE — ED Notes (Signed)
Pt reports loss of smell/taste. Provider [place] notified.

## 2020-06-25 ENCOUNTER — Ambulatory Visit (INDEPENDENT_AMBULATORY_CARE_PROVIDER_SITE_OTHER): Payer: Medicare HMO | Admitting: Physician Assistant

## 2020-06-25 ENCOUNTER — Encounter: Payer: Self-pay | Admitting: Physician Assistant

## 2020-06-25 ENCOUNTER — Other Ambulatory Visit: Payer: Self-pay

## 2020-06-25 VITALS — BP 101/42 | HR 56 | Ht 69.0 in | Wt 158.5 lb

## 2020-06-25 DIAGNOSIS — R339 Retention of urine, unspecified: Secondary | ICD-10-CM | POA: Diagnosis not present

## 2020-06-25 NOTE — Progress Notes (Signed)
Patient presented to clinic today for voiding trial and CIC teaching today. He is accompanied by his daughter, [name]. Patient reports severe discomfort with CIC in the past that daughter clarifies was secondary to passing an unlubricated catheter into the bladder as well as traumatic self-removal of Foley approximately 2 weeks ago. Patient refusing CIC teaching at this time and would like to leave Foley in place with plans for voiding trial in 2 weeks to allow additional time for healing following urethral trauma. He is unsure if he will be interested in reattempting CIC at that time if voiding trial is unsuccessful and states he would prefer to have Foley replaced if that occurs.  Voiding trial scheduled for 2 weeks.

## 2020-07-06 ENCOUNTER — Ambulatory Visit: Payer: Self-pay

## 2020-07-06 ENCOUNTER — Other Ambulatory Visit: Payer: Self-pay

## 2020-07-06 ENCOUNTER — Ambulatory Visit (INDEPENDENT_AMBULATORY_CARE_PROVIDER_SITE_OTHER): Payer: Medicare HMO

## 2020-07-06 DIAGNOSIS — Z466 Encounter for fitting and adjustment of urinary device: Secondary | ICD-10-CM

## 2020-07-06 DIAGNOSIS — Z87898 Personal history of other specified conditions: Secondary | ICD-10-CM

## 2020-07-06 LAB — BLADDER SCAN AMB NON-IMAGING

## 2020-07-06 NOTE — Progress Notes (Signed)
Fill and Pull Catheter Removal  Patient is present today for a catheter removal. Patient was cleaned and prepped in a sterile fashion 173ml of sterile water/ saline was instilled into the bladder when the patient felt the urge to urinate. 79ml of water was then drained from the balloon.  A 16FR Coude foley cath was removed from the bladder no complications were noted. Patient as then given some time to void on their own. Patient was not able to void on their own after some time. Patient sent home, advised to push fluids.  Patient tolerated well.  Performed by: Gordy Clement, CMA  Follow up/ Additional notes: RTC this afternoon for PVR   Patient returns to clinic for afternoon PVR, patient states he went home and pushed fluids as advised and was able to void 2 times. PVR 184mL. Patient released from clinic and given warning signs of retention. Advised to call us should issues arise or seek care in the ED. Patient gave verbal understanding.

## 2020-07-13 ENCOUNTER — Telehealth: Payer: Self-pay

## 2020-07-13 NOTE — Telephone Encounter (Signed)
PA forms submitted for Eligard/Lupron PA.

## 2020-07-16 DIAGNOSIS — E039 Hypothyroidism, unspecified: Secondary | ICD-10-CM | POA: Diagnosis not present

## 2020-07-16 DIAGNOSIS — I2510 Atherosclerotic heart disease of native coronary artery without angina pectoris: Secondary | ICD-10-CM | POA: Diagnosis not present

## 2020-07-17 DIAGNOSIS — E039 Hypothyroidism, unspecified: Secondary | ICD-10-CM | POA: Diagnosis not present

## 2020-07-17 DIAGNOSIS — I2510 Atherosclerotic heart disease of native coronary artery without angina pectoris: Secondary | ICD-10-CM | POA: Diagnosis not present

## 2020-07-17 DIAGNOSIS — R270 Ataxia, unspecified: Secondary | ICD-10-CM | POA: Diagnosis not present

## 2020-07-17 DIAGNOSIS — I480 Paroxysmal atrial fibrillation: Secondary | ICD-10-CM | POA: Diagnosis not present

## 2020-07-21 ENCOUNTER — Encounter: Payer: Self-pay | Admitting: Urology

## 2020-07-21 ENCOUNTER — Ambulatory Visit: Payer: Self-pay | Admitting: Urology

## 2020-07-22 DIAGNOSIS — R339 Retention of urine, unspecified: Secondary | ICD-10-CM | POA: Diagnosis not present

## 2020-07-24 ENCOUNTER — Telehealth: Payer: Self-pay | Admitting: Urology

## 2020-07-24 NOTE — Telephone Encounter (Signed)
Mr. Jason Murphy was a no-show to clinic this week.  He is due for PSA, ADT injection.  He was also going to have a voiding trial.  Please try to reach out to him, he only has a home phone number and often travels.  Is contacting alternate is his daughter-in-law.  Hollice Espy, MD

## 2020-07-27 ENCOUNTER — Telehealth: Payer: Self-pay

## 2020-07-27 NOTE — Telephone Encounter (Signed)
Called [employer] no PA required. Ref #[id number]

## 2020-07-27 NOTE — Telephone Encounter (Signed)
Spoke with patient regarding his after hours nurse line call. Patient was concerned with seeing blood in his urine after self cathing. Patient notes blood in urine has since resolved. While I had patient on the phone, I had him confirm his upcoming appointment(s) with Dr Erlene Quan.  Please disregard note below, entered and signed in ERROR.

## 2020-07-27 NOTE — Telephone Encounter (Signed)
Spoke with patient regarding his after

## 2020-07-30 ENCOUNTER — Other Ambulatory Visit: Payer: Self-pay

## 2020-07-30 DIAGNOSIS — C61 Malignant neoplasm of prostate: Secondary | ICD-10-CM

## 2020-08-03 ENCOUNTER — Other Ambulatory Visit: Payer: Self-pay

## 2020-08-03 ENCOUNTER — Other Ambulatory Visit: Payer: Medicare HMO

## 2020-08-03 DIAGNOSIS — C61 Malignant neoplasm of prostate: Secondary | ICD-10-CM

## 2020-08-04 LAB — PSA: Prostate Specific Ag, Serum: 265 ng/mL — ABNORMAL HIGH (ref 0.0–4.0)

## 2020-08-04 LAB — TESTOSTERONE: Testosterone: 6 ng/dL — ABNORMAL LOW (ref 264–916)

## 2020-08-04 NOTE — Progress Notes (Signed)
08/05/2020 3:10 PM   Jason Murphy [birth year] [id number]  Referring provider: Lynnell Jude, MD [address] [place],  [place] [address] Chief Complaint  Patient presents with  . Prostate Cancer    HPI: Jason Murphy is a 86 y.o. male who returns for a 1 month follow up of advanced metastatic prostate cancer, urinary retention, and initiation of Eligard. Patient is accompanied by his daughter in law today.   Whole body scan on 06/03/2020 showed diffuse bone metastases throughout the axial and proximal appendicular skeleton.  CT A/P w/ contrast 06/03/2020 showed enlarged prostate with diffuse enhancement and ill-defined margins, consistent with known prostate carcinoma. Bilateral seminal vesicle involvement noted. Mild left external iliac lymphadenopathy, consistent with metastatic disease. Diffuse sclerotic bone metastases. Increased diffuse bladder wall thickening and trabeculation, most likely due to chronic bladder outlet obstruction given enlarge prostate. Bilateral nephrolithiasis. No evidence of ureteral calculi or Hydronephrosis. Colonic diverticulosis. No radiographic evidence of diverticulitis. Mild hepatic steatosis. 5.0 cm infrarenal abdominal aortic aneurysm.  He was last seen by [name] PA-C on 06/25/2020. Patient reported severe discomfort with CIC in the past that daughter clarifies was secondary to passing an unlubricated catheter into the bladder as well as traumatic self-removal of Foley approximately 2 weeks ago.   Patient refusing CIC teaching at this time and would like to leave Foley in place with plans for voiding trial in 2 weeks to allow additional time for healing following urethral trauma. He was unsure if he will be interested in reattempting CIC at that time if voiding trial was unsuccessful and patient stated he would prefer to have Foley replaced if that occured.  Recent labs on 08/03/2020 show PSA 265, testosterone 6.  He had no issues with  in and out cath. Patient self caths twice daily. He is urinating on his own now but not to completion. He states painful urination.  PSA trend: Component     Latest Ref Rng & Units 04/29/2020 08/03/2020  Prostate Specific Ag, Serum     0.0 - 4.0 ng/mL 2,355.0 (H) 265.0 (H)    PMH: Past Medical History:  Diagnosis Date  . Cancer [facility])    prostate  . Coronary artery disease   . Dizziness    chronic dizziness  . Dyspnea   . Fatigue    Progressive fatigue with question of sleep apnea  . Hypercholesterolemia   . Hypertension   . Obstructive sleep apnea   . Pneumonia   . Restless leg syndrome     Surgical History: Past Surgical History:  Procedure Laterality Date  . APPENDECTOMY    . CARDIAC CATHETERIZATION     Ejection Fraction was 60%, stent  . KNEE SURGERY     arthroscopic left knee surgery  . SHOULDER ARTHROSCOPY      Home Medications:  Allergies as of 08/05/2020   No Known Allergies     Medication List       Accurate as of August 05, 2020 11:59 PM. If you have any questions, ask your nurse or doctor.        AMBULATORY NON FORMULARY MEDICATION Trimix (30/1/10)-(Pap/Phent/PGE)  Test Dose  70ml vial   Qty #3 Refills 0  [facility] [phone] Fax [phone]   aspirin EC 81 MG tablet Take 81 mg by mouth at bedtime.   co-enzyme Q-10 50 MG capsule Take 100 mg by mouth every morning.   finasteride 5 MG tablet Commonly known as: PROSCAR Take by mouth.   isosorbide mononitrate  30 MG 24 hr tablet Commonly known as: IMDUR   lansoprazole 15 MG capsule Commonly known as: PREVACID Take 15-30 mg by mouth See admin instructions. Take 1 capsule by mouth every other day alternating with 2 capsules (30mg ) by mouth every other day.   levothyroxine 50 MCG tablet Commonly known as: SYNTHROID Take 50 mcg by mouth daily before breakfast.   meclizine 25 MG tablet Commonly known as: ANTIVERT Take 25 mg by mouth 2 (two) times daily as needed for  dizziness.   nitroGLYCERIN 0.4 MG SL tablet Commonly known as: NITROSTAT Place 0.4 mg under the tongue every 5 (five) minutes as needed for chest pain.   oxyCODONE-acetaminophen 5-325 MG tablet Commonly known as: PERCOCET/ROXICET Take 1 tablet by mouth every 6 (six) hours as needed for severe pain.   PRESERVISION AREDS 2 PO Take 1 tablet by mouth daily.   rOPINIRole 1 MG tablet Commonly known as: REQUIP Take 1 mg by mouth 3 (three) times daily.   sildenafil 25 MG tablet Commonly known as: VIAGRA Take by mouth.   simvastatin 40 MG tablet Commonly known as: ZOCOR TAKE 1 TABLET BY MOUTH EVERY NIGHT AT BEDTIME   vitamin B-12 500 MCG tablet Commonly known as: CYANOCOBALAMIN Take 500 mcg by mouth every morning.   Vitamin D3 25 MCG (1000 UT) Caps Take 1,000 Units by mouth daily.       Allergies: No Known Allergies  Family History: Family History  Problem Relation Age of Onset  . COPD Mother     Social History:  reports that he has never smoked. He has never used smokeless tobacco. He reports current alcohol use. He reports that he does not use drugs.   Physical Exam: Constitutional:  Alert and oriented, No acute distress.  Accompanied by daughter in law.Appears younger than stated age.  HEENT: [place] AT, moist mucus membranes.  Trachea midline, no masses. Cardiovascular: No clubbing, cyanosis, or edema. Respiratory: Normal respiratory effort, no increased work of breathing. Skin: No rashes, bruises or suspicious lesions. Neurologic: Grossly intact, no focal deficits, moving all 4 extremities. Psychiatric: Normal mood and affect.  Laboratory Data:  Lab Results  Component Value Date   CREATININE 0.88 06/23/2020    Lab Results  Component Value Date   TESTOSTERONE 6 (L) 08/03/2020     Assessment & Plan:    1. Urinary retention Voiding spontaneously and has cut back to self cath twice daily. Encourage patient to post voiding residual with self cath.  Patient's  symptoms will continue to improve as his prostate cancer improves.   2. Advanced metastatic prostate cancer to bone PSA trended down to 265 ng/mL on 08/03/2020 Eligard 6 month 45 mg injection today. Calcium/ vitamin D, weight bearing exercise encouraged Given advanced age, will hold off on androgen receptor blocker oral agent at this time, consider down the road Continue PSA q6 months.  Discussed referral to oncology for consideration of prolia (may not be candidate due to poor dentition) -Ambulatory referral to Oncology.    F/u 6 months  [facility] [address] [place], [place] [address] [phone]  I, Selena Batten, am acting as a scribe for Dr. Hollice Espy.  I have reviewed the above documentation for accuracy and completeness, and I agree with the above.   Hollice Espy, MD

## 2020-08-05 ENCOUNTER — Other Ambulatory Visit: Payer: Self-pay

## 2020-08-05 ENCOUNTER — Ambulatory Visit (INDEPENDENT_AMBULATORY_CARE_PROVIDER_SITE_OTHER): Payer: Medicare HMO | Admitting: Urology

## 2020-08-05 ENCOUNTER — Ambulatory Visit: Payer: Self-pay | Admitting: Urology

## 2020-08-05 DIAGNOSIS — R339 Retention of urine, unspecified: Secondary | ICD-10-CM

## 2020-08-05 DIAGNOSIS — C61 Malignant neoplasm of prostate: Secondary | ICD-10-CM | POA: Diagnosis not present

## 2020-08-05 MED ORDER — LEUPROLIDE ACETATE (6 MONTH) 45 MG ~~LOC~~ KIT
45.0000 mg | PACK | Freq: Once | SUBCUTANEOUS | Status: AC
  Administered 2020-08-05: 45 mg via SUBCUTANEOUS

## 2020-08-13 ENCOUNTER — Inpatient Hospital Stay: Payer: Medicare HMO | Attending: Oncology | Admitting: Oncology

## 2020-08-13 ENCOUNTER — Encounter: Payer: Self-pay | Admitting: Oncology

## 2020-08-13 ENCOUNTER — Other Ambulatory Visit: Payer: Self-pay

## 2020-08-13 ENCOUNTER — Inpatient Hospital Stay: Payer: Medicare HMO

## 2020-08-13 VITALS — BP 116/75 | HR 65 | Temp 97.0°F | Resp 20 | Wt 150.8 lb

## 2020-08-13 DIAGNOSIS — C61 Malignant neoplasm of prostate: Secondary | ICD-10-CM | POA: Insufficient documentation

## 2020-08-13 DIAGNOSIS — C7951 Secondary malignant neoplasm of bone: Secondary | ICD-10-CM

## 2020-08-13 DIAGNOSIS — Z7189 Other specified counseling: Secondary | ICD-10-CM

## 2020-08-13 DIAGNOSIS — Z79899 Other long term (current) drug therapy: Secondary | ICD-10-CM | POA: Insufficient documentation

## 2020-08-15 DIAGNOSIS — Z7189 Other specified counseling: Secondary | ICD-10-CM | POA: Insufficient documentation

## 2020-08-15 NOTE — Progress Notes (Signed)
[name]  [phone] [phone]  ID: Jason Murphy OB: May 14, 1920  MR#: [id number]  UVO#:[id number]  Patient Care Team: Baxter Hire, MD as PCP - General (Internal Medicine) Lloyd Huger, MD as Consulting Physician (Oncology)  CHIEF COMPLAINT: Stage IV prostate cancer.  INTERVAL HISTORY: Patient is a 86 year old male with a history of stage IV prostate cancer with widespread bony metastasis.  He recently received Eligard treatment from urology and was referred for further evaluation and consideration of Zometa for his bony disease.  He currently feels well and is asymptomatic.  He does not complain of pain today.  He has no neurologic complaints.  He denies any recent fevers or illnesses.  He has a good appetite and denies weight loss.  He has no chest pain, shortness of breath, cough, or hemoptysis.  He denies any nausea, vomiting, constipation, or diarrhea.  He has no urinary complaints.  Patient feels at his baseline and offers no specific complaints today.  REVIEW OF SYSTEMS:   Review of Systems  Constitutional: Negative.  Negative for fever, malaise/fatigue and weight loss.  Respiratory: Negative.  Negative for cough, hemoptysis and shortness of breath.   Cardiovascular: Negative.  Negative for chest pain and leg swelling.  Gastrointestinal: Negative.  Negative for abdominal pain.  Genitourinary: Negative.  Negative for dysuria.  Musculoskeletal: Negative.  Negative for back pain.  Skin: Negative.  Negative for rash.  Neurological: Negative.  Negative for dizziness, focal weakness, weakness and headaches.  Psychiatric/Behavioral: Negative.  The patient is not nervous/anxious.     As per HPI. Otherwise, a complete review of systems is negative.  PAST MEDICAL HISTORY: Past Medical History:  Diagnosis Date  . Cancer [facility])    prostate  . Coronary artery disease   . Dizziness    chronic dizziness  . Dyspnea   . Fatigue     Progressive fatigue with question of sleep apnea  . Hypercholesterolemia   . Hypertension   . Obstructive sleep apnea   . Pneumonia   . Restless leg syndrome     PAST SURGICAL HISTORY: Past Surgical History:  Procedure Laterality Date  . APPENDECTOMY    . CARDIAC CATHETERIZATION     Ejection Fraction was 60%, stent  . KNEE SURGERY     arthroscopic left knee surgery  . SHOULDER ARTHROSCOPY      FAMILY HISTORY: Family History  Problem Relation Age of Onset  . COPD Mother     ADVANCED DIRECTIVES (Y/N):  N  HEALTH MAINTENANCE: Social History   Tobacco Use  . Smoking status: Never Smoker  . Smokeless tobacco: Never Used  Vaping Use  . Vaping Use: Never used  Substance Use Topics  . Alcohol use: Yes    Comment: 1/2 glass wine daily  . Drug use: No     Colonoscopy:  PAP:  Bone density:  Lipid panel:  No Known Allergies  Current Outpatient Medications  Medication Sig Dispense Refill  . AMBULATORY NON FORMULARY MEDICATION Trimix (30/1/10)-(Pap/Phent/PGE)  Test Dose  70ml vial   Qty #3 Refills 0  [place] [phone] Fax [phone] 3 Doses/Fill 0  . aspirin EC 81 MG tablet Take 81 mg by mouth at bedtime.    . Cholecalciferol (VITAMIN D3) 1000 units CAPS Take 1,000 Units by mouth daily.    Marland Kitchen co-enzyme Q-10 50 MG capsule Take 100 mg by mouth every morning.     . cyanocobalamin 500 MCG tablet Take 500 mcg by mouth every morning.    Marland Kitchen  finasteride (PROSCAR) 5 MG tablet Take by mouth.    . isosorbide mononitrate (IMDUR) 30 MG 24 hr tablet     . lansoprazole (PREVACID) 15 MG capsule Take 15-30 mg by mouth See admin instructions. Take 1 capsule by mouth every other day alternating with 2 capsules (30mg ) by mouth every other day.    . levothyroxine (SYNTHROID, LEVOTHROID) 50 MCG tablet Take 50 mcg by mouth daily before breakfast.    . meclizine (ANTIVERT) 25 MG tablet Take 25 mg by mouth 2 (two) times daily as needed for dizziness.    . Multiple  Vitamins-Minerals (PRESERVISION AREDS 2 PO) Take 1 tablet by mouth daily.    . nitroGLYCERIN (NITROSTAT) 0.4 MG SL tablet Place 0.4 mg under the tongue every 5 (five) minutes as needed for chest pain.    Marland Kitchen oxyCODONE-acetaminophen (PERCOCET/ROXICET) 5-325 MG tablet Take 1 tablet by mouth every 6 (six) hours as needed for severe pain. 12 tablet 0  . rOPINIRole (REQUIP) 1 MG tablet Take 1 mg by mouth 3 (three) times daily.    . simvastatin (ZOCOR) 40 MG tablet TAKE 1 TABLET BY MOUTH EVERY NIGHT AT BEDTIME 30 tablet 5  . sildenafil (VIAGRA) 25 MG tablet Take by mouth.     No current facility-administered medications for this visit.    OBJECTIVE: Vitals:   08/13/20 1333  BP: 116/75  Pulse: 65  Resp: 20  Temp: (!) 97 F (36.1 C)  SpO2: 100%     Body mass index is 22.27 kg/m.    ECOG FS:0 - Asymptomatic  General: Well-developed, well-nourished, no acute distress. Eyes: Pink conjunctiva, anicteric sclera. HEENT: Normocephalic, moist mucous membranes. Lungs: No audible wheezing or coughing. Heart: Regular rate and rhythm. Abdomen: Soft, nontender, no obvious distention. Musculoskeletal: No edema, cyanosis, or clubbing. Neuro: Alert, answering all questions appropriately. Cranial nerves grossly intact. Skin: No rashes or petechiae noted. Psych: Normal affect. Lymphatics: No cervical, calvicular, axillary or inguinal LAD.   LAB RESULTS:  Lab Results  Component Value Date   NA 140 06/23/2020   K 4.3 06/23/2020   CL 104 06/23/2020   CO2 28 06/23/2020   GLUCOSE 131 (H) 06/23/2020   BUN 16 06/23/2020   CREATININE 0.88 06/23/2020   CALCIUM 8.5 (L) 06/23/2020   PROT 6.7 06/23/2020   ALBUMIN 3.5 06/23/2020   AST 30 06/23/2020   ALT 13 06/23/2020   ALKPHOS 168 (H) 06/23/2020   BILITOT 0.9 06/23/2020   GFRNONAA >60 06/23/2020   GFRAA >60 06/23/2020    Lab Results  Component Value Date   WBC 7.9 06/23/2020   NEUTROABS 5.3 06/23/2020   HGB 12.2 (L) 06/23/2020   HCT 35.9 (L)  06/23/2020   MCV 95.0 06/23/2020   PLT 362 06/23/2020     STUDIES: No results found.  ASSESSMENT: Stage IV prostate cancer.  PLAN:    1.  Stage IV prostate cancer: Patient's most recent PSA reported to 62 which is down significantly from July 2021 where his reported PSA was greater than 2000.  He initiated Eligard on August 05, 2020.  Given his bony mets, he will benefit from Zometa every 6 months as well.  (Prolia is not covered by insurance) return to clinic next week for laboratory work and Zometa.  Patient will then return to clinic in 6 months for further evaluation and continuation of treatment.  Plan to discuss with urology if patient should receive his Eligard concurrently with Zometa. 2.  Urinary retention: Continue in and out cath as per  urology.  I spent a total of 60 minutes reviewing chart data, face-to-face evaluation with the patient, counseling and coordination of care as detailed above.   Patient expressed understanding and was in agreement with this plan. He also understands that He can call clinic at any time with any questions, concerns, or complaints.   Cancer Staging PROSTATE CANCER Staging form: Prostate, AJCC 7th Edition - Clinical: Stage IV (TX, NX, M1b, PSA: 20 or greater) - Signed by Lloyd Huger, MD on 08/15/2020   Lloyd Huger, MD   08/15/2020 8:20 AM

## 2020-08-20 ENCOUNTER — Ambulatory Visit: Payer: Medicare HMO

## 2020-08-20 ENCOUNTER — Other Ambulatory Visit: Payer: Self-pay

## 2020-08-20 ENCOUNTER — Inpatient Hospital Stay: Payer: Medicare HMO

## 2020-08-20 ENCOUNTER — Inpatient Hospital Stay: Payer: Medicare HMO | Attending: Oncology

## 2020-08-20 VITALS — BP 121/65 | HR 61 | Temp 96.5°F | Resp 20

## 2020-08-20 DIAGNOSIS — C61 Malignant neoplasm of prostate: Secondary | ICD-10-CM

## 2020-08-20 DIAGNOSIS — C7951 Secondary malignant neoplasm of bone: Secondary | ICD-10-CM | POA: Insufficient documentation

## 2020-08-20 DIAGNOSIS — R339 Retention of urine, unspecified: Secondary | ICD-10-CM | POA: Diagnosis not present

## 2020-08-20 LAB — BASIC METABOLIC PANEL
Anion gap: 7 (ref 5–15)
BUN: 16 mg/dL (ref 8–23)
CO2: 27 mmol/L (ref 22–32)
Calcium: 8.7 mg/dL — ABNORMAL LOW (ref 8.9–10.3)
Chloride: 105 mmol/L (ref 98–111)
Creatinine, Ser: 0.89 mg/dL (ref 0.61–1.24)
GFR, Estimated: >60 mL/min (ref >60)
Glucose, Bld: 128 mg/dL — ABNORMAL HIGH (ref 70–99)
Potassium: 4.4 mmol/L (ref 3.5–5.1)
Sodium: 139 mmol/L (ref 135–145)

## 2020-08-20 LAB — PSA: Prostatic Specific Antigen: 280 ng/mL — ABNORMAL HIGH (ref 0.00–4.00)

## 2020-08-20 MED ORDER — SODIUM CHLORIDE 0.9 % IV SOLN
Freq: Once | INTRAVENOUS | Status: AC
  Filled 2020-08-20: qty 250

## 2020-08-20 MED ORDER — ZOLEDRONIC ACID 4 MG/5ML IV CONC
3.0000 mg | Freq: Once | INTRAVENOUS | Status: AC
  Administered 2020-08-20: 3 mg via INTRAVENOUS
  Filled 2020-08-20: qty 3.75

## 2020-08-20 NOTE — Addendum Note (Signed)
Addended by: Ruthell Rummage A on: 08/20/2020 09:30 AM   Modules accepted: Orders

## 2020-08-20 NOTE — Progress Notes (Signed)
1450- Patient tolerated Zometa infusion well. Patient discharged to home at this time.

## 2020-09-09 DIAGNOSIS — H0288B Meibomian gland dysfunction left eye, upper and lower eyelids: Secondary | ICD-10-CM | POA: Diagnosis not present

## 2020-09-09 DIAGNOSIS — Z961 Presence of intraocular lens: Secondary | ICD-10-CM | POA: Diagnosis not present

## 2020-09-09 DIAGNOSIS — H04123 Dry eye syndrome of bilateral lacrimal glands: Secondary | ICD-10-CM | POA: Diagnosis not present

## 2020-09-09 DIAGNOSIS — H0288A Meibomian gland dysfunction right eye, upper and lower eyelids: Secondary | ICD-10-CM | POA: Diagnosis not present

## 2020-10-06 ENCOUNTER — Emergency Department: Payer: Medicare HMO

## 2020-10-06 ENCOUNTER — Other Ambulatory Visit: Payer: Self-pay

## 2020-10-06 ENCOUNTER — Inpatient Hospital Stay
Admission: EM | Admit: 2020-10-06 | Discharge: 2020-10-09 | DRG: 872 | Disposition: A | Discharge status: Home Health | Payer: Medicare HMO | Attending: Internal Medicine | Admitting: Internal Medicine

## 2020-10-06 DIAGNOSIS — R338 Other retention of urine: Secondary | ICD-10-CM | POA: Diagnosis present

## 2020-10-06 DIAGNOSIS — G4733 Obstructive sleep apnea (adult) (pediatric): Secondary | ICD-10-CM | POA: Diagnosis present

## 2020-10-06 DIAGNOSIS — C799 Secondary malignant neoplasm of unspecified site: Secondary | ICD-10-CM | POA: Diagnosis not present

## 2020-10-06 DIAGNOSIS — G9340 Encephalopathy, unspecified: Secondary | ICD-10-CM | POA: Diagnosis not present

## 2020-10-06 DIAGNOSIS — G2581 Restless legs syndrome: Secondary | ICD-10-CM | POA: Diagnosis present

## 2020-10-06 DIAGNOSIS — R55 Syncope and collapse: Secondary | ICD-10-CM

## 2020-10-06 DIAGNOSIS — I447 Left bundle-branch block, unspecified: Secondary | ICD-10-CM | POA: Diagnosis present

## 2020-10-06 DIAGNOSIS — Z79899 Other long term (current) drug therapy: Secondary | ICD-10-CM | POA: Diagnosis not present

## 2020-10-06 DIAGNOSIS — R6520 Severe sepsis without septic shock: Secondary | ICD-10-CM | POA: Diagnosis present

## 2020-10-06 DIAGNOSIS — N390 Urinary tract infection, site not specified: Secondary | ICD-10-CM | POA: Diagnosis not present

## 2020-10-06 DIAGNOSIS — Z79890 Hormone replacement therapy (postmenopausal): Secondary | ICD-10-CM

## 2020-10-06 DIAGNOSIS — I480 Paroxysmal atrial fibrillation: Secondary | ICD-10-CM | POA: Diagnosis not present

## 2020-10-06 DIAGNOSIS — E782 Mixed hyperlipidemia: Secondary | ICD-10-CM | POA: Diagnosis present

## 2020-10-06 DIAGNOSIS — Z7982 Long term (current) use of aspirin: Secondary | ICD-10-CM | POA: Diagnosis not present

## 2020-10-06 DIAGNOSIS — R0902 Hypoxemia: Secondary | ICD-10-CM

## 2020-10-06 DIAGNOSIS — N3000 Acute cystitis without hematuria: Secondary | ICD-10-CM

## 2020-10-06 DIAGNOSIS — D65 Disseminated intravascular coagulation [defibrination syndrome]: Secondary | ICD-10-CM | POA: Diagnosis not present

## 2020-10-06 DIAGNOSIS — I2510 Atherosclerotic heart disease of native coronary artery without angina pectoris: Secondary | ICD-10-CM | POA: Diagnosis present

## 2020-10-06 DIAGNOSIS — A419 Sepsis, unspecified organism: Secondary | ICD-10-CM | POA: Diagnosis not present

## 2020-10-06 DIAGNOSIS — Z8701 Personal history of pneumonia (recurrent): Secondary | ICD-10-CM | POA: Diagnosis not present

## 2020-10-06 DIAGNOSIS — R531 Weakness: Secondary | ICD-10-CM | POA: Diagnosis not present

## 2020-10-06 DIAGNOSIS — R197 Diarrhea, unspecified: Secondary | ICD-10-CM | POA: Diagnosis present

## 2020-10-06 DIAGNOSIS — I10 Essential (primary) hypertension: Secondary | ICD-10-CM | POA: Diagnosis not present

## 2020-10-06 DIAGNOSIS — Z20822 Contact with and (suspected) exposure to covid-19: Secondary | ICD-10-CM | POA: Diagnosis present

## 2020-10-06 DIAGNOSIS — C61 Malignant neoplasm of prostate: Secondary | ICD-10-CM | POA: Diagnosis not present

## 2020-10-06 DIAGNOSIS — J90 Pleural effusion, not elsewhere classified: Secondary | ICD-10-CM | POA: Diagnosis not present

## 2020-10-06 DIAGNOSIS — N401 Enlarged prostate with lower urinary tract symptoms: Secondary | ICD-10-CM | POA: Diagnosis present

## 2020-10-06 DIAGNOSIS — R059 Cough, unspecified: Secondary | ICD-10-CM | POA: Diagnosis not present

## 2020-10-06 DIAGNOSIS — I959 Hypotension, unspecified: Secondary | ICD-10-CM | POA: Diagnosis not present

## 2020-10-06 LAB — CBC WITH DIFFERENTIAL/PLATELET
Abs Immature Granulocytes: 0.02 10*3/uL (ref 0.00–0.07)
Basophils Absolute: 0 10*3/uL (ref 0.0–0.1)
Basophils Relative: 1 %
Eosinophils Absolute: 0.2 10*3/uL (ref 0.0–0.5)
Eosinophils Relative: 3 %
HCT: 37.4 % — ABNORMAL LOW (ref 39.0–52.0)
Hemoglobin: 11.8 g/dL — ABNORMAL LOW (ref 13.0–17.0)
Immature Granulocytes: 0 %
Lymphocytes Relative: 27 %
Lymphs Abs: 2 10*3/uL (ref 0.7–4.0)
MCH: 31 pg (ref 26.0–34.0)
MCHC: 31.6 g/dL (ref 30.0–36.0)
MCV: 98.2 fL (ref 80.0–100.0)
Monocytes Absolute: 1 10*3/uL (ref 0.1–1.0)
Monocytes Relative: 13 %
Neutro Abs: 4.3 10*3/uL (ref 1.7–7.7)
Neutrophils Relative %: 56 %
Platelets: 280 10*3/uL (ref 150–400)
RBC: 3.81 MIL/uL — ABNORMAL LOW (ref 4.22–5.81)
RDW: 14.4 % (ref 11.5–15.5)
WBC: 7.5 10*3/uL (ref 4.0–10.5)
nRBC: 0 % (ref 0.0–0.2)

## 2020-10-06 LAB — COMPREHENSIVE METABOLIC PANEL
ALT: 11 U/L (ref 0–44)
AST: 29 U/L (ref 15–41)
Albumin: 3.4 g/dL — ABNORMAL LOW (ref 3.5–5.0)
Alkaline Phosphatase: 168 U/L — ABNORMAL HIGH (ref 38–126)
Anion gap: 9 (ref 5–15)
BUN: 22 mg/dL (ref 8–23)
CO2: 24 mmol/L (ref 22–32)
Calcium: 8.5 mg/dL — ABNORMAL LOW (ref 8.9–10.3)
Chloride: 107 mmol/L (ref 98–111)
Creatinine, Ser: 0.99 mg/dL (ref 0.61–1.24)
GFR, Estimated: >60 mL/min (ref >60)
Glucose, Bld: 121 mg/dL — ABNORMAL HIGH (ref 70–99)
Potassium: 3.7 mmol/L (ref 3.5–5.1)
Sodium: 140 mmol/L (ref 135–145)
Total Bilirubin: 1 mg/dL (ref 0.3–1.2)
Total Protein: 6.6 g/dL (ref 6.5–8.1)

## 2020-10-06 LAB — TROPONIN I (HIGH SENSITIVITY)
Troponin I (High Sensitivity): 9 ng/L (ref <18)
Troponin I (High Sensitivity): 8 ng/L (ref <18)

## 2020-10-06 LAB — LACTIC ACID, PLASMA
Lactic Acid, Venous: 2.1 mmol/L (ref 0.5–1.9)
Lactic Acid, Venous: 2.1 mmol/L (ref 0.5–1.9)

## 2020-10-06 LAB — URINALYSIS, COMPLETE (UACMP) WITH MICROSCOPIC
Bilirubin Urine: NEGATIVE
Glucose, UA: NEGATIVE mg/dL
Ketones, ur: NEGATIVE mg/dL
Nitrite: NEGATIVE
Protein, ur: 30 mg/dL — AB
Specific Gravity, Urine: 1.011 (ref 1.005–1.030)
Squamous Epithelial / HPF: NONE SEEN (ref 0–5)
WBC, UA: >50 WBC/hpf — ABNORMAL HIGH (ref 0–5)
pH: 6 (ref 5.0–8.0)

## 2020-10-06 LAB — RESP PANEL BY RT-PCR (FLU A&B, COVID) ARPGX2
Influenza A by PCR: NEGATIVE
Influenza B by PCR: NEGATIVE
SARS Coronavirus 2 by RT PCR: NEGATIVE

## 2020-10-06 LAB — TSH: TSH: 1.284 u[IU]/mL (ref 0.350–4.500)

## 2020-10-06 LAB — PROTIME-INR
INR: 1.2 (ref 0.8–1.2)
Prothrombin Time: 14.8 seconds (ref 11.4–15.2)

## 2020-10-06 LAB — LIPASE, BLOOD: Lipase: 26 U/L (ref 11–51)

## 2020-10-06 MED ORDER — ENOXAPARIN SODIUM 40 MG/0.4ML ~~LOC~~ SOLN
40.0000 mg | SUBCUTANEOUS | Status: DC
  Administered 2020-10-06 – 2020-10-08 (×3): 40 mg via SUBCUTANEOUS
  Filled 2020-10-06 (×3): qty 0.4

## 2020-10-06 MED ORDER — LACTATED RINGERS IV SOLN: INTRAVENOUS | Status: AC

## 2020-10-06 MED ORDER — VITAMIN D 25 MCG (1000 UNIT) PO TABS
1000.0000 [IU] | ORAL_TABLET | Freq: Every day | ORAL | Status: DC
  Administered 2020-10-07 – 2020-10-09 (×3): 1000 [IU] via ORAL
  Filled 2020-10-06 (×3): qty 1

## 2020-10-06 MED ORDER — ROPINIROLE HCL 1 MG PO TABS
1.0000 mg | ORAL_TABLET | Freq: Three times a day (TID) | ORAL | Status: DC
  Administered 2020-10-06 – 2020-10-09 (×8): 1 mg via ORAL
  Filled 2020-10-06 (×8): qty 1

## 2020-10-06 MED ORDER — ACETAMINOPHEN 325 MG PO TABS
650.0000 mg | ORAL_TABLET | Freq: Four times a day (QID) | ORAL | Status: DC | PRN
  Administered 2020-10-06: 650 mg via ORAL
  Filled 2020-10-06: qty 2

## 2020-10-06 MED ORDER — SODIUM CHLORIDE 0.9 % IV SOLN
1.0000 g | INTRAVENOUS | Status: DC
  Administered 2020-10-06 – 2020-10-08 (×3): 1 g via INTRAVENOUS
  Filled 2020-10-06 (×4): qty 10

## 2020-10-06 MED ORDER — SODIUM CHLORIDE 0.9 % IV BOLUS (SEPSIS)
1000.0000 mL | Freq: Once | INTRAVENOUS | Status: AC
  Administered 2020-10-06: 15:00:00 1000 mL via INTRAVENOUS

## 2020-10-06 MED ORDER — OXYCODONE-ACETAMINOPHEN 5-325 MG PO TABS: 1.0000 | ORAL_TABLET | Freq: Four times a day (QID) | ORAL | Status: DC | PRN

## 2020-10-06 MED ORDER — OCUVITE-LUTEIN PO CAPS
1.0000 | ORAL_CAPSULE | Freq: Every day | ORAL | Status: DC
  Administered 2020-10-07 – 2020-10-09 (×3): 1 via ORAL
  Filled 2020-10-06 (×3): qty 1

## 2020-10-06 MED ORDER — FINASTERIDE 5 MG PO TABS
5.0000 mg | ORAL_TABLET | Freq: Every day | ORAL | Status: DC
  Administered 2020-10-08 – 2020-10-09 (×2): 5 mg via ORAL
  Filled 2020-10-06 (×4): qty 1

## 2020-10-06 MED ORDER — PRESERVISION AREDS 2 PO CAPS: ORAL_CAPSULE | Freq: Every day | ORAL | Status: DC

## 2020-10-06 MED ORDER — MECLIZINE HCL 25 MG PO TABS
25.0000 mg | ORAL_TABLET | Freq: Two times a day (BID) | ORAL | Status: DC | PRN
  Filled 2020-10-06: qty 1

## 2020-10-06 MED ORDER — CO-ENZYME Q-10 50 MG PO CAPS: 100.0000 mg | ORAL_CAPSULE | ORAL | Status: DC

## 2020-10-06 MED ORDER — ISOSORBIDE MONONITRATE ER 30 MG PO TB24
30.0000 mg | ORAL_TABLET | Freq: Every day | ORAL | Status: DC
  Administered 2020-10-07 – 2020-10-09 (×3): 30 mg via ORAL
  Filled 2020-10-06 (×3): qty 1

## 2020-10-06 MED ORDER — PANTOPRAZOLE SODIUM 20 MG PO TBEC
20.0000 mg | DELAYED_RELEASE_TABLET | Freq: Every day | ORAL | Status: DC
  Administered 2020-10-07 – 2020-10-09 (×3): 20 mg via ORAL
  Filled 2020-10-06 (×3): qty 1

## 2020-10-06 MED ORDER — ACETAMINOPHEN 650 MG RE SUPP: 650.0000 mg | Freq: Four times a day (QID) | RECTAL | Status: DC | PRN

## 2020-10-06 MED ORDER — SODIUM CHLORIDE 0.9 % IV BOLUS (SEPSIS)
250.0000 mL | Freq: Once | INTRAVENOUS | Status: AC
  Administered 2020-10-06: 17:00:00 250 mL via INTRAVENOUS

## 2020-10-06 MED ORDER — ASPIRIN EC 81 MG PO TBEC
81.0000 mg | DELAYED_RELEASE_TABLET | Freq: Every day | ORAL | Status: DC
  Administered 2020-10-06 – 2020-10-08 (×3): 81 mg via ORAL
  Filled 2020-10-06 (×3): qty 1

## 2020-10-06 MED ORDER — SODIUM CHLORIDE 0.9% FLUSH
3.0000 mL | Freq: Two times a day (BID) | INTRAVENOUS | Status: DC
  Administered 2020-10-07 – 2020-10-09 (×4): 3 mL via INTRAVENOUS

## 2020-10-06 MED ORDER — SIMVASTATIN 20 MG PO TABS
40.0000 mg | ORAL_TABLET | Freq: Every day | ORAL | Status: DC
  Administered 2020-10-06 – 2020-10-08 (×3): 40 mg via ORAL
  Filled 2020-10-06 (×2): qty 4
  Filled 2020-10-06: qty 2
  Filled 2020-10-06 (×2): qty 4

## 2020-10-06 MED ORDER — LEVOTHYROXINE SODIUM 50 MCG PO TABS
50.0000 ug | ORAL_TABLET | Freq: Every day | ORAL | Status: DC
  Administered 2020-10-07 – 2020-10-09 (×3): 50 ug via ORAL
  Filled 2020-10-06 (×4): qty 1

## 2020-10-06 MED ORDER — VITAMIN B-12 1000 MCG PO TABS
500.0000 ug | ORAL_TABLET | ORAL | Status: DC
  Administered 2020-10-07 – 2020-10-09 (×3): 500 ug via ORAL
  Filled 2020-10-06 (×3): qty 1

## 2020-10-06 MED ORDER — SODIUM CHLORIDE 0.9 % IV BOLUS
1000.0000 mL | Freq: Once | INTRAVENOUS | Status: AC
  Administered 2020-10-06: 10:00:00 1000 mL via INTRAVENOUS

## 2020-10-06 MED ORDER — SODIUM CHLORIDE 0.9 % IV BOLUS (SEPSIS)
1000.0000 mL | Freq: Once | INTRAVENOUS | Status: AC
  Administered 2020-10-06: 14:00:00 1000 mL via INTRAVENOUS

## 2020-10-06 NOTE — Consult Note (Signed)
CODE SEPSIS - PHARMACY COMMUNICATION  **Broad Spectrum Antibiotics should be administered within 1 hour of Sepsis diagnosis**  Time Code Sepsis Called/Page Received: 1402  Antibiotics Ordered: ceftriaxone  Time of 1st antibiotic administration: 1500  Additional action taken by pharmacy: messaged RN.   If necessary, Name of Provider/Nurse Contacted: Tammy    Oswald Hillock ,PharmD Clinical Pharmacist  10/06/2020  3:28 PM

## 2020-10-06 NOTE — ED Notes (Signed)
Lab at bedside drawing blood.

## 2020-10-06 NOTE — ED Notes (Signed)
Pt able to urinate into urinal. Pt agreeable to allow for post-void reading before self-cathing before bed. Urine sample sent to lab.

## 2020-10-06 NOTE — H&P (Signed)
History and Physical    Jason Murphy [id number] DOB: Mar 10, 1920 DOA: 10/06/2020  PCP: Jason Hire, MD (Confirm with patient/family/NH records and if not entered, this has to be entered at [facility] point of entry) Patient coming from: NH  I have personally briefly reviewed patient's old medical records in [place]  Chief Complaint: I passed out  HPI: Jason Murphy is a 86 y.o. male with medical history significant of metastatic prostate cancer on hormone therapy (status post 2 doses, next dose March 2022), paroxysmal A. fib on aspirin, BPH with chronic urinary retention (patient does self catheterization every night before bedtime), HTN, chronic ambulatory dysfunction, presented with syncope.  Episode happened this morning before breakfast around the clock, patient was sitting at the table, all of a sudden passed out, denied any prodrome of feeling lightheaded palpitations sweating.  Patient was seen slumped on stretcher became unresponsive General no of consciousness poor 5 minutes.  Patient was confused afterwards but he was picked up in the ambulance, EMS also reported patient appeared to had lost control of bowel movement and stained his underwear.  Patient denied any pain, no fever chills, no headache, no palpitations and denied numbness weakness or admit to limbs.  Recently patient has been experiencing frequent episodes of feeling lightheadedness in the morning usually between 10 and 11 AM, during which he feels lightheaded, palpitations and tachypnea and he had to retire him to bed rest for 2 to 3 hours.  ED Course: Blood pressure on lower side, HR lower 60s, received 1.5 L IV boluses and BP recovered.  UA compatible with UTI.  WBC count and creatinine level within normal limits.  Review of Systems: As per HPI otherwise 10 point review of systems negative.    Past Medical History:  Diagnosis Date  . Cancer [facility])    prostate  . Coronary artery disease   . Dizziness     chronic dizziness  . Dyspnea   . Fatigue    Progressive fatigue with question of sleep apnea  . Hypercholesterolemia   . Hypertension   . Obstructive sleep apnea   . Pneumonia   . Restless leg syndrome     Past Surgical History:  Procedure Laterality Date  . APPENDECTOMY    . CARDIAC CATHETERIZATION     Ejection Fraction was 60%, stent  . KNEE SURGERY     arthroscopic left knee surgery  . SHOULDER ARTHROSCOPY       reports that he has never smoked. He has never used smokeless tobacco. He reports current alcohol use. He reports that he does not use drugs.  No Known Allergies  Family History  Problem Relation Age of Onset  . COPD Mother      Prior to Admission medications   Medication Sig Start Date End Date Taking? Authorizing Provider  AMBULATORY NON FORMULARY MEDICATION Trimix (30/1/10)-(Pap/Phent/PGE)  Test Dose  50ml vial   Qty #3 [place] [phone] Fax [phone] 04/29/20   Hollice Espy, MD  aspirin EC 81 MG tablet Take 81 mg by mouth at bedtime.    [provider]  Cholecalciferol (VITAMIN D3) 1000 units CAPS Take 1,000 Units by mouth daily.    [provider]  co-enzyme Q-10 50 MG capsule Take 100 mg by mouth every morning.     [provider]  cyanocobalamin 500 MCG tablet Take 500 mcg by mouth every morning.    [provider]  finasteride (PROSCAR) 5 MG tablet Take  by mouth. 04/17/20 04/17/21  [provider]  isosorbide mononitrate (IMDUR) 30 MG 24 hr tablet  04/09/20   [provider]  lansoprazole (PREVACID) 15 MG capsule Take 15-30 mg by mouth See admin instructions. Take 1 capsule by mouth every other day alternating with 2 capsules (30mg ) by mouth every other day.    [provider]  levothyroxine (SYNTHROID, LEVOTHROID) 50 MCG tablet Take 50 mcg by mouth daily before breakfast.    [provider]  meclizine (ANTIVERT) 25 MG tablet Take 25 mg by mouth 2  (two) times daily as needed for dizziness.    [provider]  Multiple Vitamins-Minerals (PRESERVISION AREDS 2 PO) Take 1 tablet by mouth daily.    [provider]  nitroGLYCERIN (NITROSTAT) 0.4 MG SL tablet Place 0.4 mg under the tongue every 5 (five) minutes as needed for chest pain.    [provider]  oxyCODONE-acetaminophen (PERCOCET/ROXICET) 5-325 MG tablet Take 1 tablet by mouth every 6 (six) hours as needed for severe pain. 06/13/20   Cuthriell, Jason Bills, PA-C  rOPINIRole (REQUIP) 1 MG tablet Take 1 mg by mouth 3 (three) times daily. 03/10/20   [provider]  sildenafil (VIAGRA) 25 MG tablet Take by mouth. 04/17/20 05/17/20  [provider]  simvastatin (ZOCOR) 40 MG tablet TAKE 1 TABLET BY MOUTH EVERY NIGHT AT BEDTIME 05/26/11   Murphy, Jason M, MD  pramipexole (MIRAPEX) 0.5 MG tablet Take 0.5 mg by mouth at bedtime. 09/18/17 03/15/20  [provider]    Physical Exam: Vitals:   10/06/20 1130 10/06/20 1200 10/06/20 1230 10/06/20 1300  BP: (!) 108/59 (!) 98/59 (!) 101/57 (!) 99/50  Pulse: 63 68 60 (!) 59  Resp: (!) 22 15 19 17   Temp:      TempSrc:      SpO2: 95% 100% 98% 93%  Weight:      Height:        Constitutional: NAD, calm, comfortable Vitals:   10/06/20 1130 10/06/20 1200 10/06/20 1230 10/06/20 1300  BP: (!) 108/59 (!) 98/59 (!) 101/57 (!) 99/50  Pulse: 63 68 60 (!) 59  Resp: (!) 22 15 19 17   Temp:      TempSrc:      SpO2: 95% 100% 98% 93%  Weight:      Height:       Eyes: PERRL, lids and conjunctivae normal ENMT: Mucous membranes are moist. Posterior pharynx clear of any exudate or lesions.Normal dentition.  Neck: normal, supple, no masses, no thyromegaly Respiratory: clear to auscultation bilaterally, no wheezing, no crackles. Normal respiratory effort. No accessory muscle use.  Cardiovascular: Regular rate and rhythm, muffled heart sounds but no murmurs. No extremity edema. 2+ pedal pulses. No carotid bruits.   Abdomen: no tenderness, no masses palpated. No hepatosplenomegaly. Bowel sounds positive.  Musculoskeletal: no clubbing / cyanosis. No joint deformity upper and lower extremities. Good ROM, no contractures. Normal muscle tone.  Skin: no rashes, lesions, ulcers. No induration Neurologic: CN 2-12 grossly intact. Sensation intact, DTR normal. Strength 5/5 in all 4.  Psychiatric: Normal judgment and insight. Alert and oriented x 3. Normal mood.     Labs on Admission: I have personally reviewed following labs and imaging studies  CBC: Recent Labs  Lab 10/06/20 0928  WBC 7.5  NEUTROABS 4.3  HGB 11.8*  HCT 37.4*  MCV 98.2  PLT 269   Basic Metabolic Panel: Recent Labs  Lab 10/06/20 0928  NA 140  K 3.7  CL 107  CO2 24  GLUCOSE 121*  BUN 22  CREATININE 0.99  CALCIUM 8.5*   GFR: Estimated Creatinine Clearance: 40.1 mL/min (by C-G formula based on SCr of 0.99 mg/dL). Liver Function Tests: Recent Labs  Lab 10/06/20 0928  AST 29  ALT 11  ALKPHOS 168*  BILITOT 1.0  PROT 6.6  ALBUMIN 3.4*   Recent Labs  Lab 10/06/20 0928  LIPASE 26   No results for input(s): AMMONIA in the last 168 hours. Coagulation Profile: Recent Labs  Lab 10/06/20 0928  INR 1.2   Cardiac Enzymes: No results for input(s): CKTOTAL, CKMB, CKMBINDEX, TROPONINI in the last 168 hours. BNP (last 3 results) No results for input(s): PROBNP in the last 8760 hours. HbA1C: No results for input(s): HGBA1C in the last 72 hours. CBG: No results for input(s): GLUCAP in the last 168 hours. Lipid Profile: No results for input(s): CHOL, HDL, LDLCALC, TRIG, CHOLHDL, LDLDIRECT in the last 72 hours. Thyroid Function Tests: No results for input(s): TSH, T4TOTAL, FREET4, T3FREE, THYROIDAB in the last 72 hours. Anemia Panel: No results for input(s): VITAMINB12, FOLATE, FERRITIN, TIBC, IRON, RETICCTPCT in the last 72 hours. Urine analysis:    Component Value Date/Time   COLORURINE YELLOW (A) 10/06/2020 0929    APPEARANCEUR TURBID (A) 10/06/2020 0929   APPEARANCEUR Cloudy (A) 06/12/2020 1143   LABSPEC 1.011 10/06/2020 0929   LABSPEC 1.015 01/02/2012 2312   PHURINE 6.0 10/06/2020 0929   GLUCOSEU NEGATIVE 10/06/2020 0929   GLUCOSEU Negative 01/02/2012 2312   HGBUR SMALL (A) 10/06/2020 0929   BILIRUBINUR NEGATIVE 10/06/2020 0929   BILIRUBINUR Negative 06/12/2020 1143   BILIRUBINUR Negative 01/02/2012 2312   KETONESUR NEGATIVE 10/06/2020 0929   PROTEINUR 30 (A) 10/06/2020 0929   NITRITE NEGATIVE 10/06/2020 0929   LEUKOCYTESUR LARGE (A) 10/06/2020 0929   LEUKOCYTESUR Negative 01/02/2012 2312    Radiological Exams on Admission: DG Chest Port 1 View  Result Date: 10/06/2020 CLINICAL DATA:  Cough.  Weakness.  Syncope. EXAM: PORTABLE CHEST 1 VIEW COMPARISON:  06/23/2020.  Bone scan 8 18 2021.  CT 06/03/2020. FINDINGS: Mediastinum and hilar structures normal. Heart size normal. Prominent skin folds noted on the right. Mild infiltrate right mid lung cannot be excluded. No pleural effusion or pneumothorax. Multiple blastic bony densities are again noted throughout the chest wall consistent with metastatic disease. IMPRESSION: 1. Prominent skin folds noted on the right. Mild infiltrate right mid lung cannot be excluded. 2. Multiple blastic bony densities again noted throughout the chest wall consistent with metastatic disease. Electronically Signed   By: Marcello Moores  Register   On: 10/06/2020 09:21    EKG: Independently reviewed.  Chronic LBBB  Assessment/Plan Active Problems:   Sepsis ([place])  (please populate well all problems here in Problem List. (For example, if patient is on BP meds at home and you resume or decide to hold them, it is a problem that needs to be her. Same for CAD, COPD, HLD and so on)  Sepsis -Evidenced by hypotension, change of mentation, source is considered to be a UTI. -Ceftriaxone -PVR  Syncope -Probably related to sepsis/hypotension. -Arrhythmia/bradycardia cannot be ruled out  at this point.  Patient has longstanding history of PAF, not on any rate control medications.  Telemetry monitoring and echo ordered (patient had a stress test and outpatient echo with [facility] in March 2021 showed LVEF 57%, but echocardiogram showed LVEF 45%?  Moderate mitral regurgitation) -Orthostatic vital signs and PT evaluation.  PAF -As above  Prostate CA -Outpatient follow-up with oncology for hormone  therapy  BPH with chronic urinary retention -Patient used to to self cath every night at home,  -ordered "Self cath HS" via nursing communication  LBBB -If repeat echo shows depressed LVEF, consider consult cardiology for evaluation for cardiac resynchronization therapy.  DVT prophylaxis: Lovenox  code Status: Full (both patient and his daughter confirmed) Family Communication: Daughter at bedside Disposition Plan: Expect more than 2 midnight hospital stay to treat urosepsis and syncope work-up Consults called: None Admission status: Progressive care, expect downgrade within 24 hours   Lequita Halt MD [facility] Pager [phone]  10/06/2020, 3:02 PM

## 2020-10-06 NOTE — Progress Notes (Signed)
PHARMACIST - PHYSICIAN COMMUNICATION  CONCERNING:  Enoxaparin (Lovenox) for DVT Prophylaxis    RECOMMENDATION: Patient was prescribed enoxaparin 30 mg q24 hours for VTE prophylaxis.   Filed Weights   10/06/20 0919  Weight: 68 kg (150 lb)    Body mass index is 21.52 kg/m.  Estimated Creatinine Clearance: 40.1 mL/min (by C-G formula based on SCr of 0.99 mg/dL).   Patient is candidate for enoxaparin 40mg  every 24 hours based on CrCl >58ml/min or Weight >45kg  DESCRIPTION: Pharmacy has adjusted enoxaparin dose per [facility] policy.  Patient is now receiving enoxaparin 40 mg every 24 hours   Benita Gutter 10/06/2020 6:16 PM

## 2020-10-06 NOTE — ED Notes (Signed)
Lights dimmed per pt request. Remote for tv given directly to pt as requested. Curtain opened per pt request. Bed locked low. Rails up. Call bell within reach.

## 2020-10-06 NOTE — ED Notes (Signed)
Pt repositioned in stretcher as had slid down. Placed on 2L O2 via [place] as sustained RA 89-91%. Pulse ox temporarily removed per pt request as he will self cath soon.

## 2020-10-06 NOTE — ED Notes (Signed)
Introduced self to pt. Pt offered equipment to self-cath as needed. Pt currently refusing. Pt educated about call bell. Call bell within reach. Rail up. Bed locked low.

## 2020-10-06 NOTE — ED Triage Notes (Signed)
Pt brought in from [facility] via EMS.  States pt had a syncopal episode during breakfast.  Pt denies falling, denies head injury.  EMS states that facility reported that he went in and out of consciousness a few times.

## 2020-10-06 NOTE — ED Provider Notes (Signed)
[facility] Emergency Department Provider Note   ____________________________________________   Event Date/Time   First MD Initiated Contact with Patient 10/06/20 0901     (approximate)  I have reviewed the triage vital signs and the nursing notes.   HISTORY  Chief Complaint Loss of Consciousness (syncope)    HPI Jason Murphy is a 86 y.o. male with a past medical history of CAD, hypertension, and obstructive sleep apnea who presents via EMS from his long-term care facility, [facility], with concerns by staff the patient had a syncopal episode during breakfast.  Patient denies falling or head injury.  EMS states the facility reported he was "in and out of consciousness a few times" but never fell out of his chair while he was eating, did not stop breathing, and had good color throughout these events.  Patient denies any complaints at this time other than "I need to use the bathroom".  Patient does note diarrhea over the last couple days.         Past Medical History:  Diagnosis Date  . Cancer [facility])    prostate  . Coronary artery disease   . Dizziness    chronic dizziness  . Dyspnea   . Fatigue    Progressive fatigue with question of sleep apnea  . Hypercholesterolemia   . Hypertension   . Obstructive sleep apnea   . Pneumonia   . Restless leg syndrome     Patient Active Problem List   Diagnosis Date Noted  . Sepsis ([place]) 10/06/2020  . Syncope   . Acute cystitis without hematuria   . Goals of care, counseling/discussion 08/15/2020  . Hypertension   . Coronary artery disease   . RLS (restless legs syndrome) 09/21/2017  . Paroxysmal A-fib ([place]) 01/08/2016  . Mixed hyperlipidemia 01/08/2016  . Hyponatremia 01/03/2016  . Anxiety state 02/03/2015  . Stress at home 02/03/2015  . OBSTRUCTIVE SLEEP APNEA 10/22/2010  . PROSTATE CANCER 10/21/2010  . HYPERCHOLESTEROLEMIA 10/21/2010  . HYPERTENSION 10/21/2010  . CORONARY ARTERY DISEASE  10/21/2010  . DIZZINESS, CHRONIC 10/21/2010    Past Surgical History:  Procedure Laterality Date  . APPENDECTOMY    . CARDIAC CATHETERIZATION     Ejection Fraction was 60%, stent  . KNEE SURGERY     arthroscopic left knee surgery  . SHOULDER ARTHROSCOPY      Prior to Admission medications   Medication Sig Start Date End Date Taking? Authorizing Provider  AMBULATORY NON FORMULARY MEDICATION Trimix (30/1/10)-(Pap/Phent/PGE)  Test Dose  70ml vial   Qty [address] [place] [phone] Fax [phone] 04/29/20   Hollice Espy, MD  aspirin EC 81 MG tablet Take 81 mg by mouth at bedtime.    [provider]  Cholecalciferol (VITAMIN D3) 1000 units CAPS Take 1,000 Units by mouth daily.    [provider]  co-enzyme Q-10 50 MG capsule Take 100 mg by mouth every morning.     [provider]  cyanocobalamin 500 MCG tablet Take 500 mcg by mouth every morning.    [provider]  finasteride (PROSCAR) 5 MG tablet Take by mouth. 04/17/20 04/17/21  [provider]  isosorbide mononitrate (IMDUR) 30 MG 24 hr tablet  04/09/20   [provider]  lansoprazole (PREVACID) 15 MG capsule Take 15-30 mg by mouth See admin instructions. Take 1 capsule by mouth every other day alternating with 2 capsules (30mg ) by mouth every other day.    [provider]  levothyroxine (  SYNTHROID, LEVOTHROID) 50 MCG tablet Take 50 mcg by mouth daily before breakfast.    [provider]  meclizine (ANTIVERT) 25 MG tablet Take 25 mg by mouth 2 (two) times daily as needed for dizziness.    [provider]  Multiple Vitamins-Minerals (PRESERVISION AREDS 2 PO) Take 1 tablet by mouth daily.    [provider]  nitroGLYCERIN (NITROSTAT) 0.4 MG SL tablet Place 0.4 mg under the tongue every 5 (five) minutes as needed for chest pain.    [provider]  oxyCODONE-acetaminophen (PERCOCET/ROXICET) 5-325 MG tablet Take 1 tablet  by mouth every 6 (six) hours as needed for severe pain. 06/13/20   Cuthriell, Charline Bills, PA-C  rOPINIRole (REQUIP) 1 MG tablet Take 1 mg by mouth 3 (three) times daily. 03/10/20   [provider]  sildenafil (VIAGRA) 25 MG tablet Take by mouth. 04/17/20 05/17/20  [provider]  simvastatin (ZOCOR) 40 MG tablet TAKE 1 TABLET BY MOUTH EVERY NIGHT AT BEDTIME 05/26/11   Martinique, Peter M, MD  pramipexole (MIRAPEX) 0.5 MG tablet Take 0.5 mg by mouth at bedtime. 09/18/17 03/15/20  [provider]    Allergies Patient has no known allergies.  Family History  Problem Relation Age of Onset  . COPD Mother     Social History Social History   Tobacco Use  . Smoking status: Never Smoker  . Smokeless tobacco: Never Used  Vaping Use  . Vaping Use: Never used  Substance Use Topics  . Alcohol use: Yes    Comment: 1/2 glass wine daily  . Drug use: No    Review of Systems Constitutional: No fever/chills Eyes: No visual changes. ENT: No sore throat. Cardiovascular: Denies chest pain. Respiratory: Denies shortness of breath. Gastrointestinal: No abdominal pain.  No nausea, no vomiting.  Endorses diarrhea. Genitourinary: Negative for dysuria. Musculoskeletal: Negative for acute arthralgias Skin: Negative for rash. Neurological: Negative for headaches, weakness/numbness/paresthesias in any extremity Psychiatric: Negative for suicidal ideation/homicidal ideation   ____________________________________________   PHYSICAL EXAM:  VITAL SIGNS: ED Triage Vitals  Enc Vitals Group     BP 10/06/20 0918 (!) 107/54     Pulse Rate 10/06/20 0918 61     Resp 10/06/20 0918 16     Temp 10/06/20 0918 (!) 97.4 F (36.3 C)     Temp Source 10/06/20 0918 Oral     SpO2 10/06/20 0918 100 %     Weight 10/06/20 0919 150 lb (68 kg)     Height 10/06/20 0919 5\' 10"  (1.778 m)     Head Circumference --      Peak Flow --      Pain Score 10/06/20 0919 0     Pain Loc --      Pain Edu? --       Excl. in [place]? --    Constitutional: Alert and oriented. Well appearing and in no acute distress. Eyes: Conjunctivae are normal. PERRL. Head: Atraumatic. Nose: No congestion/rhinnorhea. Mouth/Throat: Mucous membranes are moist. Neck: No stridor Cardiovascular: Grossly normal heart sounds.  Good peripheral circulation. Respiratory: Normal respiratory effort.  No retractions. Gastrointestinal: Soft and nontender. No distention. Musculoskeletal: No obvious deformities Neurologic:  Normal speech and language. No gross focal neurologic deficits are appreciated. Skin:  Skin is warm and dry. No rash noted. Psychiatric: Mood and affect are normal. Speech and behavior are normal.  ____________________________________________   LABS (all labs ordered are listed, but only abnormal results are displayed)  Labs Reviewed  COMPREHENSIVE METABOLIC PANEL - Abnormal;  Notable for the following components:      Result Value   Glucose, Bld 121 (*)    Calcium 8.5 (*)    Albumin 3.4 (*)    Alkaline Phosphatase 168 (*)    All other components within normal limits  CBC WITH DIFFERENTIAL/PLATELET - Abnormal; Notable for the following components:   RBC 3.81 (*)    Hemoglobin 11.8 (*)    HCT 37.4 (*)    All other components within normal limits  URINALYSIS, COMPLETE (UACMP) WITH MICROSCOPIC - Abnormal; Notable for the following components:   Color, Urine YELLOW (*)    APPearance TURBID (*)    Hgb urine dipstick SMALL (*)    Protein, ur 30 (*)    Leukocytes,Ua LARGE (*)    WBC, UA >50 (*)    Bacteria, UA MANY (*)    All other components within normal limits  RESP PANEL BY RT-PCR (FLU A&B, COVID) ARPGX2  URINE CULTURE  CULTURE, BLOOD (ROUTINE X 2)  CULTURE, BLOOD (ROUTINE X 2)  LIPASE, BLOOD  PROTIME-INR  TSH  LACTIC ACID, PLASMA  LACTIC ACID, PLASMA  TROPONIN I (HIGH SENSITIVITY)  TROPONIN I (HIGH SENSITIVITY)   ____________________________________________  EKG  ED ECG REPORT I, Naaman Plummer, the attending physician, personally viewed and interpreted this ECG.  Date: 10/06/2020 EKG Time: 1120 Rate: 63 Rhythm: normal sinus rhythm QRS Axis: normal Intervals: normal ST/T Wave abnormalities: normal Narrative Interpretation: no evidence of acute ischemia  ____________________________________________  RADIOLOGY  ED MD interpretation: One-view portable x-ray of the chest shows prominent skin fold with mild infiltrate in the right midlung  Official radiology report(s): DG Chest Port 1 View  Result Date: 10/06/2020 CLINICAL DATA:  Cough.  Weakness.  Syncope. EXAM: PORTABLE CHEST 1 VIEW COMPARISON:  06/23/2020.  Bone scan 8 18 2021.  CT 06/03/2020. FINDINGS: Mediastinum and hilar structures normal. Heart size normal. Prominent skin folds noted on the right. Mild infiltrate right mid lung cannot be excluded. No pleural effusion or pneumothorax. Multiple blastic bony densities are again noted throughout the chest wall consistent with metastatic disease. IMPRESSION: 1. Prominent skin folds noted on the right. Mild infiltrate right mid lung cannot be excluded. 2. Multiple blastic bony densities again noted throughout the chest wall consistent with metastatic disease. Electronically Signed   By: Marcello Moores  Register   On: 10/06/2020 09:21    ____________________________________________   PROCEDURES  Procedure(s) performed (including Critical Care):  .Critical Care Performed by: Naaman Plummer, MD Authorized by: Naaman Plummer, MD   Critical care provider statement:    Critical care time (minutes):  37   Critical care time was exclusive of:  Separately billable procedures and treating other patients   Critical care was necessary to treat or prevent imminent or life-threatening deterioration of the following conditions:  Sepsis   Critical care was time spent personally by me on the following activities:  Discussions with consultants, evaluation of patient's response to  treatment, examination of patient, ordering and performing treatments and interventions, ordering and review of laboratory studies, ordering and review of radiographic studies, pulse oximetry, re-evaluation of patient's condition, obtaining history from patient or surrogate and review of old charts   I assumed direction of critical care for this patient from another provider in my specialty: no   .1-3 Lead EKG Interpretation Performed by: Naaman Plummer, MD Authorized by: Naaman Plummer, MD     Interpretation: normal     ECG rate:  69   ECG rate  assessment: normal     Rhythm: sinus rhythm     Ectopy: none     Conduction: normal       ____________________________________________   INITIAL IMPRESSION / ASSESSMENT AND PLAN / ED COURSE  As part of my medical decision making, I reviewed the following data within the [place] notes reviewed and incorporated, Labs reviewed, EKG interpreted, Old chart reviewed, Radiograph reviewed and Notes from prior ED visits reviewed and incorporated        Patient is a 86 year old male with the above-stated past medical history who presents for a possible syncopal episode via EMS Differential diagnosis for this patient includes but is not limited to: Sepsis, vasovagal syncope, neurogenic syncope, CVA, TIA, cardiogenic shock Laboratory evaluation concerning for grossly infected urine on a UA Given patient's hypotension, urinary tract infection, and altered mental status, he will require admission to the internal medicine service for further evaluation and management.      ____________________________________________   FINAL CLINICAL IMPRESSION(S) / ED DIAGNOSES  Final diagnoses:  Syncope, unspecified syncope type  Acute cystitis without hematuria  Sepsis, due to unspecified organism, unspecified whether acute organ dysfunction present [facility])     ED Discharge Orders    None       Note:  This document was  prepared using Dragon voice recognition software and may include unintentional dictation errors.   Naaman Plummer, MD 10/06/20 [phone]

## 2020-10-06 NOTE — ED Notes (Signed)
Pt assisted to set up self-cath kit as requested. Assisted with hand hygiene.

## 2020-10-07 ENCOUNTER — Inpatient Hospital Stay: Payer: Medicare HMO

## 2020-10-07 ENCOUNTER — Inpatient Hospital Stay
Admit: 2020-10-07 | Discharge: 2020-10-07 | Disposition: A | Discharge status: Home / Self Care | Payer: Medicare HMO | Attending: Internal Medicine | Admitting: Internal Medicine

## 2020-10-07 DIAGNOSIS — A419 Sepsis, unspecified organism: Secondary | ICD-10-CM | POA: Diagnosis not present

## 2020-10-07 DIAGNOSIS — G9340 Encephalopathy, unspecified: Secondary | ICD-10-CM

## 2020-10-07 DIAGNOSIS — R6520 Severe sepsis without septic shock: Secondary | ICD-10-CM

## 2020-10-07 LAB — CBC
HCT: 30.5 % — ABNORMAL LOW (ref 39.0–52.0)
Hemoglobin: 9.8 g/dL — ABNORMAL LOW (ref 13.0–17.0)
MCH: 31.4 pg (ref 26.0–34.0)
MCHC: 32.1 g/dL (ref 30.0–36.0)
MCV: 97.8 fL (ref 80.0–100.0)
Platelets: 207 10*3/uL (ref 150–400)
RBC: 3.12 MIL/uL — ABNORMAL LOW (ref 4.22–5.81)
RDW: 14.4 % (ref 11.5–15.5)
WBC: 10.6 10*3/uL — ABNORMAL HIGH (ref 4.0–10.5)
nRBC: 0 % (ref 0.0–0.2)

## 2020-10-07 LAB — BASIC METABOLIC PANEL
Anion gap: 7 (ref 5–15)
BUN: 21 mg/dL (ref 8–23)
CO2: 21 mmol/L — ABNORMAL LOW (ref 22–32)
Calcium: 7.8 mg/dL — ABNORMAL LOW (ref 8.9–10.3)
Chloride: 111 mmol/L (ref 98–111)
Creatinine, Ser: 0.77 mg/dL (ref 0.61–1.24)
GFR, Estimated: >60 mL/min (ref >60)
Glucose, Bld: 114 mg/dL — ABNORMAL HIGH (ref 70–99)
Potassium: 3.8 mmol/L (ref 3.5–5.1)
Sodium: 139 mmol/L (ref 135–145)

## 2020-10-07 LAB — URINE CULTURE: Culture: <10000 — AB

## 2020-10-07 LAB — CBG MONITORING, ED: Glucose-Capillary: 97 mg/dL (ref 70–99)

## 2020-10-07 LAB — ECHOCARDIOGRAM COMPLETE
AR max vel: 2.48 cm2
AV Area VTI: 2.1 cm2
AV Area mean vel: 2.31 cm2
AV Mean grad: 4 mmHg
AV Peak grad: 8 mmHg
Ao pk vel: 1.41 m/s
Area-P 1/2: 2.67 cm2
Height: 70 in
S' Lateral: 3.89 cm
Weight: 2400 oz

## 2020-10-07 LAB — LACTIC ACID, PLASMA: Lactic Acid, Venous: 2.1 mmol/L (ref 0.5–1.9)

## 2020-10-07 NOTE — ED Notes (Signed)
Pt transported to xray 

## 2020-10-07 NOTE — Progress Notes (Signed)
PROGRESS NOTE    Jason Murphy   PZW:[id number]  DOB: Mar 29, 1920  PCP: Gracelyn Nurse, MD    DOA: 10/06/2020 LOS: 1   Brief Narrative   Jason Murphy is a 86 y.o. male with medical history significant of metastatic prostate cancer on hormone therapy (status post 2 doses, next dose March 2022), paroxysmal A. fib on aspirin, BPH with chronic urinary retention (patient does self catheterization every night before bedtime), HTN, chronic ambulatory dysfunction, presented with syncope.  This occurred while seated at table for breakfast and pt denied any prodrome of symptoms beforehand.      Evaluation in the ED revealed a urinary tract infection.  Admitted for further evaluation and IV antibiotics pending cultures.     Assessment & Plan   Active Problems:   Sepsis (HCC)   Severe Sepsis secondary to UTI - sepsis POA as evidenced by hypotension, fever, tachypnea in setting of UTI Altered mentation reflects organ dysfunction. --Continue Rocephin  --Follow cultures --Monitor post-void residuals  Syncope - suspect due to hypotension in setting of sepsis.  Lack of prodrome little more concerning for cardiac arrhythmia however.  Has hx of pAF, not on rate control med. Patient had a stress test and outpatient echo with [facility] in March 2021 showed LVEF 57%, but echocardiogram showed LVEF 45%?  Moderate mitral regurgitation --Telemetry --Follow up echo --Orthostatics --PT evaluation  Paroxysmal A-Fib - rate controlled.  Not on rate control med. --continue aspirin  Prostate Cancer - outpt follow-up with oncology for hormone therapy  BPH with chronic urinary retention - self caths at bedtime - order placed "self cath HS"  LBBB - follow echo and consider cardiology consult if EF reduced.  Prior recent cardiology evaluation as above.   DVT prophylaxis: enoxaparin (LOVENOX) injection 40 mg Start: 10/06/20 2200   Diet:  Diet Orders (From admission, onward)    Start     Ordered    10/06/20 1459  Diet Heart Room service appropriate? Yes; Fluid consistency: Thin  Diet effective now       Question Answer Comment  Room service appropriate? Yes   Fluid consistency: Thin      10/06/20 1501            Code Status: Full Code    Subjective 10/07/20    Pt seen with daughter at bedside, in ED on hold for a bed.  Pt reports feeling much better today, was hopeful to go home but understands need to stay.  He denies fever/chills this morning.  No dysuria.     Disposition Plan & Communication   Status is: Inpatient  Inpatient status remains appropriate due to severity of illness requiring IV antibiotics for UTI with severe sepsis while cultures pending.  Febrile within past 24 hours as well.  Dispo: The patient is from: [facility]               Anticipated d/c is to: [facility]               Anticipated d/c date is: 2 days pending cx's              Patient currently is not medically stable for d/c.    Family Communication: Daughter Jason Murphy (Database administrator here) was at bedside on rounds this AM.   Consults, Procedures, Significant Events   Consultants:   None  Procedures:   None  Antimicrobials:  Anti-infectives (From admission, onward)   Start     Dose/Rate Route Frequency  Ordered Stop   10/06/20 1415  cefTRIAXone (ROCEPHIN) 1 g in sodium chloride 0.9 % 100 mL IVPB        1 g 200 mL/hr over 30 Minutes Intravenous Every 24 hours 10/06/20 1400          Objective   Vitals:   10/07/20 1730 10/07/20 1800 10/07/20 1830 10/07/20 1930  BP: 114/60 (!) 117/56 118/62 130/62  Pulse: (!) 57 (!) 53 (!) 55 (!) 53  Resp: (!) 22 (!) 21 (!) 25 15  Temp:      TempSrc:      SpO2: 94% 95% 95% 93%  Weight:      Height:        Intake/Output Summary (Last 24 hours) at 10/07/2020 2043 Last data filed at 10/07/2020 1034 Gross per 24 hour  Intake --  Output 1025 ml  Net -1025 ml   Filed Weights   10/06/20 0919  Weight: 68 kg    Physical  Exam:  General exam: awake, alert, no acute distress HEENT: atraumatic, clear conjunctiva, anicteric sclera, moist mucus membranes, hearing grossly normal  Respiratory system: CTAB, no wheezes, rales or rhonchi, normal respiratory effort. Cardiovascular system: normal S1/S2, RRR, no JVD, murmurs, rubs, gallops, no pedal edema.   Gastrointestinal system: soft, NT, ND. Central nervous system: A&O x4. no gross focal neurologic deficits, normal speech Extremities: moves all, no edema, normal tone Psychiatry: normal mood, congruent affect, judgement and insight appear normal  Labs   Data Reviewed: I have personally reviewed following labs and imaging studies  CBC: Recent Labs  Lab 10/06/20 0928 10/07/20 0642  WBC 7.5 10.6*  NEUTROABS 4.3  --   HGB 11.8* 9.8*  HCT 37.4* 30.5*  MCV 98.2 97.8  PLT 280 207   Basic Metabolic Panel: Recent Labs  Lab 10/06/20 0928 10/07/20 0642  NA 140 139  K 3.7 3.8  CL 107 111  CO2 24 21*  GLUCOSE 121* 114*  BUN 22 21  CREATININE 0.99 0.77  CALCIUM 8.5* 7.8*   GFR: Estimated Creatinine Clearance: 49.6 mL/min (by C-G formula based on SCr of 0.77 mg/dL). Liver Function Tests: Recent Labs  Lab 10/06/20 0928  AST 29  ALT 11  ALKPHOS 168*  BILITOT 1.0  PROT 6.6  ALBUMIN 3.4*   Recent Labs  Lab 10/06/20 0928  LIPASE 26   No results for input(s): AMMONIA in the last 168 hours. Coagulation Profile: Recent Labs  Lab 10/06/20 0928  INR 1.2   Cardiac Enzymes: No results for input(s): CKTOTAL, CKMB, CKMBINDEX, TROPONINI in the last 168 hours. BNP (last 3 results) No results for input(s): PROBNP in the last 8760 hours. HbA1C: No results for input(s): HGBA1C in the last 72 hours. CBG: Recent Labs  Lab 10/07/20 0650  GLUCAP 97   Lipid Profile: No results for input(s): CHOL, HDL, LDLCALC, TRIG, CHOLHDL, LDLDIRECT in the last 72 hours. Thyroid Function Tests: Recent Labs    10/06/20 1503  TSH 1.284   Anemia Panel: No results  for input(s): VITAMINB12, FOLATE, FERRITIN, TIBC, IRON, RETICCTPCT in the last 72 hours. Sepsis Labs: Recent Labs  Lab 10/06/20 1548 10/06/20 1831 10/07/20 0912  LATICACIDVEN 2.1* 2.1* 2.1*    Recent Results (from the past 240 hour(s))  Resp Panel by RT-PCR (Flu A&B, Covid) Nasopharyngeal Swab     Status: None   Collection Time: 10/06/20  9:28 AM   Specimen: Nasopharyngeal Swab; Nasopharyngeal(NP) swabs in vial transport medium  Result Value Ref Range Status   SARS Coronavirus 2  by RT PCR NEGATIVE NEGATIVE Final    Comment: (NOTE) SARS-CoV-2 target nucleic acids are NOT DETECTED.  The SARS-CoV-2 RNA is generally detectable in upper respiratory specimens during the acute phase of infection. The lowest concentration of SARS-CoV-2 viral copies this assay can detect is 138 copies/mL. A negative result does not preclude SARS-Cov-2 infection and should not be used as the sole basis for treatment or other patient management decisions. A negative result may occur with  improper specimen collection/handling, submission of specimen other than nasopharyngeal swab, presence of viral mutation(s) within the areas targeted by this assay, and inadequate number of viral copies(<138 copies/mL). A negative result must be combined with clinical observations, patient history, and epidemiological information. The expected result is Negative.  Fact Sheet for Patients:  EntrepreneurPulse.com.au  Fact Sheet for Healthcare Providers:  IncredibleEmployment.be  This test is no t yet approved or cleared by the [place] FDA and  has been authorized for detection and/or diagnosis of SARS-CoV-2 by FDA under an Emergency Use Authorization (EUA). This EUA will remain  in effect (meaning this test can be used) for the duration of the COVID-19 declaration under Section 564(b)(1) of the Act, 21 U.S.C.section 360bbb-3(b)(1), unless the authorization is terminated  or  revoked sooner.       Influenza A by PCR NEGATIVE NEGATIVE Final   Influenza B by PCR NEGATIVE NEGATIVE Final    Comment: (NOTE) The Xpert Xpress SARS-CoV-2/FLU/RSV plus assay is intended as an aid in the diagnosis of influenza from Nasopharyngeal swab specimens and should not be used as a sole basis for treatment. Nasal washings and aspirates are unacceptable for Xpert Xpress SARS-CoV-2/FLU/RSV testing.  Fact Sheet for Patients: EntrepreneurPulse.com.au  Fact Sheet for Healthcare Providers: IncredibleEmployment.be  This test is not yet approved or cleared by the [place] FDA and has been authorized for detection and/or diagnosis of SARS-CoV-2 by FDA under an Emergency Use Authorization (EUA). This EUA will remain in effect (meaning this test can be used) for the duration of the COVID-19 declaration under Section 564(b)(1) of the Act, 21 U.S.C. section 360bbb-3(b)(1), unless the authorization is terminated or revoked.  Performed at [facility], [address] [place], [place] [address]   Blood culture (routine x 2)     Status: None (Preliminary result)   Collection Time: 10/06/20  3:01 PM   Specimen: BLOOD  Result Value Ref Range Status   Specimen Description BLOOD RIGHT ANTECUBITAL  Final   Special Requests   Final    BOTTLES DRAWN AEROBIC AND ANAEROBIC Blood Culture results may not be optimal due to an inadequate volume of blood received in culture bottles   Culture   Final    NO GROWTH < 24 HOURS Performed at [facility], [address], [place], [place] [address]    Report Status PENDING  Incomplete  Blood culture (routine x 2)     Status: None (Preliminary result)   Collection Time: 10/06/20  3:11 PM   Specimen: BLOOD  Result Value Ref Range Status   Specimen Description BLOOD BLOOD RIGHT HAND  Final   Special Requests   Final    BOTTLES DRAWN AEROBIC AND ANAEROBIC Blood Culture results may not be  optimal due to an excessive volume of blood received in culture bottles   Culture   Final    NO GROWTH < 24 HOURS Performed at [facility], [address] [place],  [address]    Report Status PENDING  Incomplete  Urine culture  Status: Abnormal   Collection Time: 10/06/20  5:10 PM   Specimen: Urine, Random  Result Value Ref Range Status   Specimen Description   Final    URINE, RANDOM Performed at [facility], [address], [place], [place] [address]    Special Requests   Final    NONE Performed at [facility], [address] [place], [place] [address]    Culture (A)  Final    <10,000 COLONIES/mL INSIGNIFICANT GROWTH Performed at [facility], [address], [place], [place] [address]    Report Status 10/07/2020 FINAL  Final      Imaging Studies   DG Chest 2 View  Result Date: 10/07/2020 CLINICAL DATA:  Sepsis EXAM: CHEST - 2 VIEW COMPARISON:  10/06/2020 and prior. FINDINGS: No pneumothorax or pleural effusion. Diffuse interstitial prominence with hazy right greater than left pulmonary opacities, unchanged. Stable cardiomediastinal silhouette. No acute osseous abnormality. IMPRESSION: Unchanged appearance of interstitial prominence and hazy bilateral pulmonary opacities. Electronically Signed   By: Stana Bunting M.D.   On: 10/07/2020 08:06   DG Chest Port 1 View  Result Date: 10/06/2020 CLINICAL DATA:  Cough.  Weakness.  Syncope. EXAM: PORTABLE CHEST 1 VIEW COMPARISON:  06/23/2020.  Bone scan 8 18 2021.  CT 06/03/2020. FINDINGS: Mediastinum and hilar structures normal. Heart size normal. Prominent skin folds noted on the right. Mild infiltrate right mid lung cannot be excluded. No pleural effusion or pneumothorax. Multiple blastic bony densities are again noted throughout the chest wall consistent with metastatic disease. IMPRESSION: 1. Prominent skin folds noted on the right. Mild infiltrate right mid lung cannot be  excluded. 2. Multiple blastic bony densities again noted throughout the chest wall consistent with metastatic disease. Electronically Signed   By: Maisie Fus  Register   On: 10/06/2020 09:21   ECHOCARDIOGRAM COMPLETE  Result Date: 10/07/2020    ECHOCARDIOGRAM REPORT   Patient Name:   Jason Murphy Date of Exam: 10/07/2020 Medical Rec #:  [id number]      Height:       70.0 in Accession #:    [id number]     Weight:       150.0 lb Date of Birth:  [birth year]       BSA:          1.847 m Patient Age:    89 years       BP:           131/73 mmHg Patient Gender: M              HR:           58 bpm. Exam Location:  [facility] Procedure: 2D Echo, Color Doppler and Cardiac Doppler Indications:     R55 Syncope  History:         Patient has prior history of Echocardiogram examinations. CAD,                  HCL, Signs/Symptoms:Syncope; Risk Factors:Sleep Apnea and                  Hypertension.  Sonographer:     Humphrey Rolls RDCS (AE) Referring Phys:  [id number] Emeline General Diagnosing Phys: Marcina Millard MD  Sonographer Comments: Technically challenging study due to limited acoustic windows. Image acquisition challenging due to respiratory motion. IMPRESSIONS  1. Left ventricular ejection fraction, by estimation, is 35 to 40%. The left ventricle has moderately decreased function. The left ventricle has no regional wall motion  abnormalities. Left ventricular diastolic parameters were normal.  2. Right ventricular systolic function is normal. The right ventricular size is normal.  3. The mitral valve is normal in structure. Mild mitral valve regurgitation. No evidence of mitral stenosis.  4. The aortic valve is normal in structure. Aortic valve regurgitation is not visualized. No aortic stenosis is present.  5. The inferior vena cava is normal in size with greater than 50% respiratory variability, suggesting right atrial pressure of 3 mmHg. FINDINGS  Left Ventricle: Left ventricular ejection fraction, by estimation, is 35 to 40%. The  left ventricle has moderately decreased function. The left ventricle has no regional wall motion abnormalities. The left ventricular internal cavity size was normal in size. There is no left ventricular hypertrophy. Left ventricular diastolic parameters were normal. Right Ventricle: The right ventricular size is normal. No increase in right ventricular wall thickness. Right ventricular systolic function is normal. Left Atrium: Left atrial size was normal in size. Right Atrium: Right atrial size was normal in size. Pericardium: There is no evidence of pericardial effusion. Mitral Valve: The mitral valve is normal in structure. Mild mitral valve regurgitation. No evidence of mitral valve stenosis. MV peak gradient, 3.1 mmHg. The mean mitral valve gradient is 1.0 mmHg. Tricuspid Valve: The tricuspid valve is normal in structure. Tricuspid valve regurgitation is mild . No evidence of tricuspid stenosis. Aortic Valve: The aortic valve is normal in structure. Aortic valve regurgitation is not visualized. No aortic stenosis is present. Aortic valve mean gradient measures 4.0 mmHg. Aortic valve peak gradient measures 8.0 mmHg. Aortic valve area, by VTI measures 2.10 cm. Pulmonic Valve: The pulmonic valve was normal in structure. Pulmonic valve regurgitation is not visualized. No evidence of pulmonic stenosis. Aorta: The aortic root is normal in size and structure. Venous: The inferior vena cava is normal in size with greater than 50% respiratory variability, suggesting right atrial pressure of 3 mmHg. IAS/Shunts: No atrial level shunt detected by color flow Doppler.  LEFT VENTRICLE PLAX 2D LVIDd:         4.39 cm  Diastology LVIDs:         3.89 cm  LV e' medial:    5.44 cm/s LV PW:         1.03 cm  LV E/e' medial:  10.1 LV IVS:        0.93 cm  LV e' lateral:   9.90 cm/s LVOT diam:     2.50 cm  LV E/e' lateral: 5.5 LV SV:         66 LV SV Index:   36 LVOT Area:     4.91 cm  AORTIC VALVE AV Area (Vmax):    2.48 cm AV Area  (Vmean):   2.31 cm AV Area (VTI):     2.10 cm AV Vmax:           141.00 cm/s AV Vmean:          99.400 cm/s AV VTI:            0.313 m AV Peak Grad:      8.0 mmHg AV Mean Grad:      4.0 mmHg LVOT Vmax:         71.10 cm/s LVOT Vmean:        46.700 cm/s LVOT VTI:          0.134 m LVOT/AV VTI ratio: 0.43  AORTA Ao Root diam: 3.50 cm MITRAL VALVE MV Area (PHT): 2.67 cm    SHUNTS MV  Peak grad:  3.1 mmHg    Systemic VTI:  0.13 m MV Mean grad:  1.0 mmHg    Systemic Diam: 2.50 cm MV Vmax:       0.88 m/s MV Vmean:      49.8 cm/s MV Decel Time: 284 msec MV E velocity: 54.80 cm/s MV A velocity: 77.10 cm/s MV E/A ratio:  0.71 Marcina Millard MD Electronically signed by Marcina Millard MD Signature Date/Time: 10/07/2020/1:14:57 PM    Final      Medications   Scheduled Meds:  aspirin EC  81 mg Oral QHS   cholecalciferol  1,000 Units Oral Daily   enoxaparin (LOVENOX) injection  40 mg Subcutaneous Q24H   finasteride  5 mg Oral Daily   isosorbide mononitrate  30 mg Oral Daily   levothyroxine  50 mcg Oral QAC breakfast   multivitamin-lutein  1 capsule Oral Daily   pantoprazole  20 mg Oral Daily   rOPINIRole  1 mg Oral TID   simvastatin  40 mg Oral QHS   sodium chloride flush  3 mL Intravenous Q12H   vitamin B-12  500 mcg Oral BH-q7a   Continuous Infusions:  cefTRIAXone (ROCEPHIN)  IV Stopped (10/07/20 1553)       LOS: 1 day    Time spent: 30 minutes    Pennie Banter, DO [facility]  10/07/2020, 8:43 PM    If 7PM-7AM, please contact night-coverage. How to contact the [facility] Attending or Consulting provider 7A - 7P or covering provider during after hours 7P -7A, for this patient?    1. Check the care team in [facility] and look for a) attending/consulting TR[facility] provider listed and b) the [facility] team listed 2. Log into [email or web address] and use [facility]'s universal password to access. If you do not have the password, please contact the hospital operator. 3. Locate the [facility]  provider you are looking for under [facility] and page to a number that you can be directly reached. 4. If you still have difficulty reaching the provider, please page the [facility] (Director on Call) for the Hospitalists listed on amion for assistance.

## 2020-10-07 NOTE — ED Notes (Addendum)
Pt oxygen saturations decreased to 87-88% while sleeping. Pt woken up with oxygen increasing to 93-96%. Pt placed back on 2L [place] for comfort while sleeping.

## 2020-10-07 NOTE — ED Notes (Signed)
Pt repositioned in bed. Head of the bed raised and pt provided meal tray. Pt denies any further needs at this time.

## 2020-10-07 NOTE — ED Notes (Addendum)
Pt readjusted in bed. TV playing music and lights dimmed for comfort. Pt denies any further needs at this time. Still waiting for pharmacy to send proscar

## 2020-10-07 NOTE — ED Notes (Signed)
Family update contact info, [name] [phone]

## 2020-10-07 NOTE — ED Notes (Signed)
Pt assisted to the bedside commode in attempt to have a BM.

## 2020-10-07 NOTE — ED Notes (Signed)
Pt stating he feels better and is wanting to leave. MD made aware.

## 2020-10-07 NOTE — Hospital Course (Signed)
Jason Murphy is a 86 y.o. male with medical history significant of metastatic prostate cancer on hormone therapy (status post 2 doses, next dose March 2022), paroxysmal A. fib on aspirin, BPH with chronic urinary retention (patient does self catheterization every night before bedtime), HTN, chronic ambulatory dysfunction, presented with syncope.  This occurred while seated at table for breakfast and pt denied any prodrome of symptoms beforehand.      Evaluation in the ED revealed a urinary tract infection.  Admitted for further evaluation and IV antibiotics pending cultures.

## 2020-10-07 NOTE — Progress Notes (Signed)
*  PRELIMINARY RESULTS* Echocardiogram 2D Echocardiogram has been performed.  Jason Murphy 10/07/2020, 10:03 AM

## 2020-10-07 NOTE — Evaluation (Signed)
Physical Therapy Evaluation Patient Details Name: Jason Murphy MRN: [id number] DOB: 15-May-1920 Today's Date: 10/07/2020   History of Present Illness  85 y.o. male with medical history significant of metastatic prostate cancer on hormone therapy (status post 2 doses, next dose March 2022), paroxysmal A. fib on aspirin, BPH with chronic urinary retention (patient does self catheterization every night before bedtime), HTN, chronic ambulatory dysfunction, presented with syncope.  Episode happened this morning before breakfast around the clock, patient was sitting at the table, all of a sudden passed out, denied any prodrome of feeling lightheaded palpitations sweating.  Clinical Impression  Pt showed good effort with all tasks, eager to get up and walk/do some activity.  He reports being independent w/o AD and still driving on occasion.  He did need some assist in getting up to EOB and reports feeling weaker than his baseline, however he had age appropriate strength and functional ROM and was able to walk ~75 ft with safe and consistent cadence.  He did use walker during out gait training bout and admits that he is safer and more confident with it than w/o.  Pt safe to return to [facility] when medically cleared for discharge, will benefit from continued PT in that setting as well as a rolling walker.     Follow Up Recommendations Home health PT    Equipment Recommendations  Rolling walker with 5" wheels    Recommendations for Other Services       Precautions / Restrictions Precautions Precautions: Fall Restrictions Weight Bearing Restrictions: No      Mobility  Bed Mobility Overal bed mobility: Needs Assistance Bed Mobility: Supine to Sit;Sit to Supine     Supine to sit: Min assist Sit to supine: Min guard   General bed mobility comments: Pt showed good effort in getting up to sitting, but did ultimately need some light assist to attain upright.  Able to get b/l LEs back into bed  after ambulation    Transfers Overall transfer level: Modified independent Equipment used: Rolling walker (2 wheeled)             General transfer comment: Pt showed good safety and confidence with getting to standing, no hands on assist needed  Ambulation/Gait Ambulation/Gait assistance: Supervision Gait Distance (Feet): 75 Feet Assistive device: Rolling walker (2 wheeled)       General Gait Details: Pt was able to assume confident cadence and community appropriate speed w/o any safety concerns.  Pt on 2L O2 t/o the effort, sats sayed in the 90s.  Stairs            Wheelchair Mobility    Modified Rankin (Stroke Patients Only)       Balance Overall balance assessment: Modified Independent                                           Pertinent Vitals/Pain Pain Assessment: No/denies pain    Home Living Family/patient expects to be discharged to:: Assisted living (return to [facility])               Home Equipment: None      Prior Function Level of Independence: Independent (Pt still driving, does not currently use AD)               Hand Dominance        Extremity/Trunk Assessment   Upper Extremity  Assessment Upper Extremity Assessment: Overall WFL for tasks assessed;Generalized weakness (age appropriate deficits)    Lower Extremity Assessment Lower Extremity Assessment: Overall WFL for tasks assessed;Generalized weakness (age appropriate deficits)       Communication   Communication: No difficulties  Cognition Arousal/Alertness: Awake/alert Behavior During Therapy: WFL for tasks assessed/performed Overall Cognitive Status: Within Functional Limits for tasks assessed                                        General Comments      Exercises     Assessment/Plan    PT Assessment Patient needs continued PT services  PT Problem List Decreased strength;Decreased range of motion;Decreased activity  tolerance;Decreased balance;Decreased mobility;Decreased knowledge of use of DME;Decreased safety awareness       PT Treatment Interventions DME instruction;Gait training;Stair training;Functional mobility training;Therapeutic activities;Therapeutic exercise;Balance training;Patient/family education    PT Goals (Current goals can be found in the Care Plan section)  Acute Rehab PT Goals Patient Stated Goal: back to [facility] tomorrow PT Goal Formulation: With patient Time For Goal Achievement: 10/21/20 Potential to Achieve Goals: Good    Frequency Min 2X/week   Barriers to discharge        Co-evaluation               AM-PAC PT "6 Clicks" Mobility  Outcome Measure Help needed turning from your back to your side while in a flat bed without using bedrails?: A Little Help needed moving from lying on your back to sitting on the side of a flat bed without using bedrails?: A Little Help needed moving to and from a bed to a chair (including a wheelchair)?: None Help needed standing up from a chair using your arms (e.g., wheelchair or bedside chair)?: None Help needed to walk in hospital room?: None Help needed climbing 3-5 steps with a railing? : A Little 6 Click Score: 21    End of Session Equipment Utilized During Treatment: Gait belt;Oxygen (2L, O2 in the low 90s on arrival and stayed in the 90s t/o activity) Activity Tolerance: Patient tolerated treatment well Patient left: with call bell/phone within reach;in bed Nurse Communication: Mobility status PT Visit Diagnosis: Muscle weakness (generalized) (M62.81);Difficulty in walking, not elsewhere classified (R26.2)    Time: 6269-4854 PT Time Calculation (min) (ACUTE ONLY): 20 min   Charges:   PT Evaluation $PT Eval Low Complexity: 1 Low PT Treatments $Gait Training: 8-22 mins        Kreg Shropshire, DPT 10/07/2020, 5:52 PM

## 2020-10-07 NOTE — ED Notes (Signed)
Admitting MD at bedside.

## 2020-10-07 NOTE — ED Notes (Signed)
Pt given meal tray.

## 2020-10-07 NOTE — ED Notes (Signed)
Pt ambulatory in hallway with PT

## 2020-10-07 NOTE — ED Notes (Signed)
In an out tray set up at bedside. Pt performing self cath at this time. Family at bedside

## 2020-10-07 NOTE — ED Notes (Signed)
This RN at bedside. Pt with soiled linens. Pt cleaned. Clean linens, brieg and chux in place.

## 2020-10-07 NOTE — ED Notes (Signed)
Pt's daughter-n-law at bedside.

## 2020-10-07 NOTE — ED Notes (Signed)
Pt alert and awake,.  Pt moved to room 31.  Iv in place

## 2020-10-08 ENCOUNTER — Inpatient Hospital Stay: Payer: Medicare HMO

## 2020-10-08 ENCOUNTER — Encounter: Payer: Self-pay | Admitting: Internal Medicine

## 2020-10-08 DIAGNOSIS — D65 Disseminated intravascular coagulation [defibrination syndrome]: Secondary | ICD-10-CM

## 2020-10-08 DIAGNOSIS — R6520 Severe sepsis without septic shock: Secondary | ICD-10-CM | POA: Diagnosis not present

## 2020-10-08 DIAGNOSIS — A419 Sepsis, unspecified organism: Secondary | ICD-10-CM | POA: Diagnosis not present

## 2020-10-08 LAB — CBC
HCT: 33 % — ABNORMAL LOW (ref 39.0–52.0)
Hemoglobin: 10.5 g/dL — ABNORMAL LOW (ref 13.0–17.0)
MCH: 31.3 pg (ref 26.0–34.0)
MCHC: 31.8 g/dL (ref 30.0–36.0)
MCV: 98.5 fL (ref 80.0–100.0)
Platelets: 203 10*3/uL (ref 150–400)
RBC: 3.35 MIL/uL — ABNORMAL LOW (ref 4.22–5.81)
RDW: 14.1 % (ref 11.5–15.5)
WBC: 7.1 10*3/uL (ref 4.0–10.5)
nRBC: 0 % (ref 0.0–0.2)

## 2020-10-08 LAB — CBG MONITORING, ED: Glucose-Capillary: 114 mg/dL — ABNORMAL HIGH (ref 70–99)

## 2020-10-08 LAB — BASIC METABOLIC PANEL
Anion gap: 6 (ref 5–15)
BUN: 16 mg/dL (ref 8–23)
CO2: 23 mmol/L (ref 22–32)
Calcium: 8 mg/dL — ABNORMAL LOW (ref 8.9–10.3)
Chloride: 110 mmol/L (ref 98–111)
Creatinine, Ser: 0.78 mg/dL (ref 0.61–1.24)
GFR, Estimated: >60 mL/min (ref >60)
Glucose, Bld: 95 mg/dL (ref 70–99)
Potassium: 3.6 mmol/L (ref 3.5–5.1)
Sodium: 139 mmol/L (ref 135–145)

## 2020-10-08 LAB — LACTIC ACID, PLASMA
Lactic Acid, Venous: 2.5 mmol/L (ref 0.5–1.9)
Lactic Acid, Venous: 2 mmol/L (ref 0.5–1.9)

## 2020-10-08 LAB — PROCALCITONIN: Procalcitonin: <0.1 ng/mL

## 2020-10-08 LAB — BRAIN NATRIURETIC PEPTIDE: B Natriuretic Peptide: 272.6 pg/mL — ABNORMAL HIGH (ref 0.0–100.0)

## 2020-10-08 LAB — MAGNESIUM: Magnesium: 1.8 mg/dL (ref 1.7–2.4)

## 2020-10-08 MED ORDER — SODIUM CHLORIDE 0.9 % IV BOLUS
250.0000 mL | Freq: Once | INTRAVENOUS | Status: AC
  Administered 2020-10-08: 15:00:00 250 mL via INTRAVENOUS

## 2020-10-08 MED ORDER — FUROSEMIDE 10 MG/ML IJ SOLN
40.0000 mg | Freq: Once | INTRAMUSCULAR | Status: AC
  Administered 2020-10-08: 10:00:00 40 mg via INTRAVENOUS
  Filled 2020-10-08: qty 4

## 2020-10-08 NOTE — Progress Notes (Signed)
PROGRESS NOTE    Jason Murphy   [id number]  DOB: 1920/06/09  PCP: Baxter Hire, MD    DOA: 10/06/2020 LOS: 2   Brief Narrative   Jason Murphy is a 86 y.o. male with medical history significant of metastatic prostate cancer on hormone therapy (status post 2 doses, next dose March 2022), paroxysmal A. fib on aspirin, BPH with chronic urinary retention (patient does self catheterization every night before bedtime), HTN, chronic ambulatory dysfunction, presented with syncope.  This occurred while seated at table for breakfast and pt denied any prodrome of symptoms beforehand.      Evaluation in the ED revealed a urinary tract infection.  Admitted for further evaluation and IV antibiotics pending cultures.     Assessment & Plan   Active Problems:   Sepsis ([place])   Severe Sepsis secondary to UTI - sepsis POA as evidenced by hypotension, fever, tachypnea in setting of UTI Altered mentation and lactic acidosis reflects organ dysfunction. --Continue Rocephin  --Follow cultures --Monitor post-void residuals 12/23 lactate still elevated.  Will give small fluid bolus and recheck.  Syncope - suspect due to hypotension in setting of sepsis.  Lack of prodrome little more concerning for cardiac arrhythmia however.  Has hx of pAF, not on rate control med. Patient had a stress test and outpatient echo with [facility] in March 2021 showed LVEF 57%, but echocardiogram showed LVEF 45%?  Moderate mitral regurgitation --Telemetry --Follow up echo --Orthostatics --PT evaluation  Paroxysmal A-Fib - rate controlled.  Not on rate control med. --continue aspirin  Prostate Cancer - outpt follow-up with oncology for hormone therapy  BPH with chronic urinary retention - self caths at bedtime - order placed "self cath HS"  LBBB - follow echo and consider cardiology consult if EF reduced.  Prior recent cardiology evaluation as above.   DVT prophylaxis: enoxaparin (LOVENOX) injection 40 mg  Start: 10/06/20 2200   Diet:  Diet Orders (From admission, onward)    Start     Ordered   10/06/20 1459  Diet Heart Room service appropriate? Yes; Fluid consistency: Thin  Diet effective now       Question Answer Comment  Room service appropriate? Yes   Fluid consistency: Thin      10/06/20 1501            Code Status: Full Code    Subjective 10/08/20    Pt seen with daughter at bedside this morning.  He really wants to go home today.  Says he feels fine and has no acute complaints.  Discussed weaning off oxygen and checking a couple of labs to see how his infection is improving first before deciding on discharge.   Disposition Plan & Communication   Status is: Inpatient  Inpatient status remains appropriate due to severity of illness requiring IV antibiotics.  Still with lactic acidosis today  Dispo: The patient is from: [facility]               Anticipated d/c is to: [facility]               Anticipated d/c date is: 1 day              Patient currently is not medically stable for d/c.    Family Communication: Daughter [name] (Financial risk analyst here) was at bedside on rounds this AM, and updated again this afternoon regarding no discharge today.    Consults, Procedures, Significant Events   Consultants:   None  Procedures:   None  Antimicrobials:  Anti-infectives (From admission, onward)   Start     Dose/Rate Route Frequency Ordered Stop   10/06/20 1415  cefTRIAXone (ROCEPHIN) 1 g in sodium chloride 0.9 % 100 mL IVPB        1 g 200 mL/hr over 30 Minutes Intravenous Every 24 hours 10/06/20 1400          Objective   Vitals:   10/08/20 0945 10/08/20 1041 10/08/20 1137 10/08/20 1200  BP:  138/69  126/64  Pulse: 60 60 (!) 57 (!) 55  Resp: 17 18  16   Temp:  98.6 F (37 C)  97.8 F (36.6 C)  TempSrc:  Oral  Oral  SpO2: 94% 95% 94% 96%  Weight:      Height:        Intake/Output Summary (Last 24 hours) at 10/08/2020 1554 Last data filed at  10/08/2020 1406 Gross per 24 hour  Intake 240 ml  Output 1350 ml  Net -1110 ml   Filed Weights   10/06/20 0919  Weight: 68 kg    Physical Exam:  General exam: awake, alert, no acute distress Respiratory system: CTAB, no wheezes, rales or rhonchi, normal respiratory effort. Cardiovascular system: normal S1/S2, RRR Gastrointestinal system: soft, NT, ND Central nervous system: A&O x3. no gross focal neurologic deficits, normal speech Extremities: moves all, no edema, normal tone   Labs   Data Reviewed: I have personally reviewed following labs and imaging studies  CBC: Recent Labs  Lab 10/06/20 0928 10/07/20 0642 10/08/20 0454  WBC 7.5 10.6* 7.1  NEUTROABS 4.3  --   --   HGB 11.8* 9.8* 10.5*  HCT 37.4* 30.5* 33.0*  MCV 98.2 97.8 98.5  PLT 280 207 [id number]   Basic Metabolic Panel: Recent Labs  Lab 10/06/20 0928 10/07/20 0642 10/08/20 0454  NA 140 139 139  K 3.7 3.8 3.6  CL 107 111 110  CO2 24 21* 23  GLUCOSE 121* 114* 95  BUN 22 21 16   CREATININE 0.99 0.77 0.78  CALCIUM 8.5* 7.8* 8.0*  MG  --   --  1.8   GFR: Estimated Creatinine Clearance: 49.6 mL/min (by C-G formula based on SCr of 0.78 mg/dL). Liver Function Tests: Recent Labs  Lab 10/06/20 0928  AST 29  ALT 11  ALKPHOS 168*  BILITOT 1.0  PROT 6.6  ALBUMIN 3.4*   Recent Labs  Lab 10/06/20 0928  LIPASE 26   No results for input(s): AMMONIA in the last 168 hours. Coagulation Profile: Recent Labs  Lab 10/06/20 0928  INR 1.2   Cardiac Enzymes: No results for input(s): CKTOTAL, CKMB, CKMBINDEX, TROPONINI in the last 168 hours. BNP (last 3 results) No results for input(s): PROBNP in the last 8760 hours. HbA1C: No results for input(s): HGBA1C in the last 72 hours. CBG: Recent Labs  Lab 10/07/20 0650 10/08/20 0444  GLUCAP 97 114*   Lipid Profile: No results for input(s): CHOL, HDL, LDLCALC, TRIG, CHOLHDL, LDLDIRECT in the last 72 hours. Thyroid Function Tests: Recent Labs     10/06/20 1503  TSH 1.284   Anemia Panel: No results for input(s): VITAMINB12, FOLATE, FERRITIN, TIBC, IRON, RETICCTPCT in the last 72 hours. Sepsis Labs: Recent Labs  Lab 10/06/20 1548 10/06/20 1831 10/07/20 0912 10/08/20 1338  PROCALCITON  --   --   --  <0.10  LATICACIDVEN 2.1* 2.1* 2.1* 2.5*    Recent Results (from the past 240 hour(s))  Resp Panel by RT-PCR (Flu A&B, Covid) Nasopharyngeal  Swab     Status: None   Collection Time: 10/06/20  9:28 AM   Specimen: Nasopharyngeal Swab; Nasopharyngeal(NP) swabs in vial transport medium  Result Value Ref Range Status   SARS Coronavirus 2 by RT PCR NEGATIVE NEGATIVE Final    Comment: (NOTE) SARS-CoV-2 target nucleic acids are NOT DETECTED.  The SARS-CoV-2 RNA is generally detectable in upper respiratory specimens during the acute phase of infection. The lowest concentration of SARS-CoV-2 viral copies this assay can detect is 138 copies/mL. A negative result does not preclude SARS-Cov-2 infection and should not be used as the sole basis for treatment or other patient management decisions. A negative result may occur with  improper specimen collection/handling, submission of specimen other than nasopharyngeal swab, presence of viral mutation(s) within the areas targeted by this assay, and inadequate number of viral copies(<138 copies/mL). A negative result must be combined with clinical observations, patient history, and epidemiological information. The expected result is Negative.  Fact Sheet for Patients:  EntrepreneurPulse.com.au  Fact Sheet for Healthcare Providers:  IncredibleEmployment.be  This test is no t yet approved or cleared by the [place] FDA and  has been authorized for detection and/or diagnosis of SARS-CoV-2 by FDA under an Emergency Use Authorization (EUA). This EUA will remain  in effect (meaning this test can be used) for the duration of the COVID-19 declaration under  Section 564(b)(1) of the Act, 21 U.S.C.section 360bbb-3(b)(1), unless the authorization is terminated  or revoked sooner.       Influenza A by PCR NEGATIVE NEGATIVE Final   Influenza B by PCR NEGATIVE NEGATIVE Final    Comment: (NOTE) The Xpert Xpress SARS-CoV-2/FLU/RSV plus assay is intended as an aid in the diagnosis of influenza from Nasopharyngeal swab specimens and should not be used as a sole basis for treatment. Nasal washings and aspirates are unacceptable for Xpert Xpress SARS-CoV-2/FLU/RSV testing.  Fact Sheet for Patients: EntrepreneurPulse.com.au  Fact Sheet for Healthcare Providers: IncredibleEmployment.be  This test is not yet approved or cleared by the [place] FDA and has been authorized for detection and/or diagnosis of SARS-CoV-2 by FDA under an Emergency Use Authorization (EUA). This EUA will remain in effect (meaning this test can be used) for the duration of the COVID-19 declaration under Section 564(b)(1) of the Act, 21 U.S.C. section 360bbb-3(b)(1), unless the authorization is terminated or revoked.  Performed at [facility], [address] [place], [place] [address]   Blood culture (routine x 2)     Status: None (Preliminary result)   Collection Time: 10/06/20  3:01 PM   Specimen: BLOOD  Result Value Ref Range Status   Specimen Description BLOOD RIGHT ANTECUBITAL  Final   Special Requests   Final    BOTTLES DRAWN AEROBIC AND ANAEROBIC Blood Culture results may not be optimal due to an inadequate volume of blood received in culture bottles   Culture   Final    NO GROWTH 2 DAYS Performed at [facility], [address] [place], [place] [address]    Report Status PENDING  Incomplete  Blood culture (routine x 2)     Status: None (Preliminary result)   Collection Time: 10/06/20  3:11 PM   Specimen: BLOOD  Result Value Ref Range Status   Specimen Description BLOOD BLOOD RIGHT HAND  Final    Special Requests   Final    BOTTLES DRAWN AEROBIC AND ANAEROBIC Blood Culture results may not be optimal due to an excessive volume of blood received in culture bottles  Culture   Final    NO GROWTH 2 DAYS Performed at [facility], [address] [place], [place] [address]    Report Status PENDING  Incomplete  Urine culture     Status: Abnormal   Collection Time: 10/06/20  5:10 PM   Specimen: Urine, Random  Result Value Ref Range Status   Specimen Description   Final    URINE, RANDOM Performed at [facility], [address], [place], [place] [address]    Special Requests   Final    NONE Performed at [facility], [address] [place], [place] [address]    Culture (A)  Final    <10,000 COLONIES/mL INSIGNIFICANT GROWTH Performed at [facility], [place] [address] [place], [place] [address]    Report Status 10/07/2020 FINAL  Final      Imaging Studies   DG Chest 2 View  Result Date: 10/07/2020 CLINICAL DATA:  Sepsis EXAM: CHEST - 2 VIEW COMPARISON:  10/06/2020 and prior. FINDINGS: No pneumothorax or pleural effusion. Diffuse interstitial prominence with hazy right greater than left pulmonary opacities, unchanged. Stable cardiomediastinal silhouette. No acute osseous abnormality. IMPRESSION: Unchanged appearance of interstitial prominence and hazy bilateral pulmonary opacities. Electronically Signed   By: Primitivo Gauze M.D.   On: 10/07/2020 08:06   DG Chest Port 1 View  Result Date: 10/08/2020 CLINICAL DATA:  Hypoxia, history of prostate cancer EXAM: PORTABLE CHEST 1 VIEW COMPARISON:  10/07/2020 FINDINGS: Chronic interstitial changes with superimposed ill-defined increased density, right greater than left. Slight worsening of lung aeration for example at the right base. No pleural effusion or pneumothorax. Foci of osseous sclerosis reflecting known metastatic disease. IMPRESSION: Persistent ill-defined opacities, right greater than  left, with slight worsening of lung aeration since 10/07/2020. Electronically Signed   By: Macy Mis M.D.   On: 10/08/2020 08:05   ECHOCARDIOGRAM COMPLETE  Result Date: 10/07/2020    ECHOCARDIOGRAM REPORT   Patient Name:   JARAMIAH JUD Date of Exam: 10/07/2020 Medical Rec #:  [id number]      Height:       70.0 in Accession #:    [id number]     Weight:       150.0 lb Date of Birth:  [birth year]       BSA:          1.847 m Patient Age:    42 years       BP:           131/73 mmHg Patient Gender: M              HR:           58 bpm. Exam Location:  [facility] Procedure: 2D Echo, Color Doppler and Cardiac Doppler Indications:     R55 Syncope  History:         Patient has prior history of Echocardiogram examinations. CAD,                  HCL, Signs/Symptoms:Syncope; Risk Factors:Sleep Apnea and                  Hypertension.  Sonographer:     Charmayne Sheer RDCS (AE) Referring Phys:  [id number] Lequita Halt Diagnosing Phys: Isaias Cowman MD  Sonographer Comments: Technically challenging study due to limited acoustic windows. Image acquisition challenging due to respiratory motion. IMPRESSIONS  1. Left ventricular ejection fraction, by estimation, is 35 to 40%. The left ventricle has moderately decreased function. The  left ventricle has no regional wall motion abnormalities. Left ventricular diastolic parameters were normal.  2. Right ventricular systolic function is normal. The right ventricular size is normal.  3. The mitral valve is normal in structure. Mild mitral valve regurgitation. No evidence of mitral stenosis.  4. The aortic valve is normal in structure. Aortic valve regurgitation is not visualized. No aortic stenosis is present.  5. The inferior vena cava is normal in size with greater than 50% respiratory variability, suggesting right atrial pressure of 3 mmHg. FINDINGS  Left Ventricle: Left ventricular ejection fraction, by estimation, is 35 to 40%. The left ventricle has moderately decreased function. The  left ventricle has no regional wall motion abnormalities. The left ventricular internal cavity size was normal in size. There is no left ventricular hypertrophy. Left ventricular diastolic parameters were normal. Right Ventricle: The right ventricular size is normal. No increase in right ventricular wall thickness. Right ventricular systolic function is normal. Left Atrium: Left atrial size was normal in size. Right Atrium: Right atrial size was normal in size. Pericardium: There is no evidence of pericardial effusion. Mitral Valve: The mitral valve is normal in structure. Mild mitral valve regurgitation. No evidence of mitral valve stenosis. MV peak gradient, 3.1 mmHg. The mean mitral valve gradient is 1.0 mmHg. Tricuspid Valve: The tricuspid valve is normal in structure. Tricuspid valve regurgitation is mild . No evidence of tricuspid stenosis. Aortic Valve: The aortic valve is normal in structure. Aortic valve regurgitation is not visualized. No aortic stenosis is present. Aortic valve mean gradient measures 4.0 mmHg. Aortic valve peak gradient measures 8.0 mmHg. Aortic valve area, by VTI measures 2.10 cm. Pulmonic Valve: The pulmonic valve was normal in structure. Pulmonic valve regurgitation is not visualized. No evidence of pulmonic stenosis. Aorta: The aortic root is normal in size and structure. Venous: The inferior vena cava is normal in size with greater than 50% respiratory variability, suggesting right atrial pressure of 3 mmHg. IAS/Shunts: No atrial level shunt detected by color flow Doppler.  LEFT VENTRICLE PLAX 2D LVIDd:         4.39 cm  Diastology LVIDs:         3.89 cm  LV e' medial:    5.44 cm/s LV PW:         1.03 cm  LV E/e' medial:  10.1 LV IVS:        0.93 cm  LV e' lateral:   9.90 cm/s LVOT diam:     2.50 cm  LV E/e' lateral: 5.5 LV SV:         66 LV SV Index:   36 LVOT Area:     4.91 cm  AORTIC VALVE AV Area (Vmax):    2.48 cm AV Area (Vmean):   2.31 cm AV Area (VTI):     2.10 cm AV  Vmax:           141.00 cm/s AV Vmean:          99.400 cm/s AV VTI:            0.313 m AV Peak Grad:      8.0 mmHg AV Mean Grad:      4.0 mmHg LVOT Vmax:         71.10 cm/s LVOT Vmean:        46.700 cm/s LVOT VTI:          0.134 m LVOT/AV VTI ratio: 0.43  AORTA Ao Root diam: 3.50 cm MITRAL VALVE MV Area (PHT):  2.67 cm    SHUNTS MV Peak grad:  3.1 mmHg    Systemic VTI:  0.13 m MV Mean grad:  1.0 mmHg    Systemic Diam: 2.50 cm MV Vmax:       0.88 m/s MV Vmean:      49.8 cm/s MV Decel Time: 284 msec MV E velocity: 54.80 cm/s MV A velocity: 77.10 cm/s MV E/A ratio:  0.71 Isaias Cowman MD Electronically signed by Isaias Cowman MD Signature Date/Time: 10/07/2020/1:14:57 PM    Final      Medications   Scheduled Meds: . aspirin EC  81 mg Oral QHS  . cholecalciferol  1,000 Units Oral Daily  . enoxaparin (LOVENOX) injection  40 mg Subcutaneous Q24H  . finasteride  5 mg Oral Daily  . isosorbide mononitrate  30 mg Oral Daily  . levothyroxine  50 mcg Oral QAC breakfast  . multivitamin-lutein  1 capsule Oral Daily  . pantoprazole  20 mg Oral Daily  . rOPINIRole  1 mg Oral TID  . simvastatin  40 mg Oral QHS  . sodium chloride flush  3 mL Intravenous Q12H  . vitamin B-12  500 mcg Oral BH-q7a   Continuous Infusions: . cefTRIAXone (ROCEPHIN)  IV Stopped (10/07/20 1553)       LOS: 2 days    Time spent: 30 minutes with greater than 50% spent at bedside and in coordination of care    Ezekiel Slocumb, DO [facility]  10/08/2020, 3:54 PM    If 7PM-7AM, please contact night-coverage. How to contact the [facility] Attending or Consulting provider [place] or covering provider during after hours [place], for this patient?    1. Check the care team in [facility] and look for a) attending/consulting [facility] provider listed and b) the [facility] team listed 2. Log into [email or web address] and use [place]'s universal password to access. If you do not have the password, please contact the hospital  operator. 3. Locate the [facility] provider you are looking for under [facility] and page to a number that you can be directly reached. 4. If you still have difficulty reaching the provider, please page the [facility] (Director on Call) for the Hospitalists listed on amion for assistance.

## 2020-10-08 NOTE — ED Notes (Signed)
Pt attempted to void in urinal however unsuccessful - assistance provided with in and out cath at this time per pt request.  Pt denies any additional questions concerns needs; will continue to monitor for acute changes and maintain plan of care.

## 2020-10-08 NOTE — ED Notes (Addendum)
Pt was weaned back to 1 L [place] and was maintaining 94-96%, pt taken off the O2 at this time.. will continue to monitor the pt . Pt is sitting up eating breakfast at present.

## 2020-10-09 LAB — BASIC METABOLIC PANEL
Anion gap: 8 (ref 5–15)
BUN: 16 mg/dL (ref 8–23)
CO2: 24 mmol/L (ref 22–32)
Calcium: 7.7 mg/dL — ABNORMAL LOW (ref 8.9–10.3)
Chloride: 104 mmol/L (ref 98–111)
Creatinine, Ser: 0.7 mg/dL (ref 0.61–1.24)
GFR, Estimated: >60 mL/min (ref >60)
Glucose, Bld: 99 mg/dL (ref 70–99)
Potassium: 3.6 mmol/L (ref 3.5–5.1)
Sodium: 136 mmol/L (ref 135–145)

## 2020-10-09 LAB — LACTIC ACID, PLASMA: Lactic Acid, Venous: 1.4 mmol/L (ref 0.5–1.9)

## 2020-10-09 LAB — GLUCOSE, CAPILLARY: Glucose-Capillary: 89 mg/dL (ref 70–99)

## 2020-10-09 MED ORDER — CEFDINIR 300 MG PO CAPS: 300.0000 mg | ORAL_CAPSULE | Freq: Two times a day (BID) | ORAL | 0 refills | Status: AC

## 2020-10-09 NOTE — TOC Transition Note (Signed)
Transition of Care [facility]) - CM/SW Discharge Note   Patient Details  Name: Jason Murphy MRN: [id number] Date of Birth: [birth year]  [facility] CM/SW Contact:  Kerin Salen, RN Phone Number: 10/09/2020, 11:51 AM   Clinical Narrative: Patient eager for discharge, rolling walker in room, provided by [employer]. Barriers resolved.      Final next level of care: Assisted Living Barriers to Discharge: Barriers Resolved   Patient Goals and CMS Choice Patient states their goals for this hospitalization and ongoing recovery are:: To return to ALF   Choice offered to / list presented to : NA  Discharge Placement                Patient to be transferred to facility by: Daughter Sydnee Cabal Name of family member notified: Daughter Patient and family notified of of transfer: 10/09/20  Discharge Plan and Services                DME Arranged: Gilford Rile rolling DME Agency: [employer] Date DME Agency Contacted: 10/09/20   Representative spoke with at DME Agency: [facility] Arranged: PT [place]: Well [place] Date [place]: 10/09/20 Time [place]: 1150 Representative spoke with at [place]: [place] ([place]) Interventions     Readmission Risk Interventions No flowsheet data found.

## 2020-10-09 NOTE — Care Management Important Message (Signed)
Important Message  Patient Details  Name: Jason Murphy MRN: [id number] Date of Birth: [birth year]   Medicare Important Message Given:  Yes     Anselm Pancoast, RN 10/09/2020, 10:39 AM

## 2020-10-09 NOTE — Discharge Summary (Signed)
Physician Discharge Summary  Jason Murphy [id number] DOB: 08-Oct-1920 DOA: 10/06/2020  PCP: Baxter Hire, MD  Admit date: 10/06/2020 Discharge date: 10/09/2020  Admitted From: ALF Disposition:  ALF  Recommendations for Outpatient Follow-up:  1. Follow up with PCP in 1-2 weeks 2. Please obtain BMP/CBC in one week   Home Health: PT  Equipment/Devices: Rolling walker   Discharge Condition: Stable  CODE STATUS: Full  Diet recommendation: Heart Healthy    Discharge Diagnoses: Active Problems:   Sepsis ([place])    Summary of HPI and Hospital Course:  Jason Murphy is a 86 y.o. male with medical history significant of metastatic prostate cancer on hormone therapy (status post 2 doses, next dose March 2022), paroxysmal A. fib on aspirin, BPH with chronic urinary retention (patient does self catheterization every night before bedtime), HTN, chronic ambulatory dysfunction, presented with syncope.  This occurred while seated at table for breakfast and pt denied any prodrome of symptoms beforehand.      Evaluation in the ED revealed a urinary tract infection.  Admitted for further evaluation and IV antibiotics pending cultures.    Severe Sepsis secondary to UTI - sepsis POA as evidenced by hypotension, fever, tachypnea in setting of UTI Altered mentation and lactic acidosis reflects organ dysfunction. --Treated with empiric Rocephin  --Transitioned to [facility] with 4 days to complete course after discharge. --blood cultures negative to date  Syncope - suspect due to hypotension in setting of sepsis.   Lack of prodrome little more concerning for cardiac arrhythmia however.   Has hx of pAF, not on rate control med. Patient had a stress test and outpatient echo with [facility] in March 2021 showed LVEF 57%,but echocardiogram showed LVEF 45%?Moderate mitral regurgitation. --monitored on telemetry without notable events --Orthostatics were negative --PT recommended HH PT which was  arranged for d/c   Paroxysmal A-Fib - rate controlled.  Not on rate control med. --continue aspirin  Prostate Cancer - outpt follow-up with oncology for hormone therapy  BPH withchronic urinary retention - self caths at bedtime - order placed "self cath HS"  LBBB - follow echo and consider cardiology consult if EF reduced.  Prior recent cardiology evaluation as above.   Discharge Instructions   Discharge Instructions    (HEART FAILURE PATIENTS) Call MD:  Anytime you have any of the following symptoms: 1) 3 pound weight gain in 24 hours or 5 pounds in 1 week 2) shortness of breath, with or without a dry hacking cough 3) swelling in the hands, feet or stomach 4) if you have to sleep on extra pillows at night in order to breathe.   Complete by: As directed    Call MD for:  extreme fatigue   Complete by: As directed    Call MD for:  persistant dizziness or light-headedness   Complete by: As directed    Call MD for:  persistant nausea and vomiting   Complete by: As directed    Call MD for:  severe uncontrolled pain   Complete by: As directed    Call MD for:  temperature >100.4   Complete by: As directed    Diet - low sodium heart healthy   Complete by: As directed    Discharge instructions   Complete by: As directed    Please take antibiotic (cefdinir aka Omnicef) twice daily for 4 more days for your UTI.  If you start having fever/chills, worsening weakness or fatigue, or worsening confusion, pain with urination, please contact your doctor or  come to the ER for evaluation as these are signs of recurrent infection.   Increase activity slowly   Complete by: As directed      Allergies as of 10/09/2020   No Known Allergies     Medication List    STOP taking these medications   oxyCODONE-acetaminophen 5-325 MG tablet Commonly known as: PERCOCET/ROXICET     TAKE these medications   AMBULATORY NON FORMULARY MEDICATION Trimix (30/1/10)-(Pap/Phent/PGE)  Test Dose  36ml  vial   Qty #3 Refills 0  [facility] [phone] Fax [phone]   aspirin EC 81 MG tablet Take 81 mg by mouth at bedtime.   cefdinir 300 MG capsule Commonly known as: OMNICEF Take 1 capsule (300 mg total) by mouth 2 (two) times daily for 4 days.   co-enzyme Q-10 50 MG capsule Take 100 mg by mouth every morning.   finasteride 5 MG tablet Commonly known as: PROSCAR Take 5 mg by mouth daily.   isosorbide mononitrate 30 MG 24 hr tablet Commonly known as: IMDUR Take 30 mg by mouth daily.   lansoprazole 15 MG capsule Commonly known as: PREVACID Take 15-30 mg by mouth See admin instructions. Take 1 capsule by mouth every other day alternating with 2 capsules (30mg ) by mouth every other day.   levothyroxine 50 MCG tablet Commonly known as: SYNTHROID Take 50 mcg by mouth daily before breakfast.   meclizine 25 MG tablet Commonly known as: ANTIVERT Take 25 mg by mouth 2 (two) times daily as needed for dizziness.   nitroGLYCERIN 0.4 MG SL tablet Commonly known as: NITROSTAT Place 0.4 mg under the tongue every 5 (five) minutes as needed for chest pain.   PRESERVISION AREDS 2 PO Take 1 tablet by mouth daily.   rOPINIRole 1 MG tablet Commonly known as: REQUIP Take 1 mg by mouth 3 (three) times daily.   sildenafil 25 MG tablet Commonly known as: VIAGRA Take by mouth.   simvastatin 40 MG tablet Commonly known as: ZOCOR TAKE 1 TABLET BY MOUTH EVERY NIGHT AT BEDTIME What changed: when to take this   vitamin B-12 500 MCG tablet Commonly known as: CYANOCOBALAMIN Take 500 mcg by mouth every morning.   Vitamin D3 25 MCG (1000 UT) Caps Take 1,000 Units by mouth daily.            Durable Medical Equipment  (From admission, onward)         Start     Ordered   10/09/20 0923  For home use only DME Walker rolling  Once       Question Answer Comment  Walker: With [place] Wheels   Patient needs a walker to treat with the following condition Unsteady gait       10/09/20 [id number]          Follow-up Information    Baxter Hire, MD. Schedule an appointment as soon as possible for a visit in 1 week(s).   Specialty: Internal Medicine Why: Hospital follow up for sepsis due to UTI Contact information: [place] [address] [phone]              No Known Allergies  Consultations:  None    Procedures/Studies: DG Chest 2 View  Result Date: 10/07/2020 CLINICAL DATA:  Sepsis EXAM: CHEST - 2 VIEW COMPARISON:  10/06/2020 and prior. FINDINGS: No pneumothorax or pleural effusion. Diffuse interstitial prominence with hazy right greater than left pulmonary opacities, unchanged. Stable cardiomediastinal silhouette. No acute osseous abnormality. IMPRESSION: Unchanged  appearance of interstitial prominence and hazy bilateral pulmonary opacities. Electronically Signed   By: Primitivo Gauze M.D.   On: 10/07/2020 08:06   DG Chest Port 1 View  Result Date: 10/08/2020 CLINICAL DATA:  Hypoxia, history of prostate cancer EXAM: PORTABLE CHEST 1 VIEW COMPARISON:  10/07/2020 FINDINGS: Chronic interstitial changes with superimposed ill-defined increased density, right greater than left. Slight worsening of lung aeration for example at the right base. No pleural effusion or pneumothorax. Foci of osseous sclerosis reflecting known metastatic disease. IMPRESSION: Persistent ill-defined opacities, right greater than left, with slight worsening of lung aeration since 10/07/2020. Electronically Signed   By: Macy Mis M.D.   On: 10/08/2020 08:05   DG Chest Port 1 View  Result Date: 10/06/2020 CLINICAL DATA:  Cough.  Weakness.  Syncope. EXAM: PORTABLE CHEST 1 VIEW COMPARISON:  06/23/2020.  Bone scan 8 18 2021.  CT 06/03/2020. FINDINGS: Mediastinum and hilar structures normal. Heart size normal. Prominent skin folds noted on the right. Mild infiltrate right mid lung cannot be excluded. No pleural effusion or pneumothorax. Multiple  blastic bony densities are again noted throughout the chest wall consistent with metastatic disease. IMPRESSION: 1. Prominent skin folds noted on the right. Mild infiltrate right mid lung cannot be excluded. 2. Multiple blastic bony densities again noted throughout the chest wall consistent with metastatic disease. Electronically Signed   By: Marcello Moores  Register   On: 10/06/2020 09:21   ECHOCARDIOGRAM COMPLETE  Result Date: 10/07/2020    ECHOCARDIOGRAM REPORT   Patient Name:   Jason Murphy Date of Exam: 10/07/2020 Medical Rec #:  [id number]      Height:       70.0 in Accession #:    [id number]     Weight:       150.0 lb Date of Birth:  Jun 23, 1920       BSA:          1.847 m Patient Age:    61 years       BP:           131/73 mmHg Patient Gender: M              HR:           58 bpm. Exam Location:  [facility] Procedure: 2D Echo, Color Doppler and Cardiac Doppler Indications:     R55 Syncope  History:         Patient has prior history of Echocardiogram examinations. CAD,                  HCL, Signs/Symptoms:Syncope; Risk Factors:Sleep Apnea and                  Hypertension.  Sonographer:     Charmayne Sheer RDCS (AE) Referring Phys:  [id number] Lequita Halt Diagnosing Phys: Isaias Cowman MD  Sonographer Comments: Technically challenging study due to limited acoustic windows. Image acquisition challenging due to respiratory motion. IMPRESSIONS  1. Left ventricular ejection fraction, by estimation, is 35 to 40%. The left ventricle has moderately decreased function. The left ventricle has no regional wall motion abnormalities. Left ventricular diastolic parameters were normal.  2. Right ventricular systolic function is normal. The right ventricular size is normal.  3. The mitral valve is normal in structure. Mild mitral valve regurgitation. No evidence of mitral stenosis.  4. The aortic valve is normal in structure. Aortic valve regurgitation is not visualized. No aortic stenosis is present.  5. The inferior vena cava is  normal in  size with greater than 50% respiratory variability, suggesting right atrial pressure of 3 mmHg. FINDINGS  Left Ventricle: Left ventricular ejection fraction, by estimation, is 35 to 40%. The left ventricle has moderately decreased function. The left ventricle has no regional wall motion abnormalities. The left ventricular internal cavity size was normal in size. There is no left ventricular hypertrophy. Left ventricular diastolic parameters were normal. Right Ventricle: The right ventricular size is normal. No increase in right ventricular wall thickness. Right ventricular systolic function is normal. Left Atrium: Left atrial size was normal in size. Right Atrium: Right atrial size was normal in size. Pericardium: There is no evidence of pericardial effusion. Mitral Valve: The mitral valve is normal in structure. Mild mitral valve regurgitation. No evidence of mitral valve stenosis. MV peak gradient, 3.1 mmHg. The mean mitral valve gradient is 1.0 mmHg. Tricuspid Valve: The tricuspid valve is normal in structure. Tricuspid valve regurgitation is mild . No evidence of tricuspid stenosis. Aortic Valve: The aortic valve is normal in structure. Aortic valve regurgitation is not visualized. No aortic stenosis is present. Aortic valve mean gradient measures 4.0 mmHg. Aortic valve peak gradient measures 8.0 mmHg. Aortic valve area, by VTI measures 2.10 cm. Pulmonic Valve: The pulmonic valve was normal in structure. Pulmonic valve regurgitation is not visualized. No evidence of pulmonic stenosis. Aorta: The aortic root is normal in size and structure. Venous: The inferior vena cava is normal in size with greater than 50% respiratory variability, suggesting right atrial pressure of 3 mmHg. IAS/Shunts: No atrial level shunt detected by color flow Doppler.  LEFT VENTRICLE PLAX 2D LVIDd:         4.39 cm  Diastology LVIDs:         3.89 cm  LV e' medial:    5.44 cm/s LV PW:         1.03 cm  LV E/e' medial:  10.1 LV  IVS:        0.93 cm  LV e' lateral:   9.90 cm/s LVOT diam:     2.50 cm  LV E/e' lateral: 5.5 LV SV:         66 LV SV Index:   36 LVOT Area:     4.91 cm  AORTIC VALVE AV Area (Vmax):    2.48 cm AV Area (Vmean):   2.31 cm AV Area (VTI):     2.10 cm AV Vmax:           141.00 cm/s AV Vmean:          99.400 cm/s AV VTI:            0.313 m AV Peak Grad:      8.0 mmHg AV Mean Grad:      4.0 mmHg LVOT Vmax:         71.10 cm/s LVOT Vmean:        46.700 cm/s LVOT VTI:          0.134 m LVOT/AV VTI ratio: 0.43  AORTA Ao Root diam: 3.50 cm MITRAL VALVE MV Area (PHT): 2.67 cm    SHUNTS MV Peak grad:  3.1 mmHg    Systemic VTI:  0.13 m MV Mean grad:  1.0 mmHg    Systemic Diam: 2.50 cm MV Vmax:       0.88 m/s MV Vmean:      49.8 cm/s MV Decel Time: 284 msec MV E velocity: 54.80 cm/s MV A velocity: 77.10 cm/s MV E/A ratio:  0.71 Isaias Cowman MD Electronically  signed by Isaias Cowman MD Signature Date/Time: 10/07/2020/1:14:57 PM    Final          Subjective: Pt seen this AM at bedside.  He is ready to go home and asks that I promise he can go later.  No fever or chills, chest pain or SOB.  No acute events.   Discharge Exam: Vitals:   10/09/20 0514 10/09/20 0803  BP: (!) 119/58 139/60  Pulse: (!) 57 (!) 58  Resp:  19  Temp: 98.4 F (36.9 C) 98.3 F (36.8 C)  SpO2: 90% 94%   Vitals:   10/08/20 1634 10/09/20 0514 10/09/20 0602 10/09/20 0803  BP: (!) 141/66 (!) 119/58  139/60  Pulse: (!) 58 (!) 57  (!) 58  Resp: 17   19  Temp: 98.3 F (36.8 C) 98.4 F (36.9 C)  98.3 F (36.8 C)  TempSrc: Oral Oral  Oral  SpO2: 94% 90%  94%  Weight:   71.1 kg   Height:        General: Pt is alert, awake, not in acute distress Cardiovascular: RRR, S1/S2 +, no rubs, no gallops Respiratory: CTA bilaterally, no wheezing, no rhonchi Abdominal: Soft, NT, ND, bowel sounds + Extremities: no edema, no cyanosis    The results of significant diagnostics from this hospitalization (including imaging,  microbiology, ancillary and laboratory) are listed below for reference.     Microbiology: Recent Results (from the past 240 hour(s))  Resp Panel by RT-PCR (Flu A&B, Covid) Nasopharyngeal Swab     Status: None   Collection Time: 10/06/20  9:28 AM   Specimen: Nasopharyngeal Swab; Nasopharyngeal(NP) swabs in vial transport medium  Result Value Ref Range Status   SARS Coronavirus 2 by RT PCR NEGATIVE NEGATIVE Final    Comment: (NOTE) SARS-CoV-2 target nucleic acids are NOT DETECTED.  The SARS-CoV-2 RNA is generally detectable in upper respiratory specimens during the acute phase of infection. The lowest concentration of SARS-CoV-2 viral copies this assay can detect is 138 copies/mL. A negative result does not preclude SARS-Cov-2 infection and should not be used as the sole basis for treatment or other patient management decisions. A negative result may occur with  improper specimen collection/handling, submission of specimen other than nasopharyngeal swab, presence of viral mutation(s) within the areas targeted by this assay, and inadequate number of viral copies(<138 copies/mL). A negative result must be combined with clinical observations, patient history, and epidemiological information. The expected result is Negative.  Fact Sheet for Patients:  EntrepreneurPulse.com.au  Fact Sheet for Healthcare Providers:  IncredibleEmployment.be  This test is no t yet approved or cleared by the [place] FDA and  has been authorized for detection and/or diagnosis of SARS-CoV-2 by FDA under an Emergency Use Authorization (EUA). This EUA will remain  in effect (meaning this test can be used) for the duration of the COVID-19 declaration under Section 564(b)(1) of the Act, 21 U.S.C.section 360bbb-3(b)(1), unless the authorization is terminated  or revoked sooner.       Influenza A by PCR NEGATIVE NEGATIVE Final   Influenza B by PCR NEGATIVE NEGATIVE  Final    Comment: (NOTE) The Xpert Xpress SARS-CoV-2/FLU/RSV plus assay is intended as an aid in the diagnosis of influenza from Nasopharyngeal swab specimens and should not be used as a sole basis for treatment. Nasal washings and aspirates are unacceptable for Xpert Xpress SARS-CoV-2/FLU/RSV testing.  Fact Sheet for Patients: EntrepreneurPulse.com.au  Fact Sheet for Healthcare Providers: IncredibleEmployment.be  This test is not yet approved or cleared  by the [place] and has been authorized for detection and/or diagnosis of SARS-CoV-2 by FDA under an Emergency Use Authorization (EUA). This EUA will remain in effect (meaning this test can be used) for the duration of the COVID-19 declaration under Section 564(b)(1) of the Act, 21 U.S.C. section 360bbb-3(b)(1), unless the authorization is terminated or revoked.  Performed at [facility], [address] [place], [place] [address]   Blood culture (routine x 2)     Status: None (Preliminary result)   Collection Time: 10/06/20  3:01 PM   Specimen: BLOOD  Result Value Ref Range Status   Specimen Description BLOOD RIGHT ANTECUBITAL  Final   Special Requests   Final    BOTTLES DRAWN AEROBIC AND ANAEROBIC Blood Culture results may not be optimal due to an inadequate volume of blood received in culture bottles   Culture   Final    NO GROWTH 3 DAYS Performed at [facility], [address], [place], [place] [address]    Report Status PENDING  Incomplete  Blood culture (routine x 2)     Status: None (Preliminary result)   Collection Time: 10/06/20  3:11 PM   Specimen: BLOOD  Result Value Ref Range Status   Specimen Description BLOOD BLOOD RIGHT HAND  Final   Special Requests   Final    BOTTLES DRAWN AEROBIC AND ANAEROBIC Blood Culture results may not be optimal due to an excessive volume of blood received in culture bottles   Culture   Final    NO GROWTH 3  DAYS Performed at [facility], [address], [place], [place] [address]    Report Status PENDING  Incomplete  Urine culture     Status: Abnormal   Collection Time: 10/06/20  5:10 PM   Specimen: Urine, Random  Result Value Ref Range Status   Specimen Description   Final    URINE, RANDOM Performed at [facility], [address], [place], [place] [address]    Special Requests   Final    NONE Performed at [facility], [address] [place], [place] [address]    Culture (A)  Final    <10,000 COLONIES/mL INSIGNIFICANT GROWTH Performed at [facility], [place] [address], [place], [place] [address]    Report Status 10/07/2020 FINAL  Final     Labs: BNP (last 3 results) Recent Labs    10/08/20 0802  BNP [id number]*   Basic Metabolic Panel: Recent Labs  Lab 10/06/20 0928 10/07/20 0642 10/08/20 0454 10/09/20 0516  NA 140 139 139 136  K 3.7 3.8 3.6 3.6  CL 107 111 110 104  CO2 24 21* 23 24  GLUCOSE 121* 114* 95 99  BUN 22 21 16 16   CREATININE 0.99 0.77 0.78 0.70  CALCIUM 8.5* 7.8* 8.0* 7.7*  MG  --   --  1.8  --    Liver Function Tests: Recent Labs  Lab 10/06/20 0928  AST 29  ALT 11  ALKPHOS 168*  BILITOT 1.0  PROT 6.6  ALBUMIN 3.4*   Recent Labs  Lab 10/06/20 0928  LIPASE 26   No results for input(s): AMMONIA in the last 168 hours. CBC: Recent Labs  Lab 10/06/20 0928 10/07/20 0642 10/08/20 0454  WBC 7.5 10.6* 7.1  NEUTROABS 4.3  --   --   HGB 11.8* 9.8* 10.5*  HCT 37.4* 30.5* 33.0*  MCV 98.2 97.8 98.5  PLT 280 207 203   Cardiac Enzymes: No results  for input(s): CKTOTAL, CKMB, CKMBINDEX, TROPONINI in the last 168 hours. BNP: Invalid input(s): POCBNP CBG: Recent Labs  Lab 10/07/20 0650 10/08/20 0444 10/09/20 0515  GLUCAP 97 114* 89   D-Dimer No results for input(s): DDIMER in the last 72 hours. Hgb A1c No results for input(s): HGBA1C in the last 72 hours. Lipid Profile No results for input(s): CHOL,  HDL, LDLCALC, TRIG, CHOLHDL, LDLDIRECT in the last 72 hours. Thyroid function studies Recent Labs    10/06/20 1503  TSH 1.284   Anemia work up No results for input(s): VITAMINB12, FOLATE, FERRITIN, TIBC, IRON, RETICCTPCT in the last 72 hours. Urinalysis    Component Value Date/Time   COLORURINE YELLOW (A) 10/06/2020 0929   APPEARANCEUR TURBID (A) 10/06/2020 0929   APPEARANCEUR Cloudy (A) 06/12/2020 1143   LABSPEC 1.011 10/06/2020 0929   LABSPEC 1.015 01/02/2012 2312   PHURINE 6.0 10/06/2020 0929   GLUCOSEU NEGATIVE 10/06/2020 0929   GLUCOSEU Negative 01/02/2012 2312   HGBUR SMALL (A) 10/06/2020 0929   BILIRUBINUR NEGATIVE 10/06/2020 0929   BILIRUBINUR Negative 06/12/2020 1143   BILIRUBINUR Negative 01/02/2012 2312   KETONESUR NEGATIVE 10/06/2020 0929   PROTEINUR 30 (A) 10/06/2020 0929   NITRITE NEGATIVE 10/06/2020 0929   LEUKOCYTESUR LARGE (A) 10/06/2020 0929   LEUKOCYTESUR Negative 01/02/2012 2312   Sepsis Labs Invalid input(s): PROCALCITONIN,  WBC,  LACTICIDVEN Microbiology Recent Results (from the past 240 hour(s))  Resp Panel by RT-PCR (Flu A&B, Covid) Nasopharyngeal Swab     Status: None   Collection Time: 10/06/20  9:28 AM   Specimen: Nasopharyngeal Swab; Nasopharyngeal(NP) swabs in vial transport medium  Result Value Ref Range Status   SARS Coronavirus 2 by RT PCR NEGATIVE NEGATIVE Final    Comment: (NOTE) SARS-CoV-2 target nucleic acids are NOT DETECTED.  The SARS-CoV-2 RNA is generally detectable in upper respiratory specimens during the acute phase of infection. The lowest concentration of SARS-CoV-2 viral copies this assay can detect is 138 copies/mL. A negative result does not preclude SARS-Cov-2 infection and should not be used as the sole basis for treatment or other patient management decisions. A negative result may occur with  improper specimen collection/handling, submission of specimen other than nasopharyngeal swab, presence of viral mutation(s)  within the areas targeted by this assay, and inadequate number of viral copies(<138 copies/mL). A negative result must be combined with clinical observations, patient history, and epidemiological information. The expected result is Negative.  Fact Sheet for Patients:  EntrepreneurPulse.com.au  Fact Sheet for Healthcare Providers:  IncredibleEmployment.be  This test is no t yet approved or cleared by the [place] FDA and  has been authorized for detection and/or diagnosis of SARS-CoV-2 by FDA under an Emergency Use Authorization (EUA). This EUA will remain  in effect (meaning this test can be used) for the duration of the COVID-19 declaration under Section 564(b)(1) of the Act, 21 U.S.C.section 360bbb-3(b)(1), unless the authorization is terminated  or revoked sooner.       Influenza A by PCR NEGATIVE NEGATIVE Final   Influenza B by PCR NEGATIVE NEGATIVE Final    Comment: (NOTE) The Xpert Xpress SARS-CoV-2/FLU/RSV plus assay is intended as an aid in the diagnosis of influenza from Nasopharyngeal swab specimens and should not be used as a sole basis for treatment. Nasal washings and aspirates are unacceptable for Xpert Xpress SARS-CoV-2/FLU/RSV testing.  Fact Sheet for Patients: EntrepreneurPulse.com.au  Fact Sheet for Healthcare Providers: IncredibleEmployment.be  This test is not yet approved or cleared by the [place] FDA and has  been authorized for detection and/or diagnosis of SARS-CoV-2 by FDA under an Emergency Use Authorization (EUA). This EUA will remain in effect (meaning this test can be used) for the duration of the COVID-19 declaration under Section 564(b)(1) of the Act, 21 U.S.C. section 360bbb-3(b)(1), unless the authorization is terminated or revoked.  Performed at [facility], [address] [place], [place] [address]   Blood culture (routine x 2)     Status: None  (Preliminary result)   Collection Time: 10/06/20  3:01 PM   Specimen: BLOOD  Result Value Ref Range Status   Specimen Description BLOOD RIGHT ANTECUBITAL  Final   Special Requests   Final    BOTTLES DRAWN AEROBIC AND ANAEROBIC Blood Culture results may not be optimal due to an inadequate volume of blood received in culture bottles   Culture   Final    NO GROWTH 3 DAYS Performed at [facility], [address] [place], [place] [address]    Report Status PENDING  Incomplete  Blood culture (routine x 2)     Status: None (Preliminary result)   Collection Time: 10/06/20  3:11 PM   Specimen: BLOOD  Result Value Ref Range Status   Specimen Description BLOOD BLOOD RIGHT HAND  Final   Special Requests   Final    BOTTLES DRAWN AEROBIC AND ANAEROBIC Blood Culture results may not be optimal due to an excessive volume of blood received in culture bottles   Culture   Final    NO GROWTH 3 DAYS Performed at [facility], [address] [place], [place] [address]    Report Status PENDING  Incomplete  Urine culture     Status: Abnormal   Collection Time: 10/06/20  5:10 PM   Specimen: Urine, Random  Result Value Ref Range Status   Specimen Description   Final    URINE, RANDOM Performed at [facility], [address] [place], [place] [address]    Special Requests   Final    NONE Performed at [facility], [address], [place], [place] [address]    Culture (A)  Final    <10,000 COLONIES/mL INSIGNIFICANT GROWTH Performed at [facility], [place] [address], [place],  [address]    Report Status 10/07/2020 FINAL  Final     Time coordinating discharge: Over 30 minutes  SIGNED:   Ezekiel Slocumb, DO [facility] 10/09/2020, 9:55 AM   If 7PM-7AM, please contact night-coverage [email or web address]

## 2020-10-09 NOTE — Plan of Care (Signed)
No acute events overnight. Pt self-catheterized himself before bed. Asking for possible discharge update. On room air, O2 >90% throughout shift. Will continue to monitor.   Problem: Education: Goal: Knowledge of General Education information will improve Description: Including pain rating scale, medication(s)/side effects and non-pharmacologic comfort measures Outcome: Progressing   Problem: Health Behavior/Discharge Planning: Goal: Ability to manage health-related needs will improve Outcome: Progressing   Problem: Clinical Measurements: Goal: Ability to maintain clinical measurements within normal limits will improve Outcome: Progressing Goal: Will remain free from infection Outcome: Progressing Goal: Diagnostic test results will improve Outcome: Progressing Goal: Respiratory complications will improve Outcome: Progressing Goal: Cardiovascular complication will be avoided Outcome: Progressing   Problem: Activity: Goal: Risk for activity intolerance will decrease Outcome: Progressing   Problem: Nutrition: Goal: Adequate nutrition will be maintained Outcome: Progressing   Problem: Coping: Goal: Level of anxiety will decrease Outcome: Progressing   Problem: Elimination: Goal: Will not experience complications related to bowel motility Outcome: Progressing Goal: Will not experience complications related to urinary retention Outcome: Progressing   Problem: Pain Managment: Goal: General experience of comfort will improve Outcome: Progressing   Problem: Safety: Goal: Ability to remain free from injury will improve Outcome: Progressing   Problem: Skin Integrity: Goal: Risk for impaired skin integrity will decrease Outcome: Progressing

## 2020-10-09 NOTE — TOC Initial Note (Signed)
Transition of Care [facility]) - Initial/Assessment Note    Patient Details  Name: Jason Murphy MRN: [id number] Date of Birth: 1920-05-04  Transition [facility]) CM/SW Contact:    Anselm Pancoast, RN Phone Number: 10/09/2020, 10:39 AM  Clinical Narrative:                 Spoke with patient at bedside during leader rounding. Patient reports he is from [facility] and hopes to return today. Anticipating will need home health due to weakness in legs. Patient concerned about transportation back to [facility]. TOC will assist with discharge.         Patient Goals and CMS Choice        Expected Discharge Plan and Services           Expected Discharge Date: 10/09/20                                    Prior Living Arrangements/Services                       Activities of Daily Living Home Assistive Devices/Equipment: None ADL Screening (condition at time of admission) Patient's cognitive ability adequate to safely complete daily activities?: Yes Is the patient deaf or have difficulty hearing?: No Does the patient have difficulty seeing, even when wearing glasses/contacts?: No Does the patient have difficulty concentrating, remembering, or making decisions?: Yes Patient able to express need for assistance with ADLs?: Yes Does the patient have difficulty dressing or bathing?: No Independently performs ADLs?: Yes (appropriate for developmental age) Does the patient have difficulty walking or climbing stairs?: Yes Weakness of Legs: Both Weakness of Arms/Hands: None  Permission Sought/Granted                  Emotional Assessment              Admission diagnosis:  Hypoxia [R09.02] Acute cystitis without hematuria [N30.00] Sepsis ([place]) [A41.9] Syncope, unspecified syncope type [R55] Sepsis, due to unspecified organism, unspecified whether acute organ dysfunction present [facility]) [A41.9] Patient Active Problem List   Diagnosis Date Noted  . Sepsis  ([place]) 10/06/2020  . Syncope   . Acute cystitis without hematuria   . Goals of care, counseling/discussion 08/15/2020  . Hypertension   . Coronary artery disease   . RLS (restless legs syndrome) 09/21/2017  . Paroxysmal A-fib ([place]) 01/08/2016  . Mixed hyperlipidemia 01/08/2016  . Hyponatremia 01/03/2016  . Anxiety state 02/03/2015  . Stress at home 02/03/2015  . OBSTRUCTIVE SLEEP APNEA 10/22/2010  . PROSTATE CANCER 10/21/2010  . HYPERCHOLESTEROLEMIA 10/21/2010  . HYPERTENSION 10/21/2010  . CORONARY ARTERY DISEASE 10/21/2010  . DIZZINESS, CHRONIC 10/21/2010   PCP:  Baxter Hire, MD Pharmacy:   [facility] [id number], [place] AT [address] [place] [address] Phone: [phone] Fax: [phone]     Social Determinants of Health (SDOH) Interventions    Readmission Risk Interventions No flowsheet data found.

## 2020-10-11 LAB — CULTURE, BLOOD (ROUTINE X 2)
Culture: NO GROWTH
Culture: NO GROWTH

## 2020-10-12 DIAGNOSIS — G4733 Obstructive sleep apnea (adult) (pediatric): Secondary | ICD-10-CM | POA: Diagnosis not present

## 2020-10-12 DIAGNOSIS — I10 Essential (primary) hypertension: Secondary | ICD-10-CM | POA: Diagnosis not present

## 2020-10-12 DIAGNOSIS — I447 Left bundle-branch block, unspecified: Secondary | ICD-10-CM | POA: Diagnosis not present

## 2020-10-12 DIAGNOSIS — C61 Malignant neoplasm of prostate: Secondary | ICD-10-CM | POA: Diagnosis not present

## 2020-10-12 DIAGNOSIS — I480 Paroxysmal atrial fibrillation: Secondary | ICD-10-CM | POA: Diagnosis not present

## 2020-10-12 DIAGNOSIS — G2581 Restless legs syndrome: Secondary | ICD-10-CM | POA: Diagnosis not present

## 2020-10-12 DIAGNOSIS — I2510 Atherosclerotic heart disease of native coronary artery without angina pectoris: Secondary | ICD-10-CM | POA: Diagnosis not present

## 2020-10-12 DIAGNOSIS — N3000 Acute cystitis without hematuria: Secondary | ICD-10-CM | POA: Diagnosis not present

## 2020-10-12 DIAGNOSIS — E782 Mixed hyperlipidemia: Secondary | ICD-10-CM | POA: Diagnosis not present

## 2020-10-13 DIAGNOSIS — I480 Paroxysmal atrial fibrillation: Secondary | ICD-10-CM | POA: Diagnosis not present

## 2020-10-13 DIAGNOSIS — E782 Mixed hyperlipidemia: Secondary | ICD-10-CM | POA: Diagnosis not present

## 2020-10-13 DIAGNOSIS — I10 Essential (primary) hypertension: Secondary | ICD-10-CM | POA: Diagnosis not present

## 2020-10-13 DIAGNOSIS — G2581 Restless legs syndrome: Secondary | ICD-10-CM | POA: Diagnosis not present

## 2020-10-13 DIAGNOSIS — I447 Left bundle-branch block, unspecified: Secondary | ICD-10-CM | POA: Diagnosis not present

## 2020-10-13 DIAGNOSIS — G4733 Obstructive sleep apnea (adult) (pediatric): Secondary | ICD-10-CM | POA: Diagnosis not present

## 2020-10-13 DIAGNOSIS — C61 Malignant neoplasm of prostate: Secondary | ICD-10-CM | POA: Diagnosis not present

## 2020-10-13 DIAGNOSIS — I2510 Atherosclerotic heart disease of native coronary artery without angina pectoris: Secondary | ICD-10-CM | POA: Diagnosis not present

## 2020-10-13 DIAGNOSIS — N3000 Acute cystitis without hematuria: Secondary | ICD-10-CM | POA: Diagnosis not present

## 2020-10-15 DIAGNOSIS — I480 Paroxysmal atrial fibrillation: Secondary | ICD-10-CM | POA: Diagnosis not present

## 2020-10-15 DIAGNOSIS — N3000 Acute cystitis without hematuria: Secondary | ICD-10-CM | POA: Diagnosis not present

## 2020-10-15 DIAGNOSIS — I10 Essential (primary) hypertension: Secondary | ICD-10-CM | POA: Diagnosis not present

## 2020-10-15 DIAGNOSIS — C61 Malignant neoplasm of prostate: Secondary | ICD-10-CM | POA: Diagnosis not present

## 2020-10-15 DIAGNOSIS — E782 Mixed hyperlipidemia: Secondary | ICD-10-CM | POA: Diagnosis not present

## 2020-10-15 DIAGNOSIS — I2510 Atherosclerotic heart disease of native coronary artery without angina pectoris: Secondary | ICD-10-CM | POA: Diagnosis not present

## 2020-10-15 DIAGNOSIS — G2581 Restless legs syndrome: Secondary | ICD-10-CM | POA: Diagnosis not present

## 2020-10-15 DIAGNOSIS — G4733 Obstructive sleep apnea (adult) (pediatric): Secondary | ICD-10-CM | POA: Diagnosis not present

## 2020-10-15 DIAGNOSIS — I447 Left bundle-branch block, unspecified: Secondary | ICD-10-CM | POA: Diagnosis not present

## 2020-10-23 DIAGNOSIS — C61 Malignant neoplasm of prostate: Secondary | ICD-10-CM | POA: Diagnosis not present

## 2020-10-23 DIAGNOSIS — I447 Left bundle-branch block, unspecified: Secondary | ICD-10-CM | POA: Diagnosis not present

## 2020-10-23 DIAGNOSIS — G2581 Restless legs syndrome: Secondary | ICD-10-CM | POA: Diagnosis not present

## 2020-10-23 DIAGNOSIS — N3000 Acute cystitis without hematuria: Secondary | ICD-10-CM | POA: Diagnosis not present

## 2020-10-23 DIAGNOSIS — G4733 Obstructive sleep apnea (adult) (pediatric): Secondary | ICD-10-CM | POA: Diagnosis not present

## 2020-10-23 DIAGNOSIS — I2510 Atherosclerotic heart disease of native coronary artery without angina pectoris: Secondary | ICD-10-CM | POA: Diagnosis not present

## 2020-10-23 DIAGNOSIS — I10 Essential (primary) hypertension: Secondary | ICD-10-CM | POA: Diagnosis not present

## 2020-10-23 DIAGNOSIS — E782 Mixed hyperlipidemia: Secondary | ICD-10-CM | POA: Diagnosis not present

## 2020-10-23 DIAGNOSIS — I480 Paroxysmal atrial fibrillation: Secondary | ICD-10-CM | POA: Diagnosis not present

## 2020-10-31 DIAGNOSIS — C61 Malignant neoplasm of prostate: Secondary | ICD-10-CM | POA: Diagnosis not present

## 2020-10-31 DIAGNOSIS — I2510 Atherosclerotic heart disease of native coronary artery without angina pectoris: Secondary | ICD-10-CM | POA: Diagnosis not present

## 2020-10-31 DIAGNOSIS — G4733 Obstructive sleep apnea (adult) (pediatric): Secondary | ICD-10-CM | POA: Diagnosis not present

## 2020-10-31 DIAGNOSIS — I447 Left bundle-branch block, unspecified: Secondary | ICD-10-CM | POA: Diagnosis not present

## 2020-10-31 DIAGNOSIS — I10 Essential (primary) hypertension: Secondary | ICD-10-CM | POA: Diagnosis not present

## 2020-10-31 DIAGNOSIS — I480 Paroxysmal atrial fibrillation: Secondary | ICD-10-CM | POA: Diagnosis not present

## 2020-10-31 DIAGNOSIS — N3000 Acute cystitis without hematuria: Secondary | ICD-10-CM | POA: Diagnosis not present

## 2020-10-31 DIAGNOSIS — E782 Mixed hyperlipidemia: Secondary | ICD-10-CM | POA: Diagnosis not present

## 2020-10-31 DIAGNOSIS — G2581 Restless legs syndrome: Secondary | ICD-10-CM | POA: Diagnosis not present

## 2020-11-09 DIAGNOSIS — I2510 Atherosclerotic heart disease of native coronary artery without angina pectoris: Secondary | ICD-10-CM | POA: Diagnosis not present

## 2020-11-09 DIAGNOSIS — I447 Left bundle-branch block, unspecified: Secondary | ICD-10-CM | POA: Diagnosis not present

## 2020-11-09 DIAGNOSIS — I480 Paroxysmal atrial fibrillation: Secondary | ICD-10-CM | POA: Diagnosis not present

## 2020-11-09 DIAGNOSIS — G2581 Restless legs syndrome: Secondary | ICD-10-CM | POA: Diagnosis not present

## 2020-11-09 DIAGNOSIS — C61 Malignant neoplasm of prostate: Secondary | ICD-10-CM | POA: Diagnosis not present

## 2020-11-09 DIAGNOSIS — I10 Essential (primary) hypertension: Secondary | ICD-10-CM | POA: Diagnosis not present

## 2020-11-09 DIAGNOSIS — G4733 Obstructive sleep apnea (adult) (pediatric): Secondary | ICD-10-CM | POA: Diagnosis not present

## 2020-11-09 DIAGNOSIS — N3000 Acute cystitis without hematuria: Secondary | ICD-10-CM | POA: Diagnosis not present

## 2020-11-09 DIAGNOSIS — E782 Mixed hyperlipidemia: Secondary | ICD-10-CM | POA: Diagnosis not present

## 2020-11-17 ENCOUNTER — Telehealth: Payer: Self-pay

## 2020-11-17 NOTE — Telephone Encounter (Signed)
Benefits investigation form faxed.

## 2020-11-23 NOTE — Telephone Encounter (Signed)
Incoming [place]lig[place], form, No PA required.

## 2020-12-31 ENCOUNTER — Observation Stay
Admission: EM | Admit: 2020-12-31 | Discharge: 2021-01-02 | Disposition: A | Discharge status: Home / Self Care | Payer: Medicare PPO | Attending: Internal Medicine | Admitting: Internal Medicine

## 2020-12-31 ENCOUNTER — Other Ambulatory Visit: Payer: Self-pay

## 2020-12-31 ENCOUNTER — Emergency Department: Payer: Medicare PPO

## 2020-12-31 DIAGNOSIS — N3000 Acute cystitis without hematuria: Secondary | ICD-10-CM | POA: Diagnosis present

## 2020-12-31 DIAGNOSIS — I2510 Atherosclerotic heart disease of native coronary artery without angina pectoris: Secondary | ICD-10-CM | POA: Diagnosis not present

## 2020-12-31 DIAGNOSIS — E039 Hypothyroidism, unspecified: Secondary | ICD-10-CM | POA: Insufficient documentation

## 2020-12-31 DIAGNOSIS — I509 Heart failure, unspecified: Secondary | ICD-10-CM | POA: Diagnosis not present

## 2020-12-31 DIAGNOSIS — Z8546 Personal history of malignant neoplasm of prostate: Secondary | ICD-10-CM | POA: Insufficient documentation

## 2020-12-31 DIAGNOSIS — R4182 Altered mental status, unspecified: Principal | ICD-10-CM | POA: Diagnosis present

## 2020-12-31 DIAGNOSIS — R2681 Unsteadiness on feet: Secondary | ICD-10-CM | POA: Diagnosis not present

## 2020-12-31 DIAGNOSIS — Z20822 Contact with and (suspected) exposure to covid-19: Secondary | ICD-10-CM | POA: Diagnosis not present

## 2020-12-31 DIAGNOSIS — Z9181 History of falling: Secondary | ICD-10-CM | POA: Insufficient documentation

## 2020-12-31 DIAGNOSIS — I110 Hypertensive heart disease with heart failure: Secondary | ICD-10-CM | POA: Insufficient documentation

## 2020-12-31 DIAGNOSIS — R55 Syncope and collapse: Secondary | ICD-10-CM

## 2020-12-31 DIAGNOSIS — Z7982 Long term (current) use of aspirin: Secondary | ICD-10-CM | POA: Diagnosis not present

## 2020-12-31 DIAGNOSIS — E43 Unspecified severe protein-calorie malnutrition: Secondary | ICD-10-CM | POA: Insufficient documentation

## 2020-12-31 DIAGNOSIS — Z79899 Other long term (current) drug therapy: Secondary | ICD-10-CM | POA: Diagnosis not present

## 2020-12-31 LAB — CBC WITH DIFFERENTIAL/PLATELET
Abs Immature Granulocytes: 0.02 10*3/uL (ref 0.00–0.07)
Basophils Absolute: 0.1 10*3/uL (ref 0.0–0.1)
Basophils Relative: 1 %
Eosinophils Absolute: 0.2 10*3/uL (ref 0.0–0.5)
Eosinophils Relative: 3 %
HCT: 36.1 % — ABNORMAL LOW (ref 39.0–52.0)
Hemoglobin: 11.6 g/dL — ABNORMAL LOW (ref 13.0–17.0)
Immature Granulocytes: 0 %
Lymphocytes Relative: 31 %
Lymphs Abs: 2.3 10*3/uL (ref 0.7–4.0)
MCH: 31 pg (ref 26.0–34.0)
MCHC: 32.1 g/dL (ref 30.0–36.0)
MCV: 96.5 fL (ref 80.0–100.0)
Monocytes Absolute: 0.8 10*3/uL (ref 0.1–1.0)
Monocytes Relative: 10 %
Neutro Abs: 4.2 10*3/uL (ref 1.7–7.7)
Neutrophils Relative %: 55 %
Platelets: 264 10*3/uL (ref 150–400)
RBC: 3.74 MIL/uL — ABNORMAL LOW (ref 4.22–5.81)
RDW: 13.4 % (ref 11.5–15.5)
WBC: 7.5 10*3/uL (ref 4.0–10.5)
nRBC: 0 % (ref 0.0–0.2)

## 2020-12-31 LAB — URINALYSIS, COMPLETE (UACMP) WITH MICROSCOPIC
Bilirubin Urine: NEGATIVE
Glucose, UA: NEGATIVE mg/dL
Ketones, ur: NEGATIVE mg/dL
Nitrite: POSITIVE — AB
Protein, ur: NEGATIVE mg/dL
RBC / HPF: NONE SEEN RBC/hpf (ref 0–5)
Specific Gravity, Urine: 1.015 (ref 1.005–1.030)
Squamous Epithelial / HPF: NONE SEEN (ref 0–5)
pH: 7 (ref 5.0–8.0)

## 2020-12-31 LAB — TROPONIN I (HIGH SENSITIVITY)
Troponin I (High Sensitivity): 7 ng/L (ref <18)
Troponin I (High Sensitivity): 5 ng/L (ref <18)

## 2020-12-31 LAB — BASIC METABOLIC PANEL
Anion gap: 7 (ref 5–15)
BUN: 24 mg/dL — ABNORMAL HIGH (ref 8–23)
CO2: 27 mmol/L (ref 22–32)
Calcium: 9 mg/dL (ref 8.9–10.3)
Chloride: 105 mmol/L (ref 98–111)
Creatinine, Ser: 1.03 mg/dL (ref 0.61–1.24)
GFR, Estimated: >60 mL/min (ref >60)
Glucose, Bld: 126 mg/dL — ABNORMAL HIGH (ref 70–99)
Potassium: 4.1 mmol/L (ref 3.5–5.1)
Sodium: 139 mmol/L (ref 135–145)

## 2020-12-31 LAB — SARS CORONAVIRUS 2 (TAT 6-24 HRS): SARS Coronavirus 2: NEGATIVE

## 2020-12-31 MED ORDER — PRESERVISION AREDS 2 PO CAPS: ORAL_CAPSULE | Freq: Every day | ORAL | Status: DC

## 2020-12-31 MED ORDER — SODIUM CHLORIDE 0.9 % IV SOLN
1.0000 g | Freq: Once | INTRAVENOUS | Status: AC
  Administered 2020-12-31: 1 g via INTRAVENOUS
  Filled 2020-12-31: qty 10

## 2020-12-31 MED ORDER — MECLIZINE HCL 25 MG PO TABS
25.0000 mg | ORAL_TABLET | Freq: Two times a day (BID) | ORAL | Status: DC | PRN
  Filled 2020-12-31: qty 1

## 2020-12-31 MED ORDER — PANTOPRAZOLE SODIUM 20 MG PO TBEC
20.0000 mg | DELAYED_RELEASE_TABLET | Freq: Every day | ORAL | Status: DC
  Administered 2021-01-01 – 2021-01-02 (×2): 20 mg via ORAL
  Filled 2020-12-31 (×2): qty 1

## 2020-12-31 MED ORDER — LACTATED RINGERS IV BOLUS
500.0000 mL | Freq: Once | INTRAVENOUS | Status: AC
  Administered 2020-12-31: 500 mL via INTRAVENOUS

## 2020-12-31 MED ORDER — ISOSORBIDE MONONITRATE ER 60 MG PO TB24
30.0000 mg | ORAL_TABLET | Freq: Every day | ORAL | Status: DC
  Administered 2021-01-01 – 2021-01-02 (×2): 30 mg via ORAL
  Filled 2020-12-31 (×2): qty 1

## 2020-12-31 MED ORDER — SODIUM CHLORIDE 0.9 % IV SOLN
1.0000 g | INTRAVENOUS | Status: DC
  Administered 2021-01-01 – 2021-01-02 (×2): 1 g via INTRAVENOUS
  Filled 2020-12-31: qty 1
  Filled 2020-12-31 (×2): qty 10

## 2020-12-31 MED ORDER — LEVOTHYROXINE SODIUM 50 MCG PO TABS
50.0000 ug | ORAL_TABLET | Freq: Every day | ORAL | Status: DC
  Administered 2021-01-01 – 2021-01-02 (×2): 50 ug via ORAL
  Filled 2020-12-31 (×2): qty 1

## 2020-12-31 MED ORDER — SIMVASTATIN 40 MG PO TABS
40.0000 mg | ORAL_TABLET | Freq: Every day | ORAL | Status: DC
  Administered 2020-12-31 – 2021-01-01 (×2): 40 mg via ORAL
  Filled 2020-12-31 (×3): qty 4

## 2020-12-31 MED ORDER — OCUVITE-LUTEIN PO CAPS
1.0000 | ORAL_CAPSULE | Freq: Every day | ORAL | Status: DC
  Administered 2021-01-01 – 2021-01-02 (×2): 1 via ORAL
  Filled 2020-12-31 (×2): qty 1

## 2020-12-31 MED ORDER — ENOXAPARIN SODIUM 40 MG/0.4ML ~~LOC~~ SOLN
40.0000 mg | SUBCUTANEOUS | Status: DC
  Administered 2020-12-31 – 2021-01-01 (×2): 40 mg via SUBCUTANEOUS
  Filled 2020-12-31 (×2): qty 0.4

## 2020-12-31 MED ORDER — ASPIRIN EC 81 MG PO TBEC
81.0000 mg | DELAYED_RELEASE_TABLET | Freq: Every day | ORAL | Status: DC
  Administered 2020-12-31 – 2021-01-01 (×2): 81 mg via ORAL
  Filled 2020-12-31 (×2): qty 1

## 2020-12-31 MED ORDER — ROPINIROLE HCL 1 MG PO TABS
1.0000 mg | ORAL_TABLET | Freq: Three times a day (TID) | ORAL | Status: DC
  Administered 2020-12-31 – 2021-01-02 (×5): 1 mg via ORAL
  Filled 2020-12-31 (×5): qty 1

## 2020-12-31 NOTE — ED Provider Notes (Signed)
[facility] Emergency Department Provider Note   ____________________________________________   Event Date/Time   First MD Initiated Contact with Patient 12/31/20 0932     (approximate)  I have reviewed the triage vital signs and the nursing notes.   HISTORY  Chief Complaint Dizziness (syn) and Loss of Consciousness    HPI Jason Murphy is a 87 y.o. male with past medical history of hypertension, hyperlipidemia, CAD, paroxysmal A. fib on aspirin, and prostate cancer who presents to the ED for syncope.  Patient states he remembers going to breakfast this morning and feeling lightheaded while seated at the table.  He then does not remember what happened, but EMS reports staff at his assisted living facility was concerned patient lost consciousness.  He had bowel and bladder incontinence and initial blood pressure with EMS was low.  Blood pressure gradually improved without intervention, patient now states he feels better but still is somewhat dizzy.  He denies any chest pain or shortness of breath with this episode, has been feeling well recently with no fevers, cough, dysuria, hematuria, abdominal pain, vomiting, or diarrhea.        Past Medical History:  Diagnosis Date  . Cancer [facility])    prostate  . Coronary artery disease   . Dizziness    chronic dizziness  . Dyspnea   . Fatigue    Progressive fatigue with question of sleep apnea  . Hypercholesterolemia   . Hypertension   . Obstructive sleep apnea   . Pneumonia   . Restless leg syndrome     Patient Active Problem List   Diagnosis Date Noted  . Sepsis ([place]) 10/06/2020  . Syncope   . Acute cystitis without hematuria   . Goals of care, counseling/discussion 08/15/2020  . Hypertension   . Coronary artery disease   . RLS (restless legs syndrome) 09/21/2017  . Paroxysmal A-fib ([place]) 01/08/2016  . Mixed hyperlipidemia 01/08/2016  . Hyponatremia 01/03/2016  . Anxiety state 02/03/2015  .  Stress at home 02/03/2015  . OBSTRUCTIVE SLEEP APNEA 10/22/2010  . PROSTATE CANCER 10/21/2010  . HYPERCHOLESTEROLEMIA 10/21/2010  . HYPERTENSION 10/21/2010  . CORONARY ARTERY DISEASE 10/21/2010  . DIZZINESS, CHRONIC 10/21/2010    Past Surgical History:  Procedure Laterality Date  . APPENDECTOMY    . CARDIAC CATHETERIZATION     Ejection Fraction was 60%, stent  . KNEE SURGERY     arthroscopic left knee surgery  . SHOULDER ARTHROSCOPY      Prior to Admission medications   Medication Sig Start Date End Date Taking? Authorizing Provider  AMBULATORY NON FORMULARY MEDICATION Trimix (30/1/10)-(Pap/Phent/PGE)  Test Dose  57ml vial   Qty #3 [place] [phone] Fax [phone] 04/29/20   Hollice Espy, MD  aspirin EC 81 MG tablet Take 81 mg by mouth at bedtime.    [provider]  Cholecalciferol (VITAMIN D3) 1000 units CAPS Take 1,000 Units by mouth daily.    [provider]  co-enzyme Q-10 50 MG capsule Take 100 mg by mouth every morning.    [provider]  cyanocobalamin 500 MCG tablet Take 500 mcg by mouth every morning.    [provider]  finasteride (PROSCAR) 5 MG tablet Take 5 mg by mouth daily. Patient not taking: Reported on 10/06/2020 04/17/20 04/17/21  [provider]  isosorbide mononitrate (IMDUR) 30 MG 24 hr tablet Take 30 mg by mouth daily. 04/09/20   [provider]  lansoprazole (PREVACID) 15 MG capsule Take  15-30 mg by mouth See admin instructions. Take 1 capsule by mouth every other day alternating with 2 capsules (30mg ) by mouth every other day.    [provider]  levothyroxine (SYNTHROID, LEVOTHROID) 50 MCG tablet Take 50 mcg by mouth daily before breakfast.    [provider]  meclizine (ANTIVERT) 25 MG tablet Take 25 mg by mouth 2 (two) times daily as needed for dizziness.    [provider]  Multiple Vitamins-Minerals (PRESERVISION AREDS 2 PO) Take 1 tablet by  mouth daily.    [provider]  nitroGLYCERIN (NITROSTAT) 0.4 MG SL tablet Place 0.4 mg under the tongue every 5 (five) minutes as needed for chest pain.    [provider]  rOPINIRole (REQUIP) 1 MG tablet Take 1 mg by mouth 3 (three) times daily. 03/10/20   [provider]  sildenafil (VIAGRA) 25 MG tablet Take by mouth. 04/17/20 05/17/20  [provider]  simvastatin (ZOCOR) 40 MG tablet TAKE 1 TABLET BY MOUTH EVERY NIGHT AT BEDTIME Patient taking differently: Take 40 mg by mouth daily at 6 PM. 05/26/11   [place], Peter M, MD  pramipexole (MIRAPEX) 0.5 MG tablet Take 0.5 mg by mouth at bedtime. 09/18/17 03/15/20  [provider]    Allergies Patient has no known allergies.  Family History  Problem Relation Age of Onset  . COPD Mother     Social History Social History   Tobacco Use  . Smoking status: Never Smoker  . Smokeless tobacco: Never Used  Vaping Use  . Vaping Use: Never used  Substance Use Topics  . Alcohol use: Yes    Comment: 1/2 glass wine daily  . Drug use: No    Review of Systems  Constitutional: No fever/chills Eyes: No visual changes. ENT: No sore throat. Cardiovascular: Denies chest pain.  Positive for lightheadedness and syncope. Respiratory: Denies shortness of breath. Gastrointestinal: No abdominal pain.  No nausea, no vomiting.  No diarrhea.  No constipation. Genitourinary: Negative for dysuria. Musculoskeletal: Negative for back pain. Skin: Negative for rash. Neurological: Negative for headaches, focal weakness or numbness.  ____________________________________________   PHYSICAL EXAM:  VITAL SIGNS: ED Triage Vitals [12/31/20 0930]  Enc Vitals Group     BP      Pulse      Resp      Temp      Temp src      SpO2      Weight      Height      Head Circumference      Peak Flow      Pain Score 4     Pain Loc      Pain Edu?      Excl. in [place]?     Constitutional: Alert and oriented to person and  place, but not time. Eyes: Conjunctivae are normal. Head: Atraumatic. Nose: No congestion/rhinnorhea. Mouth/Throat: Mucous membranes are moist. Neck: Normal ROM Cardiovascular: Normal rate, regular rhythm. Grossly normal heart sounds.  2+ radial pulses bilaterally. Respiratory: Normal respiratory effort.  No retractions. Lungs CTAB. Gastrointestinal: Soft and nontender. No distention. Genitourinary: deferred Musculoskeletal: No lower extremity tenderness nor edema. Neurologic:  Normal speech and language. No gross focal neurologic deficits are appreciated.  Skin:  Skin is warm, dry and intact. No rash noted. Psychiatric: Mood and affect are normal. Speech and behavior are normal.  ____________________________________________   LABS (all labs ordered are listed, but only abnormal results are displayed)  Labs Reviewed  CBC WITH DIFFERENTIAL/PLATELET -  Abnormal; Notable for the following components:      Result Value   RBC 3.74 (*)    Hemoglobin 11.6 (*)    HCT 36.1 (*)    All other components within normal limits  BASIC METABOLIC PANEL - Abnormal; Notable for the following components:   Glucose, Bld 126 (*)    BUN 24 (*)    All other components within normal limits  URINALYSIS, COMPLETE (UACMP) WITH MICROSCOPIC - Abnormal; Notable for the following components:   Hgb urine dipstick TRACE (*)    Nitrite POSITIVE (*)    Leukocytes,Ua LARGE (*)    Bacteria, UA RARE (*)    All other components within normal limits  SARS CORONAVIRUS 2 (TAT 6-24 HRS)  URINE CULTURE  TROPONIN I (HIGH SENSITIVITY)  TROPONIN I (HIGH SENSITIVITY)   ____________________________________________  EKG  ED ECG REPORT I, Blake Divine, the attending physician, personally viewed and interpreted this ECG.   Date: 12/31/2020  EKG Time: 9:42  Rate: 53  Rhythm: sinus bradycardia  Axis: Normal  Intervals:left bundle branch block  ST&T Change: None   PROCEDURES  Procedure(s) performed (including  Critical Care):  Procedures   ____________________________________________   INITIAL IMPRESSION / ASSESSMENT AND PLAN / ED COURSE       87 year old male with past medical history of hypertension, hyperlipidemia, CAD, prostate cancer, and paroxysmal A. fib on aspirin who presents to the ED for syncopal episode while sitting down to breakfast at his assisted living facility.  Patient does describe some prodromal lightheadedness along with some ongoing lightheadedness.  We will check EKG and labs including troponin, hydrate with IV fluids.  He did have a very similar sounding episode in December of last year, was admitted for syncope work-up at that time and found to have urinary tract infection.  We will screen UA for infection but a cardiac work-up during recent admission was unremarkable.  Patient's daughter at bedside and states that he is altered from his usual baseline.  CT head was performed and negative for acute process.  Labs are unremarkable, EKG shows no evidence of arrhythmia or ischemia, does show sinus bradycardia with left bundle branch block similar to previous.  UA is concerning for UTI with positive nitrites, we will send for culture and treat with Rocephin.  Given his syncope and altered mental status, case discussed with hospitalist for admission.      ____________________________________________   FINAL CLINICAL IMPRESSION(S) / ED DIAGNOSES  Final diagnoses:  Syncope, unspecified syncope type  Altered mental status, unspecified altered mental status type  Acute cystitis without hematuria     ED Discharge Orders    None       Note:  This document was prepared using Dragon voice recognition software and may include unintentional dictation errors.   Blake Divine, MD 12/31/20 [phone]

## 2020-12-31 NOTE — Progress Notes (Signed)
OT Cancellation Note  Patient Details Name: DARNEL MCHAN MRN: [id number] DOB: [birth year]   Cancelled Treatment:    Reason Eval/Treat Not Completed: Patient at procedure or test/ unavailable. Consult received, chart reviewed. Pt off the floor. Will re-attempt next date as medically appropriate.   Hanley Hays, MPH, MS, OTR/L ascom [phone] 12/31/20, 4:47 PM

## 2020-12-31 NOTE — H&P (Addendum)
History and Physical  Jason Murphy WGN:[id number] DOB: Jul 26, 1920 DOA: 12/31/2020  Referring physician: Dr. Charna Archer, [place]. PCP: Baxter Hire, MD  Outpatient Specialists: Urology, cardiology, oncology. Patient coming from: Assisted living facility.  Chief Complaint: Altered mental status, dizziness.  HPI: Jason Murphy is a 87 y.o. male with medical history significant for prostate cancer, paroxysmal A. fib, hypothyroidism, hyperlipidemia, coronary artery disease, who presented to [facility] ED from ALF due to sudden onset altered mental status associated with dizziness.  History mainly obtained from his girlfriend via phone as the patient does not recall the event that led to his presentation to the ED.  Per his girlfriend, patient was in his usual state of health until this morning at breakfast.   He suddenly felt lightheaded and dizzy and appeared confused.  There was no loss of consciousness.  Vital signs were taken and patient was bradycardic.  He was brought to the ED for further evaluation.  While in the ED CT head was negative for any acute intracranial findings.  Urine analysis was abnormal with positive nitrite and large leukocytes on UA.  Patient had a similar presentation back in December 2021, was found to have a urinary tract infection which was treated with improvement of his symptomatology.  ED Course:  Afebrile, BP 115/69, heart rate 56, respiratory rate 19, O2 saturation 95% on room air.  Urine analysis, positive nitrite, large leukocytes on UA.  Lab studies essentially unremarkable.  Review of Systems: Review of systems as noted in the HPI. All other systems reviewed and are negative.   Past Medical History:  Diagnosis Date  . Cancer [facility])    prostate  . Coronary artery disease   . Dizziness    chronic dizziness  . Dyspnea   . Fatigue    Progressive fatigue with question of sleep apnea  . Hypercholesterolemia   . Hypertension   . Obstructive sleep apnea   . Pneumonia    . Restless leg syndrome    Past Surgical History:  Procedure Laterality Date  . APPENDECTOMY    . CARDIAC CATHETERIZATION     Ejection Fraction was 60%, stent  . KNEE SURGERY     arthroscopic left knee surgery  . SHOULDER ARTHROSCOPY      Social History:  reports that he has never smoked. He has never used smokeless tobacco. He reports current alcohol use. He reports that he does not use drugs.   No Known Allergies  Family History  Problem Relation Age of Onset  . COPD Mother       Prior to Admission medications   Medication Sig Start Date End Date Taking? Authorizing Provider  AMBULATORY NON FORMULARY MEDICATION Trimix (30/1/10)-(Pap/Phent/PGE)  Test Dose  58ml vial   Qty #3 [place] [phone] Fax [phone] 04/29/20   Hollice Espy, MD  aspirin EC 81 MG tablet Take 81 mg by mouth at bedtime.    [provider]  Cholecalciferol (VITAMIN D3) 1000 units CAPS Take 1,000 Units by mouth daily.    [provider]  co-enzyme Q-10 50 MG capsule Take 100 mg by mouth every morning.    [provider]  cyanocobalamin 500 MCG tablet Take 500 mcg by mouth every morning.    [provider]  finasteride (PROSCAR) 5 MG tablet Take 5 mg by mouth daily. Patient not taking: Reported on 10/06/2020 04/17/20 04/17/21  [provider]  isosorbide mononitrate (IMDUR) 30 MG 24 hr tablet Take 30 mg by mouth  daily. 04/09/20   [provider]  lansoprazole (PREVACID) 15 MG capsule Take 15-30 mg by mouth See admin instructions. Take 1 capsule by mouth every other day alternating with 2 capsules (30mg ) by mouth every other day.    [provider]  levothyroxine (SYNTHROID, LEVOTHROID) 50 MCG tablet Take 50 mcg by mouth daily before breakfast.    [provider]  meclizine (ANTIVERT) 25 MG tablet Take 25 mg by mouth 2 (two) times daily as needed for dizziness.    [provider]  Multiple  Vitamins-Minerals (PRESERVISION AREDS 2 PO) Take 1 tablet by mouth daily.    [provider]  nitroGLYCERIN (NITROSTAT) 0.4 MG SL tablet Place 0.4 mg under the tongue every 5 (five) minutes as needed for chest pain.    [provider]  rOPINIRole (REQUIP) 1 MG tablet Take 1 mg by mouth 3 (three) times daily. 03/10/20   [provider]  sildenafil (VIAGRA) 25 MG tablet Take by mouth. 04/17/20 05/17/20  [provider]  simvastatin (ZOCOR) 40 MG tablet TAKE 1 TABLET BY MOUTH EVERY NIGHT AT BEDTIME Patient taking differently: Take 40 mg by mouth daily at 6 PM. 05/26/11   Martinique, Peter M, MD  pramipexole (MIRAPEX) 0.5 MG tablet Take 0.5 mg by mouth at bedtime. 09/18/17 03/15/20  [provider]    Physical Exam: BP 125/60   Pulse 61   Temp (!) 97.5 F (36.4 C) (Axillary)   Resp 16   Wt 71 kg   SpO2 99%   BMI 22.46 kg/m   . General: 87 y.o. year-old male well developed well nourished in no acute distress.  Alert and oriented x3. . Cardiovascular: Regular rate and rhythm with no rubs or gallops.  No thyromegaly or JVD noted.  No lower extremity edema. 2/4 pulses in all 4 extremities. Marland Kitchen Respiratory: Clear to auscultation with no wheezes or rales. Good inspiratory effort. . Abdomen: Soft nontender nondistended with normal bowel sounds x4 quadrants. . Muskuloskeletal: No cyanosis, clubbing or edema noted bilaterally . Neuro: CN II-XII intact, strength, sensation, reflexes . Skin: No ulcerative lesions noted or rashes . Psychiatry: Judgement and insight appear normal. Mood is appropriate for condition and setting          Labs on Admission:  Basic Metabolic Panel: Recent Labs  Lab 12/31/20 0946  NA 139  K 4.1  CL 105  CO2 27  GLUCOSE 126*  BUN 24*  CREATININE 1.03  CALCIUM 9.0   Liver Function Tests: No results for input(s): AST, ALT, ALKPHOS, BILITOT, PROT, ALBUMIN in the last 168 hours. No results for input(s): LIPASE, AMYLASE in the last 168  hours. No results for input(s): AMMONIA in the last 168 hours. CBC: Recent Labs  Lab 12/31/20 0946  WBC 7.5  NEUTROABS 4.2  HGB 11.6*  HCT 36.1*  MCV 96.5  PLT 264   Cardiac Enzymes: No results for input(s): CKTOTAL, CKMB, CKMBINDEX, TROPONINI in the last 168 hours.  BNP (last 3 results) Recent Labs    10/08/20 0802  BNP 272.6*    ProBNP (last 3 results) No results for input(s): PROBNP in the last 8760 hours.  CBG: No results for input(s): GLUCAP in the last 168 hours.  Radiological Exams on Admission: CT Head Wo Contrast  Result Date: 12/31/2020 CLINICAL DATA:  Mental status change. Patient was sitting at the breakfast table when pt became diaphoretic and defecated. EXAM: CT HEAD WITHOUT CONTRAST TECHNIQUE: Contiguous axial images were obtained from the base of the  skull through the vertex without intravenous contrast. COMPARISON:  None. FINDINGS: Brain: No evidence of acute infarction, hemorrhage, hydrocephalus, extra-axial collection or mass lesion/mass effect. There is mild diffuse low-attenuation within the subcortical and periventricular white matter compatible with chronic microvascular disease. Remote left posterior basal ganglia lacunar infarct. Prominence of the sulci and ventricles compatible with brain atrophy. Vascular: No hyperdense vessel or unexpected calcification. Skull: Scattered foci of abnormal increased sclerosis within the visualized calvarium compatible sclerotic bone metastasis, presumably due to known prostate cancer. Sinuses/Orbits: No acute finding. Other: None. IMPRESSION: 1. No acute intracranial abnormalities. 2. Chronic small vessel ischemic disease and brain atrophy. 3. Multifocal sclerotic metastasis within the skull likely due to known metastatic prostate cancer. Electronically Signed   By: Kerby Moors M.D.   On: 12/31/2020 11:26    EKG: I independently viewed the EKG done and my findings are as followed: None available at the time of this  visit.  Assessment/Plan Present on Admission: . AMS (altered mental status)  Active Problems:   AMS (altered mental status)  Resolved altered mental status, suspect secondary to presumptive UTI. Patient presented after sudden onset altered mental status, lightheadedness, and dizziness. He had a similar presentation in December 2021, he was found to have UTI, was treated with improvement of his symptomatology. CT head unremarkable for acute intracranial findings. Abnormal urine analysis.  Obtain urine culture. Obtain orthosthatic vital signs for dizziness Started on Rocephin empirically in the ED, continue. Follow urine culture for ID and sensitivities. Received gentle IV fluid hydration, closely monitor volume status while on IV fluid due to history of heart failure.  DC IV fluid if tolerates po intake. Patient is currently alert and oriented x3.  He is able to answer questions appropriately.  Presumptive UTI, POA. Management as stated above.  Bradycardia Obtain TSH and 12 lead EKG Not on AV nodal blockade agent Monitor on telemetry  Chronic systolic CHF Euvolemic on exam Last 2D echo done on 10/07/2020 showed LVEF 35 to 40%. Strict I's and O's and daily weight Closely monitor volume status while on IV fluids. DC IV fluids if can tolerate oral fluid intake.  BPH Resume home regimen Closely monitor for any evidence of acute urinary retention.  GERD/hyperlipidemia/hypothyroidism/restless leg syndrome/dizziness Resume home regimen.    DVT prophylaxis: Subcu Lovenox daily  Code Status: Full code as stated by the patient himself.   Family Communication: Updated is girlfriend [place] via phone: [phone] at patient's own request, all questions answered to the best of my ability.  Disposition Plan: Admit to MedSurg with remote telemetry.  Consults called: None   Admission status: Observation.   Status is: Observation    Dispo:  Patient From: [place]  Planned Disposition: ALF  Anticipated date of discharge 01/01/2021.  Medically stable for discharge: No, ongoing management of presumptive UTI.      Kayleen Memos MD [facility] Pager [phone]  If 7PM-7AM, please contact night-coverage [email or web address] Password [email or web address]  12/31/2020, 2:00 PM

## 2020-12-31 NOTE — ED Notes (Signed)
In and Out catheter performed with assistance with [employer], 300 [address] out

## 2020-12-31 NOTE — ED Triage Notes (Signed)
Pt lives at [facility]. Was sitting at the table when pt became diaphoretic. Pt defecated. Pt b/p was low on EMS arrival.

## 2020-12-31 NOTE — Plan of Care (Signed)

## 2021-01-01 ENCOUNTER — Encounter: Payer: Self-pay | Admitting: Internal Medicine

## 2021-01-01 DIAGNOSIS — R4182 Altered mental status, unspecified: Secondary | ICD-10-CM | POA: Diagnosis not present

## 2021-01-01 DIAGNOSIS — N3000 Acute cystitis without hematuria: Secondary | ICD-10-CM | POA: Diagnosis not present

## 2021-01-01 DIAGNOSIS — E43 Unspecified severe protein-calorie malnutrition: Secondary | ICD-10-CM | POA: Diagnosis present

## 2021-01-01 DIAGNOSIS — Z515 Encounter for palliative care: Secondary | ICD-10-CM

## 2021-01-01 DIAGNOSIS — Z7189 Other specified counseling: Secondary | ICD-10-CM

## 2021-01-01 LAB — COMPREHENSIVE METABOLIC PANEL
ALT: 19 U/L (ref 0–44)
AST: 33 U/L (ref 15–41)
Albumin: 3.4 g/dL — ABNORMAL LOW (ref 3.5–5.0)
Alkaline Phosphatase: 93 U/L (ref 38–126)
Anion gap: 7 (ref 5–15)
BUN: 24 mg/dL — ABNORMAL HIGH (ref 8–23)
CO2: 23 mmol/L (ref 22–32)
Calcium: 8.6 mg/dL — ABNORMAL LOW (ref 8.9–10.3)
Chloride: 107 mmol/L (ref 98–111)
Creatinine, Ser: 0.84 mg/dL (ref 0.61–1.24)
GFR, Estimated: >60 mL/min (ref >60)
Glucose, Bld: 87 mg/dL (ref 70–99)
Potassium: 4.1 mmol/L (ref 3.5–5.1)
Sodium: 137 mmol/L (ref 135–145)
Total Bilirubin: 1 mg/dL (ref 0.3–1.2)
Total Protein: 6.2 g/dL — ABNORMAL LOW (ref 6.5–8.1)

## 2021-01-01 LAB — TSH: TSH: 4.639 u[IU]/mL — ABNORMAL HIGH (ref 0.350–4.500)

## 2021-01-01 LAB — CBC
HCT: 31.5 % — ABNORMAL LOW (ref 39.0–52.0)
Hemoglobin: 10.6 g/dL — ABNORMAL LOW (ref 13.0–17.0)
MCH: 31.9 pg (ref 26.0–34.0)
MCHC: 33.7 g/dL (ref 30.0–36.0)
MCV: 94.9 fL (ref 80.0–100.0)
Platelets: 236 10*3/uL (ref 150–400)
RBC: 3.32 MIL/uL — ABNORMAL LOW (ref 4.22–5.81)
RDW: 13.4 % (ref 11.5–15.5)
WBC: 7 10*3/uL (ref 4.0–10.5)
nRBC: 0 % (ref 0.0–0.2)

## 2021-01-01 LAB — URINE CULTURE

## 2021-01-01 LAB — T4, FREE: Free T4: 0.75 ng/dL (ref 0.61–1.12)

## 2021-01-01 MED ORDER — ENSURE ENLIVE PO LIQD
237.0000 mL | Freq: Three times a day (TID) | ORAL | Status: DC
  Administered 2021-01-01 (×2): 237 mL via ORAL

## 2021-01-01 NOTE — Progress Notes (Signed)
[facility] Progress Note  Patient: Jason Murphy    YDX:[id number]  DOA: 12/31/2020     Date of Service: the patient was seen and examined on 01/01/2021  Chief Complaint  Patient presents with   Dizziness    syn   Loss of Consciousness   Brief hospital course: Jason Murphy is a 87 y.o. male with medical history significant for prostate cancer, paroxysmal A. fib, hypothyroidism, hyperlipidemia, coronary artery disease, who presented to [facility] ED from ALF due to sudden onset altered mental status associated with dizziness.  History mainly obtained from his girlfriend via phone as the patient does not recall the event that led to his presentation to the ED.  Per his girlfriend, patient was in his usual state of health until this morning at breakfast.   He suddenly felt lightheaded and dizzy and appeared confused.  There was no loss of consciousness.  Vital signs were taken and patient was bradycardic.  He was brought to the ED for further evaluation.  While in the ED CT head was negative for any acute intracranial findings.  Urine analysis was abnormal with positive nitrite and large leukocytes on UA.  Patient had a similar presentation back in December 2021, was found to have a urinary tract infection which was treated with improvement of his symptomatology.  ED Course:  Afebrile, BP 115/69, heart rate 56, respiratory rate 19, O2 saturation 95% on room air.  Urine analysis, positive nitrite, large leukocytes on UA.  Lab studies essentially unremarkable.    Assessment and Plan: Present on Admission:  AMS (altered mental status)  Active Problems:   AMS (altered mental status)  Resolved altered mental status, suspect secondary to presumptive UTI. Patient presented after sudden onset altered mental status, lightheadedness, and dizziness. He had a similar presentation in December 2021, he was found to have UTI, was treated with improvement of his symptomatology. CT head unremarkable  for acute intracranial findings. Abnormal urine analysis.   Obtain orthosthatic vital signs for dizziness Started on Rocephin empirically in the ED, continue. urine culture multiple species possible contamination 3/18 Follow repeat urine culture for ID and sensitivities. Received gentle IV fluid hydration, closely monitor volume status while on IV fluid due to history of heart failure.  DC IV fluid if tolerates po intake. Patient is currently alert and oriented x3.  He is able to answer questions appropriately.    Presumptive UTI, POA. Management as stated above.   Bradycardia Troponin x2 -, TSH 4.6 slightly elevated  12 lead EKG Not on AV nodal blockade agent Monitor on telemetry  Chronic systolic CHF Euvolemic on exam Last 2D echo done on 10/07/2020 showed LVEF 35 to 40%. Strict I's and O's and daily weight Closely monitor volume status while on IV fluids. DC IV fluids if can tolerate oral fluid intake.  BPH Resume home regimen Closely monitor for any evidence of acute urinary retention. Patient does self-catheterization every night due to incomplete bladder emptying and avoid to wake up in the middle of the night  GERD/hyperlipidemia/hypothyroidism/restless leg syndrome/dizziness Resume home regimen.   Body mass index is 21.72 kg/m.  Nutrition Problem: Severe Malnutrition Etiology: cancer and cancer related treatments Interventions:      Diet: Regular DVT Prophylaxis: Subcutaneous Heparin    Advance goals of care discussion: Full code  Family Communication: family was present at bedside, at the time of interview.  The pt provided permission to discuss medical plan with the family. Opportunity was given to ask question and all  questions were answered satisfactorily.   Disposition:  Pt is from ALF, admitted with AMS, UTI, bradycardia, dizziness, still has UTI, dizziness and at risk for fall, which precludes a safe discharge. Discharge to ALF, when dizziness  resolves and ambulates well in the hallway without any symptoms.  Urine culture pending  Subjective: Patient was admitted yesterday due to altered mental status as per patient it happened 2 years ago as well due to UTI.  Patient does self-catheterization at the nighttime due to incomplete voiding.  Currently patient feels fine but he stated that he is having episodes of dizziness and when he is sitting or walking then he has to go back to lay down. We will ambulate patient and check orthostatics, urine culture will be recollected Plan for disposition tomorrow a.m.  Physical Exam: General:  alert oriented to time, place, and person.  Appear in mild distress, affect appropriate Eyes: PERRLA ENT: Oral Mucosa Clear, moist  Neck: no JVD,  Cardiovascular: S1 and S2 Present, no Murmur,  Respiratory: good respiratory effort, Bilateral Air entry equal and Decreased, no Crackles, no wheezes Abdomen: Bowel Sound present, Soft and no tenderness,  Skin: no rashes Extremities: no Pedal edema, no calf tenderness Neurologic: without any new focal findings Gait not checked due to patient safety concerns  Vitals:   01/01/21 0515 01/01/21 0519 01/01/21 0801 01/01/21 1228  BP: 120/68 (!) 139/55 136/71 120/67  Pulse: (!) 51 (!) 52 (!) 49 (!) 57  Resp: 16 16 18 16   Temp:   97.8 F (36.6 C) (!) 96.8 F (36 C)  TempSrc:   Oral Axillary  SpO2: 97%  95% 96%  Weight:  66.7 kg    Height:        Intake/Output Summary (Last 24 hours) at 01/01/2021 1406 Last data filed at 01/01/2021 1402 Gross per 24 hour  Intake 600 ml  Output 350 ml  Net 250 ml   Filed Weights   12/31/20 1255 12/31/20 1716 01/01/21 0519  Weight: 71 kg 68 kg 66.7 kg    Data Reviewed: I have personally reviewed and interpreted daily labs, tele strips, imagings as discussed above. I reviewed all nursing notes, pharmacy notes, vitals, pertinent old records I have discussed plan of care as described above with RN and  patient/family.  CBC: Recent Labs  Lab 12/31/20 0946 01/01/21 0354  WBC 7.5 7.0  NEUTROABS 4.2  --   HGB 11.6* 10.6*  HCT 36.1* 31.5*  MCV 96.5 94.9  PLT 264 161   Basic Metabolic Panel: Recent Labs  Lab 12/31/20 0946 01/01/21 0354  NA 139 137  K 4.1 4.1  CL 105 107  CO2 27 23  GLUCOSE 126* 87  BUN 24* 24*  CREATININE 1.03 0.84  CALCIUM 9.0 8.6*    Studies: No results found.  Scheduled Meds:  aspirin EC  81 mg Oral QHS   enoxaparin (LOVENOX) injection  40 mg Subcutaneous Q24H   feeding supplement  237 mL Oral TID BM   isosorbide mononitrate  30 mg Oral Daily   levothyroxine  50 mcg Oral QAC breakfast   multivitamin-lutein  1 capsule Oral Daily   pantoprazole  20 mg Oral Daily   rOPINIRole  1 mg Oral TID   simvastatin  40 mg Oral QHS   Continuous Infusions:  cefTRIAXone (ROCEPHIN)  IV     PRN Meds: meclizine  Time spent: 35 minutes  Author: Val Riles. MD [facility] 01/01/2021 2:06 PM  To reach On-call, see care teams to locate  the attending and reach out to them via [email or web address]. If 7PM-7AM, please contact night-coverage If you still have difficulty reaching the attending provider, please page the [facility] (Director on Call) for [facility] on amion for assistance.

## 2021-01-01 NOTE — Progress Notes (Addendum)
Initial Nutrition Assessment  DOCUMENTATION CODES:   Severe malnutrition in context of chronic illness  INTERVENTION:   Ensure Enlive po TID, each supplement provides 350 kcal and 20 grams of protein  Magic cup TID with meals, each supplement provides 290 kcal and 9 grams of protein  Liberalize diet- Assist with meal ordering   NUTRITION DIAGNOSIS:   Severe Malnutrition related to cancer and cancer related treatments as evidenced by moderate fat depletion,moderate muscle depletion,severe muscle depletion, 13 percent weight loss in 5 months.  GOAL:   Patient will meet greater than or equal to 90% of their needs  MONITOR:   PO intake,Supplement acceptance,Labs,Weight trends,Skin,I & O's  REASON FOR ASSESSMENT:   Malnutrition Screening Tool    ASSESSMENT:   87 y.o. male with past medical history of hypertension, hyperlipidemia, CAD, paroxysmal A. fib on aspirin and prostate cancer who presents to the ED for syncope.   Met with pt in room today. Pt is upset today because he reports that he did not get half of what he ordered on his meal tray. Pt shows RD his menu with meal items circled that he did not receive. Pt reports good appetite and oral intake at baseline. Pt reports that he does not drink any supplements at home. Pt does report weight loss; pt reports that he used to weigh 195lbs but has gradually lost down to 150lbs over the years. Per chart, pt is down 23lbs(13%) over the past 5 months; this is significant weight loss. RD discussed with pt the importance of adequate protein intake needed to preserve lean muscle. Pt reports that he would like to have strawberry Ensure in hospital; RD recommended for pt to continue supplements after discharge. RD will also liberalize pts diet. Pt documented to be eating 75-100% of meals in hospital. RD called down and ordered the additional food items that pt did not receive on his tray; pt is happy with this resolution. RD will also request  assistance with ordering meals.   Medications reviewed and include: aspirin, lovenox, synthroid, ocuvite, protonix, ceftriaxone  Labs reviewed: BUN 24(H) Hgb 10.6(L), Hct 31.5(L)  NUTRITION - FOCUSED PHYSICAL EXAM:  Flowsheet Row Most Recent Value  Orbital Region Moderate depletion  Upper Arm Region Moderate depletion  Thoracic and Lumbar Region Moderate depletion  Buccal Region Moderate depletion  Temple Region Moderate depletion  Clavicle Bone Region Severe depletion  Clavicle and Acromion Bone Region Severe depletion  Scapular Bone Region Moderate depletion  Dorsal Hand Severe depletion  Patellar Region Severe depletion  Anterior Thigh Region Severe depletion  Posterior Calf Region Severe depletion  Edema (RD Assessment) None  Hair Reviewed  Eyes Reviewed  Mouth Reviewed  Skin Reviewed  Nails Reviewed     Diet Order:   Diet Order            Diet regular Room service appropriate? Yes; Fluid consistency: Thin  Diet effective now                EDUCATION NEEDS:   Education needs have been addressed  Skin:  Skin Assessment: Reviewed RN Assessment  Last BM:  3/17- type 5  Height:   Ht Readings from Last 1 Encounters:  12/31/20 5\' 9"  (1.753 m)    Weight:   Wt Readings from Last 1 Encounters:  01/01/21 66.7 kg    Ideal Body Weight:  72.7 kg  BMI:  Body mass index is 21.72 kg/m.  Estimated Nutritional Needs:   Kcal:  1700-1900kcal/day  Protein:  85-95g/day  Fluid:  1.7-1.9L/day  Koleen Distance MS, RD, LDN Please refer to [facility] for RD and/or RD on-call/weekend/after hours pager

## 2021-01-01 NOTE — Consult Note (Signed)
Consultation Note Date: 01/01/2021   Patient Name: Jason Murphy  DOB: 1919-12-08  MRN: [id number]  Age / Sex: 87 y.o., male  PCP: Baxter Hire, MD Referring Physician: Val Riles, MD  Reason for Consultation: Establishing goals of care and Psychosocial/spiritual support  HPI/Patient Profile: 87 y.o. male  with past medical history of prostate cancer mets to bone, p A. fib, hypothyroid, HLD, CAD, from [facility]F admitted on 12/31/2020 with altered mental status suspect secondary to UTI, chronic systolic heart failure, prostate cancer with metastatic burden to bone.   Clinical Assessment and Goals of Care: I have reviewed medical records including EPIC notes, labs and imaging, examined the patient and met at bedside with daughter, Sydnee Cabal to discuss diagnosis prognosis, [place], EOL wishes, disposition and options.  I introduced Palliative Medicine as specialized medical care for people living with serious illness. It focuses on providing relief from the symptoms and stress of a serious illness.   We discussed a brief life review of the patient.  Mr. Dymek tells me that he served in [place] in the [employer].  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  He states that from that point he continue working in [employer], and being self-employed.  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  Rush Landmark is tearful when he talks about his wife, she passed 2 years ago.  As far as functional and nutritional status, Bill lives in [facility].  He is relatively functionally independent.  We discussed current illness and what it means in the larger context of on-going co-morbidities.  Natural disease trajectory and expectations at EOL were discussed.  We talked in detail about UTI in men being "complicated".  Rush Landmark states that he was not aware of  this, and had initially felt that the physicians had missed treatment.  [name] shares that Rush Landmark also in and out caths as needed.  I encouraged him to be mindful of recurrent UTIs, but that sometimes symptoms are not evident in the older population.  I share that it is encouraging that he has had such a quick turnaround.  I attempted to elicit values and goals of care important to the patient.  Rush Landmark is ready to return to [facility]F upon discharge.  He would rehospitalize as needed.  We talked about a few "what if's and maybe's".  Advanced directives booklet left with patient and daughter.  Advanced directives, concepts specific to code status, artifical feeding and hydration, and rehospitalization were considered and discussed.  At this point, Rush Landmark states that he would accept life support.  I encouraged him to consider length of time.  We talked about the chronic illness pathway, and the expectation that he would be weaker if he needed and survive CPR.  He may need to live in a nursing home instead of ALF.  Hospice and Palliative Care services outpatient were explained and offered.  We talked about oncology  palliative services during May appointment.  Outpatient palliative services to follow.  Questions and concerns were addressed.  The family was encouraged to call with questions or concerns.   Conference with attending, bedside nursing staff, transition of care team related to patient condition, needs, goals of care. ACP updated.   HCPOA   NEXT OF KIN -Mr. Schoon, Rush Landmark, names his adult children as his healthcare surrogate.  Advanced directive booklet provided.    SUMMARY OF RECOMMENDATIONS   Continue full scope/full code Rehospitalize as needed To follow-up with oncology/oncology palliative services May 2022 Return to ALF   Code Status/Advance Care Planning:  Full code -we talked about the concept of "treat the treatable, but allowing natural passing".  I encouraged Bill to consider if  he wants life support, for how long?  Advanced directives paperwork left with patient and daughter.  Symptom Management:   Per hospitalist, no additional needs at this time.  Palliative Prophylaxis:   Frequent Pain Assessment and Oral Care  Additional Recommendations (Limitations, Scope, Preferences):  Full Scope Treatment  Psycho-social/Spiritual:   Desire for further Chaplaincy support:no  Additional Recommendations: Caregiving  Support/Resources and Education on Hospice  Prognosis:   Unable to determine, based on outcomes.  6 months or more would not be surprising based on functional status, chronic disease burden.  Discharge Planning: Anticipate return to ALF, unsure of any home health needs.      Primary Diagnoses: Present on Admission: . AMS (altered mental status)   I have reviewed the medical record, interviewed the patient and family, and examined the patient. The following aspects are pertinent.  Past Medical History:  Diagnosis Date  . Cancer [facility])    prostate  . Coronary artery disease   . Dizziness    chronic dizziness  . Dyspnea   . Fatigue    Progressive fatigue with question of sleep apnea  . Hypercholesterolemia   . Hypertension   . Obstructive sleep apnea   . Pneumonia   . Restless leg syndrome    Social History   Socioeconomic History  . Marital status: Widowed    Spouse name: Not on file  . Number of children: 4  . Years of education: Col Grad  . Highest education level: Not on file  Occupational History  . Occupation: Retired  Tobacco Use  . Smoking status: Never Smoker  . Smokeless tobacco: Never Used  Vaping Use  . Vaping Use: Never used  Substance and Sexual Activity  . Alcohol use: Yes    Comment: 1/2 glass wine daily  . Drug use: No  . Sexual activity: Not on file  Other Topics Concern  . Not on file  Social History Narrative   Caffeine Occ cup of coffee.   Social Determinants of Health   Financial Resource  Strain: Not on file  Food Insecurity: Not on file  Transportation Needs: Not on file  Physical Activity: Not on file  Stress: Not on file  Social Connections: Not on file   Family History  Problem Relation Age of Onset  . COPD Mother    Scheduled Meds: . aspirin EC  81 mg Oral QHS  . enoxaparin (LOVENOX) injection  40 mg Subcutaneous Q24H  . feeding supplement  237 mL Oral TID BM  . isosorbide mononitrate  30 mg Oral Daily  . levothyroxine  50 mcg Oral QAC breakfast  . multivitamin-lutein  1 capsule Oral Daily  . pantoprazole  20 mg Oral Daily  . rOPINIRole  1 mg Oral  TID  . simvastatin  40 mg Oral QHS   Continuous Infusions: . cefTRIAXone (ROCEPHIN)  IV     PRN Meds:.meclizine Medications Prior to Admission:  Prior to Admission medications   Medication Sig Start Date End Date Taking? Authorizing Provider  AMBULATORY NON FORMULARY MEDICATION Trimix (30/1/10)-(Pap/Phent/PGE)  Test Dose  41m vial   Qty [place]3 [phone] [phone]/14/21   BHollice Espy MD  aspirin EC 81 MG tablet Take 81 mg by mouth at bedtime.    [provider]  Cholecalciferol (VITAMIN D3) 1000 units CAPS Take 1,000 Units by mouth daily.    [provider]  co-enzyme Q-10 50 MG capsule Take 100 mg by mouth every morning.    [provider]  cyanocobalamin 500 MCG tablet Take 500 mcg by mouth every morning.    [provider]  finasteride (PROSCAR) 5 MG tablet Take 5 mg by mouth daily. Patient not taking: Reported on 10/06/2020 04/17/20 04/17/21  [provider]  isosorbide mononitrate (IMDUR) 30 MG 24 hr tablet Take 30 mg by mouth daily. 04/09/20   [provider]  lansoprazole (PREVACID) 15 MG capsule Take 15-30 mg by mouth See admin instructions. Take 1 capsule by mouth every other day alternating with 2 capsules (398m by mouth every other day.    [provider]  levothyroxine (SYNTHROID, LEVOTHROID)  50 MCG tablet Take 50 mcg by mouth daily before breakfast.    [provider]  meclizine (ANTIVERT) 25 MG tablet Take 25 mg by mouth 2 (two) times daily as needed for dizziness.    [provider]  Multiple Vitamins-Minerals (PRESERVISION AREDS 2 PO) Take 1 tablet by mouth daily.    [provider]  nitroGLYCERIN (NITROSTAT) 0.4 MG SL tablet Place 0.4 mg under the tongue every 5 (five) minutes as needed for chest pain.    [provider]  rOPINIRole (REQUIP) 1 MG tablet Take 1 mg by mouth 3 (three) times daily. 03/10/20   [provider]  sildenafil (VIAGRA) 50 MG tablet Take 50 mg by mouth as needed. 04/17/20 05/17/20  [provider]  simvastatin (ZOCOR) 40 MG tablet TAKE 1 TABLET BY MOUTH EVERY NIGHT AT BEDTIME Patient taking differently: Take 40 mg by mouth daily at 6 PM. 05/26/11   JoMartiniquePeter M, MD  pramipexole (MIRAPEX) 0.5 MG tablet Take 0.5 mg by mouth at bedtime. 09/18/17 03/15/20  [provider]   No Known Allergies Review of Systems  Unable to perform ROS: Age    Physical Exam Vitals and nursing note reviewed.  Constitutional:      General: He is not in acute distress.    Appearance: Normal appearance. He is not ill-appearing.  HENT:     Head: Normocephalic and atraumatic.     Mouth/Throat:     Mouth: Mucous membranes are moist.  Cardiovascular:     Rate and Rhythm: Normal rate.  Pulmonary:     Effort: Pulmonary effort is normal. No respiratory distress.  Musculoskeletal:        General: No swelling.  Skin:    General: Skin is warm and dry.  Neurological:     Mental Status: He is alert and oriented to person, place, and time.  Psychiatric:        Mood and Affect: Mood normal.        Behavior: Behavior normal.     Vital Signs: BP 120/67 (BP Location: Left Arm)   Pulse (!) 57  Temp (!) 96.8 F (36 C) (Axillary)   Resp 16   Ht _0  (1.753 m)   Wt 66.7 kg   SpO2 96%   BMI 21.72 kg/m  Pain Scale:  0-10   Pain Score: 0-No pain   SpO2: SpO2: 96 % O2 Device:SpO2: 96 % O2 Flow Rate: .   IO: Intake/output summary:   Intake/Output Summary (Last 24 hours) at 01/01/2021 1306 Last data filed at 01/01/2021 1006 Gross per 24 hour  Intake 480 ml  Output 350 ml  Net 130 ml    LBM: Last BM Date: 12/31/20 Baseline Weight: Weight: 71 kg Most recent weight: Weight: 66.7 kg     Palliative Assessment/Data:   Flowsheet Rows   Flowsheet Row Most Recent Value  Intake Tab   Referral Department Hospitalist  Unit at Time of Referral Med/Surg Unit  Palliative Care Primary Diagnosis Cancer  Date Notified 01/01/21  Palliative Care Type New Palliative care  Reason for referral Clarify Goals of Care  Date of Admission 12/31/20  Date first seen by Palliative Care 01/01/21  # of days Palliative referral response time 0 Day(s)  # of days IP prior to Palliative referral 1  Clinical Assessment   Palliative Performance Scale Score 50%  Pain Max last 24 hours Not able to report  Pain Min Last 24 hours Not able to report  Dyspnea Max Last 24 Hours Not able to report  Dyspnea Min Last 24 hours Not able to report  Psychosocial & Spiritual Assessment   Palliative Care Outcomes       Time In: 1045 Time Out: 1155 Time Total: 70 minutes  Greater than 50%  of this time was spent counseling and coordinating care related to the above assessment and plan.  Signed by: Drue Novel, NP   Please contact Palliative Medicine Team phone at [phone] for questions and concerns.  For individual provider: See Shea Evans

## 2021-01-01 NOTE — Care Management Obs Status (Signed)
[facility] NOTIFICATION   Patient Details  Name: CADE DASHNER MRN: [id number] Date of Birth: 08-Apr-1920   Medicare Observation Status Notification Given:  Yes [id number]  Pete Pelt, RN 01/01/2021, 3:24 PM

## 2021-01-01 NOTE — Progress Notes (Signed)
Mobility Specialist - Progress Note   01/01/21 1400  Mobility  Activity Contraindicated/medical hold  Mobility performed by Mobility specialist  $Mobility charge 1 Mobility    Pt lying supine with HOB flat upon arrival. Orthostatics taken prior to activity: 78/49 then 76/47 on 2nd attempt, still supine. O2 at 95% on RA. Further activity deferred. Pt denies dizziness or nausea. RN entered and is notified of low readings. BP taken a 3rd time improving to 98/63. Session concluded.    Kathee Delton Mobility Specialist 01/01/21, 2:23 PM

## 2021-01-01 NOTE — Progress Notes (Signed)
PT Cancellation Note  Patient Details Name: Jason Murphy MRN: [id number] DOB: 06-Jan-1920   Cancelled Treatment:    Reason Eval/Treat Not Completed: Medical issues which prohibited therapy. Per chart review, pt hypotensive earlier with moblitiy specialist: 70s/40s mmHg. Will attempt evlauation again at later date time.    Jason Murphy C 01/01/2021, 6:33 PM

## 2021-01-01 NOTE — Evaluation (Signed)
Occupational Therapy Evaluation Patient Details Name: Jason Murphy MRN: [id number] DOB: 12/17/1919 Today's Date: 01/01/2021    History of Present Illness 87 y.o. male with medical history significant for prostate cancer, paroxysmal A. fib, hypothyroidism, hyperlipidemia, coronary artery disease, who presented to [facility] ED from ALF due to sudden onset altered mental status associated with dizziness. While in the ED CT head was negative for any acute intracranial findings.  Urine analysis was abnormal with positive nitrite and large leukocytes on UA.  Patient had a similar presentation back in December 2021, was found to have a urinary tract infection which was treated with improvement of his symptomatology.   Clinical Impression   Pt was seen for OT evaluation this date. Prior to hospital admission, pt was living at an ALF, ambulating with a 3PC when outside his room to dining hall, appointments, etc. 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 Pt pleasant, oriented x3 (grossly to situation, some mild STM deficits noted). Pt requires SBA to supervision for ADL and ADL mobility with HHA. No weakness/lightheadedness noted. HR low 60's.  Currently pt demonstrates impairments as described below (See OT problem list) which functionally limit his ability to perform ADL/self-care tasks. Pt would benefit from skilled OT services to address noted impairments and functional limitations (see below for any additional details) in order to maximize safety and independence while minimizing falls risk and caregiver burden. Upon hospital discharge, recommend intermittent supervision for safety. No OT follow up at this time following discharge.     Follow Up Recommendations  Supervision - Intermittent    Equipment Recommendations  Tub/shower seat    Recommendations for Other Services       Precautions / Restrictions Precautions Precautions:  Fall Restrictions Weight Bearing Restrictions: No      Mobility Bed Mobility               General bed mobility comments: deferred, up in recliner    Transfers Overall transfer level: Needs assistance Equipment used: None Transfers: Sit to/from Stand Sit to Stand: Min guard              Balance Overall balance assessment: Needs assistance Sitting-balance support: Feet supported;No upper extremity supported Sitting balance-Leahy Scale: Normal     Standing balance support: Single extremity supported Standing balance-Leahy Scale: Fair Standing balance comment: able to march in place with and without HHA, no overt LOB, but mildly unsteady and does better with UE support                           ADL either performed or assessed with clinical judgement   ADL Overall ADL's : Needs assistance/impaired                                       General ADL Comments: Pt able to perform ADL transfers with SBA-CGA, supervision to perform LB dressing; grossly supervision to SBA level     Vision Baseline Vision/History: Wears glasses Wears Glasses: Reading only Patient Visual Report: No change from baseline       Perception     Praxis      Pertinent Vitals/Pain Pain Assessment: No/denies pain     Hand Dominance Right   Extremity/Trunk Assessment Upper Extremity Assessment Upper Extremity Assessment: Overall WFL for tasks assessed  Lower Extremity Assessment Lower Extremity Assessment: Overall WFL for tasks assessed   Cervical / Trunk Assessment Cervical / Trunk Assessment: Kyphotic   Communication Communication Communication: No difficulties   Cognition Arousal/Alertness: Awake/alert Behavior During Therapy: WFL for tasks assessed/performed Overall Cognitive Status: Within Functional Limits for tasks assessed                                 General Comments: mild STM deficits   General Comments        Exercises     Shoulder Instructions      Home Living Family/patient expects to be discharged to:: Assisted living                             Home Equipment: Other (comment);Walker - 2 wheels;Grab bars - tub/shower   Additional Comments: small base 3PC      Prior Functioning/Environment Level of Independence: Needs assistance  Gait / Transfers Assistance Needed: no AD for ambulating within his unit, 3PC for walking to the dining hall, out ADL's / Homemaking Assistance Needed: indep with ADL, takes stand up shower, still drives, manages his own medication, does his own laundry; ALF provides meals, housekeeping; enjoys being involved in the local [employer], church, and enjoys trading stocks            OT Problem List: Impaired balance (sitting and/or standing);Decreased knowledge of use of DME or AE;Decreased activity tolerance      OT Treatment/Interventions: Self-care/ADL training;Therapeutic exercise;Therapeutic activities;Energy conservation;DME and/or AE instruction;Patient/family education;Balance training    OT Goals(Current goals can be found in the care plan section) Acute Rehab OT Goals Patient Stated Goal: go back to ALF OT Goal Formulation: With patient Time For Goal Achievement: 01/15/21 Potential to Achieve Goals: Good ADL Goals Additional ADL Goal #1: Pt will perform home medication mgt routine to prepare medication with supervision and 100% accuracy. Additional ADL Goal #2: Pt will perform routine AM ADL routine with supervision, no LOB.  OT Frequency: Min 1X/week   Barriers to D/C:            Co-evaluation              AM-PAC OT "6 Clicks" Daily Activity     Outcome Measure Help from another person eating meals?: None Help from another person taking care of personal grooming?: None Help from another person toileting, which includes using toliet, bedpan, or urinal?: A Little Help from another person bathing (including washing, rinsing,  drying)?: A Little Help from another person to put on and taking off regular upper body clothing?: None Help from another person to put on and taking off regular lower body clothing?: A Little 6 Click Score: 21   End of Session    Activity Tolerance: Patient tolerated treatment well Patient left: in chair;with call bell/phone within reach;with chair alarm set  OT Visit Diagnosis: Other abnormalities of gait and mobility (R26.89);History of falling (Z91.81);Muscle weakness (generalized) (M62.81)                Time: 6073-7106 OT Time Calculation (min): 26 min Charges:  OT General Charges $OT Visit: 1 Visit OT Evaluation $OT Eval Low Complexity: 1 Low OT Treatments $Self Care/Home Management : 8-22 mins  Hanley Hays, MPH, MS, OTR/L ascom [phone] 01/01/21, 2:04 PM

## 2021-01-01 NOTE — Plan of Care (Signed)
Alert and oriented x 4. Denies pain or discomfort at this time. Bed alarm in use, call bell within reach. Ambulates with assist and walker to bathroom. Requested self cath kit for in and out cath.  Problem: Education: Goal: Knowledge of General Education information will improve Description: Including pain rating scale, medication(s)/side effects and non-pharmacologic comfort measures Outcome: Progressing   Problem: Health Behavior/Discharge Planning: Goal: Ability to manage health-related needs will improve Outcome: Progressing   Problem: Clinical Measurements: Goal: Ability to maintain clinical measurements within normal limits will improve Outcome: Progressing Goal: Will remain free from infection Outcome: Progressing Goal: Diagnostic test results will improve Outcome: Progressing Goal: Respiratory complications will improve Outcome: Progressing Goal: Cardiovascular complication will be avoided Outcome: Progressing   Problem: Activity: Goal: Risk for activity intolerance will decrease Outcome: Progressing   Problem: Nutrition: Goal: Adequate nutrition will be maintained Outcome: Progressing   Problem: Coping: Goal: Level of anxiety will decrease Outcome: Progressing   Problem: Elimination: Goal: Will not experience complications related to bowel motility Outcome: Progressing Goal: Will not experience complications related to urinary retention Outcome: Progressing   Problem: Pain Managment: Goal: General experience of comfort will improve Outcome: Progressing   Problem: Safety: Goal: Ability to remain free from injury will improve Outcome: Progressing   Problem: Skin Integrity: Goal: Risk for impaired skin integrity will decrease Outcome: Progressing

## 2021-01-02 DIAGNOSIS — N3000 Acute cystitis without hematuria: Secondary | ICD-10-CM | POA: Diagnosis not present

## 2021-01-02 DIAGNOSIS — R4182 Altered mental status, unspecified: Secondary | ICD-10-CM

## 2021-01-02 DIAGNOSIS — E43 Unspecified severe protein-calorie malnutrition: Secondary | ICD-10-CM

## 2021-01-02 LAB — CBC
HCT: 31.5 % — ABNORMAL LOW (ref 39.0–52.0)
Hemoglobin: 10.6 g/dL — ABNORMAL LOW (ref 13.0–17.0)
MCH: 31.8 pg (ref 26.0–34.0)
MCHC: 33.7 g/dL (ref 30.0–36.0)
MCV: 94.6 fL (ref 80.0–100.0)
Platelets: 220 10*3/uL (ref 150–400)
RBC: 3.33 MIL/uL — ABNORMAL LOW (ref 4.22–5.81)
RDW: 13.4 % (ref 11.5–15.5)
WBC: 5.9 10*3/uL (ref 4.0–10.5)
nRBC: 0 % (ref 0.0–0.2)

## 2021-01-02 LAB — BASIC METABOLIC PANEL
Anion gap: 8 (ref 5–15)
BUN: 26 mg/dL — ABNORMAL HIGH (ref 8–23)
CO2: 23 mmol/L (ref 22–32)
Calcium: 8.5 mg/dL — ABNORMAL LOW (ref 8.9–10.3)
Chloride: 106 mmol/L (ref 98–111)
Creatinine, Ser: 0.8 mg/dL (ref 0.61–1.24)
GFR, Estimated: >60 mL/min (ref >60)
Glucose, Bld: 94 mg/dL (ref 70–99)
Potassium: 4 mmol/L (ref 3.5–5.1)
Sodium: 137 mmol/L (ref 135–145)

## 2021-01-02 LAB — T3, FREE: T3, Free: 2.2 pg/mL (ref 2.0–4.4)

## 2021-01-02 LAB — MAGNESIUM: Magnesium: 1.9 mg/dL (ref 1.7–2.4)

## 2021-01-02 LAB — PHOSPHORUS: Phosphorus: 4 mg/dL (ref 2.5–4.6)

## 2021-01-02 MED ORDER — ENSURE ENLIVE PO LIQD: 237.0000 mL | Freq: Three times a day (TID) | ORAL | 12 refills | Status: DC

## 2021-01-02 MED ORDER — CEFDINIR 300 MG PO CAPS: 300.0000 mg | ORAL_CAPSULE | Freq: Two times a day (BID) | ORAL | 0 refills | Status: AC

## 2021-01-02 NOTE — Plan of Care (Signed)
Patient to be discharged back to [facility] transported by daughter

## 2021-01-02 NOTE — Progress Notes (Signed)
IV removed, Discharge instructions reviewed, Patient transported by daughter back to [facility]

## 2021-01-02 NOTE — TOC Transition Note (Signed)
Transition of Care [facility]) - CM/SW Discharge Note   Patient Details  Name: Jason Murphy MRN: [id number] Date of Birth: 10-Mar-1920  Transition of Care [facility] CM/SW Contact:  Harriet Masson, RN Phone Number:[phone] 01/02/2021, 10:24 AM   Clinical Narrative:    Spoke representative from [facility]" [phone]) who confirms pt may return to the facility via private or EMS doors open til 7:30PM. No need to call report however if pt needs PT please let the facility know as they are contracted with [facility]) for their pts therapy needs. TOC RN also spoke with the pt's daughter Sydnee Cabal [phone] who will be transfer pt via private vehicle directly to the facility. Daughter states she has spoken with the provider who has confirmed pt will be discharge today or tomorrow due to his ongoing limited mobility.  TOC will continue to follow for discharge needs.        Patient Goals and CMS Choice        Discharge Placement                       Discharge Plan and Services                                     Social Determinants of Health (SDOH) Interventions     Readmission Risk Interventions No flowsheet data found.

## 2021-01-02 NOTE — Discharge Summary (Signed)
Physician Discharge Summary  Jason Murphy [id number]:[id number] DOB: 05-Feb-1920 DOA: 12/31/2020  PCP: Baxter Hire, MD  Admit date: 12/31/2020 Discharge date: 01/02/2021  Admitted From: [facility] Disposition:  [facility]  Recommendations for Outpatient Follow-up:  1. Follow up with PCP in 1-2 weeks 2. Please obtain BMP/CBC in one week  Home Health: No - PT recommended, facility to address with their contracted provider for therapy  Equipment/Devices: Already has   Discharge Condition: Stable  CODE STATUS: Full  Diet recommendation: Regular    Discharge Diagnoses: Active Problems:   AMS (altered mental status)   Protein-calorie malnutrition, severe    Summary of HPI and Hospital Course:   "Jason Murphy a 87 y.o.malewith medical history significant forprostate cancer, paroxysmal A. fib, hypothyroidism, hyperlipidemia, coronary artery disease, who presented to [facility] ED from ALF due to sudden onset altered mental status associated with dizziness. History mainly obtained from his girlfriend via phone as the patient does notrecall the event that led to his presentation to the ED. Per his girlfriend,patient was inhis usual state of health untilthis morning at breakfast. He suddenly felt lightheaded and dizzy and appeared confused. There was no loss of consciousness. Vital signs were taken andpatient was bradycardic. He was brought to the ED for further evaluation. While in the ED CT head wasnegative for any acute intracranial findings. Urine analysis wasabnormal with positive nitrite and large leukocytes on UA. Patient had a similar presentation back in December 2021,was found to have a urinary tract infectionwhichwas treated with improvement ofhissymptomatology.  ED Course:Afebrile, BP 115/69, heart rate 56, respiratory rate 19, O2 saturation 95% on room air. Urine analysis, positive nitrite, large leukocytes onUA. Lab studies essentially  unremarkable."         Acute infectious encephalopathy, likely due to presumptive UTI. - Resolved Patient presented after sudden onset altered mental status,lightheadedness, anddizziness. He had a similar presentation in December2021,he was found to have UTI, wastreated with improvement ofhissymptomatology. CT head unremarkable for acute intracranial findings. Abnormal urine analysis.  Culture grew multiple species.  Repeat culture pending but patient responded well and has clinically improved. Treated with empiric Rocephin in the ED and continued.  Dizziness - Orthostatic vitals checked and negative this AM, pt reporting dizziness resolved. Monitor BP and consider addition of low dose midodrine (but caution given his baseline bradycardia)  UTI POA. Treated with Rocephin empirically.  Discharge with 3 more days Omnicef. Urine culture multiple species possible containation. Repeat urine culture still pending at this time. Will discharge on Omnicef to complete course given good clinical response and no helpful culture data.    Bradycardia Asymptomatic, but with intermittent dizziness on standing. Troponin x2 -, TSH 4.6 slightly elevated  12 lead EKG showed sinus bradycardia with 1st degree AV block Not on AV nodal blockade agent. Follow up with cardiology outpatient  Chronicsystolic CHF Euvolemic on exam Last 2D echo done on 10/07/2020 showed LVEF 35 to 40%.  BPH / Hx of incomplete bladder emptying Resumed on home regimen.   No reported issues with acute urinary retention during admission. Patient does self-catheterization every night due to incomplete bladder emptying and avoid to wake up in the middle of the night.  GERD/hyperlipidemia/hypothyroidism/restless leg syndrome/dizziness Resumed on PTA home medications for chronic conditions.  Body mass index is 21.72 kg/m.  Nutrition Problem: Severe Malnutrition Etiology: cancer and cancer related  treatments Interventions:   Discharge Instructions  Discharge Instructions    Call MD for:  extreme fatigue   Complete by:  As directed    Call MD for:  persistant dizziness or light-headedness   Complete by: As directed    Call MD for:  persistant nausea and vomiting   Complete by: As directed    Call MD for:  severe uncontrolled pain   Complete by: As directed    Call MD for:  temperature >100.4   Complete by: As directed    Diet - low sodium heart healthy   Complete by: As directed    Increase activity slowly   Complete by: As directed      Allergies as of 01/02/2021   No Known Allergies     Medication List    STOP taking these medications   finasteride 5 MG tablet Commonly known as: PROSCAR   lansoprazole 15 MG capsule Commonly known as: PREVACID     TAKE these medications   AMBULATORY NON FORMULARY MEDICATION Trimix (30/1/10)-(Pap/Phent/PGE)  Test Dose  29ml vial   Qty #3 Refills 0  [place] [phone] Fax [phone]   aspirin EC 81 MG tablet Take 81 mg by mouth at bedtime.   cefdinir 300 MG capsule Commonly known as: OMNICEF Take 1 capsule (300 mg total) by mouth 2 (two) times daily for 3 days.   co-enzyme Q-10 50 MG capsule Take 100 mg by mouth every morning.   feeding supplement Liqd Take 237 mLs by mouth 3 (three) times daily between meals.   isosorbide mononitrate 30 MG 24 hr tablet Commonly known as: IMDUR Take 30 mg by mouth daily.   levothyroxine 50 MCG tablet Commonly known as: SYNTHROID Take 50 mcg by mouth daily before breakfast.   meclizine 25 MG tablet Commonly known as: ANTIVERT Take 25 mg by mouth 2 (two) times daily as needed for dizziness.   nitroGLYCERIN 0.4 MG SL tablet Commonly known as: NITROSTAT Place 0.4 mg under the tongue every 5 (five) minutes as needed for chest pain.   omeprazole 20 MG capsule Commonly known as: PRILOSEC Take 20 mg by mouth daily.   PRESERVISION AREDS 2 PO Take 1 tablet by  mouth daily.   rOPINIRole 1 MG tablet Commonly known as: REQUIP Take 1 mg by mouth 3 (three) times daily.   sildenafil 50 MG tablet Commonly known as: VIAGRA Take 50 mg by mouth as needed.   simvastatin 40 MG tablet Commonly known as: ZOCOR TAKE 1 TABLET BY MOUTH EVERY NIGHT AT BEDTIME What changed: when to take this   vitamin B-12 500 MCG tablet Commonly known as: CYANOCOBALAMIN Take 500 mcg by mouth every morning.   Vitamin D3 25 MCG (1000 UT) Caps Take 1,000 Units by mouth daily.       No Known Allergies   If you experience worsening of your admission symptoms, develop shortness of breath, life threatening emergency, suicidal or homicidal thoughts you must seek medical attention immediately by calling 911 or calling your MD immediately  if symptoms less severe.    Please note   You were cared for by a hospitalist during your hospital stay. If you have any questions about your discharge medications or the care you received while you were in the hospital after you are discharged, you can call the unit and asked to speak with the hospitalist on call if the hospitalist that took care of you is not available. Once you are discharged, your primary care physician will handle any further medical issues. Please note that NO REFILLS for any discharge medications will be authorized once you are discharged, as  it is imperative that you return to your primary care physician (or establish a relationship with a primary care physician if you do not have one) for your aftercare needs so that they can reassess your need for medications and monitor your lab values.   Consultations:  None    Procedures/Studies: CT Head Wo Contrast  Result Date: 12/31/2020 CLINICAL DATA:  Mental status change. Patient was sitting at the breakfast table when pt became diaphoretic and defecated. EXAM: CT HEAD WITHOUT CONTRAST TECHNIQUE: Contiguous axial images were obtained from the base of the skull through  the vertex without intravenous contrast. COMPARISON:  None. FINDINGS: Brain: No evidence of acute infarction, hemorrhage, hydrocephalus, extra-axial collection or mass lesion/mass effect. There is mild diffuse low-attenuation within the subcortical and periventricular white matter compatible with chronic microvascular disease. Remote left posterior basal ganglia lacunar infarct. Prominence of the sulci and ventricles compatible with brain atrophy. Vascular: No hyperdense vessel or unexpected calcification. Skull: Scattered foci of abnormal increased sclerosis within the visualized calvarium compatible sclerotic bone metastasis, presumably due to known prostate cancer. Sinuses/Orbits: No acute finding. Other: None. IMPRESSION: 1. No acute intracranial abnormalities. 2. Chronic small vessel ischemic disease and brain atrophy. 3. Multifocal sclerotic metastasis within the skull likely due to known metastatic prostate cancer. Electronically Signed   By: Kerby Moors M.D.   On: 12/31/2020 11:26       Subjective: Pt seen this AM, reports feeling better and wants to go home.  Denies dizziness when sitting upright.  Orthostatic vitals negative today.  Pt denies feeling confused, having hot sweats or cold chills. He reports some left upper arm pain since a fall about 4 weeks ago.  Says left wrist hurt at first but that's gotten better, but certain things he does will cause pain upper arm, he grabs his bicep.   Discharge Exam: Vitals:   01/02/21 1039 01/02/21 1041  BP: 124/63 (!) 110/54  Pulse: 66 72  Resp:  17  Temp:    SpO2: 95% 95%   Vitals:   01/02/21 0500 01/02/21 1036 01/02/21 1039 01/02/21 1041  BP:  (!) 91/55 124/63 (!) 110/54  Pulse:  71 66 72  Resp:  17  17  Temp:  98.2 F (36.8 C)    TempSrc:  Oral    SpO2:  96% 95% 95%  Weight: 69.1 kg     Height:        General: Pt is alert, awake, not in acute distress Cardiovascular: bradycardic, regular rhythm, S1/S2 +, no rubs, no  gallops Respiratory: CTA bilaterally, no wheezing, no rhonchi Abdominal: Soft, NT, ND, bowel sounds + Extremities: left upper extremity pain on palpation over the distal tendon, no pain on palpation of any bony prominence, no pain with passive or active ROM of Left shoulder.  no edema, no cyanosis    The results of significant diagnostics from this hospitalization (including imaging, microbiology, ancillary and laboratory) are listed below for reference.     Microbiology: Recent Results (from the past 240 hour(s))  Urine culture     Status: Abnormal   Collection Time: 12/31/20  9:46 AM   Specimen: Urine, Random  Result Value Ref Range Status   Specimen Description   Final    URINE, RANDOM Performed at [facility], [address] [place], [place] [address]    Special Requests   Final    NONE Performed at [facility], [address] [place],  [address]    Culture MULTIPLE SPECIES PRESENT,  SUGGEST RECOLLECTION (A)  Final   Report Status 01/01/2021 FINAL  Final  SARS CORONAVIRUS 2 (TAT 6-24 HRS) Nasopharyngeal Nasopharyngeal Swab     Status: None   Collection Time: 12/31/20 11:13 AM   Specimen: Nasopharyngeal Swab  Result Value Ref Range Status   SARS Coronavirus 2 NEGATIVE NEGATIVE Final    Comment: (NOTE) SARS-CoV-2 target nucleic acids are NOT DETECTED.  The SARS-CoV-2 RNA is generally detectable in upper and lower respiratory specimens during the acute phase of infection. Negative results do not preclude SARS-CoV-2 infection, do not rule out co-infections with other pathogens, and should not be used as the sole basis for treatment or other patient management decisions. Negative results must be combined with clinical observations, patient history, and epidemiological information. The expected result is Negative.  Fact Sheet for Patients: SugarRoll.be  Fact Sheet for Healthcare  Providers: https://www.woods-mathews.com/  This test is not yet approved or cleared by the [place] FDA and  has been authorized for detection and/or diagnosis of SARS-CoV-2 by FDA under an Emergency Use Authorization (EUA). This EUA will remain  in effect (meaning this test can be used) for the duration of the COVID-19 declaration under Se ction 564(b)(1) of the Act, 21 U.S.C. section 360bbb-3(b)(1), unless the authorization is terminated or revoked sooner.  Performed at [facility], [place] [address], [place], [place] [address]      Labs: BNP (last 3 results) Recent Labs    10/08/20 0802  BNP 601.0*   Basic Metabolic Panel: Recent Labs  Lab 12/31/20 0946 01/01/21 0354 01/02/21 0402  NA 139 137 137  K 4.1 4.1 4.0  CL 105 107 106  CO2 27 23 23   GLUCOSE 126* 87 94  BUN 24* 24* 26*  CREATININE 1.03 0.84 0.80  CALCIUM 9.0 8.6* 8.5*  MG  --   --  1.9  PHOS  --   --  4.0   Liver Function Tests: Recent Labs  Lab 01/01/21 0354  AST 33  ALT 19  ALKPHOS 93  BILITOT 1.0  PROT 6.2*  ALBUMIN 3.4*   No results for input(s): LIPASE, AMYLASE in the last 168 hours. No results for input(s): AMMONIA in the last 168 hours. CBC: Recent Labs  Lab 12/31/20 0946 01/01/21 0354 01/02/21 0402  WBC 7.5 7.0 5.9  NEUTROABS 4.2  --   --   HGB 11.6* 10.6* 10.6*  HCT 36.1* 31.5* 31.5*  MCV 96.5 94.9 94.6  PLT 264 236 220   Cardiac Enzymes: No results for input(s): CKTOTAL, CKMB, CKMBINDEX, TROPONINI in the last 168 hours. BNP: Invalid input(s): POCBNP CBG: No results for input(s): GLUCAP in the last 168 hours. D-Dimer No results for input(s): DDIMER in the last 72 hours. Hgb A1c No results for input(s): HGBA1C in the last 72 hours. Lipid Profile No results for input(s): CHOL, HDL, LDLCALC, TRIG, CHOLHDL, LDLDIRECT in the last 72 hours. Thyroid function studies Recent Labs    12/31/20 1208 01/01/21 1441  TSH 4.639*  --   T3FREE  --  2.2    Anemia work up No results for input(s): VITAMINB12, FOLATE, FERRITIN, TIBC, IRON, RETICCTPCT in the last 72 hours. Urinalysis    Component Value Date/Time   COLORURINE YELLOW 12/31/2020 0946   APPEARANCEUR CLEAR 12/31/2020 0946   APPEARANCEUR Cloudy (A) 06/12/2020 1143   LABSPEC 1.015 12/31/2020 0946   LABSPEC 1.015 01/02/2012 2312   PHURINE 7.0 12/31/2020 0946   GLUCOSEU NEGATIVE 12/31/2020 0946   GLUCOSEU Negative 01/02/2012 2312  HGBUR TRACE (A) 12/31/2020 0946   BILIRUBINUR NEGATIVE 12/31/2020 0946   BILIRUBINUR Negative 06/12/2020 1143   BILIRUBINUR Negative 01/02/2012 2312   KETONESUR NEGATIVE 12/31/2020 0946   PROTEINUR NEGATIVE 12/31/2020 0946   NITRITE POSITIVE (A) 12/31/2020 0946   LEUKOCYTESUR LARGE (A) 12/31/2020 0946   LEUKOCYTESUR Negative 01/02/2012 2312   Sepsis Labs Invalid input(s): PROCALCITONIN,  WBC,  LACTICIDVEN Microbiology Recent Results (from the past 240 hour(s))  Urine culture     Status: Abnormal   Collection Time: 12/31/20  9:46 AM   Specimen: Urine, Random  Result Value Ref Range Status   Specimen Description   Final    URINE, RANDOM Performed at [facility], [address] [place], [place] [address]    Special Requests   Final    NONE Performed at [facility], [address] [place], [place] [address]    Culture MULTIPLE SPECIES PRESENT, SUGGEST RECOLLECTION (A)  Final   Report Status 01/01/2021 FINAL  Final  SARS CORONAVIRUS 2 (TAT 6-24 HRS) Nasopharyngeal Nasopharyngeal Swab     Status: None   Collection Time: 12/31/20 11:13 AM   Specimen: Nasopharyngeal Swab  Result Value Ref Range Status   SARS Coronavirus 2 NEGATIVE NEGATIVE Final    Comment: (NOTE) SARS-CoV-2 target nucleic acids are NOT DETECTED.  The SARS-CoV-2 RNA is generally detectable in upper and lower respiratory specimens during the acute phase of infection. Negative results do not preclude SARS-CoV-2 infection, do not rule out co-infections  with other pathogens, and should not be used as the sole basis for treatment or other patient management decisions. Negative results must be combined with clinical observations, patient history, and epidemiological information. The expected result is Negative.  Fact Sheet for Patients: SugarRoll.be  Fact Sheet for Healthcare Providers: https://www.woods-mathews.com/  This test is not yet approved or cleared by the [place] FDA and  has been authorized for detection and/or diagnosis of SARS-CoV-2 by FDA under an Emergency Use Authorization (EUA). This EUA will remain  in effect (meaning this test can be used) for the duration of the COVID-19 declaration under Se ction 564(b)(1) of the Act, 21 U.S.C. section 360bbb-3(b)(1), unless the authorization is terminated or revoked sooner.  Performed at [facility], [place] [address], [place], [place] [address]      Time coordinating discharge: Over 30 minutes  SIGNED:   Ezekiel Slocumb, DO [facility] 01/02/2021, 12:25 PM   If 7PM-7AM, please contact night-coverage [email or web address]

## 2021-01-02 NOTE — Evaluation (Signed)
Physical Therapy Evaluation Patient Details Name: Jason Murphy MRN: [id number] DOB: 1919-11-25 Today's Date: 01/02/2021   History of Present Illness  presented to ER secondary to AMS, dizziness (now resolved); admitted for management of UTI.  Clinical Impression  Patient resting in bed upon arrival to room; alert and oriented to basic information, follows commands and agreeable for OOB activities.  Endorses mild soreness to L shoulder (s/p recent fall); unchanged with this admission.  Bilat UE/LE strength and ROM otherwise grossly symmetrical and WFL; no focal weakness appreciated.  Slight unsteadiness with limited functional reach noted in standing, cga/min assist for safety.  Able to complete bed mobility with mod indep; sit/stand, basic transfers and gait (60') with RW, cga.  Demonstrates forward flexed posture with reciprocal stepping pattern; fair step height/length.  Fair cadnece/gait speed; negotiates obstacles, turns without buckling, LOB or safety concern.  Do recommend continued use of RW with mobility efforts for optimal safety/indep; patient voices understanding/agreement and reports having one available for use at discharge. Would benefit from skilled PT to address above deficits and promote optimal return to PLOF.;Recommend transition to HHPT upon discharge from acute hospitalization.     Follow Up Recommendations Home health PT    Equipment Recommendations   (has 3WRW and quad cane)    Recommendations for Other Services       Precautions / Restrictions Precautions Precautions: Fall Restrictions Weight Bearing Restrictions: No      Mobility  Bed Mobility Overal bed mobility: Modified Independent                  Transfers Overall transfer level: Needs assistance Equipment used: Rolling walker (2 wheeled) Transfers: Sit to/from Stand Sit to Stand: Min guard;Min assist         General transfer comment: assist to stabilize RW as patient tends to pull  heavily on device to assist with lift off  Ambulation/Gait Ambulation/Gait assistance: Min guard Gait Distance (Feet): 60 Feet Assistive device: Rolling walker (2 wheeled)       General Gait Details: forward flexed posture with reciprocal stepping pattern; fair step height/length.  Fair cadnece/gait speed; negotiates obstacles, turns without buckling, LOB or safety concern.  Stairs            Wheelchair Mobility    Modified Rankin (Stroke Patients Only)       Balance Overall balance assessment: Needs assistance Sitting-balance support: No upper extremity supported;Feet supported Sitting balance-Leahy Scale: Good     Standing balance support: Bilateral upper extremity supported Standing balance-Leahy Scale: Fair                               Pertinent Vitals/Pain Pain Assessment: No/denies pain    Home Living Family/patient expects to be discharged to:: Assisted living               Home Equipment: Other (comment);Walker - 2 wheels;Grab bars - tub/shower Additional Comments: small base 3PC    Prior Function Level of Independence: Needs assistance   Gait / Transfers Assistance Needed: no AD for ambulating within his unit, 3PC for walking to the dining hall, out  ADL's / Homemaking Assistance Needed: indep with ADL, takes stand up shower, still drives, manages his own medication, does his own laundry; ALF provides meals, housekeeping; enjoys being involved in the local [employer], church, and enjoys trading stocks        Hand Dominance   Dominant Hand: Right  Extremity/Trunk Assessment   Upper Extremity Assessment Upper Extremity Assessment: Overall WFL for tasks assessed (does endorse some soreness with end-range ROM to L shoulder after recent fall)    Lower Extremity Assessment Lower Extremity Assessment: Overall WFL for tasks assessed (grossly 4/5 throughout; no focal weakness)       Communication   Communication: No difficulties   Cognition Arousal/Alertness: Awake/alert Behavior During Therapy: WFL for tasks assessed/performed Overall Cognitive Status: Within Functional Limits for tasks assessed                                        General Comments      Exercises Other Exercises Other Exercises: Educated in role of PT and progressive mobility; educated in role of orthostatic precautions (slow movement transitions, pauses with position changes), signs/symptoms and activity implications. Patient voiced understanding of all information.   Assessment/Plan    PT Assessment Patient needs continued PT services  PT Problem List Decreased strength;Decreased activity tolerance;Decreased balance;Decreased mobility;Decreased knowledge of use of DME;Decreased safety awareness;Decreased knowledge of precautions       PT Treatment Interventions DME instruction;Gait training;Functional mobility training;Therapeutic activities;Therapeutic exercise;Balance training;Patient/family education    PT Goals (Current goals can be found in the Care Plan section)  Acute Rehab PT Goals Patient Stated Goal: go back to ALF PT Goal Formulation: With patient Time For Goal Achievement: 01/16/21 Potential to Achieve Goals: Good    Frequency Min 2X/week   Barriers to discharge        Co-evaluation               AM-PAC PT "6 Clicks" Mobility  Outcome Measure Help needed turning from your back to your side while in a flat bed without using bedrails?: None Help needed moving from lying on your back to sitting on the side of a flat bed without using bedrails?: None Help needed moving to and from a bed to a chair (including a wheelchair)?: A Little Help needed standing up from a chair using your arms (e.g., wheelchair or bedside chair)?: A Little Help needed to walk in hospital room?: A Little Help needed climbing 3-5 steps with a railing? : A Little 6 Click Score: 20    End of Session Equipment Utilized  During Treatment: Gait belt Activity Tolerance: Patient tolerated treatment well Patient left: in bed;with call bell/phone within reach;with bed alarm set Nurse Communication: Mobility status PT Visit Diagnosis: Muscle weakness (generalized) (M62.81);Difficulty in walking, not elsewhere classified (R26.2)    Time: 5993-5701 PT Time Calculation (min) (ACUTE ONLY): 23 min   Charges:   PT Evaluation $PT Eval Moderate Complexity: 1 Mod PT Treatments $Therapeutic Activity: 8-22 mins       [place] Chahal H. Owens Shark, PT, DPT, NCS 01/02/21, 11:39 AM [phone]

## 2021-01-05 ENCOUNTER — Emergency Department: Payer: Medicare PPO

## 2021-01-05 ENCOUNTER — Emergency Department
Admission: EM | Admit: 2021-01-05 | Discharge: 2021-01-05 | Disposition: A | Discharge status: Home / Self Care | Payer: Medicare PPO | Attending: Emergency Medicine | Admitting: Emergency Medicine

## 2021-01-05 ENCOUNTER — Other Ambulatory Visit: Payer: Self-pay

## 2021-01-05 DIAGNOSIS — I2510 Atherosclerotic heart disease of native coronary artery without angina pectoris: Secondary | ICD-10-CM | POA: Insufficient documentation

## 2021-01-05 DIAGNOSIS — Z8546 Personal history of malignant neoplasm of prostate: Secondary | ICD-10-CM | POA: Diagnosis not present

## 2021-01-05 DIAGNOSIS — Z7982 Long term (current) use of aspirin: Secondary | ICD-10-CM | POA: Insufficient documentation

## 2021-01-05 DIAGNOSIS — M25512 Pain in left shoulder: Secondary | ICD-10-CM | POA: Diagnosis not present

## 2021-01-05 DIAGNOSIS — I10 Essential (primary) hypertension: Secondary | ICD-10-CM | POA: Insufficient documentation

## 2021-01-05 LAB — CBC
HCT: 34.1 % — ABNORMAL LOW (ref 39.0–52.0)
Hemoglobin: 11.2 g/dL — ABNORMAL LOW (ref 13.0–17.0)
MCH: 31.5 pg (ref 26.0–34.0)
MCHC: 32.8 g/dL (ref 30.0–36.0)
MCV: 95.8 fL (ref 80.0–100.0)
Platelets: 239 10*3/uL (ref 150–400)
RBC: 3.56 MIL/uL — ABNORMAL LOW (ref 4.22–5.81)
RDW: 13.3 % (ref 11.5–15.5)
WBC: 7.2 10*3/uL (ref 4.0–10.5)
nRBC: 0 % (ref 0.0–0.2)

## 2021-01-05 LAB — BASIC METABOLIC PANEL
Anion gap: 9 (ref 5–15)
BUN: 26 mg/dL — ABNORMAL HIGH (ref 8–23)
CO2: 23 mmol/L (ref 22–32)
Calcium: 8.9 mg/dL (ref 8.9–10.3)
Chloride: 105 mmol/L (ref 98–111)
Creatinine, Ser: 0.82 mg/dL (ref 0.61–1.24)
GFR, Estimated: >60 mL/min (ref >60)
Glucose, Bld: 128 mg/dL — ABNORMAL HIGH (ref 70–99)
Potassium: 4.2 mmol/L (ref 3.5–5.1)
Sodium: 137 mmol/L (ref 135–145)

## 2021-01-05 LAB — TROPONIN I (HIGH SENSITIVITY): Troponin I (High Sensitivity): 7 ng/L (ref <18)

## 2021-01-05 NOTE — ED Provider Notes (Signed)
[facility] Emergency Department Provider Note  ____________________________________________   I have reviewed the triage vital signs and the nursing notes.   HISTORY  Chief Complaint Shoulder pain  History limited by: Not Limited   HPI Jason Murphy is a 87 y.o. male who presents to the emergency department today because of concerns for left shoulder pain.  Patient states that he suffered a fall roughly 4 weeks ago and landed onto his left arm.  He states initially had pain primarily in his left wrist.  While the left wrist has improved he has also had pain in his left shoulder which is progressively gotten worse.  He does state it is worse with certain movements and trying to lie on it.  He denied any numbness or tingling going down that arm.  Additionally the patient is recently been hospitalized and treated for UTI.  Patient however denies any current dysuria or bad odor to his urine.   Records reviewed. Per medical record review patient has a history of recent hospitalization for AMS/UTI.  Past Medical History:  Diagnosis Date  . Cancer [facility])    prostate  . Coronary artery disease   . Dizziness    chronic dizziness  . Dyspnea   . Fatigue    Progressive fatigue with question of sleep apnea  . Hypercholesterolemia   . Hypertension   . Obstructive sleep apnea   . Pneumonia   . Restless leg syndrome     Patient Active Problem List   Diagnosis Date Noted  . Protein-calorie malnutrition, severe 01/01/2021  . AMS (altered mental status) 12/31/2020  . Sepsis ([place]) 10/06/2020  . Syncope   . Acute cystitis without hematuria   . Goals of care, counseling/discussion 08/15/2020  . Hypertension   . Coronary artery disease   . RLS (restless legs syndrome) 09/21/2017  . Paroxysmal A-fib ([place]) 01/08/2016  . Mixed hyperlipidemia 01/08/2016  . Hyponatremia 01/03/2016  . Anxiety state 02/03/2015  . Stress at home 02/03/2015  . OBSTRUCTIVE SLEEP APNEA  10/22/2010  . PROSTATE CANCER 10/21/2010  . HYPERCHOLESTEROLEMIA 10/21/2010  . HYPERTENSION 10/21/2010  . CORONARY ARTERY DISEASE 10/21/2010  . DIZZINESS, CHRONIC 10/21/2010    Past Surgical History:  Procedure Laterality Date  . APPENDECTOMY    . CARDIAC CATHETERIZATION     Ejection Fraction was 60%, stent  . KNEE SURGERY     arthroscopic left knee surgery  . SHOULDER ARTHROSCOPY      Prior to Admission medications   Medication Sig Start Date End Date Taking? Authorizing Provider  AMBULATORY NON FORMULARY MEDICATION Trimix (30/1/10)-(Pap/Phent/PGE)  Test Dose  65ml vial   Qty #3 [place] [phone] Fax [phone] 04/29/20   Hollice Espy, MD  aspirin EC 81 MG tablet Take 81 mg by mouth at bedtime.    [provider]  cefdinir (OMNICEF) 300 MG capsule Take 1 capsule (300 mg total) by mouth 2 (two) times daily for 3 days. 01/02/21 01/05/21  Ezekiel Slocumb, DO  Cholecalciferol (VITAMIN D3) 1000 units CAPS Take 1,000 Units by mouth daily.    [provider]  co-enzyme Q-10 50 MG capsule Take 100 mg by mouth every morning.    [provider]  cyanocobalamin 500 MCG tablet Take 500 mcg by mouth every morning.    [provider]  feeding supplement (ENSURE ENLIVE / ENSURE PLUS) LIQD Take 237 mLs by mouth 3 (three) times daily between meals. 01/02/21   Ezekiel Slocumb, DO  isosorbide mononitrate (IMDUR) 30 MG 24 hr tablet Take 30 mg by mouth daily. 04/09/20   [provider]  levothyroxine (SYNTHROID, LEVOTHROID) 50 MCG tablet Take 50 mcg by mouth daily before breakfast.    [provider]  meclizine (ANTIVERT) 25 MG tablet Take 25 mg by mouth 2 (two) times daily as needed for dizziness.    [provider]  Multiple Vitamins-Minerals (PRESERVISION AREDS 2 PO) Take 1 tablet by mouth daily.    [provider]  nitroGLYCERIN (NITROSTAT) 0.4 MG SL tablet Place 0.4 mg under the tongue every  5 (five) minutes as needed for chest pain.    [provider]  omeprazole (PRILOSEC) 20 MG capsule Take 20 mg by mouth daily.    [provider]  rOPINIRole (REQUIP) 1 MG tablet Take 1 mg by mouth 3 (three) times daily. 03/10/20   [provider]  sildenafil (VIAGRA) 50 MG tablet Take 50 mg by mouth as needed. 04/17/20 05/17/20  [provider]  simvastatin (ZOCOR) 40 MG tablet TAKE 1 TABLET BY MOUTH EVERY NIGHT AT BEDTIME Patient taking differently: Take 40 mg by mouth daily at 6 PM. 05/26/11   Martinique, Peter M, MD  pramipexole (MIRAPEX) 0.5 MG tablet Take 0.5 mg by mouth at bedtime. 09/18/17 03/15/20  [provider]    Allergies Patient has no known allergies.  Family History  Problem Relation Age of Onset  . COPD Mother     Social History Social History   Tobacco Use  . Smoking status: Never Smoker  . Smokeless tobacco: Never Used  Vaping Use  . Vaping Use: Never used  Substance Use Topics  . Alcohol use: Yes    Comment: 1/2 glass wine daily  . Drug use: No    Review of Systems Constitutional: No fever/chills Eyes: No visual changes. ENT: No sore throat. Cardiovascular: Denies chest pain. Respiratory: Denies shortness of breath. Gastrointestinal: No abdominal pain.  No nausea, no vomiting.  No diarrhea.   Genitourinary: Negative for dysuria. Musculoskeletal: Positive for left shoulder pain. Skin: Negative for rash. Neurological: Negative for headaches, focal weakness or numbness.  ____________________________________________   PHYSICAL EXAM:  VITAL SIGNS: ED Triage Vitals  Enc Vitals Group     BP 01/05/21 1415 (!) 110/58     Pulse Rate 01/05/21 1415 66     Resp 01/05/21 1415 18     Temp 01/05/21 1415 98.2 F (36.8 C)     Temp src --      SpO2 01/05/21 1415 96 %     Weight --      Height --      Head Circumference --      Peak Flow --      Pain Score 01/05/21 1352 7   Constitutional: Alert and oriented.  Eyes:  Conjunctivae are normal.  ENT      Head: Normocephalic and atraumatic.      Nose: No congestion/rhinnorhea.      Mouth/Throat: Mucous membranes are moist.      Neck: No stridor. Hematological/Lymphatic/Immunilogical: No cervical lymphadenopathy. Cardiovascular: Normal rate, regular rhythm.  No murmurs, rubs, or gallops.  Respiratory: Normal respiratory effort without tachypnea nor retractions. Breath sounds are clear and equal bilaterally. No wheezes/rales/rhonchi. Gastrointestinal: Soft and non tender. No rebound. No guarding.  Genitourinary: Deferred Musculoskeletal: No obvious deformity to left shoulder. Slightly limited ROM secondary to pain. NV intact distally.  Neurologic:  Normal speech and language. No gross focal neurologic deficits are appreciated.  Skin:  Skin is warm, dry and intact. No rash noted. Psychiatric: Mood and affect are normal. Speech and behavior are normal. Patient exhibits appropriate insight and judgment.  ____________________________________________    LABS (pertinent positives/negatives)  BMP wnl except glu 128, BUN 26 CBC wbc 7.2, hgb 11.2, plt 239 Trop hs 7 ____________________________________________   EKG  I, Nance Pear, attending physician, personally viewed and interpreted this EKG  EKG Time: 1419 Rate: 63 Rhythm: normal sinus rhythm Axis: left axis deviation Intervals: qtc 472 QRS: Non specific IVCD ST changes: no st elevation Impression: abnormal ekg   ____________________________________________    RADIOLOGY  Left shoulder Metastatic lesions. No acute abnormality  ____________________________________________   PROCEDURES  Procedures  ____________________________________________   INITIAL IMPRESSION / ASSESSMENT AND PLAN / ED COURSE  Pertinent labs & imaging results that were available during my care of the patient were reviewed by me and considered in my medical decision making (see chart for details).   Patient  presented to the emergency department today because of concerns for left shoulder pain after a fall.  X-ray does not show any acute fracture.  Given that the fall occurred 4 weeks ago and that the pain is only minimally restricting range of motion I do think it is likely that patient has suffered either a rotator cuff or ligamentous type injury.  Will give patient orthopedic follow-up.  Terms of the urinary tract infection patient is asymptomatic at this time.  He is already on antibiotics.  No leukocytosis in blood work and patient is afebrile at this time. Encourage patient to continue antibiotics. ____________________________________________   FINAL CLINICAL IMPRESSION(S) / ED DIAGNOSES  Final diagnoses:  Left shoulder pain, unspecified chronicity     Note: This dictation was prepared with Dragon dictation. Any transcriptional errors that result from this process are unintentional     Nance Pear, MD 01/05/21 [phone]

## 2021-01-05 NOTE — ED Notes (Signed)
Pt waiting on ems for transport to [facility].

## 2021-01-05 NOTE — Discharge Instructions (Addendum)
Please continue to take your antibiotics for the urinary tract infection. Please follow up with orthopedics. Please seek medical attention for any high fevers, chest pain, shortness of breath, change in behavior, persistent vomiting, bloody stool or any other new or concerning symptoms.

## 2021-01-05 NOTE — ED Notes (Signed)
Sent [facility] to lab. 

## 2021-01-05 NOTE — ED Triage Notes (Signed)
Pt comes via EMS form [facility]. Pt has possible UTI and prescribed meds and unsure if pt has been taking or given meds.  Pt states shoulder pain to left for 4 weeks since falling.  BP-130/75 HR-61 O2-96%RA

## 2021-01-05 NOTE — ED Notes (Signed)
Pt has left shoulder pain   Pt reports falling 4 weeks ago.  Pt is a resident at [facility] and ambulates with a cane sometimes per pt   Pt denies chest pain or sob.  Pt alert and oriented.  Pt has pain with movement of left arm.  No deformity noted.

## 2021-01-05 NOTE — ED Notes (Signed)
Pt waiting on ems for transport  

## 2021-02-08 ENCOUNTER — Ambulatory Visit: Payer: Self-pay

## 2021-02-08 ENCOUNTER — Encounter: Payer: Self-pay | Admitting: Urology

## 2021-02-09 ENCOUNTER — Emergency Department: Payer: Medicare PPO

## 2021-02-09 ENCOUNTER — Other Ambulatory Visit: Payer: Self-pay

## 2021-02-09 ENCOUNTER — Emergency Department
Admission: EM | Admit: 2021-02-09 | Discharge: 2021-02-09 | Disposition: A | Discharge status: Home / Self Care | Payer: Medicare PPO | Attending: Emergency Medicine | Admitting: Emergency Medicine

## 2021-02-09 DIAGNOSIS — Z8546 Personal history of malignant neoplasm of prostate: Secondary | ICD-10-CM | POA: Diagnosis not present

## 2021-02-09 DIAGNOSIS — Z7982 Long term (current) use of aspirin: Secondary | ICD-10-CM | POA: Diagnosis not present

## 2021-02-09 DIAGNOSIS — N390 Urinary tract infection, site not specified: Secondary | ICD-10-CM

## 2021-02-09 DIAGNOSIS — I2510 Atherosclerotic heart disease of native coronary artery without angina pectoris: Secondary | ICD-10-CM | POA: Insufficient documentation

## 2021-02-09 DIAGNOSIS — R079 Chest pain, unspecified: Secondary | ICD-10-CM | POA: Insufficient documentation

## 2021-02-09 DIAGNOSIS — I10 Essential (primary) hypertension: Secondary | ICD-10-CM | POA: Insufficient documentation

## 2021-02-09 DIAGNOSIS — M542 Cervicalgia: Secondary | ICD-10-CM | POA: Diagnosis not present

## 2021-02-09 DIAGNOSIS — R42 Dizziness and giddiness: Secondary | ICD-10-CM

## 2021-02-09 LAB — BASIC METABOLIC PANEL
Anion gap: 9 (ref 5–15)
BUN: 24 mg/dL — ABNORMAL HIGH (ref 8–23)
CO2: 26 mmol/L (ref 22–32)
Calcium: 8.9 mg/dL (ref 8.9–10.3)
Chloride: 105 mmol/L (ref 98–111)
Creatinine, Ser: 0.93 mg/dL (ref 0.61–1.24)
GFR, Estimated: >60 mL/min (ref >60)
Glucose, Bld: 118 mg/dL — ABNORMAL HIGH (ref 70–99)
Potassium: 4.1 mmol/L (ref 3.5–5.1)
Sodium: 140 mmol/L (ref 135–145)

## 2021-02-09 LAB — URINALYSIS, COMPLETE (UACMP) WITH MICROSCOPIC
Bacteria, UA: NONE SEEN
Bilirubin Urine: NEGATIVE
Glucose, UA: NEGATIVE mg/dL
Hgb urine dipstick: NEGATIVE
Ketones, ur: NEGATIVE mg/dL
Nitrite: POSITIVE — AB
Protein, ur: NEGATIVE mg/dL
Specific Gravity, Urine: 1.038 — ABNORMAL HIGH (ref 1.005–1.030)
Squamous Epithelial / HPF: NONE SEEN (ref 0–5)
WBC, UA: >50 WBC/hpf — ABNORMAL HIGH (ref 0–5)
pH: 6 (ref 5.0–8.0)

## 2021-02-09 LAB — CBC
HCT: 35.2 % — ABNORMAL LOW (ref 39.0–52.0)
Hemoglobin: 11.6 g/dL — ABNORMAL LOW (ref 13.0–17.0)
MCH: 31.3 pg (ref 26.0–34.0)
MCHC: 33 g/dL (ref 30.0–36.0)
MCV: 94.9 fL (ref 80.0–100.0)
Platelets: 258 10*3/uL (ref 150–400)
RBC: 3.71 MIL/uL — ABNORMAL LOW (ref 4.22–5.81)
RDW: 13.7 % (ref 11.5–15.5)
WBC: 6.3 10*3/uL (ref 4.0–10.5)
nRBC: 0 % (ref 0.0–0.2)

## 2021-02-09 LAB — TROPONIN I (HIGH SENSITIVITY)
Troponin I (High Sensitivity): 7 ng/L (ref <18)
Troponin I (High Sensitivity): 6 ng/L (ref <18)

## 2021-02-09 MED ORDER — CEPHALEXIN 500 MG PO CAPS
500.0000 mg | ORAL_CAPSULE | Freq: Once | ORAL | Status: AC
  Administered 2021-02-09: 500 mg via ORAL
  Filled 2021-02-09: qty 1

## 2021-02-09 MED ORDER — ACETAMINOPHEN 500 MG PO TABS
1000.0000 mg | ORAL_TABLET | ORAL | Status: AC
  Administered 2021-02-09: 1000 mg via ORAL
  Filled 2021-02-09: qty 2

## 2021-02-09 MED ORDER — IOHEXOL 350 MG/ML SOLN
75.0000 mL | Freq: Once | INTRAVENOUS | Status: AC | PRN
  Administered 2021-02-09: 75 mL via INTRAVENOUS

## 2021-02-09 MED ORDER — ASPIRIN 81 MG PO CHEW
324.0000 mg | CHEWABLE_TABLET | Freq: Once | ORAL | Status: AC
  Administered 2021-02-09: 324 mg via ORAL
  Filled 2021-02-09: qty 4

## 2021-02-09 MED ORDER — CEPHALEXIN 250 MG PO CAPS: 250.0000 mg | ORAL_CAPSULE | Freq: Two times a day (BID) | ORAL | 0 refills | Status: AC

## 2021-02-09 NOTE — ED Notes (Signed)
Patient able to ambulate to [facility], standby assist. Patient voided very small amount for urine sample. Reports he does not typically cath during the day, but will sometimes cath before bed to help him get a full night's sleep.

## 2021-02-09 NOTE — ED Provider Notes (Signed)
[facility] Emergency Department Provider Note   ____________________________________________   Event Date/Time   First MD Initiated Contact with Patient 02/09/21 1011     (approximate)  I have reviewed the triage vital signs and the nursing notes.   HISTORY  Chief Complaint Chest Pain and Neck Pain    HPI Jason Murphy is a 87 y.o. male with a history of coronary disease in the distant past, one stent about 20 years ago according to his daughter, hypertension, pneumonia, urinary tract infection, chronic dizziness  Today the patient was at breakfast, at about 8:45 AM he reports he started feeling suddenly dizzy.  He was seated.  Along with the dizziness he noticed a feeling of slight chest discomfort in his left upper chest.  The dizziness felt like a spinning feeling.  All symptoms went away within about 15 minutes.  EMS was called, and patient now reports that he feels fine his symptoms are gone.  His only complaint at this point is that of left neck pain, he reports he has been experiencing this for at least about a month and a half or longer after he had a fall.  Reports his doctor did an x-ray of the neck and it looked okay, and his daughter reports that the doctor did evaluate him for this at one point recently  He was hospitalized for urinary tract infection previous.  He denies any urinary symptoms.  No abdominal pain no fevers no chills  Reports all symptoms are better except he is still continuing to have left neck pain especially when he turns his head side to side but denies any new fall or injury   Past Medical History:  Diagnosis Date  . Cancer [facility])    prostate  . Coronary artery disease   . Dizziness    chronic dizziness  . Dyspnea   . Fatigue    Progressive fatigue with question of sleep apnea  . Hypercholesterolemia   . Hypertension   . Obstructive sleep apnea   . Pneumonia   . Restless leg syndrome     Patient Active Problem  List   Diagnosis Date Noted  . Protein-calorie malnutrition, severe 01/01/2021  . AMS (altered mental status) 12/31/2020  . Sepsis ([place]) 10/06/2020  . Syncope   . Acute cystitis without hematuria   . Goals of care, counseling/discussion 08/15/2020  . Hypertension   . Coronary artery disease   . RLS (restless legs syndrome) 09/21/2017  . Paroxysmal A-fib ([place]) 01/08/2016  . Mixed hyperlipidemia 01/08/2016  . Hyponatremia 01/03/2016  . Anxiety state 02/03/2015  . Stress at home 02/03/2015  . OBSTRUCTIVE SLEEP APNEA 10/22/2010  . PROSTATE CANCER 10/21/2010  . HYPERCHOLESTEROLEMIA 10/21/2010  . HYPERTENSION 10/21/2010  . CORONARY ARTERY DISEASE 10/21/2010  . DIZZINESS, CHRONIC 10/21/2010    Past Surgical History:  Procedure Laterality Date  . APPENDECTOMY    . CARDIAC CATHETERIZATION     Ejection Fraction was 60%, stent  . KNEE SURGERY     arthroscopic left knee surgery  . SHOULDER ARTHROSCOPY      Prior to Admission medications   Medication Sig Start Date End Date Taking? Authorizing Provider  cephALEXin (KEFLEX) 250 MG capsule Take 1 capsule (250 mg total) by mouth 2 (two) times daily for 7 days. 02/09/21 02/16/21 Yes Delman Kitten, MD  AMBULATORY NON FORMULARY MEDICATION Trimix (30/1/10)-(Pap/Phent/PGE)  Test Dose  66ml vial   [address] [place] [phone] Fax [phone] 04/29/20  Hollice Espy, MD  aspirin EC 81 MG tablet Take 81 mg by mouth at bedtime.    [provider]  Cholecalciferol (VITAMIN D3) 1000 units CAPS Take 1,000 Units by mouth daily.    [provider]  co-enzyme Q-10 50 MG capsule Take 100 mg by mouth every morning.    [provider]  cyanocobalamin 500 MCG tablet Take 500 mcg by mouth every morning.    [provider]  feeding supplement (ENSURE ENLIVE / ENSURE PLUS) LIQD Take 237 mLs by mouth 3 (three) times daily between meals. 01/02/21   Ezekiel Slocumb, DO  isosorbide mononitrate (IMDUR)  30 MG 24 hr tablet Take 30 mg by mouth daily. 04/09/20   [provider]  levothyroxine (SYNTHROID, LEVOTHROID) 50 MCG tablet Take 50 mcg by mouth daily before breakfast.    [provider]  meclizine (ANTIVERT) 25 MG tablet Take 25 mg by mouth 2 (two) times daily as needed for dizziness.    [provider]  Multiple Vitamins-Minerals (PRESERVISION AREDS 2 PO) Take 1 tablet by mouth daily.    [provider]  nitroGLYCERIN (NITROSTAT) 0.4 MG SL tablet Place 0.4 mg under the tongue every 5 (five) minutes as needed for chest pain.    [provider]  omeprazole (PRILOSEC) 20 MG capsule Take 20 mg by mouth daily.    [provider]  rOPINIRole (REQUIP) 1 MG tablet Take 1 mg by mouth 3 (three) times daily. 03/10/20   [provider]  sildenafil (VIAGRA) 50 MG tablet Take 50 mg by mouth as needed. 04/17/20 05/17/20  [provider]  simvastatin (ZOCOR) 40 MG tablet TAKE 1 TABLET BY MOUTH EVERY NIGHT AT BEDTIME Patient taking differently: Take 40 mg by mouth daily at 6 PM. 05/26/11   Martinique, Peter M, MD  pramipexole (MIRAPEX) 0.5 MG tablet Take 0.5 mg by mouth at bedtime. 09/18/17 03/15/20  [provider]    Allergies Patient has no known allergies.  Family History  Problem Relation Age of Onset  . COPD Mother     Social History Social History   Tobacco Use  . Smoking status: Never Smoker  . Smokeless tobacco: Never Used  Vaping Use  . Vaping Use: Never used  Substance Use Topics  . Alcohol use: Yes    Comment: 1/2 glass wine daily  . Drug use: No    Review of Systems Constitutional: No fever/chills Eyes: No visual changes. ENT: No sore throat.  See HPI Cardiovascular: See HPI.  He cannot describe the chest discomfort well reports it was a mild feeling in his left upper chest that did not radiate.  It is now resolved completely.  He does report at times if he turns his neck side to side he will notice the pain in  the left upper chest seems to come on from that.  Respiratory: Denies shortness of breath. Gastrointestinal: No abdominal pain.   Genitourinary: Negative for dysuria. Musculoskeletal: Negative for back pain. Skin: Negative for rash. Neurological: Negative for headaches, areas of focal weakness or numbness.    ____________________________________________   PHYSICAL EXAM:  VITAL SIGNS: ED Triage Vitals  Enc Vitals Group     BP 02/09/21 0945 (!) 130/57     Pulse Rate 02/09/21 0945 (!) 53     Resp 02/09/21 0945 17     Temp 02/09/21 0945 97.6 F (36.4 C)     Temp Source 02/09/21 0945 Oral     SpO2 02/09/21 0945 97 %  Weight 02/09/21 0949 158 lb 11.7 oz (72 kg)     Height 02/09/21 0949 5\' 9"  (1.753 m)     Head Circumference --      Peak Flow --      Pain Score 02/09/21 0948 0     Pain Loc --      Pain Edu? --      Excl. in [place]? --     Constitutional: Alert and oriented. Well appearing and in no acute distress. Eyes: Conjunctivae are normal. Head: Atraumatic. Nose: No congestion/rhinnorhea. Mouth/Throat: Mucous membranes are moist. Neck: No stridor.  No nuchal rigidity, however when he turns his head especially towards the left side he will notice a sharp pain in the left neck mid neck.  When he does this he also reports that radiate discomfort towards his left upper chest pectoralis region.  This seems to reproduce what he describes as his chest pain during his episode of dizziness earlier. Cardiovascular: Normal rate, regular rhythm. Grossly normal heart sounds.  Good peripheral circulation.  Some tenderness to palpation of the left left neck along the sternocleidomastoid region but also reports of pain over his left occipital and mastoid region Respiratory: Normal respiratory effort.  No retractions. Lungs CTAB. Gastrointestinal: Soft and nontender. No distention. Musculoskeletal: No lower extremity tenderness nor edema. Neurologic:  Normal speech and language. No gross  focal neurologic deficits are appreciated.  Normal cranial nerve exam.  5 out of 5 strength in all extremities.  No noted deficits.  Clear speech. Skin:  Skin is warm, dry and intact. No rash noted. Psychiatric: Mood and affect are normal. Speech and behavior are normal.  ____________________________________________   LABS (all labs ordered are listed, but only abnormal results are displayed)  Labs Reviewed  BASIC METABOLIC PANEL - Abnormal; Notable for the following components:      Result Value   Glucose, Bld 118 (*)    BUN 24 (*)    All other components within normal limits  CBC - Abnormal; Notable for the following components:   RBC 3.71 (*)    Hemoglobin 11.6 (*)    HCT 35.2 (*)    All other components within normal limits  URINALYSIS, COMPLETE (UACMP) WITH MICROSCOPIC - Abnormal; Notable for the following components:   Color, Urine YELLOW (*)    APPearance CLOUDY (*)    Specific Gravity, Urine 1.038 (*)    Nitrite POSITIVE (*)    Leukocytes,Ua LARGE (*)    WBC, UA >50 (*)    All other components within normal limits  URINE CULTURE  TROPONIN I (HIGH SENSITIVITY)  TROPONIN I (HIGH SENSITIVITY)   ____________________________________________  EKG  Reviewed inter by me at 950 Heart rate 59 QRS 159 QTc 450 Normal sinus rhythm, left bundle branch block.  No obvious ischemia ____________________________________________  RADIOLOGY  CT ANGIO HEAD NECK W WO CM  Result Date: 02/09/2021 CLINICAL DATA:  Left neck pain after fall 4 weeks ago. EXAM: CT ANGIOGRAPHY HEAD AND NECK TECHNIQUE: Multidetector CT imaging of the head and neck was performed using the standard protocol during bolus administration of intravenous contrast. Multiplanar CT image reconstructions and MIPs were obtained to evaluate the vascular anatomy. Carotid stenosis measurements (when applicable) are obtained utilizing NASCET criteria, using the distal internal carotid diameter as the denominator. CONTRAST:   44mL OMNIPAQUE IOHEXOL 350 MG/ML SOLN COMPARISON:  CT head 12/31/2020 FINDINGS: CT HEAD FINDINGS Brain: Generalized mild atrophy. Mild patchy white matter hypodensity bilaterally. Negative for acute infarct, hemorrhage, mass. Vascular: Atherosclerotic  calcification in the carotid and vertebral arteries. Negative for hyperdense vessel Skull: Multiple sclerotic skull lesions are present similar to the prior study from metastatic prostate cancer. Correlate with PSA. Sinuses: Negative Orbits: Negative Review of the MIP images confirms the above findings CTA NECK FINDINGS Aortic arch: Atherosclerotic calcification aortic arch and proximal great vessels. Limited evaluation of the arch which was largely excluded from the study. Right carotid system: Mild atherosclerotic calcification right carotid bulb. No significant carotid stenosis or dissection Left carotid system: Mild atherosclerotic disease left carotid bifurcation. Negative for stenosis or dissection. No aneurysm Vertebral arteries: Left vertebral artery dominant. Proximal and mid vertebral arteries are widely patent. There is calcific stenosis of the distal vertebral artery bilaterally. Skeleton: Cervical spondylosis. Multiple sclerotic lesions are seen in the cervical and thoracic spine compatible with widespread metastatic disease from prostate cancer. Lesions also present in the mandibular condyle bilaterally. Other neck: No soft tissue mass or adenopathy. Upper chest: Lung apices clear bilaterally. Review of the MIP images confirms the above findings CTA HEAD FINDINGS Anterior circulation: Atherosclerotic calcification throughout the cavernous carotid bilaterally with mild stenosis bilaterally. Anterior and middle cerebral arteries patent without stenosis bilaterally. No large vessel occlusion. Atrophy and chronic microvascular ischemic change in the white matter. Posterior circulation: Severe calcific stenosis right V4 segment. Right PICA patent. Moderate  stenosis left V4 segment. Left PICA patent. Basilar widely patent. Superior cerebellar and posterior cerebral arteries patent bilaterally without stenosis. Venous sinuses: Normal venous enhancement Anatomic variants: None Review of the MIP images confirms the above findings IMPRESSION: 1. No acute intracranial abnormality 2. Multiple sclerotic skeletal lesions in the skull, mandible, and spine consistent with metastatic prostate cancer. 3. Mild atherosclerotic disease in the carotid artery bilaterally. Negative for carotid stenosis or dissection 4. Severe calcific stenosis right V4 segment and moderate calcific stenosis left V4 segment 5. No intracranial stenosis or large vessel occlusion. Electronically Signed   By: Franchot Gallo M.D.   On: 02/09/2021 12:50   DG Chest 2 View  Result Date: 02/09/2021 CLINICAL DATA:  Chest pain EXAM: CHEST - 2 VIEW COMPARISON:  October 08, 2020 FINDINGS: There are several ill-defined nodular opacities throughout the left lung, largest measuring just over 1 cm. Area of subtle airspace opacity in the lateral right mid lung noted. Ill-defined airspace opacity also noted in left apex. Heart size and pulmonary vascularity are normal. No adenopathy. There is aortic atherosclerosis. There is degenerative change in each shoulder. IMPRESSION: Ill-defined nodular opacities in the left lung, potentially representing neoplastic foci. Chest CT, ideally with intravenous contrast is felt to be warranted given this appearance. Ill-defined airspace opacity concerning for pneumonia lateral right mid lung. There is also ill-defined opacity in the left apex region, a potential second focus of pneumonia. Heart size normal. No adenopathy. Aortic Atherosclerosis (ICD10-I70.0). Electronically Signed   By: Lowella Grip III M.D.   On: 02/09/2021 11:03   CT Chest W Contrast  Result Date: 02/09/2021 CLINICAL DATA:  Soft tissue mass in the chest, question of nodules seen on recent chest x-ray in  this patient with known prostate cancer metastatic disease to bone. EXAM: CT CHEST WITH CONTRAST TECHNIQUE: Multidetector CT imaging of the chest was performed during intravenous contrast administration. CONTRAST:  5mL OMNIPAQUE IOHEXOL 350 MG/ML SOLN COMPARISON:  Prior chest x-rays. No recent CT of the chest for comparison. FINDINGS: Cardiovascular: Calcified noncalcified atheromatous plaque in the thoracic aorta. Normal heart size without pericardial effusion. Three-vessel coronary artery disease. Central pulmonary vasculature normal caliber. Mediastinum/Nodes: No  pathologic size lymph nodes in the chest. 8 mm LEFT paratracheal lymph node and scattered small lymph nodes in the lower mediastinum. Mild fullness of subcarinal nodal tissue. No thoracic inlet lymphadenopathy. No axillary lymphadenopathy. No hilar lymphadenopathy. Lungs/Pleura: Pulmonary emphysema. Granuloma in the RIGHT mid chest anteriorly in the RIGHT upper lobe. Basilar atelectasis. Airways are patent. No sign of pleural effusion. Upper Abdomen: Low-density hepatic lesions appears similar to prior imaging. Liver not well assessed. Dominant low-density the lesion compatible with a cyst seventeen Hounsfield units. Multiple cysts were seen on previous imaging. Low attenuation of hepatic parenchyma is suggested with nodular hepatic contours. Post cholecystectomy. No acute upper abdominal process Musculoskeletal: Diffuse skeletal metastatic disease, diffuse involvement of the RIGHT proximal humerus partially imaged. Bilateral scapular, clavicular and sternal involvement. Bilateral rib involvement with sclerotic foci at every level in the spine dominant area at the T11 level in the spine measuring approximately 2.8 cm. Lesions are more sclerotic than on previous imaging. The T11 lesion was not present on the prior study. Multiples in new sclerotic foci at the lower portion of the thoracic spine. No acute bone finding. IMPRESSION: 1. Increase in number  and size of sclerotic skeletal metastatic disease particularly in the spine, comparison made with August of 2021 in the lower thoracic spine. 2. No signs of pulmonary metastatic disease 3. Three-vessel coronary artery disease. 4. Multiple hepatic lesions shown to represent cyst on previous imaging not well evaluated currently but grossly unchanged on a background of hepatic steatosis. 5. Emphysema and aortic atherosclerosis. Aortic Atherosclerosis (ICD10-I70.0) and Emphysema (ICD10-J43.9). Electronically Signed   By: Zetta Bills M.D.   On: 02/09/2021 13:09     ____________________________________________   PROCEDURES  Procedure(s) performed: None  Procedures  Critical Care performed: No  ____________________________________________   INITIAL IMPRESSION / ASSESSMENT AND PLAN / ED COURSE  Pertinent labs & imaging results that were available during my care of the patient were reviewed by me and considered in my medical decision making (see chart for details).   1) episode of dizziness.  He described a somewhat vertigo-like symptom, but then also tells me that he had chronic dizziness sometimes happening up to once a month.  His daughter also affirms a history of chronic dizziness that seems to be alleviated by laying down.  He has no evidence of neurologic deficit.  Because of his vertigo-like symptoms associated neck pain I will proceed with CT scan including CT angiogram to further evaluate evaluate for acute atherosclerotic disease carotid dissection etc. or perhaps traumatic injury from a previous fall.  Very low pretest probability for stroke.  2) chest pain.  Appears to be fairly reproducible, seems to reproduce with motion of the neck and I suspect that musculoskeletal may be the etiology.  He has a left bundle branch block.  His chest pain is completely resolved.  We will provide aspirin and Tylenol.  Check troponins.  Does have a distant history of coronary disease with reportedly 1  stent about 20 years ago per daughter  ----------------------------------------- 2:40 PM on 02/09/2021 -----------------------------------------  Patient fully alert resting comfortably.  Reviewed his work-up, and at this point suspect he may have a UTI.  He does self catheterize.  We will place him on 1 week of cephalexin, discussed with both the patient as well as his daughter [name].  Both comfortable with this plan.  Patient asymptomatic at this time.  No further chest pain or dizziness.  Reports he feels well and would like to be discharged.  [name] coming to pick him up.  We also discussed his significant disease burden of what appears to be metastatic prostate cancer on his CT imaging, and he as well as [name] are both very well aware of this and he follows with Dr. Erlene Quan.  Patient reports that he is resigned to having prostate cancer given his age, and not planning to undergo any sort of acute therapies.  Seems very reasonable plan given his age and stated goals.  Return precautions and treatment recommendations and follow-up discussed with the patient who is agreeable with the plan.  Patient will follow up with Dr. Wynetta Emery as well as Dr. Erlene Quan.      ____________________________________________   FINAL CLINICAL IMPRESSION(S) / ED DIAGNOSES  Final diagnoses:  Episodic lightheadedness  Lower urinary tract infection, acute        Note:  This document was prepared using Dragon voice recognition software and may include unintentional dictation errors       Delman Kitten, MD 02/09/21 1441

## 2021-02-09 NOTE — ED Triage Notes (Signed)
BIBA from [facility] c/o CP and dizziness since this AM. Also endorses neck pain with movement s/p fall several weeks ago. Patient awake alert in NAD.

## 2021-02-11 LAB — URINE CULTURE

## 2021-02-13 NOTE — Progress Notes (Deleted)
[place]  [phone] [phone]  ID: Jason Murphy OB: August 13, 1920  MR#: [id number]  IRC#:[id number]  Patient Care Team: Baxter Hire, MD as PCP - General (Internal Medicine) Lloyd Huger, MD as Consulting Physician (Oncology)  CHIEF COMPLAINT: Stage IV prostate cancer.  INTERVAL HISTORY: Patient is a 87 year old male with a history of stage IV prostate cancer with widespread bony metastasis.  He recently received Eligard treatment from urology and was referred for further evaluation and consideration of Zometa for his bony disease.  He currently feels well and is asymptomatic.  He does not complain of pain today.  He has no neurologic complaints.  He denies any recent fevers or illnesses.  He has a good appetite and denies weight loss.  He has no chest pain, shortness of breath, cough, or hemoptysis.  He denies any nausea, vomiting, constipation, or diarrhea.  He has no urinary complaints.  Patient feels at his baseline and offers no specific complaints today.  REVIEW OF SYSTEMS:   Review of Systems  Constitutional: Negative.  Negative for fever, malaise/fatigue and weight loss.  Respiratory: Negative.  Negative for cough, hemoptysis and shortness of breath.   Cardiovascular: Negative.  Negative for chest pain and leg swelling.  Gastrointestinal: Negative.  Negative for abdominal pain.  Genitourinary: Negative.  Negative for dysuria.  Musculoskeletal: Negative.  Negative for back pain.  Skin: Negative.  Negative for rash.  Neurological: Negative.  Negative for dizziness, focal weakness, weakness and headaches.  Psychiatric/Behavioral: Negative.  The patient is not nervous/anxious.     As per HPI. Otherwise, a complete review of systems is negative.  PAST MEDICAL HISTORY: Past Medical History:  Diagnosis Date  . Cancer [facility])    prostate  . Coronary artery disease   . Dizziness    chronic dizziness  . Dyspnea   . Fatigue     Progressive fatigue with question of sleep apnea  . Hypercholesterolemia   . Hypertension   . Obstructive sleep apnea   . Pneumonia   . Restless leg syndrome     PAST SURGICAL HISTORY: Past Surgical History:  Procedure Laterality Date  . APPENDECTOMY    . CARDIAC CATHETERIZATION     Ejection Fraction was 60%, stent  . KNEE SURGERY     arthroscopic left knee surgery  . SHOULDER ARTHROSCOPY      FAMILY HISTORY: Family History  Problem Relation Age of Onset  . COPD Mother     ADVANCED DIRECTIVES (Y/N):  N  HEALTH MAINTENANCE: Social History   Tobacco Use  . Smoking status: Never Smoker  . Smokeless tobacco: Never Used  Vaping Use  . Vaping Use: Never used  Substance Use Topics  . Alcohol use: Yes    Comment: 1/2 glass wine daily  . Drug use: No     Colonoscopy:  PAP:  Bone density:  Lipid panel:  No Known Allergies  Current Outpatient Medications  Medication Sig Dispense Refill  . AMBULATORY NON FORMULARY MEDICATION Trimix (30/1/10)-(Pap/Phent/PGE)  Test Dose  46ml vial   Qty #3 Refills 0  [place] [phone] Fax [phone] 3 Doses/Fill 0  . aspirin EC 81 MG tablet Take 81 mg by mouth at bedtime.    . cephALEXin (KEFLEX) 250 MG capsule Take 1 capsule (250 mg total) by mouth 2 (two) times daily for 7 days. 14 capsule 0  . Cholecalciferol (VITAMIN D3) 1000 units CAPS Take 1,000 Units by mouth daily.    Marland Kitchen co-enzyme Q-10 50  MG capsule Take 100 mg by mouth every morning.    . cyanocobalamin 500 MCG tablet Take 500 mcg by mouth every morning.    . feeding supplement (ENSURE ENLIVE / ENSURE PLUS) LIQD Take 237 mLs by mouth 3 (three) times daily between meals. 237 mL 12  . isosorbide mononitrate (IMDUR) 30 MG 24 hr tablet Take 30 mg by mouth daily.    Marland Kitchen levothyroxine (SYNTHROID, LEVOTHROID) 50 MCG tablet Take 50 mcg by mouth daily before breakfast.    . meclizine (ANTIVERT) 25 MG tablet Take 25 mg by mouth 2 (two) times daily as needed for  dizziness.    . Multiple Vitamins-Minerals (PRESERVISION AREDS 2 PO) Take 1 tablet by mouth daily.    . nitroGLYCERIN (NITROSTAT) 0.4 MG SL tablet Place 0.4 mg under the tongue every 5 (five) minutes as needed for chest pain.    Marland Kitchen omeprazole (PRILOSEC) 20 MG capsule Take 20 mg by mouth daily.    Marland Kitchen rOPINIRole (REQUIP) 1 MG tablet Take 1 mg by mouth 3 (three) times daily.    . sildenafil (VIAGRA) 50 MG tablet Take 50 mg by mouth as needed.    . simvastatin (ZOCOR) 40 MG tablet TAKE 1 TABLET BY MOUTH EVERY NIGHT AT BEDTIME (Patient taking differently: Take 40 mg by mouth daily at 6 PM.) 30 tablet 5   No current facility-administered medications for this visit.    OBJECTIVE: There were no vitals filed for this visit.   There is no height or weight on file to calculate BMI.    ECOG FS:0 - Asymptomatic  General: Well-developed, well-nourished, no acute distress. Eyes: Pink conjunctiva, anicteric sclera. HEENT: Normocephalic, moist mucous membranes. Lungs: No audible wheezing or coughing. Heart: Regular rate and rhythm. Abdomen: Soft, nontender, no obvious distention. Musculoskeletal: No edema, cyanosis, or clubbing. Neuro: Alert, answering all questions appropriately. Cranial nerves grossly intact. Skin: No rashes or petechiae noted. Psych: Normal affect. Lymphatics: No cervical, calvicular, axillary or inguinal LAD.   LAB RESULTS:  Lab Results  Component Value Date   NA 140 02/09/2021   K 4.1 02/09/2021   CL 105 02/09/2021   CO2 26 02/09/2021   GLUCOSE 118 (H) 02/09/2021   BUN 24 (H) 02/09/2021   CREATININE 0.93 02/09/2021   CALCIUM 8.9 02/09/2021   PROT 6.2 (L) 01/01/2021   ALBUMIN 3.4 (L) 01/01/2021   AST 33 01/01/2021   ALT 19 01/01/2021   ALKPHOS 93 01/01/2021   BILITOT 1.0 01/01/2021   GFRNONAA >60 02/09/2021   GFRAA >60 06/23/2020    Lab Results  Component Value Date   WBC 6.3 02/09/2021   NEUTROABS 4.2 12/31/2020   HGB 11.6 (L) 02/09/2021   HCT 35.2 (L)  02/09/2021   MCV 94.9 02/09/2021   PLT 258 02/09/2021     STUDIES: CT ANGIO HEAD NECK W WO CM  Result Date: 02/09/2021 CLINICAL DATA:  Left neck pain after fall 4 weeks ago. EXAM: CT ANGIOGRAPHY HEAD AND NECK TECHNIQUE: Multidetector CT imaging of the head and neck was performed using the standard protocol during bolus administration of intravenous contrast. Multiplanar CT image reconstructions and MIPs were obtained to evaluate the vascular anatomy. Carotid stenosis measurements (when applicable) are obtained utilizing NASCET criteria, using the distal internal carotid diameter as the denominator. CONTRAST:  65mL OMNIPAQUE IOHEXOL 350 MG/ML SOLN COMPARISON:  CT head 12/31/2020 FINDINGS: CT HEAD FINDINGS Brain: Generalized mild atrophy. Mild patchy white matter hypodensity bilaterally. Negative for acute infarct, hemorrhage, mass. Vascular: Atherosclerotic calcification in the carotid  and vertebral arteries. Negative for hyperdense vessel Skull: Multiple sclerotic skull lesions are present similar to the prior study from metastatic prostate cancer. Correlate with PSA. Sinuses: Negative Orbits: Negative Review of the MIP images confirms the above findings CTA NECK FINDINGS Aortic arch: Atherosclerotic calcification aortic arch and proximal great vessels. Limited evaluation of the arch which was largely excluded from the study. Right carotid system: Mild atherosclerotic calcification right carotid bulb. No significant carotid stenosis or dissection Left carotid system: Mild atherosclerotic disease left carotid bifurcation. Negative for stenosis or dissection. No aneurysm Vertebral arteries: Left vertebral artery dominant. Proximal and mid vertebral arteries are widely patent. There is calcific stenosis of the distal vertebral artery bilaterally. Skeleton: Cervical spondylosis. Multiple sclerotic lesions are seen in the cervical and thoracic spine compatible with widespread metastatic disease from prostate  cancer. Lesions also present in the mandibular condyle bilaterally. Other neck: No soft tissue mass or adenopathy. Upper chest: Lung apices clear bilaterally. Review of the MIP images confirms the above findings CTA HEAD FINDINGS Anterior circulation: Atherosclerotic calcification throughout the cavernous carotid bilaterally with mild stenosis bilaterally. Anterior and middle cerebral arteries patent without stenosis bilaterally. No large vessel occlusion. Atrophy and chronic microvascular ischemic change in the white matter. Posterior circulation: Severe calcific stenosis right V4 segment. Right PICA patent. Moderate stenosis left V4 segment. Left PICA patent. Basilar widely patent. Superior cerebellar and posterior cerebral arteries patent bilaterally without stenosis. Venous sinuses: Normal venous enhancement Anatomic variants: None Review of the MIP images confirms the above findings IMPRESSION: 1. No acute intracranial abnormality 2. Multiple sclerotic skeletal lesions in the skull, mandible, and spine consistent with metastatic prostate cancer. 3. Mild atherosclerotic disease in the carotid artery bilaterally. Negative for carotid stenosis or dissection 4. Severe calcific stenosis right V4 segment and moderate calcific stenosis left V4 segment 5. No intracranial stenosis or large vessel occlusion. Electronically Signed   By: Franchot Gallo M.D.   On: 02/09/2021 12:50   DG Chest 2 View  Result Date: 02/09/2021 CLINICAL DATA:  Chest pain EXAM: CHEST - 2 VIEW COMPARISON:  October 08, 2020 FINDINGS: There are several ill-defined nodular opacities throughout the left lung, largest measuring just over 1 cm. Area of subtle airspace opacity in the lateral right mid lung noted. Ill-defined airspace opacity also noted in left apex. Heart size and pulmonary vascularity are normal. No adenopathy. There is aortic atherosclerosis. There is degenerative change in each shoulder. IMPRESSION: Ill-defined nodular opacities  in the left lung, potentially representing neoplastic foci. Chest CT, ideally with intravenous contrast is felt to be warranted given this appearance. Ill-defined airspace opacity concerning for pneumonia lateral right mid lung. There is also ill-defined opacity in the left apex region, a potential second focus of pneumonia. Heart size normal. No adenopathy. Aortic Atherosclerosis (ICD10-I70.0). Electronically Signed   By: Lowella Grip III M.D.   On: 02/09/2021 11:03   CT Chest W Contrast  Result Date: 02/09/2021 CLINICAL DATA:  Soft tissue mass in the chest, question of nodules seen on recent chest x-ray in this patient with known prostate cancer metastatic disease to bone. EXAM: CT CHEST WITH CONTRAST TECHNIQUE: Multidetector CT imaging of the chest was performed during intravenous contrast administration. CONTRAST:  56mL OMNIPAQUE IOHEXOL 350 MG/ML SOLN COMPARISON:  Prior chest x-rays. No recent CT of the chest for comparison. FINDINGS: Cardiovascular: Calcified noncalcified atheromatous plaque in the thoracic aorta. Normal heart size without pericardial effusion. Three-vessel coronary artery disease. Central pulmonary vasculature normal caliber. Mediastinum/Nodes: No pathologic size lymph nodes  in the chest. 8 mm LEFT paratracheal lymph node and scattered small lymph nodes in the lower mediastinum. Mild fullness of subcarinal nodal tissue. No thoracic inlet lymphadenopathy. No axillary lymphadenopathy. No hilar lymphadenopathy. Lungs/Pleura: Pulmonary emphysema. Granuloma in the RIGHT mid chest anteriorly in the RIGHT upper lobe. Basilar atelectasis. Airways are patent. No sign of pleural effusion. Upper Abdomen: Low-density hepatic lesions appears similar to prior imaging. Liver not well assessed. Dominant low-density the lesion compatible with a cyst seventeen Hounsfield units. Multiple cysts were seen on previous imaging. Low attenuation of hepatic parenchyma is suggested with nodular hepatic  contours. Post cholecystectomy. No acute upper abdominal process Musculoskeletal: Diffuse skeletal metastatic disease, diffuse involvement of the RIGHT proximal humerus partially imaged. Bilateral scapular, clavicular and sternal involvement. Bilateral rib involvement with sclerotic foci at every level in the spine dominant area at the T11 level in the spine measuring approximately 2.8 cm. Lesions are more sclerotic than on previous imaging. The T11 lesion was not present on the prior study. Multiples in new sclerotic foci at the lower portion of the thoracic spine. No acute bone finding. IMPRESSION: 1. Increase in number and size of sclerotic skeletal metastatic disease particularly in the spine, comparison made with August of 2021 in the lower thoracic spine. 2. No signs of pulmonary metastatic disease 3. Three-vessel coronary artery disease. 4. Multiple hepatic lesions shown to represent cyst on previous imaging not well evaluated currently but grossly unchanged on a background of hepatic steatosis. 5. Emphysema and aortic atherosclerosis. Aortic Atherosclerosis (ICD10-I70.0) and Emphysema (ICD10-J43.9). Electronically Signed   By: Zetta Bills M.D.   On: 02/09/2021 13:09    ASSESSMENT: Stage IV prostate cancer.  PLAN:    1.  Stage IV prostate cancer: Patient's most recent PSA reported to 25 which is down significantly from July 2021 where his reported PSA was greater than 2000.  He initiated Eligard on August 05, 2020.  Given his bony mets, he will benefit from Zometa every 6 months as well.  (Prolia is not covered by insurance) return to clinic next week for laboratory work and Zometa.  Patient will then return to clinic in 6 months for further evaluation and continuation of treatment.  Plan to discuss with urology if patient should receive his Eligard concurrently with Zometa. 2.  Urinary retention: Continue in and out cath as per urology.  I spent a total of 60 minutes reviewing chart data,  face-to-face evaluation with the patient, counseling and coordination of care as detailed above.   Patient expressed understanding and was in agreement with this plan. He also understands that He can call clinic at any time with any questions, concerns, or complaints.   Cancer Staging PROSTATE CANCER Staging form: Prostate, AJCC 7th Edition - Clinical: Stage IV (TX, NX, M1b, PSA: 20 or greater) - Signed by Lloyd Huger, MD on 08/15/2020   Lloyd Huger, MD   02/13/2021 7:55 AM

## 2021-02-17 ENCOUNTER — Encounter: Payer: Self-pay | Admitting: Physician Assistant

## 2021-02-17 ENCOUNTER — Other Ambulatory Visit: Payer: Self-pay

## 2021-02-17 ENCOUNTER — Ambulatory Visit: Payer: Medicare PPO | Admitting: Physician Assistant

## 2021-02-17 VITALS — BP 143/71 | HR 67 | Ht 69.0 in | Wt 150.0 lb

## 2021-02-17 DIAGNOSIS — N401 Enlarged prostate with lower urinary tract symptoms: Secondary | ICD-10-CM | POA: Diagnosis not present

## 2021-02-17 DIAGNOSIS — C61 Malignant neoplasm of prostate: Secondary | ICD-10-CM

## 2021-02-17 DIAGNOSIS — R8271 Bacteriuria: Secondary | ICD-10-CM

## 2021-02-17 DIAGNOSIS — R338 Other retention of urine: Secondary | ICD-10-CM

## 2021-02-17 LAB — BLADDER SCAN AMB NON-IMAGING

## 2021-02-17 MED ORDER — LEUPROLIDE ACETATE (6 MONTH) 45 MG ~~LOC~~ KIT
45.0000 mg | PACK | Freq: Once | SUBCUTANEOUS | Status: AC
  Administered 2021-02-17: 45 mg via SUBCUTANEOUS

## 2021-02-17 NOTE — Patient Instructions (Signed)
Obstetrics: Normal and problem pregnancies (7th ed., pp. [phone]). [place], [place]: Elsevier."> [facility] (12th ed., pp. [phone]). [place], [place]: Elsevier.">  Asymptomatic Bacteriuria Asymptomatic bacteriuria is the presence of a large number of bacteria in the urine without the usual symptoms of burning or frequent urination. This is usually not harmful, and treatment may not be needed. A person with this condition will not be more likely to develop an infection in the future. What are the causes? This condition is caused by an increase in bacteria in the urine. This increase can be caused by:  Bacteria entering the urinary tract, such as during sex.  A blockage in the urinary tract, such as from kidney stones or a tumor.  Bladder problems that prevent the bladder from emptying.   What increases the risk? You are more likely to develop this condition if:  You have diabetes.  You are an older adult. This especially affects older adults in long-term care facilities.  You are pregnant and in the first trimester.  You have kidney stones.  You are male.  You have had a kidney transplant.  You have a leaky kidney tube valve (reflux).  You had a urinary catheter for a long period of time. This is a long, thin tube that collects urine. What are the signs or symptoms? There are no symptoms of this condition. How is this diagnosed? This condition is diagnosed with a urine test. Because this condition does not cause symptoms, it is usually diagnosed when a urine sample is taken to treat or diagnose another condition, such as pregnancy or kidney problems. Most women who are in their first trimester of pregnancy are screened for asymptomatic bacteriuria. How is this treated? Usually, treatment is not needed for this condition. Treating the condition can lead to other problems, such as a yeast infection or the growth of bacteria that do not respond to treatment  (antibiotic-resistant bacteria). Some people do need treatment with antibiotic medicines to prevent kidney infection, known as pyelonephritis. Treatment is needed if:  You are pregnant. In pregnant women, kidney infection can lead to: ? Early labor (premature labor). ? Very low birth weight (fetal growth restriction). ? Newborn death.  You are having a procedure that affects the urinary tract.  You have had a kidney transplant. If you are diagnosed with this condition, talk with your health care provider about any concerns that you have. Follow these instructions at home: Medicines  Take over-the-counter and prescription medicines only as told by your health care provider.  If you were prescribed an antibiotic medicine, take it as told by your health care provider. Do not stop using the antibiotic even if you start to feel better. General instructions  Monitor your condition for any changes.  Drink enough fluid to keep your urine pale yellow.  Urinate more often to keep your bladder empty.  If you are male, keep the area around your vagina and rectum clean. Wipe from front to back after urinating or having a bowel movement. Use each piece of toilet paper only once.  Keep all follow-up visits. This is important. Contact a health care provider if:  You have symptoms of a urine infection, such as: ? A burning sensation, or pain when you urinate. ? A strong need to urinate, or urinating more often. ? Urine turning discolored or cloudy. ? Blood in your urine. ? Urine that smells bad. Get help right away if:  You develop signs of a kidney infection, such as: ? Back  pain or pelvic pain. ? A fever or chills. ? Nausea or vomiting. ? Severe pain that cannot be controlled with medicine. Summary  Asymptomatic bacteriuria is the presence of a large number of bacteria in the urine without the usual symptoms of burning or frequent urination.  Usually, treatment is not needed for  this condition. Treating the condition can lead to other problems, such as a yeast infection or the growth of bacteria that do not respond to treatment.  Some people do need treatment. Treatment is needed if you are pregnant, if you are having a procedure that affects the urinary tract, or if you have had a kidney transplant.  If you were prescribed an antibiotic medicine, take it as told by your health care provider. Do not stop using the antibiotic even if you start to feel better. This information is not intended to replace advice given to you by your health care provider. Make sure you discuss any questions you have with your health care provider. Document Revised: 05/15/2020 Document Reviewed: 05/15/2020 Elsevier Patient Education  2021 [employer] Inc.  

## 2021-02-17 NOTE — Progress Notes (Signed)
Eligard SubQ Injection   Due to Prostate Cancer patient is present today for a Eligard Injection.  Medication: Eligard 6 month Dose: 45 mg  Location: right ventrogluteal Lot: [id number] Exp: 07/16/2022  Patient tolerated well, no complications were noted  Performed by: Bradly Bienenstock CMA  Per Sam patient is to continue therapy for indefinitely . Patient's next follow up was scheduled for 08/17/21. This appointment was scheduled using wheel and given to patient today along with reminder continue on Vitamin D 800-1000iu and Calium 1000-1200mg  daily while on Androgen Deprivation Therapy.  [place] approval dates: NO PA required

## 2021-02-17 NOTE — Progress Notes (Signed)
02/17/2021 10:30 AM   Jason Murphy Aug 21, 1920 [id number]  CC: Chief Complaint  Patient presents with  . Follow-up    HPI: Jason Murphy is a 87 y.o. male with advanced metastatic prostate cancer on ADT and urinary retention managed with CIC who presents today for ED follow-up. He is accompanied today by his daughter, who contributes to HPI.  He was seen in the emergency department 8 days ago with reports of an isolated episode of dizziness associated with chest pain as well as neck pain occurring after a fall 1 month prior. He specifically denied any urinary symptoms, abdominal pain, fever, or chills.  For some reason, a UA was collected which was notable for nitrites, pyuria, and microscopic hematuria.  He was diagnosed with UTI and started on empiric Keflex.  Urine culture ultimately grew multiple species.  Today he reports being diagnosed and treated for multiple UTIs this year.  The only symptoms he has had with these are increased confusion and weakness.  Patient reports no improvement in these on empiric Keflex this week.  He has dizziness at baseline which has been particularly bothersome for him in the past week.  He is only self cathing occasionally at night and is spontaneously voiding.  He feels he empties his bladder well.  He is circumcised. PVR 74mL.  Notably, he is overdue for 47-month Eligard injection, last administered in clinic on 08/05/2020.  He no-showed his scheduled visit for this last month.  PMH: Past Medical History:  Diagnosis Date  . Cancer [facility])    prostate  . Coronary artery disease   . Dizziness    chronic dizziness  . Dyspnea   . Fatigue    Progressive fatigue with question of sleep apnea  . Hypercholesterolemia   . Hypertension   . Obstructive sleep apnea   . Pneumonia   . Restless leg syndrome     Surgical History: Past Surgical History:  Procedure Laterality Date  . APPENDECTOMY    . CARDIAC CATHETERIZATION     Ejection Fraction was  60%, stent  . KNEE SURGERY     arthroscopic left knee surgery  . SHOULDER ARTHROSCOPY      Home Medications:  Allergies as of 02/17/2021   No Known Allergies     Medication List       Accurate as of Feb 17, 2021 10:30 AM. If you have any questions, ask your nurse or doctor.        AMBULATORY NON FORMULARY MEDICATION Trimix (30/1/10)-(Pap/Phent/PGE)  Test Dose  69ml vial   Qty #3 Refills 0  [facility] [phone] Fax [phone]   aspirin EC 81 MG tablet Take 81 mg by mouth at bedtime.   co-enzyme Q-10 50 MG capsule Take 100 mg by mouth every morning.   feeding supplement Liqd Take 237 mLs by mouth 3 (three) times daily between meals.   isosorbide mononitrate 30 MG 24 hr tablet Commonly known as: IMDUR Take 30 mg by mouth daily.   levothyroxine 50 MCG tablet Commonly known as: SYNTHROID Take 50 mcg by mouth daily before breakfast.   meclizine 25 MG tablet Commonly known as: ANTIVERT Take 25 mg by mouth 2 (two) times daily as needed for dizziness.   nitroGLYCERIN 0.4 MG SL tablet Commonly known as: NITROSTAT Place 0.4 mg under the tongue every 5 (five) minutes as needed for chest pain.   omeprazole 20 MG capsule Commonly known as: PRILOSEC Take 20 mg by mouth daily.   PRESERVISION AREDS  2 PO Take 1 tablet by mouth daily.   rOPINIRole 1 MG tablet Commonly known as: REQUIP Take 1 mg by mouth 3 (three) times daily.   sildenafil 50 MG tablet Commonly known as: VIAGRA Take 50 mg by mouth as needed.   simvastatin 40 MG tablet Commonly known as: ZOCOR TAKE 1 TABLET BY MOUTH EVERY NIGHT AT BEDTIME What changed: when to take this   vitamin B-12 500 MCG tablet Commonly known as: CYANOCOBALAMIN Take 500 mcg by mouth every morning.   Vitamin D3 25 MCG (1000 UT) Caps Take 1,000 Units by mouth daily.       Allergies:  No Known Allergies  Family History: Family History  Problem Relation Age of Onset  . COPD Mother     Social History:    reports that he has never smoked. He has never used smokeless tobacco. He reports current alcohol use. He reports that he does not use drugs.  Physical Exam: BP (!) 143/71   Pulse 67   Ht 5\' 9"  (1.753 m)   Wt 150 lb (68 kg)   BMI 22.15 kg/m   Constitutional:  Alert and oriented, no acute distress, nontoxic appearing HEENT: [place], AT Cardiovascular: No clubbing, cyanosis, or edema Respiratory: Normal respiratory effort, no increased work of breathing Skin: No rashes, bruises or suspicious lesions Neurologic: Grossly intact, no focal deficits, moving all 4 extremities Psychiatric: Normal mood and affect  Laboratory Data: Results for orders placed or performed in visit on 02/17/21  BLADDER SCAN AMB NON-IMAGING  Result Value Ref Range   Scan Result 64mL    Assessment & Plan:   1. Asymptomatic bacteriuria Patient denies infective symptoms including dysuria, urgency, frequency, lower abdominal pain, low back pain, flank pain, fever, chills, nausea, or vomiting associated with his most recent or other UTI diagnoses.  We discussed the difference between urinary infection and asymptomatic bacteriuria and I explained that I do not recommend antibiotic treatment in the absence of infective symptoms, as he is likely colonized with bacteria and antibiotic therapy is not indicated for this.  Patient expressed understanding.  2. PROSTATE CANCER Overdue for 39-month Eligard and PSA today, PSA drawn in clinic and Eligard administered. - PSA - leuprolide (6 Month) (ELIGARD) injection 45 mg  3. Benign prostatic hyperplasia with urinary retention PVR WNL today.  Okay to continue with rare CIC as needed. - BLADDER SCAN AMB NON-IMAGING   Return in about 6 months (around 08/20/2021) for 44mos Eligard + PSA.  [name], PA-C  [facility] [address], [place], [place] [address] [phone]

## 2021-02-18 ENCOUNTER — Inpatient Hospital Stay: Payer: Medicare PPO | Attending: Oncology

## 2021-02-18 ENCOUNTER — Telehealth: Payer: Self-pay | Admitting: Oncology

## 2021-02-18 ENCOUNTER — Telehealth: Payer: Self-pay

## 2021-02-18 ENCOUNTER — Inpatient Hospital Stay: Payer: Medicare PPO

## 2021-02-18 ENCOUNTER — Ambulatory Visit: Payer: Medicare HMO

## 2021-02-18 ENCOUNTER — Inpatient Hospital Stay: Payer: Medicare PPO | Admitting: Hospice and Palliative Medicine

## 2021-02-18 ENCOUNTER — Inpatient Hospital Stay: Payer: Medicare PPO | Admitting: Oncology

## 2021-02-18 DIAGNOSIS — C61 Malignant neoplasm of prostate: Secondary | ICD-10-CM

## 2021-02-18 LAB — PSA: Prostate Specific Ag, Serum: 47.1 ng/mL — ABNORMAL HIGH (ref 0.0–4.0)

## 2021-02-18 NOTE — Telephone Encounter (Signed)
OK per DPR, LMOM of daughter PSA results.

## 2021-02-18 NOTE — Telephone Encounter (Signed)
Left VM with patient's daughter to please call back with updated contact info for patient (he lives in facility: [facility] and per Network engineer he handles his own appts) I tried to contact the patient with # provided by facility and it is not in service [phone].

## 2021-02-18 NOTE — Telephone Encounter (Signed)
-----   Message from [name], [place] sent at 02/18/2021  8:24 AM EDT ----- PSA downtrending in the setting of ADT, good news. Continue with 6 mos Eligard schedule. ----- Message ----- From: Interface, [facility] Lab Results In Sent: 02/18/2021   5:38 AM EDT To: [name], PA-C

## 2021-08-12 ENCOUNTER — Other Ambulatory Visit: Payer: Medicare PPO

## 2021-08-12 ENCOUNTER — Other Ambulatory Visit: Payer: Self-pay

## 2021-08-12 DIAGNOSIS — C61 Malignant neoplasm of prostate: Secondary | ICD-10-CM

## 2021-08-16 NOTE — Progress Notes (Incomplete)
08/16/21 12:05 PM   [name] 06-03-1920 [id number]  Referring provider:  Baxter Hire, MD [place],  [place] [address] No chief complaint on file.    HPI: Jason Murphy is a 87 y.o.male with a personal history of prostate cancer, urinary retention, who presents today for 6 month follow-up on Eligard, and PSA.   Whole body scan on 06/03/2020 showed diffuse bone metastases throughout the axial and proximal appendicular skeleton.   His urinary retention is managed on CIC. His prostate cancer is managed on Eligard. His last Eligard injection was on 02/17/2021 with [name], PA-C.  Previous PSA on 02/17/2021 was 47.1.     PSA trend:  Component Prostate Specific Ag, Serum  Latest Ref Rng & Units 0.0 - 4.0 ng/mL  04/29/2020 2,355.0 (H)  08/03/2020 265.0 (H)  02/17/2021 47.1 (H)     PMH: Past Medical History:  Diagnosis Date   Cancer ([place])    prostate   Coronary artery disease    Dizziness    chronic dizziness   Dyspnea    Fatigue    Progressive fatigue with question of sleep apnea   Hypercholesterolemia    Hypertension    Obstructive sleep apnea    Pneumonia    Restless leg syndrome     Surgical History: Past Surgical History:  Procedure Laterality Date   APPENDECTOMY     CARDIAC CATHETERIZATION     Ejection Fraction was 60%, stent   KNEE SURGERY     arthroscopic left knee surgery   SHOULDER ARTHROSCOPY      Home Medications:  Allergies as of 08/17/2021   No Known Allergies      Medication List        Accurate as of August 16, 2021 12:05 PM. If you have any questions, ask your nurse or doctor.          AMBULATORY NON FORMULARY MEDICATION Trimix (30/1/10)-(Pap/Phent/PGE)  Test Dose  22ml vial   Qty #3 Refills 0  [facility] [phone] Fax [phone]   aspirin EC 81 MG tablet Take 81 mg by mouth at bedtime.   co-enzyme Q-10 50 MG capsule Take 100 mg by mouth every morning.   feeding  supplement Liqd Take 237 mLs by mouth 3 (three) times daily between meals.   isosorbide mononitrate 30 MG 24 hr tablet Commonly known as: IMDUR Take 30 mg by mouth daily.   levothyroxine 50 MCG tablet Commonly known as: SYNTHROID Take 50 mcg by mouth daily before breakfast.   meclizine 25 MG tablet Commonly known as: ANTIVERT Take 25 mg by mouth 2 (two) times daily as needed for dizziness.   nitroGLYCERIN 0.4 MG SL tablet Commonly known as: NITROSTAT Place 0.4 mg under the tongue every 5 (five) minutes as needed for chest pain.   omeprazole 20 MG capsule Commonly known as: PRILOSEC Take 20 mg by mouth daily.   PRESERVISION AREDS 2 PO Take 1 tablet by mouth daily.   rOPINIRole 1 MG tablet Commonly known as: REQUIP Take 1 mg by mouth 3 (three) times daily.   sildenafil 50 MG tablet Commonly known as: VIAGRA Take 50 mg by mouth as needed.   simvastatin 40 MG tablet Commonly known as: ZOCOR TAKE 1 TABLET BY MOUTH EVERY NIGHT AT BEDTIME What changed: when to take this   vitamin B-12 500 MCG tablet Commonly known as: CYANOCOBALAMIN Take 500 mcg by mouth every morning.   Vitamin D3 25 MCG (1000 UT) Caps Take  1,000 Units by mouth daily.        Allergies: No Known Allergies  Family History: Family History  Problem Relation Age of Onset   COPD Mother     Social History:  reports that he has never smoked. He has never used smokeless tobacco. He reports current alcohol use. He reports that he does not use drugs.   Physical Exam: There were no vitals taken for this visit.  Constitutional:  Alert and oriented, No acute distress. HEENT: [place] AT, moist mucus membranes.  Trachea midline, no masses. Cardiovascular: No clubbing, cyanosis, or edema. Respiratory: Normal respiratory effort, no increased work of breathing. Skin: No rashes, bruises or suspicious lesions. Neurologic: Grossly intact, no focal deficits, moving all 4 extremities. Psychiatric: Normal mood and  affect.  Laboratory Data:  Lab Results  Component Value Date   CREATININE 0.93 02/09/2021     Lab Results  Component Value Date   TESTOSTERONE 6 (L) 08/03/2020     Urinalysis   Pertinent Imaging:  Assessment & Plan:   Prostate cancer  No follow-ups on file.  I,Kailey Littlejohn,acting as a Education administrator for Hollice Espy, MD.,have documented all relevant documentation on the behalf of Hollice Espy, MD,as directed by  Hollice Espy, MD while in the presence of Hollice Espy, [place] [address] [place], [place] [address] [phone]

## 2021-08-17 ENCOUNTER — Encounter: Payer: Self-pay | Admitting: Urology

## 2021-08-17 ENCOUNTER — Ambulatory Visit: Payer: Self-pay | Admitting: Urology

## 2021-08-18 ENCOUNTER — Telehealth: Payer: Self-pay | Admitting: Urology

## 2021-08-18 NOTE — Telephone Encounter (Signed)
I called the facility personally at which Mr. Jason Murphy is residing.  He confirmed that he in fact is a resident there and generally he does continue to drive himself to appointments, etc.  Left a message with attendant to have Mr. Jason Murphy.  Expressed my concern about him missing an appointment yesterday.  He will pass along the message and will have him return our call to reschedule.    Hollice Espy, MD

## 2021-08-20 NOTE — Telephone Encounter (Signed)
Spoke with [name], step-daughter. Will ensure he keeps appointment.

## 2021-08-24 NOTE — Progress Notes (Signed)
08/25/21 2:41 PM   Jason Murphy [birth year] [id number]  Referring provider:  Baxter Hire, MD [place],  [place] [address] Chief Complaint  Patient presents with   Prostate Cancer     HPI: Jason Murphy is a 87 y.o.male with a personal history of advanced metastatic prostate cancer on ADT and urinary retention managed with CIC, who presents today for 6 month follow-up with PSA and Eligard.   His last eligard injection was on 02/17/2021.  PSA was drawn today and is still pending.  He is doing well today he reports that he has been feeling that he is not emptying well or adequately. He has been experiencing nocturia x4-5. He feels as if he has an infection.  He has been self cathing at times to see if emptying his bladder helps and this not helped much.  He denies any fevers or chills.  He has missed a few appointments and was finally able to come today.  He also never followed up with medical oncology for Prolia.  Component     Latest Ref Rng & Units 04/29/2020 08/03/2020 02/17/2021  Prostate Specific Ag, Serum     0.0 - 4.0 ng/mL 2,355.0 (H) 265.0 (H) 47.1 (H)     PMH: Past Medical History:  Diagnosis Date   Cancer ([place])    prostate   Coronary artery disease    Dizziness    chronic dizziness   Dyspnea    Fatigue    Progressive fatigue with question of sleep apnea   Hypercholesterolemia    Hypertension    Obstructive sleep apnea    Pneumonia    Restless leg syndrome     Surgical History: Past Surgical History:  Procedure Laterality Date   APPENDECTOMY     CARDIAC CATHETERIZATION     Ejection Fraction was 60%, stent   KNEE SURGERY     arthroscopic left knee surgery   SHOULDER ARTHROSCOPY      Home Medications:  Allergies as of 08/25/2021   No Known Allergies      Medication List        Accurate as of August 25, 2021  2:41 PM. If you have any questions, ask your nurse or doctor.          AMBULATORY NON FORMULARY  MEDICATION Trimix (30/1/10)-(Pap/Phent/PGE)  Test Dose  43ml vial   Qty #3 Refills 0  [facility] [phone] Fax [phone]   aspirin EC 81 MG tablet Take 81 mg by mouth at bedtime.   co-enzyme Q-10 50 MG capsule Take 100 mg by mouth every morning.   feeding supplement Liqd Take 237 mLs by mouth 3 (three) times daily between meals.   finasteride 5 MG tablet Commonly known as: PROSCAR Take 5 mg by mouth daily.   isosorbide mononitrate 30 MG 24 hr tablet Commonly known as: IMDUR Take 30 mg by mouth daily.   levothyroxine 50 MCG tablet Commonly known as: SYNTHROID Take 50 mcg by mouth daily before breakfast.   meclizine 25 MG tablet Commonly known as: ANTIVERT Take 25 mg by mouth 2 (two) times daily as needed for dizziness.   nitroGLYCERIN 0.4 MG SL tablet Commonly known as: NITROSTAT Place 0.4 mg under the tongue every 5 (five) minutes as needed for chest pain.   omeprazole 20 MG capsule Commonly known as: PRILOSEC Take 20 mg by mouth daily.   PRESERVISION AREDS 2 PO Take 1 tablet by mouth daily.   rOPINIRole 1 MG tablet  Commonly known as: REQUIP Take 1 mg by mouth 3 (three) times daily.   sildenafil 50 MG tablet Commonly known as: VIAGRA Take 50 mg by mouth as needed.   simvastatin 40 MG tablet Commonly known as: ZOCOR TAKE 1 TABLET BY MOUTH EVERY NIGHT AT BEDTIME What changed: when to take this   sulfamethoxazole-trimethoprim 800-160 MG tablet Commonly known as: BACTRIM DS Take 1 tablet by mouth every 12 (twelve) hours for 7 days. Started by: Hollice Espy, MD   vitamin B-12 500 MCG tablet Commonly known as: CYANOCOBALAMIN Take 500 mcg by mouth every morning.   Vitamin D3 25 MCG (1000 UT) Caps Take 1,000 Units by mouth daily.        Allergies: No Known Allergies  Family History: Family History  Problem Relation Age of Onset   COPD Mother     Social History:  reports that he has never smoked. He has never used smokeless  tobacco. He reports current alcohol use. He reports that he does not use drugs.   Physical Exam: BP 114/74   Pulse 73   Ht 5\' 9"  (1.753 m)   Wt 154 lb (69.9 kg)   BMI 22.74 kg/m   Constitutional:  Alert and oriented, No acute distress. HEENT: [place] AT, moist mucus membranes.  Trachea midline, no masses. Cardiovascular: No clubbing, cyanosis, or edema. Respiratory: Normal respiratory effort, no increased work of breathing. Skin: No rashes, bruises or suspicious lesions. Neurologic: Grossly intact, no focal deficits, moving all 4 extremities. Psychiatric: Normal mood and affect.  Laboratory Data:  Lab Results  Component Value Date   CREATININE 0.93 02/09/2021   Lab Results  Component Value Date   TESTOSTERONE 6 (L) 08/03/2020   Urinalysis - 11-30 WBCs, nitrite positive, and many bacteria    Pertinent Imaging: Results for orders placed or performed in visit on 08/25/21  BLADDER SCAN AMB NON-IMAGING  Result Value Ref Range   Scan Result 0 ml     Assessment & Plan:  Nocturia    - Bothersome symptoms  - Trial Gemtesa given today x 1 month -Bladder emptying which is reassuring, no self cath if he has any trouble emptying his bladder and stop this medication  2. Acute cystitis  - Urine grossly positive with 11-30 WBCs, nitrite positive, and many bacteria  - Prescribed Bactrim DS bid x 7 days  3. Prostate cancer  - PSA;pending  -PSA; future  - Leuprolide (6 Month) (ELIGARD) injection 65 mgoday  - He never followed up with medical oncology for Zom[place] as recommended. Recommend he does this.   F/u 6 months for PSA/ Eligard  I,Kailey Littlejohn,acting as a scribe for Hollice Espy, MD.,have documented all relevant documentation on the behalf of Hollice Espy, MD,as directed by  Hollice Espy, MD while in the presence of Hollice Espy, MD.  I have reviewed the above documentation for accuracy and completeness, and I agree with the above.   Hollice Espy,  MD   [facility] [address], [place], [place] [address] [phone]

## 2021-08-25 ENCOUNTER — Ambulatory Visit: Payer: Medicare PPO | Admitting: Urology

## 2021-08-25 ENCOUNTER — Other Ambulatory Visit: Payer: Self-pay

## 2021-08-25 VITALS — BP 114/74 | HR 73 | Ht 69.0 in | Wt 154.0 lb

## 2021-08-25 DIAGNOSIS — R3915 Urgency of urination: Secondary | ICD-10-CM | POA: Diagnosis not present

## 2021-08-25 DIAGNOSIS — C61 Malignant neoplasm of prostate: Secondary | ICD-10-CM

## 2021-08-25 LAB — BLADDER SCAN AMB NON-IMAGING: Scan Result: 0

## 2021-08-25 MED ORDER — LEUPROLIDE ACETATE (6 MONTH) 45 MG ~~LOC~~ KIT
45.0000 mg | PACK | Freq: Once | SUBCUTANEOUS | Status: AC
  Administered 2021-08-25: 45 mg via SUBCUTANEOUS

## 2021-08-25 MED ORDER — SULFAMETHOXAZOLE-TRIMETHOPRIM 800-160 MG PO TABS: 1.0000 | ORAL_TABLET | Freq: Two times a day (BID) | ORAL | 0 refills | Status: AC

## 2021-08-25 MED ORDER — ELIGARD 45 MG ~~LOC~~ KIT: 45.0000 mg | PACK | SUBCUTANEOUS | 1 refills | Status: DC

## 2021-08-25 NOTE — Progress Notes (Signed)
Eligard SubQ Injection   Due to Prostate Cancer patient is present today for a Eligard Injection.  Medication: Eligard 6 month Dose: 45 mg  Location: right  Lot: [id number] Exp: 12/2022  Patient tolerated well, no complications were noted  Performed by: Kerman Passey, RMA  Per Dr. Erlene Quan patient is to continue therapy for 6 months . Patient's next follow up was scheduled for 6 months. This appointment was scheduled using wheel and given to patient today along with reminder continue on Vitamin D 800-1000iu and Calium 1000-1200mg  daily while on Androgen Deprivation Therapy.  PA approval dates:

## 2021-08-26 ENCOUNTER — Telehealth: Payer: Self-pay

## 2021-08-26 ENCOUNTER — Telehealth: Payer: Self-pay | Admitting: Oncology

## 2021-08-26 LAB — URINALYSIS, COMPLETE
Bilirubin, UA: NEGATIVE
Glucose, UA: NEGATIVE
Ketones, UA: NEGATIVE
Nitrite, UA: POSITIVE — AB
RBC, UA: NEGATIVE
Specific Gravity, UA: 1.02 (ref 1.005–1.030)
Urobilinogen, Ur: 0.2 mg/dL (ref 0.2–1.0)
pH, UA: 6 (ref 5.0–7.5)

## 2021-08-26 LAB — MICROSCOPIC EXAMINATION: Epithelial Cells (non renal): NONE SEEN /hpf (ref 0–10)

## 2021-08-26 LAB — PSA: Prostate Specific Ag, Serum: 457 ng/mL — ABNORMAL HIGH (ref 0.0–4.0)

## 2021-08-26 NOTE — Telephone Encounter (Signed)
Sw someone at his independent living facility and states they will have him return my call. I tried to reach his daughter Colletta [place], no answer and voicemail is full.

## 2021-08-26 NOTE — Telephone Encounter (Signed)
-----   Message from Hollice Espy, MD sent at 08/26/2021  1:24 PM EST ----- Unfortunately, it looks that the PSA is going up again which is highly concerning.  I would like him to come and have his PSA and testosterone rechecked to ensure that he is reaching castrate levels.  If he is, we need to add an oral medicine to his regimen such as apalutamide, Xtandi, etc. 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  Recheck labs and then we will add another oral agent if appropriate  I would also like to make sure that he is following up with medical oncology, please help facilitate this  Hollice Espy, MD

## 2021-08-26 NOTE — Telephone Encounter (Signed)
Caregiver calling to make a follow up appt. For pt. Please call back at [phone]  or [phone]

## 2021-08-26 NOTE — Telephone Encounter (Addendum)
Sw pt scheduled labs. Pt states he started taking antibiotic. I will call Oncology to schedule appointment. I called oncology. Oncology office states they will call pt to schedule appointment. Unable to schedule for patient

## 2021-08-27 LAB — CULTURE, URINE COMPREHENSIVE

## 2021-09-01 ENCOUNTER — Other Ambulatory Visit: Payer: Self-pay | Admitting: *Deleted

## 2021-09-01 ENCOUNTER — Other Ambulatory Visit: Payer: Self-pay

## 2021-09-01 DIAGNOSIS — C61 Malignant neoplasm of prostate: Secondary | ICD-10-CM

## 2021-09-02 ENCOUNTER — Encounter: Payer: Self-pay | Admitting: Urology

## 2021-09-02 ENCOUNTER — Other Ambulatory Visit: Payer: Medicare PPO

## 2021-09-07 NOTE — Progress Notes (Signed)
09/08/21 3:23 PM   [name] 08-26-1920 [id number]  Referring provider:  Baxter Hire, MD [place],   [id number] Chief Complaint  Patient presents with   Prostate Cancer     HPI: Jason Murphy is a 87 y.o.male with a personal history of  advanced metastatic prostate cancer on ADT and history of urinary retention previously managed with [facility], who presents today for follow-up with PSA and testosterone.   His last Eligard injection was in office on 08/25/2021. 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    He is at baseline today, no complaints.  He drove himself to the office today.    He has missed numerous appts.  He is also no longer following at the cancer center for bone strengthening medications.    PSA trend:  Component Prostate Specific Ag, Serum  Latest Ref Rng & Units 0.0 - 4.0 ng/mL  04/29/2020 2,355.0 (H)  08/03/2020 265.0 (H)  02/17/2021 47.1 (H)  08/25/2021 457.0 (H)     PMH: Past Medical History:  Diagnosis Date   Cancer ([place])    prostate   Coronary artery disease    Dizziness    chronic dizziness   Dyspnea    Fatigue    Progressive fatigue with question of sleep apnea   Hypercholesterolemia    Hypertension    Obstructive sleep apnea    Pneumonia    Restless leg syndrome     Surgical History: Past Surgical History:  Procedure Laterality Date   APPENDECTOMY     CARDIAC CATHETERIZATION     Ejection Fraction was 60%, stent   KNEE SURGERY     arthroscopic left knee surgery   SHOULDER ARTHROSCOPY      Home Medications:  Allergies as of 09/08/2021   No Known Allergies      Medication List        Accurate as of September 08, 2021  3:23 PM. If you have any questions, ask your nurse or doctor.          STOP taking these medications    AMBULATORY NON FORMULARY MEDICATION Stopped by: Hollice Espy, MD       TAKE these medications     aspirin EC 81 MG tablet Take 81 mg by mouth at bedtime.   co-enzyme Q-10 50 MG capsule Take 100 mg by mouth every morning.   feeding supplement Liqd Take 237 mLs by mouth 3 (three) times daily between meals.   finasteride 5 MG tablet Commonly known as: PROSCAR Take 5 mg by mouth daily.   isosorbide mononitrate 30 MG 24 hr tablet Commonly known as: IMDUR Take 30 mg by mouth daily.   levothyroxine 50 MCG tablet Commonly known as: SYNTHROID Take 50 mcg by mouth daily before breakfast.   meclizine 25 MG tablet Commonly known as: ANTIVERT Take 25 mg by mouth 2 (two) times daily as needed for dizziness.   nitroGLYCERIN 0.4 MG SL tablet Commonly known as: NITROSTAT Place 0.4 mg under the tongue every 5 (five) minutes as needed for chest pain.   omeprazole 20 MG capsule Commonly known as: PRILOSEC Take 20 mg by mouth daily.   PRESERVISION AREDS 2 PO Take 1 tablet by mouth daily.   rOPINIRole 1 MG tablet Commonly known as: REQUIP Take 1 mg by mouth 3 (three) times daily.   sildenafil 50 MG tablet Commonly known as: VIAGRA Take 50 mg by mouth as needed.  simvastatin 40 MG tablet Commonly known as: ZOCOR TAKE 1 TABLET BY MOUTH EVERY NIGHT AT BEDTIME What changed: when to take this   vitamin B-12 500 MCG tablet Commonly known as: CYANOCOBALAMIN Take 500 mcg by mouth every morning.   Vitamin D3 25 MCG (1000 UT) Caps Take 1,000 Units by mouth daily.        Allergies: No Known Allergies  Family History: Family History  Problem Relation Age of Onset   COPD Mother     Social History:  reports that he has never smoked. He has never used smokeless tobacco. He reports current alcohol use. He reports that he does not use drugs.   Physical Exam: BP 136/74   Pulse 89   Ht 5\' 9"  (1.753 m)   BMI 22.74 kg/m   Constitutional:  Alert and oriented, No acute distress. HEENT: [place] AT, moist mucus membranes.  Trachea midline, no masses. Cardiovascular: No clubbing,  cyanosis, or edema. Respiratory: Normal respiratory effort, no increased work of breathing. Skin: No rashes, bruises or suspicious lesions. Neurologic: Grossly intact, no focal deficits, moving all 4 extremities. Psychiatric: Normal mood and affect.  Laboratory Data:  Lab Results  Component Value Date   CREATININE 0.93 02/09/2021   Lab Results  Component Value Date   TESTOSTERONE 6 (L) 08/03/2020     Assessment & Plan:    Prostate cancer  - currently only on ADT alone with palliative intent, good PSA response until now - PSA risen on most recent blood draw concerning for interval development of castrate resistance - PSA/ testosterone Pending, recheck to ensure he is reaching castrate levels.  -If PSA in in fact rising, will add oral agent such as Thornell Mule -consider referral back to cancer center for bone health [facility] See), high risk for pathologic fracture/ death disucssed today as well  Will call with PSA results - will make med adjustment and referral as deemed appropriate  I,Kailey Littlejohn,acting as a scribe for Hollice Espy, MD.,have documented all relevant documentation on the behalf of Hollice Espy, MD,as directed by  Hollice Espy, MD while in the presence of Hollice Espy, MD.  I have reviewed the above documentation for accuracy and completeness, and I agree with the above.   Hollice Espy, MD   [facility] [address] [place], [place] [address] [phone]

## 2021-09-08 ENCOUNTER — Ambulatory Visit: Payer: Medicare PPO | Admitting: Urology

## 2021-09-08 ENCOUNTER — Encounter: Payer: Self-pay | Admitting: Urology

## 2021-09-08 ENCOUNTER — Other Ambulatory Visit: Payer: Self-pay

## 2021-09-08 DIAGNOSIS — C61 Malignant neoplasm of prostate: Secondary | ICD-10-CM | POA: Diagnosis not present

## 2021-09-13 ENCOUNTER — Telehealth: Payer: Self-pay | Admitting: *Deleted

## 2021-09-13 NOTE — Telephone Encounter (Addendum)
   Called [facility] per Beverely Low will return my call this afternoon-need clarification from direct lab is unsure the reason PSA is still in Preliminary emailed was sent to director   ----- Message from Hollice Espy, MD sent at 09/13/2021 10:00 AM EST ----- Can you call [facility] and see why this PSA isn't back?  Hollice Espy, MD  ----- Message ----- From: Interface, [facility] Lab Results In Sent: 09/09/2021   7:37 AM EST To: Hollice Espy, MD

## 2021-09-14 LAB — TESTOSTERONE: Testosterone: <3 ng/dL — ABNORMAL LOW (ref 264–916)

## 2021-09-14 LAB — PSA: Prostate Specific Ag, Serum: 525 ng/mL — ABNORMAL HIGH (ref 0.0–4.0)

## 2021-09-17 NOTE — Telephone Encounter (Addendum)
PSA finalized

## 2021-09-17 NOTE — Progress Notes (Signed)
[place]  Telephone:([phone]) [phone] Fax:(336) [phone]  ID: Jason Murphy OB: [birth year]  MR#: [id number]  ZES#:[id number]  Patient Care Team: Baxter Hire, MD as PCP - General (Internal Medicine) Lloyd Huger, MD as Consulting Physician (Oncology)  CHIEF COMPLAINT: Stage IV prostate cancer.  INTERVAL HISTORY: Patient last evaluated in clinic in October 2021.  He is referred back for progressive metastatic prostate cancer and reinitiation of Zometa.  He recently received Eligard urology approximately 1 to 2 weeks ago.  Currently feels well and is asymptomatic.  He does not complain of any pain.  He still remains active and drove him self to clinic today.  He has no neurologic complaints.  He denies any recent fevers or illnesses.  He has a good appetite and denies weight loss.  He has no chest pain, shortness of breath, cough, or hemoptysis.  He denies any nausea, vomiting, constipation, or diarrhea.  He has no urinary complaints.  Patient offers no specific complaints today.  REVIEW OF SYSTEMS:   Review of Systems  Constitutional: Negative.  Negative for fever, malaise/fatigue and weight loss.  Respiratory: Negative.  Negative for cough, hemoptysis and shortness of breath.   Cardiovascular: Negative.  Negative for chest pain and leg swelling.  Gastrointestinal: Negative.  Negative for abdominal pain.  Genitourinary: Negative.  Negative for dysuria.  Musculoskeletal: Negative.  Negative for back pain.  Skin: Negative.  Negative for rash.  Neurological: Negative.  Negative for dizziness, focal weakness, weakness and headaches.  Psychiatric/Behavioral: Negative.  The patient is not nervous/anxious.    As per HPI. Otherwise, a complete review of systems is negative.  PAST MEDICAL HISTORY: Past Medical History:  Diagnosis Date   Cancer [facility])    prostate   Coronary artery disease    Dizziness    chronic dizziness   Dyspnea    Fatigue    Progressive  fatigue with question of sleep apnea   Hypercholesterolemia    Hypertension    Obstructive sleep apnea    Pneumonia    Restless leg syndrome     PAST SURGICAL HISTORY: Past Surgical History:  Procedure Laterality Date   APPENDECTOMY     CARDIAC CATHETERIZATION     Ejection Fraction was 60%, stent   KNEE SURGERY     arthroscopic left knee surgery   SHOULDER ARTHROSCOPY      FAMILY HISTORY: Family History  Problem Relation Age of Onset   COPD Mother     ADVANCED DIRECTIVES (Y/N):  N  HEALTH MAINTENANCE: Social History   Tobacco Use   Smoking status: Never   Smokeless tobacco: Never  Vaping Use   Vaping Use: Never used  Substance Use Topics   Alcohol use: Yes    Comment: 1/2 glass wine daily   Drug use: No     Colonoscopy:  PAP:  Bone density:  Lipid panel:  No Known Allergies  Current Outpatient Medications  Medication Sig Dispense Refill   aspirin EC 81 MG tablet Take 81 mg by mouth at bedtime.     Cholecalciferol (VITAMIN D3) 1000 units CAPS Take 1,000 Units by mouth daily.     co-enzyme Q-10 50 MG capsule Take 100 mg by mouth every morning.     cyanocobalamin 500 MCG tablet Take 500 mcg by mouth every morning.     finasteride (PROSCAR) 5 MG tablet Take 5 mg by mouth daily.     isosorbide mononitrate (IMDUR) 30 MG 24 hr tablet Take 30 mg by mouth daily.  levothyroxine (SYNTHROID, LEVOTHROID) 50 MCG tablet Take 50 mcg by mouth daily before breakfast.     Multiple Vitamins-Minerals (PRESERVISION AREDS 2 PO) Take 1 tablet by mouth daily.     omeprazole (PRILOSEC) 20 MG capsule Take 20 mg by mouth daily.     rOPINIRole (REQUIP) 1 MG tablet Take 1 mg by mouth 3 (three) times daily.     simvastatin (ZOCOR) 40 MG tablet TAKE 1 TABLET BY MOUTH EVERY NIGHT AT BEDTIME (Patient taking differently: Take 40 mg by mouth daily at 6 PM.) 30 tablet 5   feeding supplement (ENSURE ENLIVE / ENSURE PLUS) LIQD Take 237 mLs by mouth 3 (three) times daily between meals.  (Patient not taking: Reported on 09/21/2021) 237 mL 12   meclizine (ANTIVERT) 25 MG tablet Take 25 mg by mouth 2 (two) times daily as needed for dizziness. (Patient not taking: Reported on 09/21/2021)     nitroGLYCERIN (NITROSTAT) 0.4 MG SL tablet Place 0.4 mg under the tongue every 5 (five) minutes as needed for chest pain. (Patient not taking: Reported on 09/21/2021)     No current facility-administered medications for this visit.    OBJECTIVE: Vitals:   09/21/21 1049  BP: 131/60  Pulse: 61  Resp: 18  SpO2: 97%     Body mass index is 23.78 kg/m.    ECOG FS:0 - Asymptomatic  General: Well-developed, well-nourished, no acute distress. Eyes: Pink conjunctiva, anicteric sclera. HEENT: Normocephalic, moist mucous membranes. Lungs: No audible wheezing or coughing. Heart: Regular rate and rhythm. Abdomen: Soft, nontender, no obvious distention. Musculoskeletal: No edema, cyanosis, or clubbing. Neuro: Alert, answering all questions appropriately. Cranial nerves grossly intact. Skin: No rashes or petechiae noted. Psych: Normal affect.  LAB RESULTS:  Lab Results  Component Value Date   NA 140 02/09/2021   K 4.1 02/09/2021   CL 105 02/09/2021   CO2 26 02/09/2021   GLUCOSE 118 (H) 02/09/2021   BUN 24 (H) 02/09/2021   CREATININE 0.93 02/09/2021   CALCIUM 8.9 02/09/2021   PROT 6.2 (L) 01/01/2021   ALBUMIN 3.4 (L) 01/01/2021   AST 33 01/01/2021   ALT 19 01/01/2021   ALKPHOS 93 01/01/2021   BILITOT 1.0 01/01/2021   GFRNONAA >60 02/09/2021   GFRAA >60 06/23/2020    Lab Results  Component Value Date   WBC 6.3 02/09/2021   NEUTROABS 4.2 12/31/2020   HGB 11.6 (L) 02/09/2021   HCT 35.2 (L) 02/09/2021   MCV 94.9 02/09/2021   PLT 258 02/09/2021     STUDIES: No results found.  ASSESSMENT: Stage IV prostate cancer.  PLAN:    1.  Stage IV prostate cancer: Upon diagnosis in July 2021, patient's PSA was reported greater than 2000.  It has trended up from a low of 47.1 in May  2022, the patient has not received any treatment since at least that time.  He last received Eligard at urology on August 25, 2021.  Patient also benefit from Zometa which he last received on August 20, 2020.  He will return to clinic next week to reinitiate Zometa.  Patient will then return to clinic in 3 months for further evaluation and continuation of treatment.  Continue follow-up with urology as scheduled.    I spent a total of 30 minutes reviewing chart data, face-to-face evaluation with the patient, counseling and coordination of care as detailed above.    Patient expressed understanding and was in agreement with this plan. He also understands that He can call clinic at any time  with any questions, concerns, or complaints.    Cancer Staging  PROSTATE CANCER Staging form: Prostate, AJCC 7th Edition - Clinical: Stage IV (TX, NX, M1b, PSA: 20 or greater) - Signed by Lloyd Huger, MD on 08/15/2020   Lloyd Huger, MD   09/21/2021 5:26 PM

## 2021-09-21 ENCOUNTER — Inpatient Hospital Stay: Payer: Medicare PPO | Attending: Oncology | Admitting: Oncology

## 2021-09-21 ENCOUNTER — Other Ambulatory Visit: Payer: Self-pay

## 2021-09-21 VITALS — BP 131/60 | HR 61 | Resp 18 | Wt 161.0 lb

## 2021-09-21 DIAGNOSIS — C7951 Secondary malignant neoplasm of bone: Secondary | ICD-10-CM | POA: Diagnosis present

## 2021-09-21 DIAGNOSIS — C61 Malignant neoplasm of prostate: Secondary | ICD-10-CM | POA: Insufficient documentation

## 2021-09-21 NOTE — Progress Notes (Signed)
Patient reports trouble sleeping, low appetite, urinary frequency and trouble emptying bladder

## 2021-09-23 ENCOUNTER — Other Ambulatory Visit: Payer: Self-pay | Admitting: *Deleted

## 2021-09-23 ENCOUNTER — Telehealth: Payer: Self-pay | Admitting: Urology

## 2021-09-23 DIAGNOSIS — C61 Malignant neoplasm of prostate: Secondary | ICD-10-CM

## 2021-09-23 NOTE — Telephone Encounter (Signed)
Interval development of castrate resistant metastatic prostate cancer.  At this point time, as per discussion in the office, with like to add Xtandi to his Eligard.  Please arrange.  Hollice Espy, MD

## 2021-09-28 ENCOUNTER — Inpatient Hospital Stay: Payer: Medicare PPO

## 2021-09-28 ENCOUNTER — Other Ambulatory Visit: Payer: Self-pay

## 2021-09-28 VITALS — BP 146/60 | HR 56 | Temp 96.4°F | Resp 20

## 2021-09-28 DIAGNOSIS — C61 Malignant neoplasm of prostate: Secondary | ICD-10-CM

## 2021-09-28 DIAGNOSIS — C7951 Secondary malignant neoplasm of bone: Secondary | ICD-10-CM | POA: Diagnosis not present

## 2021-09-28 LAB — COMPREHENSIVE METABOLIC PANEL
ALT: 12 U/L (ref 0–44)
AST: 25 U/L (ref 15–41)
Albumin: 3.8 g/dL (ref 3.5–5.0)
Alkaline Phosphatase: 77 U/L (ref 38–126)
Anion gap: 12 (ref 5–15)
BUN: 26 mg/dL — ABNORMAL HIGH (ref 8–23)
CO2: 23 mmol/L (ref 22–32)
Calcium: 9 mg/dL (ref 8.9–10.3)
Chloride: 103 mmol/L (ref 98–111)
Creatinine, Ser: 0.93 mg/dL (ref 0.61–1.24)
GFR, Estimated: >60 mL/min (ref >60)
Glucose, Bld: 112 mg/dL — ABNORMAL HIGH (ref 70–99)
Potassium: 3.7 mmol/L (ref 3.5–5.1)
Sodium: 138 mmol/L (ref 135–145)
Total Bilirubin: 0.6 mg/dL (ref 0.3–1.2)
Total Protein: 6.9 g/dL (ref 6.5–8.1)

## 2021-09-28 LAB — PSA: Prostatic Specific Antigen: 600 ng/mL — ABNORMAL HIGH (ref 0.00–4.00)

## 2021-09-28 LAB — CBC WITH DIFFERENTIAL/PLATELET
Abs Immature Granulocytes: 0.01 10*3/uL (ref 0.00–0.07)
Basophils Absolute: 0 10*3/uL (ref 0.0–0.1)
Basophils Relative: 1 %
Eosinophils Absolute: 0.1 10*3/uL (ref 0.0–0.5)
Eosinophils Relative: 2 %
HCT: 34.9 % — ABNORMAL LOW (ref 39.0–52.0)
Hemoglobin: 11.7 g/dL — ABNORMAL LOW (ref 13.0–17.0)
Immature Granulocytes: 0 %
Lymphocytes Relative: 37 %
Lymphs Abs: 2 10*3/uL (ref 0.7–4.0)
MCH: 32.7 pg (ref 26.0–34.0)
MCHC: 33.5 g/dL (ref 30.0–36.0)
MCV: 97.5 fL (ref 80.0–100.0)
Monocytes Absolute: 0.6 10*3/uL (ref 0.1–1.0)
Monocytes Relative: 11 %
Neutro Abs: 2.6 10*3/uL (ref 1.7–7.7)
Neutrophils Relative %: 49 %
Platelets: 244 10*3/uL (ref 150–400)
RBC: 3.58 MIL/uL — ABNORMAL LOW (ref 4.22–5.81)
RDW: 13.7 % (ref 11.5–15.5)
WBC: 5.4 10*3/uL (ref 4.0–10.5)
nRBC: 0 % (ref 0.0–0.2)

## 2021-09-28 MED ORDER — SODIUM CHLORIDE 0.9 % IV SOLN
Freq: Once | INTRAVENOUS | Status: AC
Start: 2021-09-28 — End: 2021-09-28
  Filled 2021-09-28: qty 250

## 2021-09-28 MED ORDER — ZOLEDRONIC ACID 4 MG/5ML IV CONC
3.3000 mg | Freq: Once | INTRAVENOUS | Status: AC
  Administered 2021-09-28: 3.3 mg via INTRAVENOUS
  Filled 2021-09-28: qty 4.13

## 2021-09-28 NOTE — Patient Instructions (Signed)

## 2021-09-28 NOTE — Telephone Encounter (Signed)
Form prescription form filled out and faxed with office notes, labs, and demographics to [facility] [phone]). Awaiting response

## 2021-09-29 NOTE — Telephone Encounter (Signed)
Update from [employer] information states inactive. Spoke with patient new card copy was requested. Patient states he will bring by a new card when able. New pharmacy ID numbers were given over the phone. [employer] with updated information and they state that they will attempt PA with new ID numbers.

## 2021-10-03 IMAGING — DX DG CHEST 1V PORT
1 series · 1 of 1 positions shown · non-contrast
Comparison: 06/23/2020.  Bone scan 06 03 2020.  CT 06/03/2020.

CLINICAL DATA: Cough.  Weakness.  Syncope.

EXAM:
PORTABLE CHEST 1 VIEW

[chest ap]
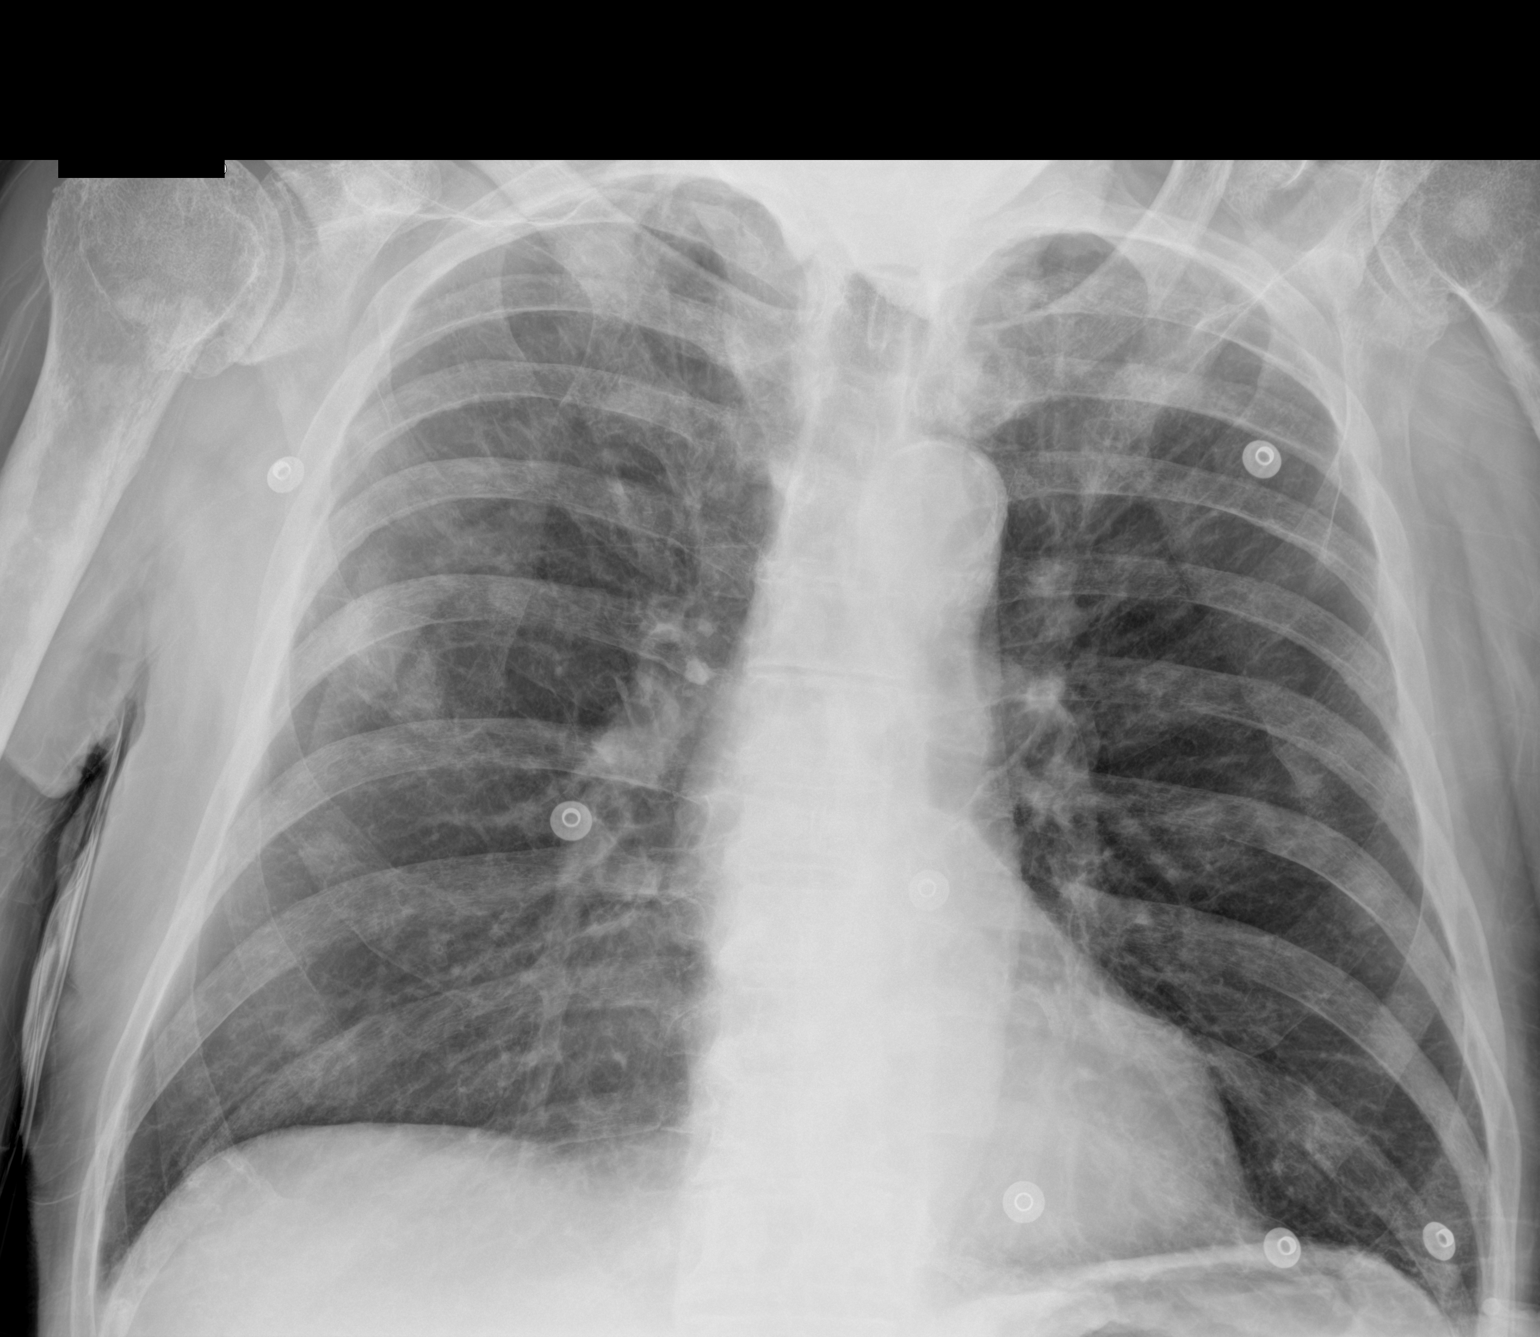

[1 of 1 positions shown; findings below may reference images not displayed]

FINDINGS: Mediastinum and hilar structures normal. Heart size normal.
Prominent skin folds noted on the right. Mild infiltrate right mid
lung cannot be excluded. No pleural effusion or pneumothorax.
Multiple blastic bony densities are again noted throughout the chest
wall consistent with metastatic disease.
IMPRESSION: 1. Prominent skin folds noted on the right. Mild infiltrate right
mid lung cannot be excluded.
2. Multiple blastic bony densities again noted throughout the chest
wall consistent with metastatic disease.

## 2021-10-04 IMAGING — CR DG CHEST 2V
1 series · 2 of 2 positions shown · non-contrast
Comparison: 10/06/2020 and prior.

CLINICAL DATA: Sepsis

EXAM:
CHEST - 2 VIEW

[Series 1: dg chest 2 view · 0.14mm/px · 2 of 2 slices shown]
[im 1/2]
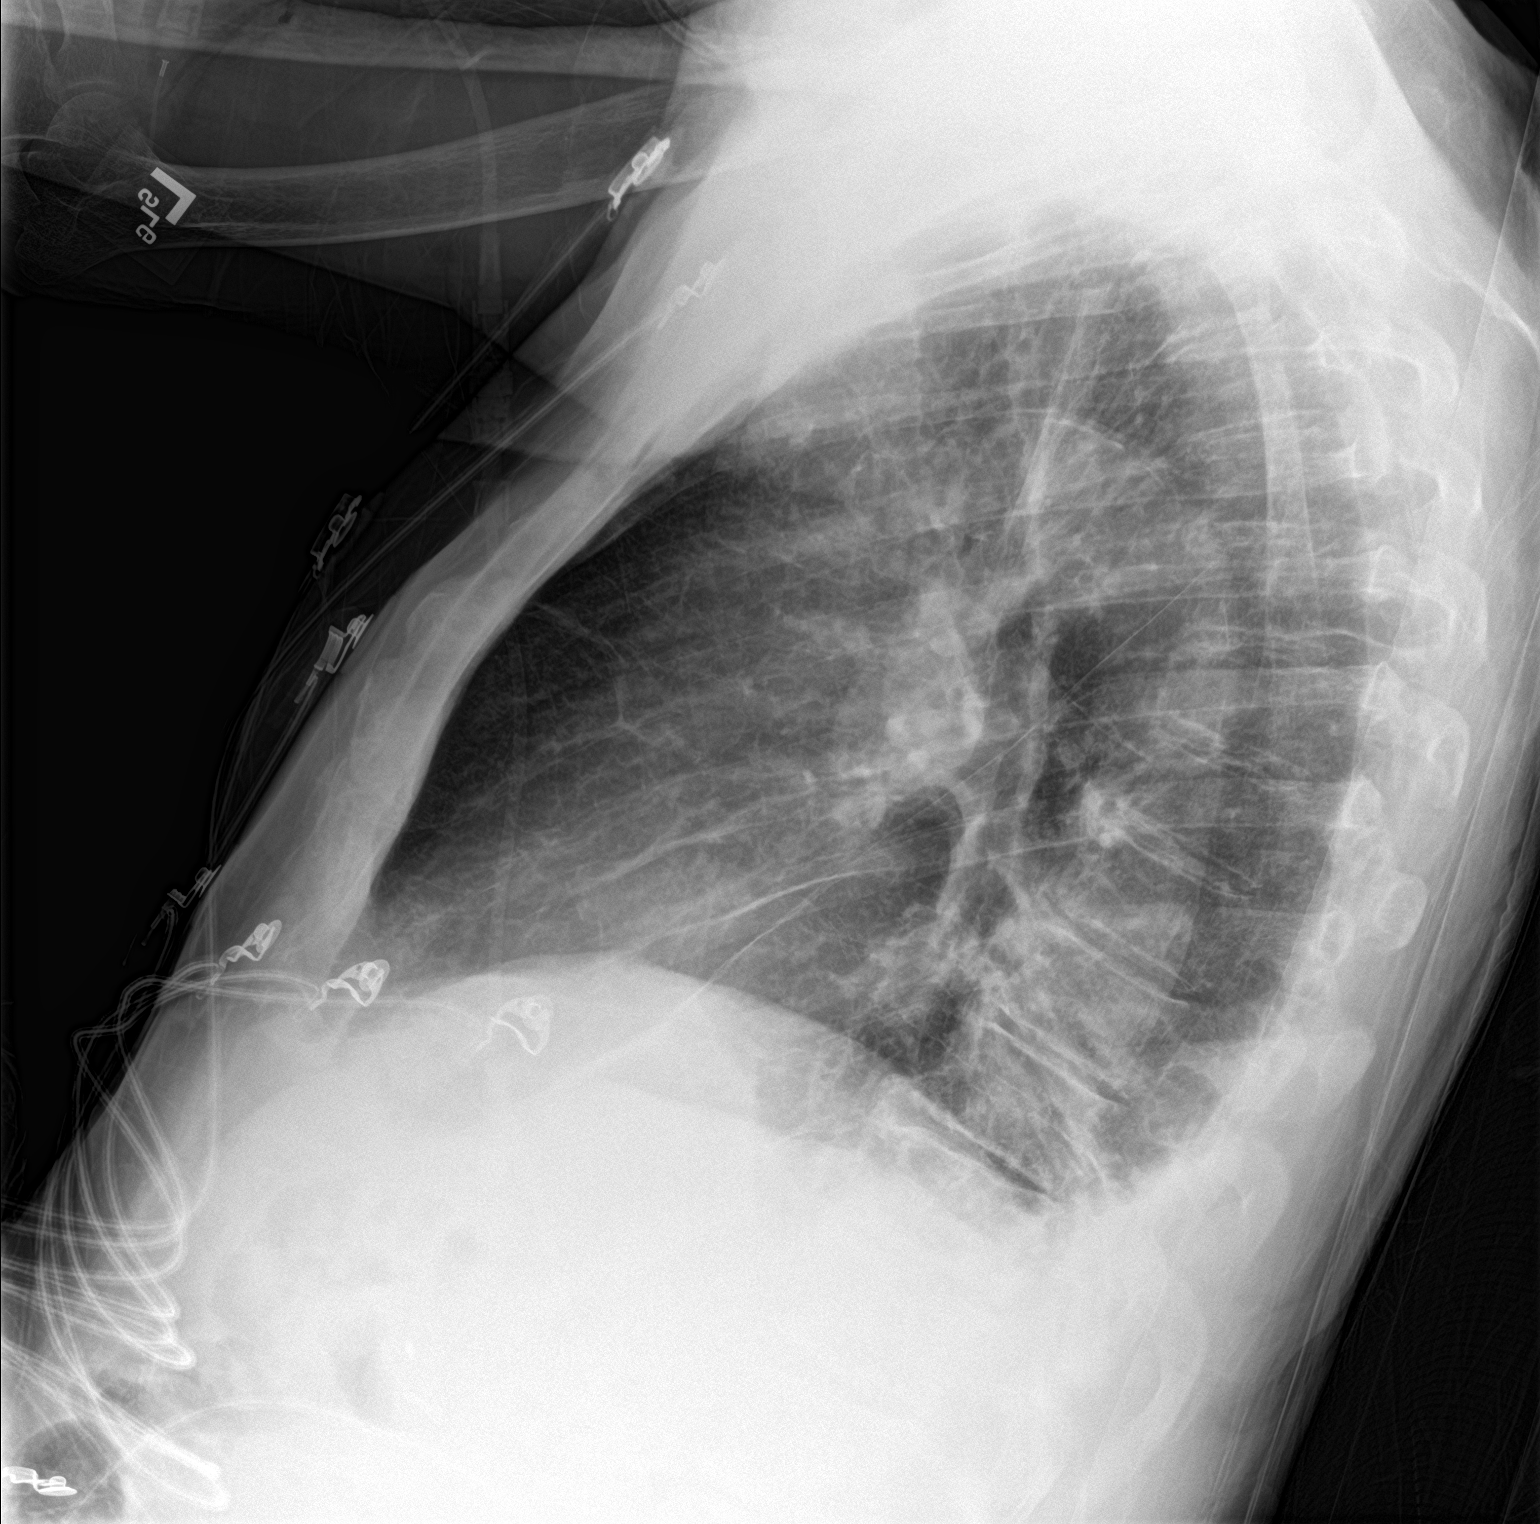
[im 2/2]
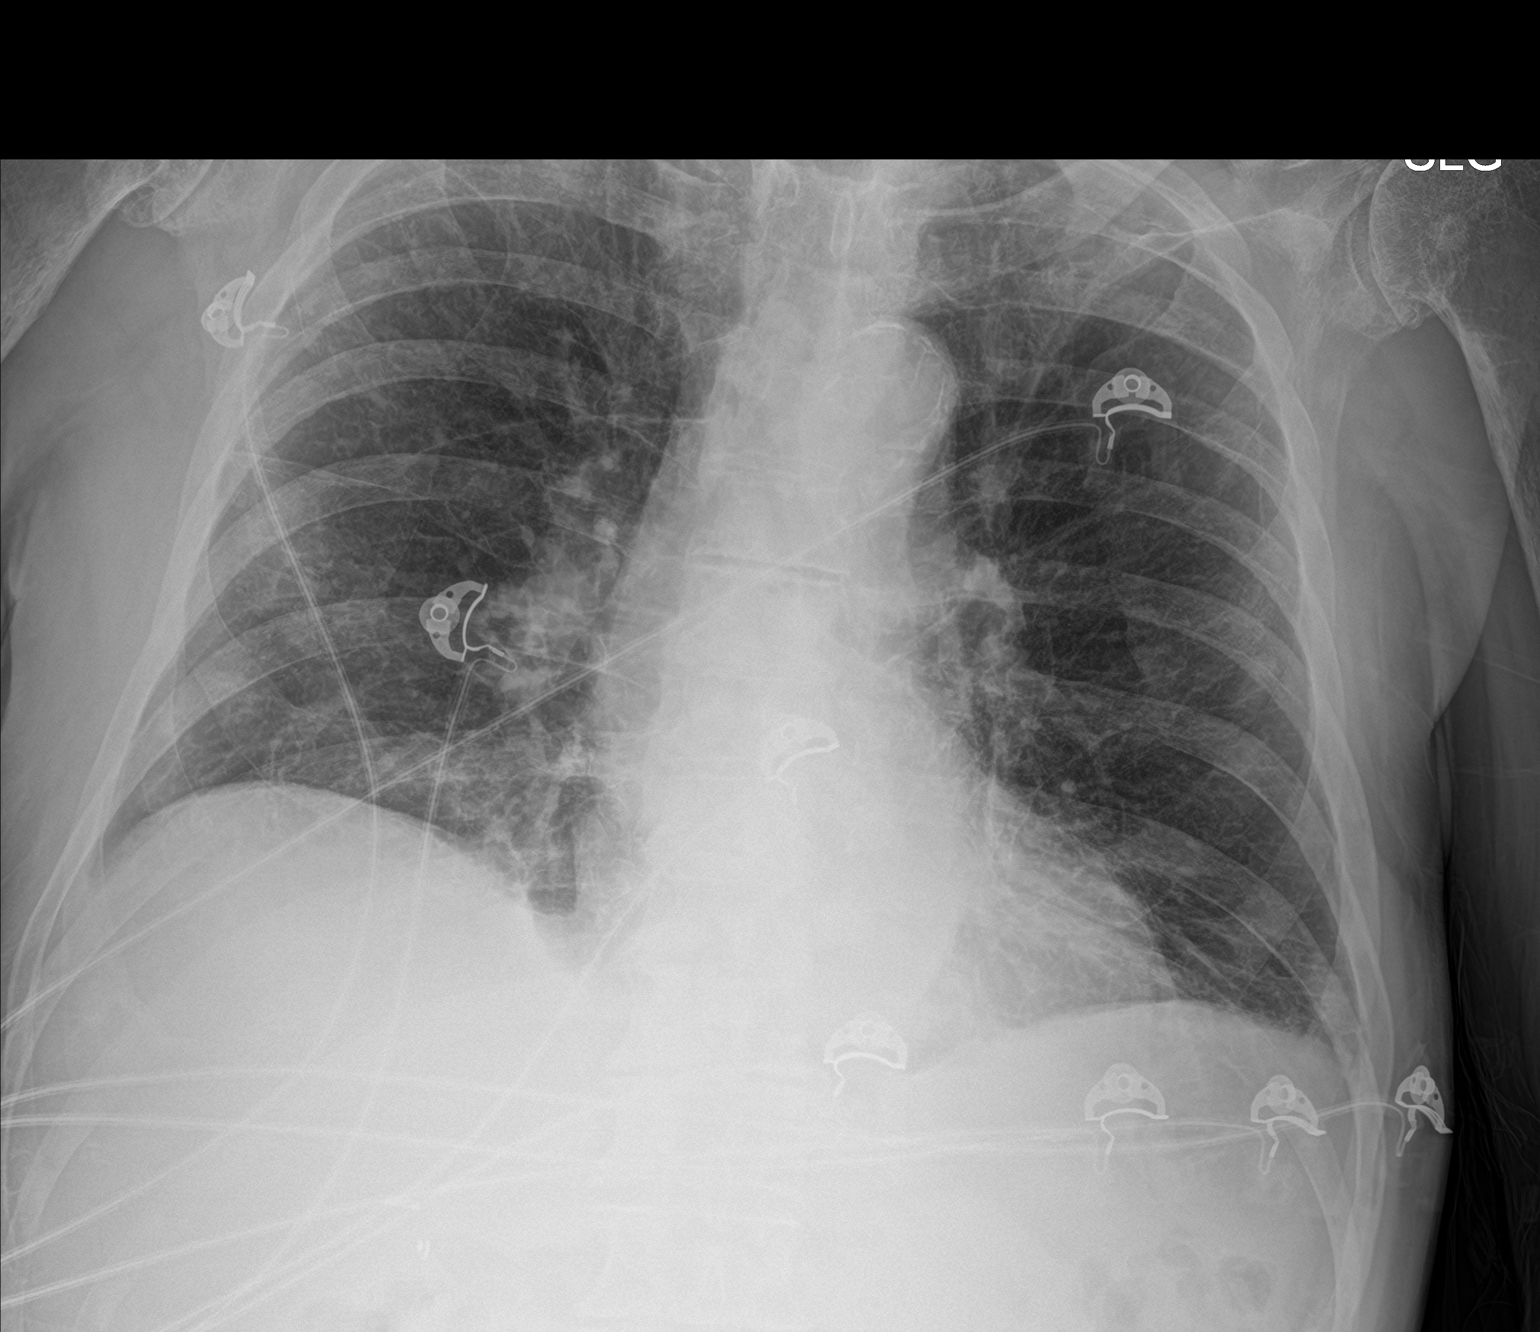

[2 of 2 positions shown; findings below may reference images not displayed]

FINDINGS: No pneumothorax or pleural effusion. Diffuse interstitial prominence
with hazy right greater than left pulmonary opacities, unchanged.
Stable cardiomediastinal silhouette. No acute osseous abnormality.
IMPRESSION: Unchanged appearance of interstitial prominence and hazy bilateral
pulmonary opacities.

## 2021-10-05 ENCOUNTER — Telehealth: Payer: Self-pay

## 2021-10-05 NOTE — Telephone Encounter (Signed)
Patient called stating he received letter from [place]. He is no sure what it is for. I explained to him we have never seen him in office. He is mailing letter to me so I can see what it is for-[name]

## 2021-10-14 ENCOUNTER — Encounter: Payer: Self-pay | Admitting: *Deleted

## 2021-10-15 NOTE — Telephone Encounter (Signed)
Update states Insurance denied mediation due to plan limitations. Appeal letter was written and faxed with notes and labs, awaiting response

## 2021-10-26 NOTE — Progress Notes (Addendum)
10/27/21 2:16 PM   Jason Murphy 22-May-1920 [id number]  Referring provider:  Baxter Hire, MD [place],  [place] [address] Chief Complaint  Patient presents with   Testicle Pain     HPI: Jason Murphy is a 88 y.o.male with a personal history of advanced metastatic prostate cancer on ADT and history of urinary retention previously managed with [facility], who presents today for further evaluation of testicle pain.  His last Eligard injection was in office on 08/25/2021. 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  His most recent PSA on 09/28/2021 was 600.   He reports today that if he sleeps on his left hand side he experiences tenderness in his testicles. This has been ongoing for about a month.   He reports today that he has went through the process of correcting his insurance.  We will control today with approval for an additional p.o. imaging testosterone medication however have hit road blocks due to the lapse in his pharmacy benefits.    PSA trend:  Component Prostate Specific Ag, Serum  Latest Ref Rng & Units 0.0 - 4.0 ng/mL  04/29/2020 2,355.0 (H)  08/03/2020 265.0 (H)  02/17/2021 47.1 (H)  08/25/2021 457.0 (H)  09/08/2021 525.0 (H)  09/28/2021 600.0 (H)      PMH: Past Medical History:  Diagnosis Date   Cancer ([place])    prostate   Coronary artery disease    Dizziness    chronic dizziness   Dyspnea    Fatigue    Progressive fatigue with question of sleep apnea   Hypercholesterolemia    Hypertension    Obstructive sleep apnea    Pneumonia    Restless leg syndrome     Surgical History: Past Surgical History:  Procedure Laterality Date   APPENDECTOMY     CARDIAC CATHETERIZATION     Ejection Fraction was 60%, stent   KNEE SURGERY     arthroscopic left knee surgery   SHOULDER ARTHROSCOPY      Home Medications:  Allergies as of 10/27/2021   No Known Allergies       Medication List        Accurate as of October 27, 2021  2:16 PM. If you have any questions, ask your nurse or doctor.          aspirin EC 81 MG tablet Take 81 mg by mouth at bedtime.   ciprofloxacin 500 MG tablet Commonly known as: CIPRO Take 1 tablet (500 mg total) by mouth every 12 (twelve) hours. Started by: Hollice Espy, MD   co-enzyme Q-10 50 MG capsule Take 100 mg by mouth every morning.   feeding supplement Liqd Take 237 mLs by mouth 3 (three) times daily between meals.   finasteride 5 MG tablet Commonly known as: PROSCAR Take 5 mg by mouth daily.   isosorbide mononitrate 30 MG 24 hr tablet Commonly known as: IMDUR Take 30 mg by mouth daily.   levothyroxine 50 MCG tablet Commonly known as: SYNTHROID Take 50 mcg by mouth daily before breakfast.   meclizine 25 MG tablet Commonly known as: ANTIVERT Take 25 mg by mouth 2 (two) times daily as needed for dizziness.   nitroGLYCERIN 0.4 MG SL tablet Commonly known as: NITROSTAT Place 0.4 mg under the tongue every 5 (five) minutes as needed for chest pain.   omeprazole 20 MG capsule Commonly known as: PRILOSEC Take 20 mg by mouth daily.   PRESERVISION  AREDS 2 PO Take 1 tablet by mouth daily.   rOPINIRole 1 MG tablet Commonly known as: REQUIP Take 1 mg by mouth 3 (three) times daily.   simvastatin 40 MG tablet Commonly known as: ZOCOR TAKE 1 TABLET BY MOUTH EVERY NIGHT AT BEDTIME What changed: when to take this   vitamin B-12 500 MCG tablet Commonly known as: CYANOCOBALAMIN Take 500 mcg by mouth every morning.   Vitamin D3 25 MCG (1000 UT) Caps Take 1,000 Units by mouth daily.        Allergies: No Known Allergies  Family History: Family History  Problem Relation Age of Onset   COPD Mother     Social History:  reports that he has never smoked. He has never used smokeless tobacco. He reports current alcohol use. He reports that he does not use drugs.   Physical Exam: BP (!) 156/75     Pulse 67    Ht 5\' 9"  (1.753 m)    Wt 160 lb (72.6 kg)    BMI 23.63 kg/m   Constitutional:  Alert and oriented, No acute distress. HEENT: [place] AT, moist mucus membranes.  Trachea midline, no masses. Cardiovascular: No clubbing, cyanosis, or edema. Respiratory: Normal respiratory effort, no increased work of breathing. FI:EPPIRJJO left upper epididymitis, normal right descended testicle  Skin: No rashes, bruises or suspicious lesions. Neurologic: Grossly intact, no focal deficits, moving all 4 extremities. Psychiatric: Normal mood and affect.  Laboratory Data:  Lab Results  Component Value Date   CREATININE 0.93 09/28/2021   Lab Results  Component Value Date   TESTOSTERONE <3 (L) 09/08/2021    Urinalysis Nitrite positive with  11-30 WBCs, 0-2 RBCs, and many bacteria  Pertinent Imaging: Results for orders placed or performed in visit on 10/27/21  BLADDER SCAN AMB NON-IMAGING  Result Value Ref Range   Scan Result 0 ml     Assessment & Plan:    Prostate cancer  - Advanced metastatic - PSA risen on most recent blood draw concerning for interval development of castrate resistance - Currently only on ADT alone with palliative intent we are trying to appeal for introduction of oral agent. PAP program will reach out to him regarding Zytiga medication.  -Indicates today that he is correcting his pharmacy benefit issue, will reach out to his insurance company again to see if this is been correct.  Try to expedite getting his medications.  I explained the importance of this medication at length including the risk of progression with echo looks like in terms of symptoms including morbidity/mortality.  2. UTI  and left epididymitis  - Urine today was nitrite positive with  11-30 WBCs, 0-2 RBCs, and many bacteria  - Exam today showed exquisite left epididymitis  - Will treat him with a 2 week course of Ciprofloxacin   /fu May as scheduled  I,Kailey Littlejohn,acting as a scribe for  Hollice Espy, MD.,have documented all relevant documentation on the behalf of Hollice Espy, MD,as directed by  Hollice Espy, MD while in the presence of Hollice Espy, MD.  I have reviewed the above documentation for accuracy and completeness, and I agree with the above.   Hollice Espy, MD   [facility] [address] [place], [place] [address] [phone]   I spent 37 total minutes on the day of the encounter including pre-visit review of the medical record, face-to-face time with the patient, and post visit ordering of labs/imaging/tests.

## 2021-10-27 ENCOUNTER — Ambulatory Visit (INDEPENDENT_AMBULATORY_CARE_PROVIDER_SITE_OTHER): Payer: Medicare PPO | Admitting: Urology

## 2021-10-27 ENCOUNTER — Other Ambulatory Visit: Payer: Self-pay

## 2021-10-27 VITALS — BP 156/75 | HR 67 | Ht 69.0 in | Wt 160.0 lb

## 2021-10-27 DIAGNOSIS — N50819 Testicular pain, unspecified: Secondary | ICD-10-CM

## 2021-10-27 DIAGNOSIS — C61 Malignant neoplasm of prostate: Secondary | ICD-10-CM | POA: Diagnosis not present

## 2021-10-27 LAB — BLADDER SCAN AMB NON-IMAGING: Scan Result: 0

## 2021-10-27 MED ORDER — CIPROFLOXACIN HCL 500 MG PO TABS: 500.0000 mg | ORAL_TABLET | Freq: Two times a day (BID) | ORAL | 0 refills | Status: DC

## 2021-10-28 ENCOUNTER — Encounter: Payer: Self-pay | Admitting: Oncology

## 2021-10-28 ENCOUNTER — Telehealth: Payer: Self-pay | Admitting: *Deleted

## 2021-10-28 DIAGNOSIS — C61 Malignant neoplasm of prostate: Secondary | ICD-10-CM

## 2021-10-28 LAB — URINALYSIS, COMPLETE
Bilirubin, UA: NEGATIVE
Glucose, UA: NEGATIVE
Nitrite, UA: POSITIVE — AB
RBC, UA: NEGATIVE
Specific Gravity, UA: 1.02 (ref 1.005–1.030)
Urobilinogen, Ur: 0.2 mg/dL (ref 0.2–1.0)
pH, UA: 6 (ref 5.0–7.5)

## 2021-10-28 LAB — MICROSCOPIC EXAMINATION

## 2021-10-28 NOTE — Telephone Encounter (Signed)
Spoke with [facility] regarding prescription coverage. Coverage starts 11/17/21. Left Vm for patient to return call.

## 2021-10-29 LAB — CULTURE, URINE COMPREHENSIVE

## 2021-11-01 ENCOUNTER — Telehealth: Payer: Self-pay | Admitting: *Deleted

## 2021-11-01 MED ORDER — AMOXICILLIN-POT CLAVULANATE 875-125 MG PO TABS: 1.0000 | ORAL_TABLET | Freq: Two times a day (BID) | ORAL | 0 refills | Status: DC

## 2021-11-01 NOTE — Telephone Encounter (Addendum)
Spoke with patient, voiced understanding.  Sent in Augmentin to [facility].   ----- Message from Hollice Espy, MD sent at 11/01/2021  7:51 AM EST ----- Looks like the bacteria is growing might be resistant or not effectively treated by Cipro.  Please have him change to Augmentin twice a day for 2 weeks.  Hollice Espy, MD

## 2021-11-17 ENCOUNTER — Telehealth: Payer: Self-pay | Admitting: Urology

## 2021-11-17 MED ORDER — ENZALUTAMIDE 80 MG PO TABS: 160.0000 mg | ORAL_TABLET | Freq: Every day | ORAL | 1 refills | Status: DC

## 2021-11-17 NOTE — Telephone Encounter (Signed)
Arbie Cookey from [facility] [phone]) called about this patient.  She says that she is not able to get Xtandi at their pharmacy.

## 2021-11-17 NOTE — Telephone Encounter (Addendum)
Spoke with patient sent in [place] to take twice a day and scheduled follow up PSA in one month. Voiced understanding.

## 2021-11-17 NOTE — Addendum Note (Signed)
Addended by: Verlene Mayer A on: 11/17/2021 11:05 AM   Modules accepted: Orders

## 2021-11-17 NOTE — Telephone Encounter (Signed)
Spoke with Arbie Cookey, sent RX to [facility]

## 2021-11-24 NOTE — Telephone Encounter (Signed)
Spoke with [employer], has been referred to the patient assistance program to get [place] covered due to costs

## 2021-12-02 NOTE — Telephone Encounter (Signed)
Spoke with patient, received Xtandi. Has been taking for a couple of days. Has been having some mild fatigue. Explained side effects. Will follow up as scheduled.

## 2021-12-15 ENCOUNTER — Other Ambulatory Visit: Payer: Self-pay | Admitting: *Deleted

## 2021-12-15 DIAGNOSIS — C61 Malignant neoplasm of prostate: Secondary | ICD-10-CM

## 2021-12-16 ENCOUNTER — Other Ambulatory Visit: Payer: Self-pay

## 2021-12-16 ENCOUNTER — Other Ambulatory Visit: Payer: Medicare PPO

## 2021-12-16 DIAGNOSIS — C61 Malignant neoplasm of prostate: Secondary | ICD-10-CM

## 2021-12-16 NOTE — Progress Notes (Deleted)
?[place]  ?[phone]) [phone] [phone] ? ?ID: Jason Murphy OB: [birth year]  MR#: [id number]  BMW#:[id number] ? ?Patient Care Team: ?Baxter Hire, MD as PCP - General (Internal Medicine) ?Lloyd Huger, MD as Consulting Physician (Oncology) ? ?CHIEF COMPLAINT: Stage IV prostate cancer. ? ?INTERVAL HISTORY: Patient last evaluated in clinic in October 2021.  He is referred back for progressive metastatic prostate cancer and reinitiation of Zometa.  He recently received Eligard urology approximately 1 to 2 weeks ago.  Currently feels well and is asymptomatic.  He does not complain of any pain.  He still remains active and drove him self to clinic today.  He has no neurologic complaints.  He denies any recent fevers or illnesses.  He has a good appetite and denies weight loss.  He has no chest pain, shortness of breath, cough, or hemoptysis.  He denies any nausea, vomiting, constipation, or diarrhea.  He has no urinary complaints.  Patient offers no specific complaints today. ? ?REVIEW OF SYSTEMS:   ?Review of Systems  ?Constitutional: Negative.  Negative for fever, malaise/fatigue and weight loss.  ?Respiratory: Negative.  Negative for cough, hemoptysis and shortness of breath.   ?Cardiovascular: Negative.  Negative for chest pain and leg swelling.  ?Gastrointestinal: Negative.  Negative for abdominal pain.  ?Genitourinary: Negative.  Negative for dysuria.  ?Musculoskeletal: Negative.  Negative for back pain.  ?Skin: Negative.  Negative for rash.  ?Neurological: Negative.  Negative for dizziness, focal weakness, weakness and headaches.  ?Psychiatric/Behavioral: Negative.  The patient is not nervous/anxious.   ? ?As per HPI. Otherwise, a complete review of systems is negative. ? ?PAST MEDICAL HISTORY: ?Past Medical History:  ?Diagnosis Date  ? Cancer [facility])   ? prostate  ? Coronary artery disease   ? Dizziness   ? chronic dizziness  ? Dyspnea   ? Fatigue   ? Progressive  fatigue with question of sleep apnea  ? Hypercholesterolemia   ? Hypertension   ? Obstructive sleep apnea   ? Pneumonia   ? Restless leg syndrome   ? ? ?PAST SURGICAL HISTORY: ?Past Surgical History:  ?Procedure Laterality Date  ? APPENDECTOMY    ? CARDIAC CATHETERIZATION    ? Ejection Fraction was 60%, stent  ? KNEE SURGERY    ? arthroscopic left knee surgery  ? SHOULDER ARTHROSCOPY    ? ? ?FAMILY HISTORY: ?Family History  ?Problem Relation Age of Onset  ? COPD Mother   ? ? ?ADVANCED DIRECTIVES (Y/N):  N ? ?HEALTH MAINTENANCE: ?Social History  ? ?Tobacco Use  ? Smoking status: Never  ? Smokeless tobacco: Never  ?Vaping Use  ? Vaping Use: Never used  ?Substance Use Topics  ? Alcohol use: Yes  ?  Comment: 1/2 glass wine daily  ? Drug use: No  ? ? ? Colonoscopy: ? PAP: ? Bone density: ? Lipid panel: ? ?No Known Allergies ? ?Current Outpatient Medications  ?Medication Sig Dispense Refill  ? amoxicillin-clavulanate (AUGMENTIN) 875-125 MG tablet Take 1 tablet by mouth 2 (two) times daily. 28 tablet 0  ? aspirin EC 81 MG tablet Take 81 mg by mouth at bedtime.    ? Cholecalciferol (VITAMIN D3) 1000 units CAPS Take 1,000 Units by mouth daily.    ? ciprofloxacin (CIPRO) 500 MG tablet Take 1 tablet (500 mg total) by mouth every 12 (twelve) hours. 10 tablet 0  ? co-enzyme Q-10 50 MG capsule Take 100 mg by mouth every morning.    ? cyanocobalamin 500 MCG  tablet Take 500 mcg by mouth every morning.    ? enzalutamide (XTANDI) 80 MG tablet Take 2 tablets (160 mg total) by mouth daily. 60 tablet 1  ? feeding supplement (ENSURE ENLIVE / ENSURE PLUS) LIQD Take 237 mLs by mouth 3 (three) times daily between meals. 237 mL 12  ? finasteride (PROSCAR) 5 MG tablet Take 5 mg by mouth daily.    ? isosorbide mononitrate (IMDUR) 30 MG 24 hr tablet Take 30 mg by mouth daily.    ? levothyroxine (SYNTHROID, LEVOTHROID) 50 MCG tablet Take 50 mcg by mouth daily before breakfast.    ? meclizine (ANTIVERT) 25 MG tablet Take 25 mg by mouth 2 (two)  times daily as needed for dizziness.    ? Multiple Vitamins-Minerals (PRESERVISION AREDS 2 PO) Take 1 tablet by mouth daily.    ? nitroGLYCERIN (NITROSTAT) 0.4 MG SL tablet Place 0.4 mg under the tongue every 5 (five) minutes as needed for chest pain.    ? omeprazole (PRILOSEC) 20 MG capsule Take 20 mg by mouth daily.    ? rOPINIRole (REQUIP) 1 MG tablet Take 1 mg by mouth 3 (three) times daily.    ? simvastatin (ZOCOR) 40 MG tablet TAKE 1 TABLET BY MOUTH EVERY NIGHT AT BEDTIME (Patient taking differently: Take 40 mg by mouth daily at 6 PM.) 30 tablet 5  ? ?No current facility-administered medications for this visit.  ? ? ?OBJECTIVE: ?There were no vitals filed for this visit. ?   There is no height or weight on file to calculate BMI.    ECOG FS:0 - Asymptomatic ? ?General: Well-developed, well-nourished, no acute distress. ?Eyes: Pink conjunctiva, anicteric sclera. ?HEENT: Normocephalic, moist mucous membranes. ?Lungs: No audible wheezing or coughing. ?Heart: Regular rate and rhythm. ?Abdomen: Soft, nontender, no obvious distention. ?Musculoskeletal: No edema, cyanosis, or clubbing. ?Neuro: Alert, answering all questions appropriately. Cranial nerves grossly intact. ?Skin: No rashes or petechiae noted. ?Psych: Normal affect. ? ?LAB RESULTS: ? ?Lab Results  ?Component Value Date  ? NA 138 09/28/2021  ? K 3.7 09/28/2021  ? CL 103 09/28/2021  ? CO2 23 09/28/2021  ? GLUCOSE 112 (H) 09/28/2021  ? BUN 26 (H) 09/28/2021  ? CREATININE 0.93 09/28/2021  ? CALCIUM 9.0 09/28/2021  ? PROT 6.9 09/28/2021  ? ALBUMIN 3.8 09/28/2021  ? AST 25 09/28/2021  ? ALT 12 09/28/2021  ? ALKPHOS 77 09/28/2021  ? BILITOT 0.6 09/28/2021  ? GFRNONAA >60 09/28/2021  ? GFRAA >60 06/23/2020  ? ? ?Lab Results  ?Component Value Date  ? WBC 5.4 09/28/2021  ? NEUTROABS 2.6 09/28/2021  ? HGB 11.7 (L) 09/28/2021  ? HCT 34.9 (L) 09/28/2021  ? MCV 97.5 09/28/2021  ? PLT 244 09/28/2021  ? ? ? ?STUDIES: ?No results found. ? ?ASSESSMENT: Stage IV prostate  cancer. ? ?PLAN:   ? ?1.  Stage IV prostate cancer: Upon diagnosis in July 2021, patient's PSA was reported greater than 2000.  It has trended up from a low of 47.1 in May 2022, the patient has not received any treatment since at least that time.  He last received Eligard at urology on August 25, 2021.  Patient also benefit from Zometa which he last received on August 20, 2020.  He will return to clinic next week to reinitiate Zometa.  Patient will then return to clinic in 3 months for further evaluation and continuation of treatment.  Continue follow-up with urology as scheduled.   ? ?I spent a total of 30 minutes reviewing  chart data, face-to-face evaluation with the patient, counseling and coordination of care as detailed above. ? ? ? ?Patient expressed understanding and was in agreement with this plan. He also understands that He can call clinic at any time with any questions, concerns, or complaints.  ? ? Cancer Staging  ?PROSTATE CANCER ?Staging form: Prostate, AJCC 7th Edition ?- Clinical: Stage IV (TX, NX, M1b, PSA: 20 or greater) - Signed by Lloyd Huger, MD on 08/15/2020 ? ? ?Lloyd Huger, MD   12/16/2021 11:07 AM ? ? ? ? ?

## 2021-12-17 LAB — PSA: Prostate Specific Ag, Serum: 244 ng/mL — ABNORMAL HIGH (ref 0.0–4.0)

## 2021-12-20 NOTE — Progress Notes (Incomplete)
? ?01-11-2022 ?8:26 AM  ? ?Jason Murphy ?[birth year] ?[id number] ? ?Referring provider:  ?Baxter Hire, MD ?[address] ?[place],  [place] [address] ?No chief complaint on file. ? ? ? ?HPI: ?Jason Murphy is a 88 y.o.male with a personal history of advanced metastatic prostate cancer on ADT,  urinary retention previously managed with CIC, and left epididymitis, who presents today for a 1 month follow-up with PSA.  ? ?His last Eligard injection was in office on 08/25/2021. 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  His most recent PSA on 12/16/2021 was 244. ? ?He was treated with a 2 week course of Ciprofloxacin on 10/27/2021. Urine grew Aerococcus urinae.  ? ?PSA trend: ?Component Prostate Specific Ag, Serum  ?Latest Ref Rng & Units 0.0 - 4.0 ng/mL  ?04/29/2020 2,355.0 (H)  ?08/03/2020 265.0 (H)  ?02/17/2021 47.1 (H)  ?08/25/2021 457.0 (H)  ?09/08/2021 525.0 (H)  ?12/16/2021 244.0 (H)  ? ? ? ? ? ?PMH: ?Past Medical History:  ?Diagnosis Date  ? Cancer [facility])   ? prostate  ? Coronary artery disease   ? Dizziness   ? chronic dizziness  ? Dyspnea   ? Fatigue   ? Progressive fatigue with question of sleep apnea  ? Hypercholesterolemia   ? Hypertension   ? Obstructive sleep apnea   ? Pneumonia   ? Restless leg syndrome   ? ? ?Surgical History: ?Past Surgical History:  ?Procedure Laterality Date  ? APPENDECTOMY    ? CARDIAC CATHETERIZATION    ? Ejection Fraction was 60%, stent  ? KNEE SURGERY    ? arthroscopic left knee surgery  ? SHOULDER ARTHROSCOPY    ? ? ?Home Medications:  ?Allergies as of 12/21/2021   ?No Known Allergies ?  ? ?  ?Medication List  ?  ? ?  ? Accurate as of December 20, 2021  8:26 AM. If you have any questions, ask your nurse or doctor.  ?  ?  ? ?  ? ?amoxicillin-clavulanate 875-125 MG tablet ?Commonly known as: AUGMENTIN ?Take 1 tablet by mouth 2 (two) times daily. ?  ?aspirin EC 81 MG tablet ?Take 81 mg by mouth at bedtime. ?   ?ciprofloxacin 500 MG tablet ?Commonly known as: CIPRO ?Take 1 tablet (500 mg total) by mouth every 12 (twelve) hours. ?  ?co-enzyme Q-10 50 MG capsule ?Take 100 mg by mouth every morning. ?  ?enzalutamide 80 MG tablet ?Commonly known as: XTANDI ?Take 2 tablets (160 mg total) by mouth daily. ?  ?feeding supplement Liqd ?Take 237 mLs by mouth 3 (three) times daily between meals. ?  ?finasteride 5 MG tablet ?Commonly known as: PROSCAR ?Take 5 mg by mouth daily. ?  ?isosorbide mononitrate 30 MG 24 hr tablet ?Commonly known as: IMDUR ?Take 30 mg by mouth daily. ?  ?levothyroxine 50 MCG tablet ?Commonly known as: SYNTHROID ?Take 50 mcg by mouth daily before breakfast. ?  ?meclizine 25 MG tablet ?Commonly known as: ANTIVERT ?Take 25 mg by mouth 2 (two) times daily as needed for dizziness. ?  ?nitroGLYCERIN 0.4 MG SL tablet ?Commonly known as: NITROSTAT ?Place 0.4 mg under the tongue every 5 (five) minutes as needed for chest pain. ?  ?omeprazole 20 MG capsule ?Commonly known as: PRILOSEC ?Take 20 mg by mouth daily. ?  ?PRESERVISION AREDS 2 PO ?Take 1 tablet by mouth daily. ?  ?rOPINIRole 1 MG tablet ?Commonly known as: REQUIP ?Take 1 mg by mouth 3 (  three) times daily. ?  ?simvastatin 40 MG tablet ?Commonly known as: ZOCOR ?TAKE 1 TABLET BY MOUTH EVERY NIGHT AT BEDTIME ?What changed: when to take this ?  ?vitamin B-12 500 MCG tablet ?Commonly known as: CYANOCOBALAMIN ?Take 500 mcg by mouth every morning. ?  ?Vitamin D3 25 MCG (1000 UT) Caps ?Take 1,000 Units by mouth daily. ?  ? ?  ? ? ?Allergies: No Known Allergies ? ?Family History: ?Family History  ?Problem Relation Age of Onset  ? COPD Mother   ? ? ?Social History:  reports that he has never smoked. He has never used smokeless tobacco. He reports current alcohol use. He reports that he does not use drugs. ? ? ?Physical Exam: ?There were no vitals taken for this visit.  ?Constitutional:  Alert and oriented, No acute distress. ?HEENT: Bent AT, moist mucus membranes.   Trachea midline, no masses. ?Cardiovascular: No clubbing, cyanosis, or edema. ?Respiratory: Normal respiratory effort, no increased work of breathing. ?Skin: No rashes, bruises or suspicious lesions. ?Neurologic: Grossly intact, no focal deficits, moving all 4 extremities. ?Psychiatric: Normal mood and affect. ? ?Laboratory Data: ?Lab Results  ?Component Value Date  ? CREATININE 0.93 09/28/2021  ? ?Lab Results  ?Component Value Date  ? TESTOSTERONE <3 (L) 09/08/2021  ? ? ?Urinalysis ? ? ?Pertinent Imaging: ? ? ? ?Assessment & Plan:   ? ? ?No follow-ups on file. ? ?I,Kailey Littlejohn,acting as a scribe for Hollice Espy, MD.,have documented all relevant documentation on the behalf of Hollice Espy, MD,as directed by  Hollice Espy, MD while in the presence of Hollice Espy, MD. ? ?[facility] ?[address], [address] ?[place],  [address] ?[phone] ?

## 2021-12-21 ENCOUNTER — Inpatient Hospital Stay: Payer: Medicare PPO

## 2021-12-21 ENCOUNTER — Inpatient Hospital Stay: Payer: Medicare PPO | Admitting: Oncology

## 2021-12-21 ENCOUNTER — Ambulatory Visit: Payer: Medicare PPO | Admitting: Urology

## 2021-12-21 DIAGNOSIS — C61 Malignant neoplasm of prostate: Secondary | ICD-10-CM

## 2021-12-23 ENCOUNTER — Inpatient Hospital Stay: Payer: Medicare PPO | Attending: Oncology

## 2021-12-23 ENCOUNTER — Other Ambulatory Visit: Payer: Self-pay

## 2021-12-23 ENCOUNTER — Encounter: Payer: Self-pay | Admitting: Oncology

## 2021-12-23 ENCOUNTER — Inpatient Hospital Stay (HOSPITAL_BASED_OUTPATIENT_CLINIC_OR_DEPARTMENT_OTHER): Payer: Medicare PPO | Admitting: Oncology

## 2021-12-23 ENCOUNTER — Inpatient Hospital Stay: Payer: Medicare PPO

## 2021-12-23 VITALS — BP 124/74 | HR 64 | Temp 98.6°F | Wt 164.0 lb

## 2021-12-23 DIAGNOSIS — C61 Malignant neoplasm of prostate: Secondary | ICD-10-CM

## 2021-12-23 DIAGNOSIS — C7951 Secondary malignant neoplasm of bone: Secondary | ICD-10-CM | POA: Diagnosis not present

## 2021-12-23 DIAGNOSIS — R972 Elevated prostate specific antigen [PSA]: Secondary | ICD-10-CM | POA: Diagnosis not present

## 2021-12-23 LAB — CBC WITH DIFFERENTIAL/PLATELET
Abs Immature Granulocytes: 0.02 10*3/uL (ref 0.00–0.07)
Basophils Absolute: 0 10*3/uL (ref 0.0–0.1)
Basophils Relative: 0 %
Eosinophils Absolute: 0.1 10*3/uL (ref 0.0–0.5)
Eosinophils Relative: 2 %
HCT: 37.8 % — ABNORMAL LOW (ref 39.0–52.0)
Hemoglobin: 12.4 g/dL — ABNORMAL LOW (ref 13.0–17.0)
Immature Granulocytes: 0 %
Lymphocytes Relative: 30 %
Lymphs Abs: 1.9 10*3/uL (ref 0.7–4.0)
MCH: 31.7 pg (ref 26.0–34.0)
MCHC: 32.8 g/dL (ref 30.0–36.0)
MCV: 96.7 fL (ref 80.0–100.0)
Monocytes Absolute: 0.6 10*3/uL (ref 0.1–1.0)
Monocytes Relative: 10 %
Neutro Abs: 3.7 10*3/uL (ref 1.7–7.7)
Neutrophils Relative %: 58 %
Platelets: 275 10*3/uL (ref 150–400)
RBC: 3.91 MIL/uL — ABNORMAL LOW (ref 4.22–5.81)
RDW: 13.2 % (ref 11.5–15.5)
WBC: 6.4 10*3/uL (ref 4.0–10.5)
nRBC: 0 % (ref 0.0–0.2)

## 2021-12-23 LAB — COMPREHENSIVE METABOLIC PANEL
ALT: 8 U/L (ref 0–44)
AST: 19 U/L (ref 15–41)
Albumin: 3.8 g/dL (ref 3.5–5.0)
Alkaline Phosphatase: 78 U/L (ref 38–126)
Anion gap: 7 (ref 5–15)
BUN: 22 mg/dL (ref 8–23)
CO2: 26 mmol/L (ref 22–32)
Calcium: 8.6 mg/dL — ABNORMAL LOW (ref 8.9–10.3)
Chloride: 102 mmol/L (ref 98–111)
Creatinine, Ser: 0.93 mg/dL (ref 0.61–1.24)
GFR, Estimated: >60 mL/min (ref >60)
Glucose, Bld: 128 mg/dL — ABNORMAL HIGH (ref 70–99)
Potassium: 3.7 mmol/L (ref 3.5–5.1)
Sodium: 135 mmol/L (ref 135–145)
Total Bilirubin: 0.7 mg/dL (ref 0.3–1.2)
Total Protein: 7.3 g/dL (ref 6.5–8.1)

## 2021-12-23 LAB — PSA: Prostatic Specific Antigen: 131.7 ng/mL — ABNORMAL HIGH (ref 0.00–4.00)

## 2021-12-23 MED ORDER — SODIUM CHLORIDE 0.9 % IV SOLN
Freq: Once | INTRAVENOUS | Status: AC
  Filled 2021-12-23: qty 250

## 2021-12-23 MED ORDER — ZOLEDRONIC ACID 4 MG/100ML IV SOLN: 4.0000 mg | Freq: Once | INTRAVENOUS | Status: DC

## 2021-12-23 MED ORDER — ZOLEDRONIC ACID 4 MG/100ML IV SOLN
3.3000 mg | Freq: Once | INTRAVENOUS | Status: AC
  Administered 2021-12-23: 15:00:00 3.3 mg via INTRAVENOUS
  Filled 2021-12-23: qty 100

## 2021-12-23 NOTE — Progress Notes (Signed)
Per Dr. Grayland Ormond, ok to proceed with Zometa with a calcium level of 8.6 today ?

## 2021-12-23 NOTE — Progress Notes (Signed)
Since ?Jason Murphy  ?Telephone:([phone]) [phone] [phone] ? ?ID: Jason Murphy OB: [birth year]  MR#: [id number]  ZHY#:[id number] ? ?Patient Care Team: ?Jason Hire, MD as PCP - General (Internal Medicine) ?Jason Huger, MD as Consulting Physician (Oncology) ? ?CHIEF COMPLAINT: Stage IV prostate cancer. ? ?INTERVAL HISTORY: Patient returns to clinic today for repeat laboratory, further evaluation, and continuation of treatment.  He celebrated his 89th birthday.  He currently feels well and is asymptomatic.  He does not complain of any pain.  He still remains active and drove him self to clinic today.  He has no neurologic complaints.  He denies any recent fevers or illnesses.  He has a good appetite and denies weight loss.  He has no chest pain, shortness of breath, cough, or hemoptysis.  He denies any nausea, vomiting, constipation, or diarrhea.  He has no urinary complaints.  Patient offers no specific complaints today. ? ?REVIEW OF SYSTEMS:   ?Review of Systems  ?Constitutional: Negative.  Negative for fever, malaise/fatigue and weight loss.  ?Respiratory: Negative.  Negative for cough, hemoptysis and shortness of breath.   ?Cardiovascular: Negative.  Negative for chest pain and leg swelling.  ?Gastrointestinal: Negative.  Negative for abdominal pain.  ?Genitourinary: Negative.  Negative for dysuria.  ?Musculoskeletal: Negative.  Negative for back pain.  ?Skin: Negative.  Negative for rash.  ?Neurological: Negative.  Negative for dizziness, focal weakness, weakness and headaches.  ?Psychiatric/Behavioral: Negative.  The patient is not nervous/anxious.   ? ?As per HPI. Otherwise, a complete review of systems is negative. ? ?PAST MEDICAL HISTORY: ?Past Medical History:  ?Diagnosis Date  ? Cancer [facility])   ? prostate  ? Coronary artery disease   ? Dizziness   ? chronic dizziness  ? Dyspnea   ? Fatigue   ? Progressive fatigue with question of sleep apnea  ? Hypercholesterolemia   ?  Hypertension   ? Obstructive sleep apnea   ? Pneumonia   ? Restless leg syndrome   ? ? ?PAST SURGICAL HISTORY: ?Past Surgical History:  ?Procedure Laterality Date  ? APPENDECTOMY    ? CARDIAC CATHETERIZATION    ? Ejection Fraction was 60%, stent  ? KNEE SURGERY    ? arthroscopic left knee surgery  ? SHOULDER ARTHROSCOPY    ? ? ?FAMILY HISTORY: ?Family History  ?Problem Relation Age of Onset  ? COPD Mother   ? ? ?ADVANCED DIRECTIVES (Y/N):  N ? ?HEALTH MAINTENANCE: ?Social History  ? ?Tobacco Use  ? Smoking status: Never  ? Smokeless tobacco: Never  ?Vaping Use  ? Vaping Use: Never used  ?Substance Use Topics  ? Alcohol use: Yes  ?  Comment: 1/2 glass wine daily  ? Drug use: No  ? ? ? Colonoscopy: ? PAP: ? Bone density: ? Lipid panel: ? ?No Known Allergies ? ?Current Outpatient Medications  ?Medication Sig Dispense Refill  ? aspirin EC 81 MG tablet Take 81 mg by mouth at bedtime.    ? Cholecalciferol (VITAMIN D3) 1000 units CAPS Take 1,000 Units by mouth daily.    ? ciprofloxacin (CIPRO) 500 MG tablet Take 1 tablet (500 mg total) by mouth every 12 (twelve) hours. 10 tablet 0  ? co-enzyme Q-10 50 MG capsule Take 100 mg by mouth every morning.    ? cyanocobalamin 500 MCG tablet Take 500 mcg by mouth every morning.    ? enzalutamide (XTANDI) 80 MG tablet Take 2 tablets (160 mg total) by mouth daily. 60 tablet 1  ?  feeding supplement (ENSURE ENLIVE / ENSURE PLUS) LIQD Take 237 mLs by mouth 3 (three) times daily between meals. 237 mL 12  ? finasteride (PROSCAR) 5 MG tablet Take 5 mg by mouth daily.    ? isosorbide mononitrate (IMDUR) 30 MG 24 hr tablet Take 30 mg by mouth daily.    ? levothyroxine (SYNTHROID, LEVOTHROID) 50 MCG tablet Take 50 mcg by mouth daily before breakfast.    ? meclizine (ANTIVERT) 25 MG tablet Take 25 mg by mouth 2 (two) times daily as needed for dizziness.    ? Multiple Vitamins-Minerals (PRESERVISION AREDS 2 PO) Take 1 tablet by mouth daily.    ? nitroGLYCERIN (NITROSTAT) 0.4 MG SL tablet Place  0.4 mg under the tongue every 5 (five) minutes as needed for chest pain.    ? omeprazole (PRILOSEC) 20 MG capsule Take 20 mg by mouth daily.    ? rOPINIRole (REQUIP) 1 MG tablet Take 1 mg by mouth 3 (three) times daily.    ? simvastatin (ZOCOR) 40 MG tablet TAKE 1 TABLET BY MOUTH EVERY NIGHT AT BEDTIME (Patient taking differently: Take 40 mg by mouth daily at 6 PM.) 30 tablet 5  ? amoxicillin-clavulanate (AUGMENTIN) 875-125 MG tablet Take 1 tablet by mouth 2 (two) times daily. (Patient not taking: Reported on 12/23/2021) 28 tablet 0  ? ?No current facility-administered medications for this visit.  ? ? ?OBJECTIVE: ?Vitals:  ? 12/23/21 1411  ?BP: 124/74  ?Pulse: 64  ?Temp: 98.6 ?F (37 ?C)  ? ?   Body mass index is 24.22 kg/m?Marland Kitchen    ECOG FS:0 - Asymptomatic ? ?General: Well-developed, well-nourished, no acute distress. ?Eyes: Pink conjunctiva, anicteric sclera. ?HEENT: Normocephalic, moist mucous membranes. ?Lungs: No audible wheezing or coughing. ?Heart: Regular rate and rhythm. ?Abdomen: Soft, nontender, no obvious distention. ?Musculoskeletal: No edema, cyanosis, or clubbing. ?Neuro: Alert, answering all questions appropriately. Cranial nerves grossly intact. ?Skin: No rashes or petechiae noted. ?Psych: Normal affect. ? ?LAB RESULTS: ? ?Lab Results  ?Component Value Date  ? NA 135 12/23/2021  ? K 3.7 12/23/2021  ? CL 102 12/23/2021  ? CO2 26 12/23/2021  ? GLUCOSE 128 (H) 12/23/2021  ? BUN 22 12/23/2021  ? CREATININE 0.93 12/23/2021  ? CALCIUM 8.6 (L) 12/23/2021  ? PROT 7.3 12/23/2021  ? ALBUMIN 3.8 12/23/2021  ? AST 19 12/23/2021  ? ALT 8 12/23/2021  ? ALKPHOS 78 12/23/2021  ? BILITOT 0.7 12/23/2021  ? GFRNONAA >60 12/23/2021  ? GFRAA >60 06/23/2020  ? ? ?Lab Results  ?Component Value Date  ? WBC 6.4 12/23/2021  ? NEUTROABS 3.7 12/23/2021  ? HGB 12.4 (L) 12/23/2021  ? HCT 37.8 (L) 12/23/2021  ? MCV 96.7 12/23/2021  ? PLT 275 12/23/2021  ? ? ? ?STUDIES: ?No results found. ? ?ASSESSMENT: Stage IV prostate cancer. ? ?PLAN:    ? ?1.  Stage IV prostate cancer: Upon diagnosis in July 2021, patient's PSA was greater than 2300.  After initiation of treatment, his PSA decreased to 47.1 in May 2022, but then he did not receive any treatment until recently.  Since reinitiating Eligard in November 2022, patient's PSA has decreased from 525-244.  Proceed with Zometa today.  Return to clinic in 3 months with repeat laboratory work, further evaluation, and consideration of Zometa and Eligard if needed.  Continue follow-up with urology as scheduled.   ? ?I spent a total of 30 minutes reviewing chart data, face-to-face evaluation with the patient, counseling and coordination of care as detailed above. ? ?Patient  expressed understanding and was in agreement with this plan. He also understands that He can call clinic at any time with any questions, concerns, or complaints.  ? ? Cancer Staging  ?PROSTATE CANCER ?Staging form: Prostate, AJCC 7th Edition ?- Clinical: Stage IV (TX, NX, M1b, PSA: 20 or greater) - Signed by Jason Huger, MD on 08/15/2020 ? ? ?Jason Huger, MD   12/23/2021 3:16 PM ? ? ? ? ?

## 2021-12-27 ENCOUNTER — Telehealth: Payer: Self-pay | Admitting: Emergency Medicine

## 2021-12-27 NOTE — Telephone Encounter (Signed)
Pt was informed of Dr. Gary Fleet recommendation.  ?

## 2021-12-27 NOTE — Telephone Encounter (Signed)
-----   Message from Lloyd Huger, MD sent at 12/27/2021  2:05 PM EDT ----- ?I think that is a rather drastic move and not recommended.  The Eligard he receives every 6 months effectively medically castrates him therefore surgical castration is unnecessary. ? ?----- Message ----- ?From: Dayna Geurts S, RN ?Sent: 12/27/2021   1:49 PM EDT ?To: Lloyd Huger, MD ? ?Pt called and message was relayed to me. Pt requesting his "balls to be cut off." When I called back to clarify pt request, he again stated he wanted his testicles removed because he has prostate cancer. Pt states this procedure was completed on his father 30-40 years ago and "it added several years to his life." ? ? ?

## 2022-01-02 IMAGING — DX DG SHOULDER 2+V*L*
3 series · 3 of 3 positions shown · non-contrast
Comparison: Three single-view of the chest 10/08/2020.

CLINICAL DATA: Left shoulder pain since a fall 4 weeks ago. History
of prostate cancer. Initial encounter.

EXAM:
LEFT SHOULDER - 2+ VIEW

[shoulder axial]
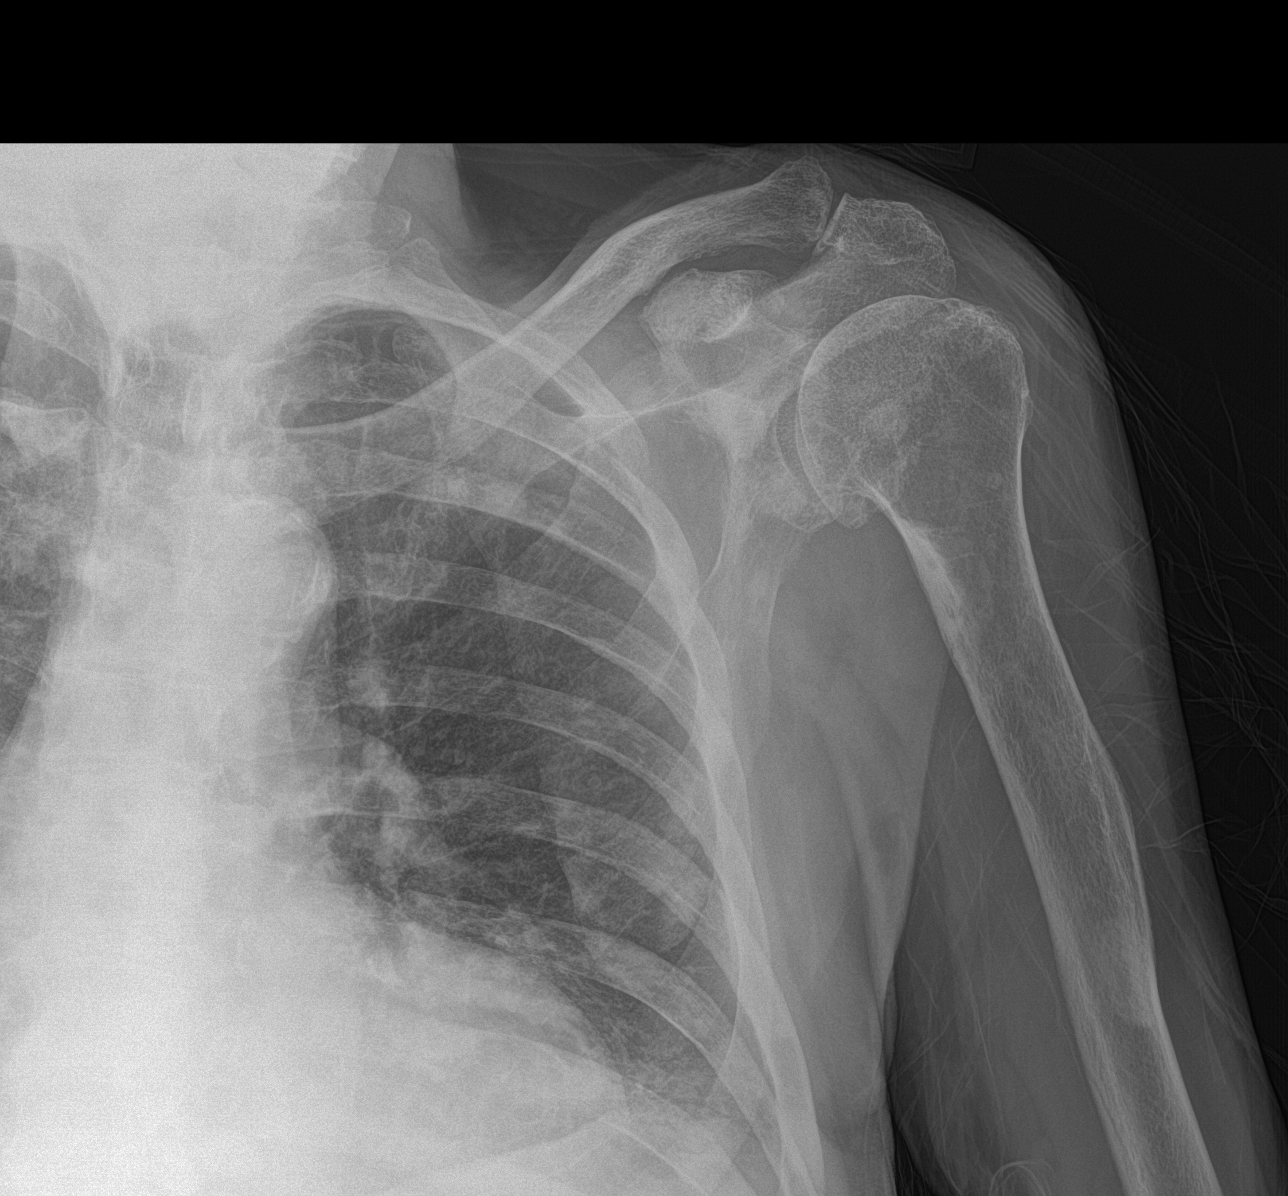

[shoulder ap]
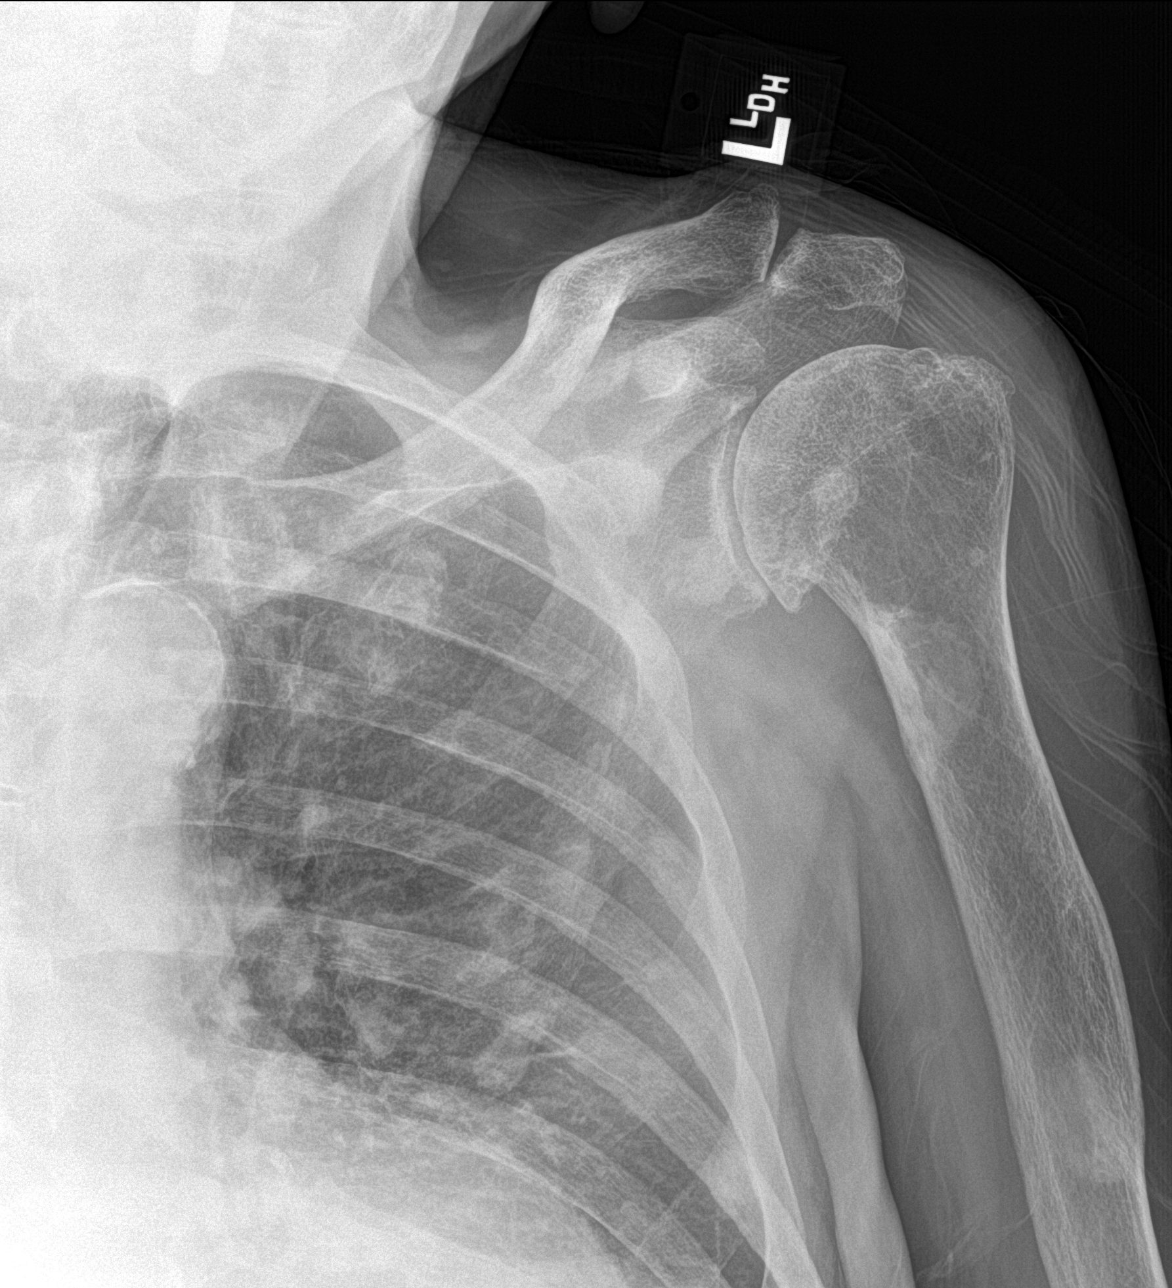

[shoulder obl]
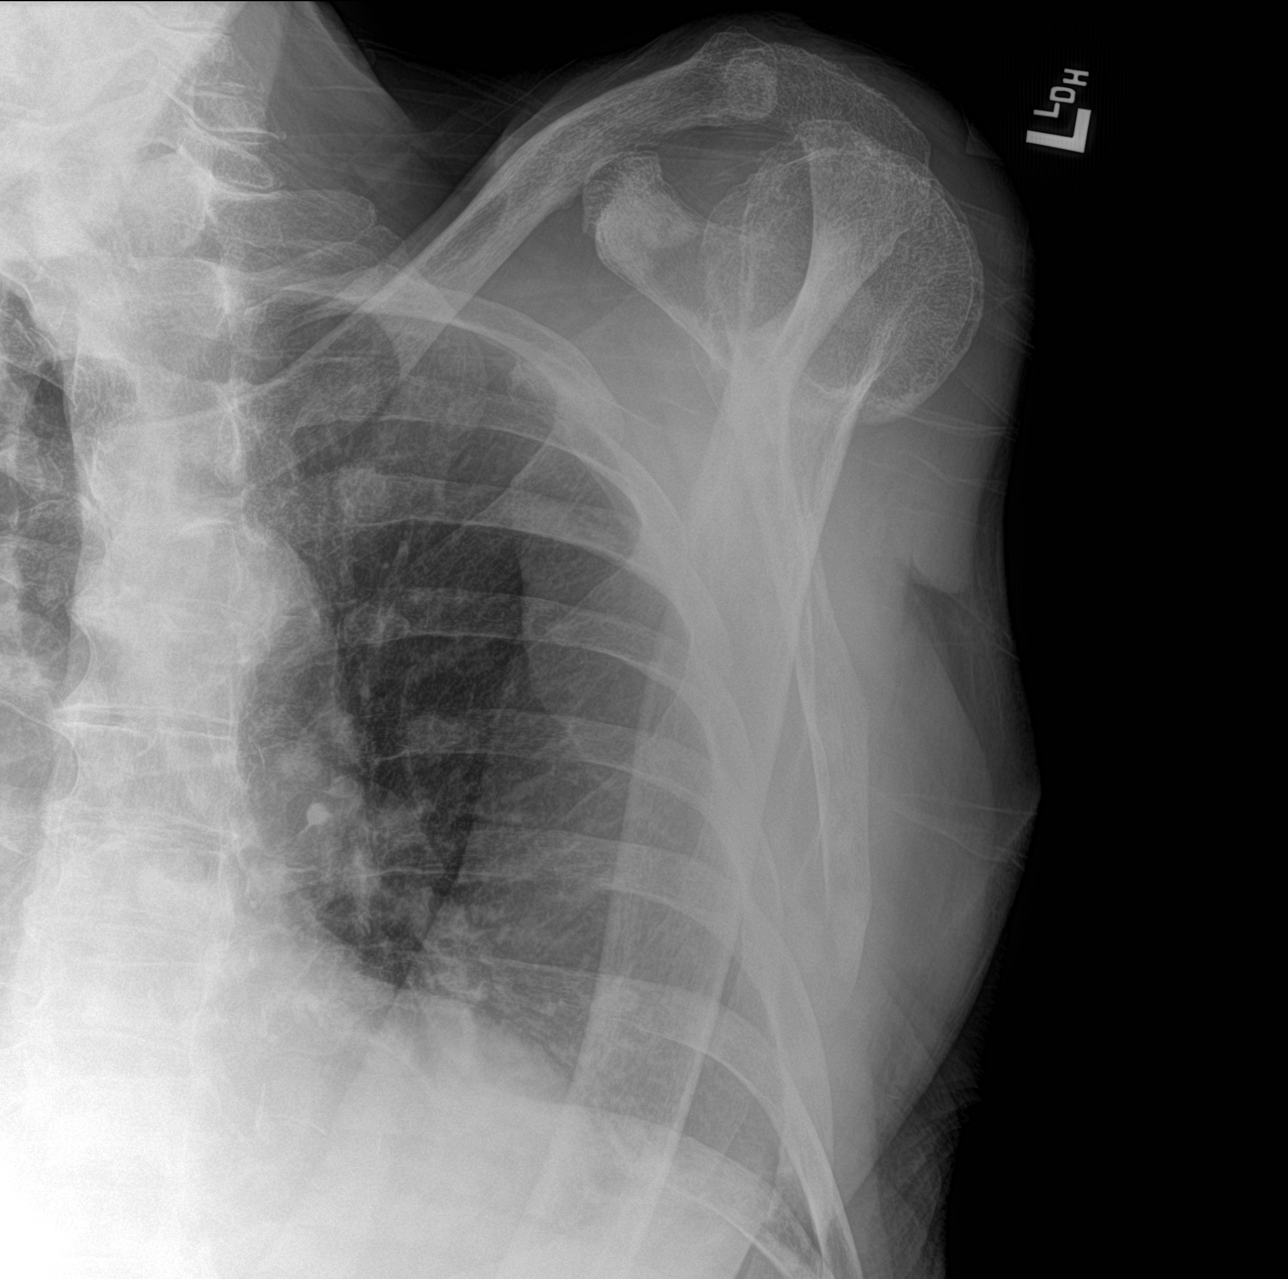

[3 of 3 positions shown; findings below may reference images not displayed]

FINDINGS: There is no acute bony or joint abnormality. The patient has
advanced glenohumeral osteoarthritis and moderate acromioclavicular
osteoarthritis. Scattered sclerotic lesions in all imaged bones
consistent with metastatic prostate cancer noted.
IMPRESSION: No acute abnormality.

Glenohumeral acromioclavicular osteoarthritis.

Metastatic prostate cancer.

## 2022-02-21 ENCOUNTER — Emergency Department: Payer: Medicare PPO

## 2022-02-21 ENCOUNTER — Emergency Department
Admission: EM | Admit: 2022-02-21 | Discharge: 2022-02-21 | Disposition: A | Discharge status: Home / Self Care | Payer: Medicare PPO | Attending: Emergency Medicine | Admitting: Emergency Medicine

## 2022-02-21 ENCOUNTER — Other Ambulatory Visit: Payer: Self-pay

## 2022-02-21 DIAGNOSIS — E039 Hypothyroidism, unspecified: Secondary | ICD-10-CM | POA: Insufficient documentation

## 2022-02-21 DIAGNOSIS — Z7982 Long term (current) use of aspirin: Secondary | ICD-10-CM | POA: Diagnosis not present

## 2022-02-21 DIAGNOSIS — Z955 Presence of coronary angioplasty implant and graft: Secondary | ICD-10-CM | POA: Diagnosis not present

## 2022-02-21 DIAGNOSIS — M542 Cervicalgia: Secondary | ICD-10-CM | POA: Diagnosis not present

## 2022-02-21 DIAGNOSIS — I10 Essential (primary) hypertension: Secondary | ICD-10-CM | POA: Insufficient documentation

## 2022-02-21 DIAGNOSIS — I4891 Unspecified atrial fibrillation: Secondary | ICD-10-CM | POA: Diagnosis not present

## 2022-02-21 DIAGNOSIS — C61 Malignant neoplasm of prostate: Secondary | ICD-10-CM | POA: Insufficient documentation

## 2022-02-21 DIAGNOSIS — G4733 Obstructive sleep apnea (adult) (pediatric): Secondary | ICD-10-CM | POA: Insufficient documentation

## 2022-02-21 DIAGNOSIS — G3184 Mild cognitive impairment, so stated: Secondary | ICD-10-CM | POA: Insufficient documentation

## 2022-02-21 DIAGNOSIS — E785 Hyperlipidemia, unspecified: Secondary | ICD-10-CM | POA: Insufficient documentation

## 2022-02-21 DIAGNOSIS — I2510 Atherosclerotic heart disease of native coronary artery without angina pectoris: Secondary | ICD-10-CM | POA: Diagnosis not present

## 2022-02-21 DIAGNOSIS — Z79899 Other long term (current) drug therapy: Secondary | ICD-10-CM | POA: Insufficient documentation

## 2022-02-21 DIAGNOSIS — R001 Bradycardia, unspecified: Secondary | ICD-10-CM | POA: Insufficient documentation

## 2022-02-21 DIAGNOSIS — G459 Transient cerebral ischemic attack, unspecified: Secondary | ICD-10-CM

## 2022-02-21 DIAGNOSIS — R42 Dizziness and giddiness: Secondary | ICD-10-CM | POA: Diagnosis present

## 2022-02-21 LAB — CBC WITH DIFFERENTIAL/PLATELET
Abs Immature Granulocytes: 0.02 10*3/uL (ref 0.00–0.07)
Basophils Absolute: 0 10*3/uL (ref 0.0–0.1)
Basophils Relative: 1 %
Eosinophils Absolute: 0.1 10*3/uL (ref 0.0–0.5)
Eosinophils Relative: 1 %
HCT: 36.2 % — ABNORMAL LOW (ref 39.0–52.0)
Hemoglobin: 11.6 g/dL — ABNORMAL LOW (ref 13.0–17.0)
Immature Granulocytes: 0 %
Lymphocytes Relative: 21 %
Lymphs Abs: 1.4 10*3/uL (ref 0.7–4.0)
MCH: 31.3 pg (ref 26.0–34.0)
MCHC: 32 g/dL (ref 30.0–36.0)
MCV: 97.6 fL (ref 80.0–100.0)
Monocytes Absolute: 0.6 10*3/uL (ref 0.1–1.0)
Monocytes Relative: 8 %
Neutro Abs: 4.6 10*3/uL (ref 1.7–7.7)
Neutrophils Relative %: 69 %
Platelets: 247 10*3/uL (ref 150–400)
RBC: 3.71 MIL/uL — ABNORMAL LOW (ref 4.22–5.81)
RDW: 13.9 % (ref 11.5–15.5)
WBC: 6.7 10*3/uL (ref 4.0–10.5)
nRBC: 0 % (ref 0.0–0.2)

## 2022-02-21 LAB — HEMOGLOBIN A1C
Hgb A1c MFr Bld: 5.7 % — ABNORMAL HIGH (ref 4.8–5.6)
Mean Plasma Glucose: 116.89 mg/dL

## 2022-02-21 LAB — COMPREHENSIVE METABOLIC PANEL
ALT: <5 U/L (ref 0–44)
AST: 23 U/L (ref 15–41)
Albumin: 3.6 g/dL (ref 3.5–5.0)
Alkaline Phosphatase: 57 U/L (ref 38–126)
Anion gap: 9 (ref 5–15)
BUN: 24 mg/dL — ABNORMAL HIGH (ref 8–23)
CO2: 24 mmol/L (ref 22–32)
Calcium: 8.6 mg/dL — ABNORMAL LOW (ref 8.9–10.3)
Chloride: 104 mmol/L (ref 98–111)
Creatinine, Ser: 1.03 mg/dL (ref 0.61–1.24)
GFR, Estimated: >60 mL/min (ref >60)
Glucose, Bld: 153 mg/dL — ABNORMAL HIGH (ref 70–99)
Potassium: 4.3 mmol/L (ref 3.5–5.1)
Sodium: 137 mmol/L (ref 135–145)
Total Bilirubin: 1.2 mg/dL (ref 0.3–1.2)
Total Protein: 6.9 g/dL (ref 6.5–8.1)

## 2022-02-21 LAB — LIPID PANEL
Cholesterol: 178 mg/dL (ref 0–200)
HDL: 42 mg/dL (ref >40)
LDL Cholesterol: 104 mg/dL — ABNORMAL HIGH (ref 0–99)
Total CHOL/HDL Ratio: 4.2 RATIO
Triglycerides: 159 mg/dL — ABNORMAL HIGH (ref <150)
VLDL: 32 mg/dL (ref 0–40)

## 2022-02-21 LAB — TROPONIN I (HIGH SENSITIVITY): Troponin I (High Sensitivity): 6 ng/L (ref <18)

## 2022-02-21 MED ORDER — CLOPIDOGREL BISULFATE 75 MG PO TABS: 300.0000 mg | ORAL_TABLET | Freq: Once | ORAL | Status: DC

## 2022-02-21 MED ORDER — CLOPIDOGREL BISULFATE 75 MG PO TABS
75.0000 mg | ORAL_TABLET | Freq: Every day | ORAL | Status: DC
Start: 2022-02-22 — End: 2022-02-21

## 2022-02-21 NOTE — ED Provider Notes (Signed)
? ?[facility] ?Provider Note ? ? ? Event Date/Time  ? First MD Initiated Contact with Patient 02/21/22 [phone]   ?  (approximate) ? ? ?History  ? ?Dizziness ? ? ?HPI ? ?Jason Murphy is a 88 y.o. male patient reports she has occasional episodes of vertigo but today had 1 that seemed like it lasted a little longer and came on with some sort of neck discomfort that he cannot really remember and with sweating.  He is never had any of that before and he got worried and came in.  His symptoms are completely gone now.  He denied any shortness of breath or chest pain.  He did not have any nausea or vomiting. ? ?  ? ? ?Physical Exam  ? ?Triage Vital Signs: ?ED Triage Vitals  ?Enc Vitals Group  ?   BP 02/21/22 1002 (!) 148/77  ?   Pulse Rate 02/21/22 1002 (!) 55  ?   Resp 02/21/22 1002 18  ?   Temp 02/21/22 1002 97.7 ?F (36.5 ?C)  ?   Temp Source 02/21/22 1002 Oral  ?   SpO2 02/21/22 1002 99 %  ?   Weight 02/21/22 1003 154 lb (69.9 kg)  ?   Height 02/21/22 1003 '5\' 10"'$  (1.778 m)  ?   Head Circumference --   ?   Peak Flow --   ?   Pain Score 02/21/22 1003 0  ?   Pain Loc --   ?   Pain Edu? --   ?   Excl. in [place]? --   ? ? ?Most recent vital signs: ?Vitals:  ? 02/21/22 1400 02/21/22 1430  ?BP: (!) 139/57 (!) 145/66  ?Pulse: 61 (!) 39  ?Resp:  17  ?Temp:    ?SpO2: 94% 100%  ? ? ? ?General: Awake, no distress.  ?Eyes pupils equal round reactive extraocular movements intact I can see a lens replacement in at least the right eye.  Not sure about the left ?CV:  Good peripheral perfusion.  Heart regular rate and rhythm no audible murmurs ?Resp:  Normal effort.  Lungs are clear ?Abd:  No distention.  Soft nontender no organomegaly palpable ?Extremities with slight trace edema ?Neuro exam cranial nerves II through XII are intact cerebellar finger-to-nose is normal motor strength is 5/5 throughout and patient does not report any numbness ? ? ? ?ED Results / Procedures / Treatments  ? ?Labs ?(all labs ordered are  listed, but only abnormal results are displayed) ?Labs Reviewed  ?COMPREHENSIVE METABOLIC PANEL - Abnormal; Notable for the following components:  ?    Result Value  ? Glucose, Bld 153 (*)   ? BUN 24 (*)   ? Calcium 8.6 (*)   ? All other components within normal limits  ?CBC WITH DIFFERENTIAL/PLATELET - Abnormal; Notable for the following components:  ? RBC 3.71 (*)   ? Hemoglobin 11.6 (*)   ? HCT 36.2 (*)   ? All other components within normal limits  ?LIPID PANEL - Abnormal; Notable for the following components:  ? Triglycerides 159 (*)   ? LDL Cholesterol 104 (*)   ? All other components within normal limits  ?HEMOGLOBIN A1C  ?TROPONIN I (HIGH SENSITIVITY)  ?TROPONIN I (HIGH SENSITIVITY)  ? ? ? ?EKG ? ?EKG read and interpreted by me shows sinus bradycardia rate of 53 slight T wave inversion in 1 and more in L slight ST elevation in V3 and 4 but this is similar to prior EKG ? ? ?  RADIOLOGY ?CT head read by radiology films reviewed by me does not show any acute problems ? ? ?PROCEDURES: ? ?Critical Care performed:  ? ?Procedures I can find no cardiac etiology.  I was worried about a TIA for this gentleman and in fact consulted neurology.  Patient was seen by neurology and his stepdaughter tried to talk him into staying but he would not stay even realizing and understanding that this could lead to a bigger stroke coming on very soon he wanted to go home and make sure he could go on his trip to [place].  He is completely aware of the results or possible results of his decision i.e. worsening stroke and incapacitation but he is fully competent and 88 years old so I will let him go as he wishes. ? ? ?MEDICATIONS ORDERED IN ED: ?Medications  ?clopidogrel (PLAVIX) tablet 300 mg (300 mg Oral Not Given 02/21/22 1432)  ?  Followed by  ?clopidogrel (PLAVIX) tablet 75 mg (has no administration in time range)  ? ? ? ?IMPRESSION / MDM / ASSESSMENT AND PLAN / ED COURSE  ?I reviewed the triage vital signs and the nursing  notes. ?Possible etiologies for his dizziness are just a worsened episode of vertigo or TIA or arrhythmia or cardiac ischemia or hypotension from bleeding or infection.  Most of these causes have been ruled out.  But not the stroke specifically. ?The patient is on the cardiac monitor to evaluate for evidence of arrhythmia and/or significant heart rate changes.  None have been seen ? ?  ? ? ?FINAL CLINICAL IMPRESSION(S) / ED DIAGNOSES  ? ?Final diagnoses:  ?Vertigo  ? ? ? ?Rx / DC Orders  ? ?ED Discharge Orders   ? ? None  ? ?  ? ? ? ?Note:  This document was prepared using Dragon voice recognition software and may include unintentional dictation errors. ?  ?Nena Polio, MD ?02/21/22 1625 ? ?

## 2022-02-21 NOTE — Consult Note (Signed)
Neurology Consultation ?Reason for Consult: Dizziness ?Requesting Physician: Conni Slipper ? ?CC: Dizziness ? ?History is obtained from: Patient, chart review; unable to reach family at numbers in the chart  ? ?HPI: Jason Murphy is a 88 y.o. 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 ? ?He reports that he was eating breakfast today when he suddenly felt like his head was "wild" and he felt "out of it" and laid his head down on the table.  The facility where he lives independently called EMS for transportation and he continued to feel out of it until about 11 AM.  At this time he feels that his symptoms have fully resolved and he is eager to leave, understanding the risks of doing so and reporting that his age he is unconcerned about the risks.  He does report he had a similar episode 5 to 6 months ago, but does not recall the extensive history of dizziness as documented in his chart ? ?Of note, he was last evaluated for dizziness by neurology in April 2016 (Dr. Erlinda Hong), and at that time his dizziness has been ongoing for greater than 30 years and occurring 15 times per month present in all positions associated with nausea, occasional vomiting, worsened by exercise, not associated with headaches.  Tilt table testing in 2002 showed cardiogenic syncope and evaluation at [facility] in January 2006 showed normal blood studies, audiogram with age-related hearing loss, and video ENG with no evidence of BPPV.  He was followed subsequently by Dr. Erling Cruz, who additionally tried Depakote for question of migraine.  EEG was also normal.  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  He came in for  evaluation on 12/30/2014 due to an atypical episode (did not resolve in 3 to 4 hours after lying down and instead persisted to a second day, where an he was feeling not that dizzy but having a head heaviness, not being able to focus, not thinking well).  This was related to some financial stress potentially ? ?Prostate cancer last office visit 12/23/21: "1.  Stage IV prostate cancer: Upon diagnosis in July 2021, patient's PSA was greater than 2300.  After initiation of treatment, his PSA decreased to 47.1 in May 2022, but then he did not receive any treatment until recently.  Since reinitiating Eligard in November 2022, patient's PSA has decreased from 525-244.  Proceed with Zometa today.  Return to clinic in 3 months with repeat laboratory work, further evaluation, and consideration of Zometa and Eligard if needed.  Continue follow-up with urology as scheduled.  " ? ?Regarding his atrial fibrillation history, this is also on his chart and in cardiology notes, but it is unclear whether this was a simple transient self-limited episode that has not recurred.  He is only on aspirin monotherapy and not on anticoagulation ? ?LKW:  ?tPA given?: No, symptoms resolved  ?Premorbid modified rankin scale:  ?    1 - No significant disability. Able to carry out all usual activities, despite some symptoms. ? ? ?ROS: All other review of systems was negative except as noted in the HPI.  ? ?Past Medical History:  ?Diagnosis Date  ? Cancer [facility])   ? prostate  ? Coronary artery disease   ? Dizziness   ?  chronic dizziness  ? Dyspnea   ? Fatigue   ? Progressive fatigue with question of sleep apnea  ? Hypercholesterolemia   ? Hypertension   ? Obstructive sleep apnea   ? Pneumonia   ? Restless leg syndrome   ? ?Past Surgical History:  ?Procedure Laterality Date  ? APPENDECTOMY    ? CARDIAC CATHETERIZATION    ? Ejection Fraction was 60%, stent  ? KNEE SURGERY    ? arthroscopic left knee surgery  ? SHOULDER ARTHROSCOPY    ? ?Current Outpatient  Medications  ?Medication Instructions  ? amoxicillin-clavulanate (AUGMENTIN) 875-125 MG tablet 1 tablet, Oral, 2 times daily  ? aspirin EC 81 mg, Oral, Daily at bedtime  ? ciprofloxacin (CIPRO) 500 mg, Oral, Every 12 hours  ? co-enzyme Q-10 100 mg, Oral, BH-each morning,    ? enzalutamide (XTANDI) 160 mg, Oral, Daily  ? feeding supplement (ENSURE ENLIVE / ENSURE PLUS) LIQD 237 mLs, Oral, 3 times daily between meals  ? finasteride (PROSCAR) 5 mg, Oral, Daily  ? isosorbide mononitrate (IMDUR) 30 mg, Oral, Daily  ? levothyroxine (SYNTHROID) 50 mcg, Oral, Daily before breakfast  ? meclizine (ANTIVERT) 25 mg, Oral, 2 times daily PRN  ? Multiple Vitamins-Minerals (PRESERVISION AREDS 2 PO) 1 tablet, Oral, Daily  ? nitroGLYCERIN (NITROSTAT) 0.4 mg, Sublingual, Every 5 min PRN  ? omeprazole (PRILOSEC) 20 mg, Oral, Daily  ? rOPINIRole (REQUIP) 1 mg, Oral, 3 times daily  ? simvastatin (ZOCOR) 40 MG tablet TAKE 1 TABLET BY MOUTH EVERY NIGHT AT BEDTIME  ? vitamin B-12 (CYANOCOBALAMIN) 500 mcg, Oral, BH-each morning  ? Vitamin D3 1,000 Units, Oral, Daily  ? ? ? ?Family History  ?Problem Relation Age of Onset  ? COPD Mother   ? ? ? ?Social History:  reports that he has never smoked. He has never used smokeless tobacco. He reports current alcohol use. He reports that he does not use drugs. ? ?Exam: ?Current vital signs: ?BP (!) 148/77 (BP Location: Right Arm)   Pulse (!) 55   Temp 97.7 ?F (36.5 ?C) (Oral)   Resp 18   Ht '5\' 10"'$  (1.778 m)   Wt 69.9 kg   SpO2 99%   BMI 22.10 kg/m?  ?Vital signs in last 24 hours: ?Temp:  [97.7 ?F (36.5 ?C)] 97.7 ?F (36.5 ?C) (05/08 1002) ?Pulse Rate:  [55] 55 (05/08 1002) ?Resp:  [18] 18 (05/08 1002) ?BP: (148)/(77) 148/77 (05/08 1002) ?SpO2:  [99 %] 99 % (05/08 1002) ?Weight:  [69.9 kg] 69.9 kg (05/08 1003) ? ? ?Physical Exam  ?Constitutional: Appears well-developed and well-nourished.  ?Psych: Affect appropriate to situation, calm and cooperative ?Eyes: No scleral injection ?HENT: No  oropharyngeal obstruction.  ?MSK: no joint deformities.  ?Cardiovascular: Normal rate and regular rhythm. Perfusing extremities well ?Respiratory: Effort normal, non-labored breathing ?GI: Soft.  No distension. There is no tenderness.  ?Skin: Scratches on the left lower extremity and a skin tear on the right upper extremity.  He is unable to explain the scratches on the leg, but reports he frequently hits his bureau with his right arm ? ?Neuro: ?Mental Status: ?Patient is awake, alert, oriented to person, place, month, year, and situation. ?Patient is able to give a clear and coherent history for recent events but does not recall much of the history in his chart such as cardiac stent or history of basilar migraines without headache ?No signs of aphasia or neglect ?Cranial Nerves: ?II: Visual Fields are full. Pupils are equal, round, and reactive to light.   ?  III,IV, VI: EOMI without ptosis or diploplia.  He does report diplopia when looking to the left when driving (only distant objects or double) ?V: Facial sensation is symmetric to light touch ?VII: Facial movement is symmetric.  ?VIII: hearing is intact to voice ?X: Uvula elevates symmetrically ?XI: Shoulder shrug is symmetric. ?XII: tongue is midline without atrophy or fasciculations.  ?Motor: ?Tone is normal. Bulk is normal. 5/5 strength was present in all four extremities.  ?Sensory: ?Sensation is symmetric to light touch in the arms and legs. ?Cerebellar: ?FNF and HKS are intact bilaterally ?Gait:  ?Stooped kyphotic posture, shuffling wide-based gait, baseline per patient (uses a walker at baseline), able to rise on his toes but not his heels.  Unable to tandem ? ?NIHSS total 0 ?Performed at 1:45 PM  ? ? ?I have reviewed labs in epic and the results pertinent to this consultation are: ? ?Basic Metabolic Panel: ?Recent Labs  ?Lab 02/21/22 ?1008  ?NA 137  ?K 4.3  ?CL 104  ?CO2 24  ?GLUCOSE 153*  ?BUN 24*  ?CREATININE 1.03  ?CALCIUM 8.6*  ? ? ?CBC: ?Recent Labs   ?Lab 02/21/22 ?1008  ?WBC 6.7  ?NEUTROABS 4.6  ?HGB 11.6*  ?HCT 36.2*  ?MCV 97.6  ?PLT 247  ? ? ?Coagulation Studies: ?No results for input(s): LABPROT, INR in the last 72 hours.  ? ? ?No results found for: HGBA1C ? ?L

## 2022-02-21 NOTE — Discharge Instructions (Signed)
Is possible that this vertigo may have been a mini stroke or transient ischemic attack.  I am worried that she might have a worse stroke if you go home now but since you do not want to stay I will let you go.  Please make sure you continuing to take your baby aspirin every day.  Please return if you have any worsening.  Follow-up with your regular doctor. ?

## 2022-02-21 NOTE — ED Triage Notes (Signed)
Pt here via [facility] with dizziness. Pt has hx of dizziness but now with neck pain. Pt has hx of prostate CA. Pt took 4 baby aspirin. 12 lead done by ems showed LBB. Pt stable on arrival to ED. ?

## 2022-02-21 NOTE — Consult Note (Signed)
?History and Physical  ? ? ?Jason Murphy YQM:[id number] DOB: [birth year] DOA: 02/21/2022 ? ?PCP: Baxter Hire, MD  ?Patient coming from: assisted living ? ? ?Chief Complaint: lightheadedness ? ?HPI: Jason Murphy is a 88 y.o. male with medical history significant for a-fib, metastatic prostate cancer, osa not on cpap, cad, hypothyroid, hypertension, who presents with the above. ? ?Reports that during breakfast this morning became lightheaded and slumped over. Unsure whether he lost consciousness. Denies room spinning sensation. Says this hasn't happened in the past. No chest pain or palpitations. No trauma. No reported seizure like activity. Denies focal weakness or numbness or new trouble swallowing or speaking or ambulating. No vision changes or tinnitus. ? ?ED Course:  ? ?ED provider discussed w/ neurology who advised admission for TIA evaluation. ? ?Review of Systems: As per HPI otherwise 10 point review of systems negative.  ? ? ?Past Medical History:  ?Diagnosis Date  ? Cancer [facility])   ? prostate  ? Coronary artery disease   ? Dizziness   ? chronic dizziness  ? Dyspnea   ? Fatigue   ? Progressive fatigue with question of sleep apnea  ? Hypercholesterolemia   ? Hypertension   ? Obstructive sleep apnea   ? Pneumonia   ? Restless leg syndrome   ? ? ?Past Surgical History:  ?Procedure Laterality Date  ? APPENDECTOMY    ? CARDIAC CATHETERIZATION    ? Ejection Fraction was 60%, stent  ? KNEE SURGERY    ? arthroscopic left knee surgery  ? SHOULDER ARTHROSCOPY    ? ? ? reports that he has never smoked. He has never used smokeless tobacco. He reports current alcohol use. He reports that he does not use drugs. ? ?No Known Allergies ? ?Family History  ?Problem Relation Age of Onset  ? COPD Mother   ? ? ?Prior to Admission medications   ?Medication Sig Start Date End Date Taking? Authorizing Provider  ?amoxicillin-clavulanate (AUGMENTIN) 875-125 MG tablet Take 1 tablet by mouth 2 (two) times daily. ?Patient not taking:  Reported on 12/23/2021 11/01/21   Hollice Espy, MD  ?aspirin EC 81 MG tablet Take 81 mg by mouth at bedtime.    [provider]  ?Cholecalciferol (VITAMIN D3) 1000 units CAPS Take 1,000 Units by mouth daily.    [provider]  ?ciprofloxacin (CIPRO) 500 MG tablet Take 1 tablet (500 mg total) by mouth every 12 (twelve) hours. 10/27/21   Hollice Espy, MD  ?co-enzyme Q-10 50 MG capsule Take 100 mg by mouth every morning.    [provider]  ?cyanocobalamin 500 MCG tablet Take 500 mcg by mouth every morning.    [provider]  ?enzalutamide Gillermina Phy) 80 MG tablet Take 2 tablets (160 mg total) by mouth daily. 11/17/21   Hollice Espy, MD  ?feeding supplement (ENSURE ENLIVE / ENSURE PLUS) LIQD Take 237 mLs by mouth 3 (three) times daily between meals. 01/02/21   Ezekiel Slocumb, DO  ?finasteride (PROSCAR) 5 MG tablet Take 5 mg by mouth daily. 05/14/21   [provider]  ?isosorbide mononitrate (IMDUR) 30 MG 24 hr tablet Take 30 mg by mouth daily. 04/09/20   [provider]  ?levothyroxine (SYNTHROID, LEVOTHROID) 50 MCG tablet Take 50 mcg by mouth daily before breakfast.    [provider]  ?meclizine (ANTIVERT) 25 MG tablet Take 25 mg by mouth 2 (two) times daily as needed for dizziness.    [provider]  ?Multiple Vitamins-Minerals (PRESERVISION AREDS 2 PO)  Take 1 tablet by mouth daily.    [provider]  ?nitroGLYCERIN (NITROSTAT) 0.4 MG SL tablet Place 0.4 mg under the tongue every 5 (five) minutes as needed for chest pain.    [provider]  ?omeprazole (PRILOSEC) 20 MG capsule Take 20 mg by mouth daily.    [provider]  ?rOPINIRole (REQUIP) 1 MG tablet Take 1 mg by mouth 3 (three) times daily. 03/10/20   [provider]  ?simvastatin (ZOCOR) 40 MG tablet TAKE 1 TABLET BY MOUTH EVERY NIGHT AT BEDTIME ?Patient taking differently: Take 40 mg by mouth daily at 6 PM. 05/26/11   [place], Peter M, MD   ?pramipexole (MIRAPEX) 0.5 MG tablet Take 0.5 mg by mouth at bedtime. 09/18/17 03/15/20  [provider]  ? ? ?Physical Exam: ?Vitals:  ? 02/21/22 1002 02/21/22 1003  ?BP: (!) 148/77   ?Pulse: (!) 55   ?Resp: 18   ?Temp: 97.7 ?F (36.5 ?C)   ?TempSrc: Oral   ?SpO2: 99%   ?Weight:  69.9 kg  ?Height:  '5\' 10"'$  (1.778 m)  ? ? ?Constitutional: No acute distress ?Head: Atraumatic ?Eyes: Conjunctiva clear ?ENM: Moist mucous membranes. Normal dentition.  ?Neck: Supple ?Respiratory: Clear to auscultation bilaterally, no wheezing/rales/rhonchi. Normal respiratory effort. No accessory muscle use. Marland Kitchen ?Cardiovascular: Regular rate and rhythm. Soft systolic murmur ?Abdomen: Non-tender, non-distended. No masses. No rebound or guarding. Positive bowel sounds. ?Musculoskeletal: No joint deformity upper and lower extremities. Normal ROM, no contractures. Normal muscle tone.  ?Skin: No rashes, lesions, or ulcers.  ?Extremities: trace LE peripheral edema. Palpable peripheral pulses. ?Neurologic: Alert, moving all 4 extremities. Cn2-12 grossly intact. 5/5 upper and lower strength. Distal sensation symmetric ?Psychiatric: Normal insight and judgement. ? ? ?Labs on Admission: I have personally reviewed following labs and imaging studies ? ?CBC: ?Recent Labs  ?Lab 02/21/22 ?1008  ?WBC 6.7  ?NEUTROABS 4.6  ?HGB 11.6*  ?HCT 36.2*  ?MCV 97.6  ?PLT 247  ? ?Basic Metabolic Panel: ?Recent Labs  ?Lab 02/21/22 ?1008  ?NA 137  ?K 4.3  ?CL 104  ?CO2 24  ?GLUCOSE 153*  ?BUN 24*  ?CREATININE 1.03  ?CALCIUM 8.6*  ? ?GFR: ?Estimated Creatinine Clearance: 37.7 mL/min (by C-G formula based on SCr of 1.03 mg/dL). ?Liver Function Tests: ?Recent Labs  ?Lab 02/21/22 ?1008  ?AST 23  ?ALT <5  ?ALKPHOS 57  ?BILITOT 1.2  ?PROT 6.9  ?ALBUMIN 3.6  ? ?No results for input(s): LIPASE, AMYLASE in the last 168 hours. ?No results for input(s): AMMONIA in the last 168 hours. ?Coagulation Profile: ?No results for input(s): INR, PROTIME in the last 168 hours. ?Cardiac  Enzymes: ?No results for input(s): CKTOTAL, CKMB, CKMBINDEX, TROPONINI in the last 168 hours. ?BNP (last 3 results) ?No results for input(s): PROBNP in the last 8760 hours. ?HbA1C: ?No results for input(s): HGBA1C in the last 72 hours. ?CBG: ?No results for input(s): GLUCAP in the last 168 hours. ?Lipid Profile: ?No results for input(s): CHOL, HDL, LDLCALC, TRIG, CHOLHDL, LDLDIRECT in the last 72 hours. ?Thyroid Function Tests: ?No results for input(s): TSH, T4TOTAL, FREET4, T3FREE, THYROIDAB in the last 72 hours. ?Anemia Panel: ?No results for input(s): VITAMINB12, FOLATE, FERRITIN, TIBC, IRON, RETICCTPCT in the last 72 hours. ?Urine analysis: ?   ?Component Value Date/Time  ? COLORURINE YELLOW (A) 02/09/2021 1256  ? APPEARANCEUR Cloudy (A) 10/27/2021 1338  ? LABSPEC 1.038 (H) 02/09/2021 1256  ? LABSPEC 1.015 01/02/2012 2312  ? PHURINE 6.0 02/09/2021 1256  ? GLUCOSEU Negative 10/27/2021 1338  ?  GLUCOSEU Negative 01/02/2012 2312  ? HGBUR NEGATIVE 02/09/2021 1256  ? BILIRUBINUR Negative 10/27/2021 1338  ? BILIRUBINUR Negative 01/02/2012 2312  ? [place] NEGATIVE 02/09/2021 1256  ? PROTEINUR Trace (A) 10/27/2021 1338  ? PROTEINUR NEGATIVE 02/09/2021 1256  ? NITRITE Positive (A) 10/27/2021 1338  ? NITRITE POSITIVE (A) 02/09/2021 1256  ? LEUKOCYTESUR 1+ (A) 10/27/2021 1338  ? LEUKOCYTESUR LARGE (A) 02/09/2021 1256  ? LEUKOCYTESUR Negative 01/02/2012 2312  ? ? ?Radiological Exams on Admission: ?CT Head Wo Contrast ? ?Result Date: 02/21/2022 ?CLINICAL DATA:  Dizziness. EXAM: CT HEAD WITHOUT CONTRAST TECHNIQUE: Contiguous axial images were obtained from the base of the skull through the vertex without intravenous contrast. RADIATION DOSE REDUCTION: This exam was performed according to the departmental dose-optimization program which includes automated exposure control, adjustment of the mA and/or kV according to patient size and/or use of iterative reconstruction technique. COMPARISON:  CT head dated February 09, 2021.  FINDINGS: Brain: No evidence of acute infarction, hemorrhage, hydrocephalus, extra-axial collection or mass lesion/mass effect. Stable atrophy and chronic microvascular ischemic changes. Vascular: Calcified atheroscler

## 2022-02-23 ENCOUNTER — Ambulatory Visit: Payer: Medicare PPO | Admitting: Urology

## 2022-02-28 NOTE — Progress Notes (Incomplete)
? ?02/28/22 ?9:02 PM  ? ?[name] ?1919/10/30 ?[id number] ? ?Referring provider:  ?Baxter Hire, MD ?[place] ?[place],  [place] [address] ?No chief complaint on file. ? ? ? ? ?HPI: ?Jason Murphy is a 88 y.o.male  with a personal history of advanced metastatic prostate cancer on ADT and history of urinary retention previously managed with [facility], who presents today for a 6 month follow-up with PSA.  ? ?His last Eligard injection was in office on 08/25/2021. 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  His most recent PSA on 09/28/2021 was 600.  ? ?His most recent PSA was 131.70 on 12/23/2021.  ?  ? ? ? ? ? ?PMH: ?Past Medical History:  ?Diagnosis Date  ? Cancer [facility])   ? prostate  ? Coronary artery disease   ? Dizziness   ? chronic dizziness  ? Dyspnea   ? Fatigue   ? Progressive fatigue with question of sleep apnea  ? Hypercholesterolemia   ? Hypertension   ? Obstructive sleep apnea   ? Pneumonia   ? Restless leg syndrome   ? ? ?Surgical History: ?Past Surgical History:  ?Procedure Laterality Date  ? APPENDECTOMY    ? CARDIAC CATHETERIZATION    ? Ejection Fraction was 60%, stent  ? KNEE SURGERY    ? arthroscopic left knee surgery  ? SHOULDER ARTHROSCOPY    ? ? ?Home Medications:  ?Allergies as of 03/01/2022   ?No Known Allergies ?  ? ?  ?Medication List  ?  ? ?  ? Accurate as of Feb 28, 2022  9:02 PM. If you have any questions, ask your nurse or doctor.  ?  ?  ? ?  ? ?amoxicillin-clavulanate 875-125 MG tablet ?Commonly known as: AUGMENTIN ?Take 1 tablet by mouth 2 (two) times daily. ?  ?aspirin EC 81 MG tablet ?Take 81 mg by mouth at bedtime. ?  ?ciprofloxacin 500 MG tablet ?Commonly known as: CIPRO ?Take 1 tablet (500 mg total) by mouth every 12 (twelve) hours. ?  ?co-enzyme Q-10 50 MG capsule ?Take 100 mg by mouth every morning. ?  ?enzalutamide 80 MG tablet ?Commonly known as: XTANDI ?Take 2 tablets (160 mg total) by mouth  daily. ?  ?feeding supplement Liqd ?Take 237 mLs by mouth 3 (three) times daily between meals. ?  ?finasteride 5 MG tablet ?Commonly known as: PROSCAR ?Take 5 mg by mouth daily. ?  ?isosorbide mononitrate 30 MG 24 hr tablet ?Commonly known as: IMDUR ?Take 30 mg by mouth daily. ?  ?levothyroxine 50 MCG tablet ?Commonly known as: SYNTHROID ?Take 50 mcg by mouth daily before breakfast. ?  ?meclizine 25 MG tablet ?Commonly known as: ANTIVERT ?Take 25 mg by mouth 2 (two) times daily as needed for dizziness. ?  ?nitroGLYCERIN 0.4 MG SL tablet ?Commonly known as: NITROSTAT ?Place 0.4 mg under the tongue every 5 (five) minutes as needed for chest pain. ?  ?omeprazole 20 MG capsule ?Commonly known as: PRILOSEC ?Take 20 mg by mouth daily. ?  ?PRESERVISION AREDS 2 PO ?Take 1 tablet by mouth daily. ?  ?rOPINIRole 1 MG tablet ?Commonly known as: REQUIP ?Take 1 mg by mouth 3 (three) times daily. ?  ?simvastatin 40 MG tablet ?Commonly known as: ZOCOR ?TAKE 1 TABLET BY MOUTH EVERY NIGHT AT BEDTIME ?What changed: when to take this ?  ?vitamin B-12 500 MCG tablet ?Commonly known as: CYANOCOBALAMIN ?Take 500 mcg by mouth every morning. ?  ?  Vitamin D3 25 MCG (1000 UT) Caps ?Take 1,000 Units by mouth daily. ?  ? ?  ? ? ?Allergies:  ?No Known Allergies ? ?Family History: ?Family History  ?Problem Relation Age of Onset  ? COPD Mother   ? ? ?Social History:  reports that he has never smoked. He has never used smokeless tobacco. He reports current alcohol use. He reports that he does not use drugs. ? ? ?Physical Exam: ?There were no vitals taken for this visit.  ?Constitutional:  Alert and oriented, No acute distress. ?HEENT: [place] AT, moist mucus membranes.  Trachea midline, no masses. ?Cardiovascular: No clubbing, cyanosis, or edema. ?Respiratory: Normal respiratory effort, no increased work of breathing. ?Skin: No rashes, bruises or suspicious lesions. ?Neurologic: Grossly intact, no focal deficits, moving all 4 extremities. ?Psychiatric:  Normal mood and affect. ? ?Laboratory Data: ? ?Lab Results  ?Component Value Date  ? CREATININE 1.03 02/21/2022  ? ?Lab Results  ?Component Value Date  ? HGBA1C 5.7 (H) 02/21/2022  ? ? ?Urinalysis ? ? ?Pertinent Imaging: ? ? ? ?Assessment & Plan:   ? ? ?No follow-ups on file. ? ?I,Kailey Littlejohn,acting as a scribe for Hollice Espy, MD.,have documented all relevant documentation on the behalf of Hollice Espy, MD,as directed by  Hollice Espy, MD while in the presence of Hollice Espy, MD. ? ? ?[place] ?[address], [address] ?[place], [place] [address] ?[phone] ?

## 2022-03-01 ENCOUNTER — Ambulatory Visit: Payer: Medicare PPO | Admitting: Urology

## 2022-03-08 ENCOUNTER — Ambulatory Visit: Payer: Medicare PPO | Admitting: Urology

## 2022-03-08 ENCOUNTER — Encounter: Payer: Self-pay | Admitting: Urology

## 2022-03-08 VITALS — BP 138/65 | HR 71 | Ht 68.0 in | Wt 161.0 lb

## 2022-03-08 DIAGNOSIS — C61 Malignant neoplasm of prostate: Secondary | ICD-10-CM

## 2022-03-08 DIAGNOSIS — R300 Dysuria: Secondary | ICD-10-CM | POA: Diagnosis not present

## 2022-03-08 MED ORDER — LEUPROLIDE ACETATE (6 MONTH) 45 MG ~~LOC~~ KIT
45.0000 mg | PACK | Freq: Once | SUBCUTANEOUS | Status: AC
  Administered 2022-03-08: 45 mg via SUBCUTANEOUS

## 2022-03-08 NOTE — Progress Notes (Unsigned)
Eligard SubQ Injection   Due to Prostate Cancer patient is present today for a Eligard Injection.  Medication: Eligard 6 month Dose: 45 mg  Location: left subcutaneous buttocls Lot: [id number] Exp: 08/2023  Patient tolerated well, no complications were noted  Performed by: Verlene Mayer, [place]  Per Dr. Erlene Quan patient is to continue therapy for 6 months . Patient's next follow up was scheduled for 09/13/22. This appointment was scheduled using wheel and given to patient today along with reminder continue on Vitamin D 800-1000iu and Calcium 1000-'1200mg'$  daily while on Androgen Deprivation Therapy.

## 2022-03-08 NOTE — Progress Notes (Signed)
03/08/2022 2:46 PM   Jason Murphy Aug 16, 1920 [id number]  Referring provider:  Baxter Hire, MD [place],  Jason Murphy [id number] Chief Complaint  Patient presents with   Prostate Cancer    [address]/PSA      HPI: Jason COBLEis a 88y.o.male  with a personal history of advanced metastatic prostate cancer on ADT and history of urinary retention previously managed with [facility], who presents today for a 6 month follow-up.   His last Eligard injection was in office on 08/25/2021. 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  His most recent PSA 131.70 on 12/23/2021.   He is doing well today. He reports that during the nighttime between the time he goes to sleep and 2 hours later he gets up to urinate 6x.    PSA trend:  Component Prostate Specific Ag, Serum  Latest Ref Rng & Units 0.0 - 4.0 ng/mL  04/29/2020 2,355.0 (H)  08/03/2020 265.0 (H)  02/17/2021 47.1 (H)  08/25/2021 457.0 (H)  09/08/2021 525.0 (H)  09/28/2021 600.0 (H)  12/23/2021 131.70 (H)        PMH: Past Medical History:  Diagnosis Date   Cancer ([facility]    prostate   Coronary artery disease    Dizziness    chronic dizziness   Dyspnea    Fatigue    Progressive fatigue with question of sleep apnea   Hypercholesterolemia    Hypertension    Obstructive sleep apnea    Pneumonia    Restless leg syndrome     Surgical History: Past Surgical History:  Procedure Laterality Date   APPENDECTOMY     CARDIAC CATHETERIZATION     Ejection Fraction was 60%, stent   KNEE SURGERY     arthroscopic left knee surgery   SHOULDER ARTHROSCOPY      Home Medications:  Allergies as of 03/08/2022   No Known Allergies      Medication List        Accurate as of Mar 08, 2022  2:46 PM. If you have any questions, ask your nurse or doctor.          STOP taking these medications    amoxicillin-clavulanate 875-125 MG tablet Commonly  known as: AUGMENTIN Stopped by: AHollice Espy MD   ciprofloxacin 500 MG tablet Commonly known as: CIPRO Stopped by: AHollice Espy MD       TAKE these medications    aspirin EC 81 MG tablet Take 81 mg by mouth at bedtime.   co-enzyme Q-10 50 MG capsule Take 100 mg by mouth every morning.   enzalutamide 80 MG tablet Commonly known as: XTANDI Take 2 tablets (160 mg total) by mouth daily.   feeding supplement Liqd Take 237 mLs by mouth 3 (three) times daily between meals.   finasteride 5 MG tablet Commonly known as: PROSCAR Take 5 mg by mouth daily.   isosorbide mononitrate 30 MG 24 hr tablet Commonly known as: IMDUR Take 30 mg by mouth daily.   levothyroxine 50 MCG tablet Commonly known as: SYNTHROID Take 50 mcg by mouth daily before breakfast.   meclizine 25 MG tablet Commonly known as: ANTIVERT Take 25 mg by mouth 2 (two) times daily as needed for dizziness.   nitroGLYCERIN 0.4 MG SL tablet Commonly known as: NITROSTAT Place 0.4 mg under the tongue every 5 (five) minutes as needed for chest pain.   omeprazole 20 MG capsule Commonly known as:  PRILOSEC Take 20 mg by mouth daily.   PRESERVISION AREDS 2 PO Take 1 tablet by mouth daily.   rOPINIRole 1 MG tablet Commonly known as: REQUIP Take 1 mg by mouth 3 (three) times daily.   simvastatin 40 MG tablet Commonly known as: ZOCOR TAKE 1 TABLET BY MOUTH EVERY NIGHT AT BEDTIME What changed: when to take this   vitamin B-12 500 MCG tablet Commonly known as: CYANOCOBALAMIN Take 500 mcg by mouth every morning.   Vitamin D3 25 MCG (1000 UT) Caps Take 1,000 Units by mouth daily.        Allergies:  No Known Allergies  Family History: Family History  Problem Relation Age of Onset   COPD Mother     Social History:  reports that he has never smoked. He has never used smokeless tobacco. He reports current alcohol use. He reports that he does not use drugs.   Physical Exam: BP 138/65   Pulse 71    Ht '5\' 8"'$  (1.727 m)   Wt 161 lb (73 kg)   BMI 24.48 kg/m   Constitutional:  Alert and oriented, No acute distress. HEENT: [place] AT, moist mucus membranes.  Trachea midline, no masses. Cardiovascular: No clubbing, cyanosis, or edema. Respiratory: Normal respiratory effort, no increased work of breathing. Skin: No rashes, bruises or suspicious lesions. Neurologic: Grossly intact, no focal deficits, moving all 4 extremities. Psychiatric: Normal mood and affect.  Laboratory Data:  Lab Results  Component Value Date   CREATININE 1.03 02/21/2022   Lab Results  Component Value Date   HGBA1C 5.7 (H) 02/21/2022    Assessment & Plan:   Prostate cancer  - Advanced metastatic  - Currently only on ADT and Xtandi with palliative intent  - PSA; pending    2. Urinary frequency  - Bothersome and he would like treatment for this . He is interested in condom catheter.  No follow-ups on file.  I,Jason Murphy,acting as a Education administrator for Jason Espy, MD.,have documented all relevant documentation on the behalf of Jason Espy, MD,as directed by  Jason Espy, MD while in the presence of Jason Murphy, [place] [address], [place], [place] [address] [phone]

## 2022-03-09 LAB — PSA: Prostate Specific Ag, Serum: 12.8 ng/mL — ABNORMAL HIGH (ref 0.0–4.0)

## 2022-03-10 ENCOUNTER — Telehealth: Payer: Self-pay | Admitting: *Deleted

## 2022-03-10 NOTE — Telephone Encounter (Addendum)
Patient informed, voiced understanding.     ----- Message from Hollice Espy, MD sent at 03/09/2022  8:25 AM EDT ----- PSA is trending down beautifully, now 12.8, was as high as 2000- 2 years ago.  This is good news.  It means the oral pill and injections are working to keep the prostate cancer under control.  Hollice Espy, MD

## 2022-03-18 ENCOUNTER — Ambulatory Visit: Payer: Medicare PPO | Admitting: Physician Assistant

## 2022-03-18 ENCOUNTER — Emergency Department
Admission: EM | Admit: 2022-03-18 | Discharge: 2022-03-18 | Disposition: A | Discharge status: Home / Self Care | Payer: Medicare PPO | Attending: Emergency Medicine | Admitting: Emergency Medicine

## 2022-03-18 ENCOUNTER — Emergency Department: Payer: Medicare PPO

## 2022-03-18 DIAGNOSIS — I2510 Atherosclerotic heart disease of native coronary artery without angina pectoris: Secondary | ICD-10-CM | POA: Diagnosis not present

## 2022-03-18 DIAGNOSIS — W1839XA Other fall on same level, initial encounter: Secondary | ICD-10-CM | POA: Diagnosis not present

## 2022-03-18 DIAGNOSIS — I10 Essential (primary) hypertension: Secondary | ICD-10-CM | POA: Insufficient documentation

## 2022-03-18 DIAGNOSIS — R42 Dizziness and giddiness: Secondary | ICD-10-CM | POA: Diagnosis not present

## 2022-03-18 LAB — COMPREHENSIVE METABOLIC PANEL
ALT: 6 U/L (ref 0–44)
AST: 18 U/L (ref 15–41)
Albumin: 3.7 g/dL (ref 3.5–5.0)
Alkaline Phosphatase: 55 U/L (ref 38–126)
Anion gap: 6 (ref 5–15)
BUN: 22 mg/dL (ref 8–23)
CO2: 28 mmol/L (ref 22–32)
Calcium: 9.1 mg/dL (ref 8.9–10.3)
Chloride: 105 mmol/L (ref 98–111)
Creatinine, Ser: 0.89 mg/dL (ref 0.61–1.24)
GFR, Estimated: >60 mL/min (ref >60)
Glucose, Bld: 166 mg/dL — ABNORMAL HIGH (ref 70–99)
Potassium: 3.9 mmol/L (ref 3.5–5.1)
Sodium: 139 mmol/L (ref 135–145)
Total Bilirubin: 0.9 mg/dL (ref 0.3–1.2)
Total Protein: 7.2 g/dL (ref 6.5–8.1)

## 2022-03-18 LAB — TROPONIN I (HIGH SENSITIVITY): Troponin I (High Sensitivity): 9 ng/L (ref <18)

## 2022-03-18 LAB — CBC
HCT: 37 % — ABNORMAL LOW (ref 39.0–52.0)
Hemoglobin: 12.1 g/dL — ABNORMAL LOW (ref 13.0–17.0)
MCH: 31.8 pg (ref 26.0–34.0)
MCHC: 32.7 g/dL (ref 30.0–36.0)
MCV: 97.4 fL (ref 80.0–100.0)
Platelets: 259 10*3/uL (ref 150–400)
RBC: 3.8 MIL/uL — ABNORMAL LOW (ref 4.22–5.81)
RDW: 13.5 % (ref 11.5–15.5)
WBC: 7.5 10*3/uL (ref 4.0–10.5)
nRBC: 0 % (ref 0.0–0.2)

## 2022-03-18 NOTE — ED Provider Notes (Signed)
[facility] Provider Note    Event Date/Time   First MD Initiated Contact with Patient 03/18/22 [phone]     (approximate)   History   Dizziness, witnessed fall  HPI  Jason Murphy is a 88 y.o. male with history of CAD, hypertension, paroxysmal atrial fibrillation, not on anticoagulation who presents for witnessed fall.  Patient apparently fell after breakfast, did not hit his head.  He denies any injury.  He reports that he "stays dizzy ".  Denies back pain chest pain abdominal pain.  No extremity injuries     Physical Exam   Triage Vital Signs: ED Triage Vitals  Enc Vitals Group     BP 03/18/22 0932 138/71     Pulse Rate 03/18/22 0929 (!) 57     Resp 03/18/22 0929 17     Temp 03/18/22 0929 98.2 F (36.8 C)     Temp Source 03/18/22 0929 Oral     SpO2 03/18/22 0929 98 %     Weight 03/18/22 0931 77 kg (169 lb 12.1 oz)     Height 03/18/22 0931 1.803 m ('5\' 11"'$ )     Head Circumference --      Peak Flow --      Pain Score 03/18/22 0930 0     Pain Loc --      Pain Edu? --      Excl. in [place]? --     Most recent vital signs: Vitals:   03/18/22 0932 03/18/22 1044  BP: 138/71 140/77  Pulse:  60  Resp:  17  Temp:  98.5 F (36.9 C)  SpO2:  98%     General: Awake, no distress.  CV:  Good peripheral perfusion.  No chest wall tenderness palpation Resp:  Normal effort.  Abd:  No distention.  No tenderness to palpation Other:  No vertebral terms palpation, normal range of motion of all extremities.  No pain with axial load on both hips.  Normal cervical motion without pain   ED Results / Procedures / Treatments   Labs (all labs ordered are listed, but only abnormal results are displayed) Labs Reviewed  CBC - Abnormal; Notable for the following components:      Result Value   RBC 3.80 (*)    Hemoglobin 12.1 (*)    HCT 37.0 (*)    All other components within normal limits  COMPREHENSIVE METABOLIC PANEL - Abnormal; Notable for the following  components:   Glucose, Bld 166 (*)    All other components within normal limits  TROPONIN I (HIGH SENSITIVITY)     EKG  ED ECG REPORT I, Lavonia Drafts, the attending physician, personally viewed and interpreted this ECG.  Date: 03/18/2022  Rhythm: normal sinus rhythm QRS Axis: Abnormal Intervals: Left bundle branch block ST/T Wave abnormalities: normal Narrative Interpretation: no evidence of acute ischemia    RADIOLOGY Chest x-ray interpreted by me, no new abnormality    PROCEDURES:  Critical Care performed:   Procedures   MEDICATIONS ORDERED IN ED: Medications - No data to display   IMPRESSION / MDM / [place] / ED COURSE  I reviewed the triage vital signs and the nursing notes. Patient's presentation is most consistent with acute presentation with potential threat to life or bodily function.  Patient presents after a fall, questionable syncope/dizziness  Differential is extensive includes vasovagal syncope, dehydration, arrhythmia  EKG is unchanged from prior, high sensitive troponin is reassuring, CBC CMP are unremarkable.  No evidence of  arrhythmia on the monitor.  Patient reports that he stays dizzy and that this is not atypical for him  Not consistent with significant dehydration, clinically well-appearing no injury, appropriate for discharge at this time        FINAL CLINICAL IMPRESSION(S) / ED DIAGNOSES   Final diagnoses:  Dizziness     Rx / DC Orders   ED Discharge Orders     None        Note:  This document was prepared using Dragon voice recognition software and may include unintentional dictation errors.   Lavonia Drafts, MD 03/18/22 1120

## 2022-03-18 NOTE — ED Triage Notes (Signed)
After breakfast , fell to ground , witnessed, not hit head, unknown on thinners, pt axox4, pt from [facility],

## 2022-03-18 NOTE — ED Notes (Signed)
Pts daughter Livia Snellen contacted and will take patient back to [facility] . All questions answered , pt axox4 , able to ambulate

## 2022-03-29 ENCOUNTER — Inpatient Hospital Stay: Payer: Medicare PPO | Attending: Oncology

## 2022-03-29 ENCOUNTER — Ambulatory Visit: Payer: Medicare PPO | Admitting: Physician Assistant

## 2022-03-29 ENCOUNTER — Encounter: Payer: Self-pay | Admitting: Physician Assistant

## 2022-03-29 ENCOUNTER — Inpatient Hospital Stay: Payer: Medicare PPO | Admitting: Oncology

## 2022-03-29 ENCOUNTER — Inpatient Hospital Stay: Payer: Medicare PPO

## 2022-03-29 VITALS — BP 148/74 | HR 78 | Ht 71.0 in | Wt 169.0 lb

## 2022-03-29 DIAGNOSIS — R351 Nocturia: Secondary | ICD-10-CM

## 2022-03-29 NOTE — Progress Notes (Signed)
Patient presented to clinic today for catheter demonstration for management of nocturia.  I demonstrated catheter sizing procedure including penile circumference and length as well as catheter application and removal and the use of leg and night drainage bags.  I sent the patient home with a bag of samples to try and counseled him to contact clinic with his preferred size and length so we may submit a supply order for him. He expressed understanding.  [name], PA-C 03/29/22 10:05 AM  I spent 30 minutes on the day of the encounter to include pre-visit record review, face-to-face time with the patient, and post-visit ordering of tests.

## 2022-03-29 NOTE — Progress Notes (Deleted)
Since [facility]  [phone] [phone]  ID: Jason Murphy OB: 1920-04-20  MR#: [id number]  XIP#:[id number]  Patient Care Team: Baxter Hire, MD as PCP - General (Internal Medicine) Lloyd Huger, MD as Consulting Physician (Oncology)  CHIEF COMPLAINT: Stage IV prostate cancer.  INTERVAL HISTORY: Patient returns to clinic today for repeat laboratory, further evaluation, and continuation of treatment.  He celebrated his 88th birthday.  He currently feels well and is asymptomatic.  He does not complain of any pain.  He still remains active and drove him self to clinic today.  He has no neurologic complaints.  He denies any recent fevers or illnesses.  He has a good appetite and denies weight loss.  He has no chest pain, shortness of breath, cough, or hemoptysis.  He denies any nausea, vomiting, constipation, or diarrhea.  He has no urinary complaints.  Patient offers no specific complaints today.  REVIEW OF SYSTEMS:   Review of Systems  Constitutional: Negative.  Negative for fever, malaise/fatigue and weight loss.  Respiratory: Negative.  Negative for cough, hemoptysis and shortness of breath.   Cardiovascular: Negative.  Negative for chest pain and leg swelling.  Gastrointestinal: Negative.  Negative for abdominal pain.  Genitourinary: Negative.  Negative for dysuria.  Musculoskeletal: Negative.  Negative for back pain.  Skin: Negative.  Negative for rash.  Neurological: Negative.  Negative for dizziness, focal weakness, weakness and headaches.  Psychiatric/Behavioral: Negative.  The patient is not nervous/anxious.     As per HPI. Otherwise, a complete review of systems is negative.  PAST MEDICAL HISTORY: Past Medical History:  Diagnosis Date   Cancer [facility])    prostate   Coronary artery disease    Dizziness    chronic dizziness   Dyspnea    Fatigue    Progressive fatigue with question of sleep apnea   Hypercholesterolemia     Hypertension    Obstructive sleep apnea    Pneumonia    Restless leg syndrome     PAST SURGICAL HISTORY: Past Surgical History:  Procedure Laterality Date   APPENDECTOMY     CARDIAC CATHETERIZATION     Ejection Fraction was 60%, stent   KNEE SURGERY     arthroscopic left knee surgery   SHOULDER ARTHROSCOPY      FAMILY HISTORY: Family History  Problem Relation Age of Onset   COPD Mother     ADVANCED DIRECTIVES (Y/N):  N  HEALTH MAINTENANCE: Social History   Tobacco Use   Smoking status: Never   Smokeless tobacco: Never  Vaping Use   Vaping Use: Never used  Substance Use Topics   Alcohol use: Yes    Comment: 1/2 glass wine daily   Drug use: No     Colonoscopy:  PAP:  Bone density:  Lipid panel:  No Known Allergies  Current Outpatient Medications  Medication Sig Dispense Refill   aspirin EC 81 MG tablet Take 81 mg by mouth at bedtime.     Cholecalciferol (VITAMIN D3) 1000 units CAPS Take 1,000 Units by mouth daily.     co-enzyme Q-10 50 MG capsule Take 100 mg by mouth every morning.     cyanocobalamin 500 MCG tablet Take 500 mcg by mouth every morning.     enzalutamide (XTANDI) 80 MG tablet Take 2 tablets (160 mg total) by mouth daily. 60 tablet 1   feeding supplement (ENSURE ENLIVE / ENSURE PLUS) LIQD Take 237 mLs by mouth 3 (three) times daily between meals. 237 mL  12   finasteride (PROSCAR) 5 MG tablet Take 5 mg by mouth daily.     isosorbide mononitrate (IMDUR) 30 MG 24 hr tablet Take 30 mg by mouth daily.     levothyroxine (SYNTHROID, LEVOTHROID) 50 MCG tablet Take 50 mcg by mouth daily before breakfast.     meclizine (ANTIVERT) 25 MG tablet Take 25 mg by mouth 2 (two) times daily as needed for dizziness.     Multiple Vitamins-Minerals (PRESERVISION AREDS 2 PO) Take 1 tablet by mouth daily.     nitroGLYCERIN (NITROSTAT) 0.4 MG SL tablet Place 0.4 mg under the tongue every 5 (five) minutes as needed for chest pain.     omeprazole (PRILOSEC) 20 MG capsule  Take 20 mg by mouth daily.     rOPINIRole (REQUIP) 1 MG tablet Take 1 mg by mouth 3 (three) times daily.     simvastatin (ZOCOR) 40 MG tablet TAKE 1 TABLET BY MOUTH EVERY NIGHT AT BEDTIME (Patient taking differently: Take 40 mg by mouth daily at 6 PM.) 30 tablet 5   No current facility-administered medications for this visit.    OBJECTIVE: There were no vitals filed for this visit.     There is no height or weight on file to calculate BMI.    ECOG FS:0 - Asymptomatic  General: Well-developed, well-nourished, no acute distress. Eyes: Pink conjunctiva, anicteric sclera. HEENT: Normocephalic, moist mucous membranes. Lungs: No audible wheezing or coughing. Heart: Regular rate and rhythm. Abdomen: Soft, nontender, no obvious distention. Musculoskeletal: No edema, cyanosis, or clubbing. Neuro: Alert, answering all questions appropriately. Cranial nerves grossly intact. Skin: No rashes or petechiae noted. Psych: Normal affect.  LAB RESULTS:  Lab Results  Component Value Date   NA 139 03/18/2022   K 3.9 03/18/2022   CL 105 03/18/2022   CO2 28 03/18/2022   GLUCOSE 166 (H) 03/18/2022   BUN 22 03/18/2022   CREATININE 0.89 03/18/2022   CALCIUM 9.1 03/18/2022   PROT 7.2 03/18/2022   ALBUMIN 3.7 03/18/2022   AST 18 03/18/2022   ALT 6 03/18/2022   ALKPHOS 55 03/18/2022   BILITOT 0.9 03/18/2022   GFRNONAA >60 03/18/2022   GFRAA >60 06/23/2020    Lab Results  Component Value Date   WBC 7.5 03/18/2022   NEUTROABS 4.6 02/21/2022   HGB 12.1 (L) 03/18/2022   HCT 37.0 (L) 03/18/2022   MCV 97.4 03/18/2022   PLT 259 03/18/2022     STUDIES: DG Chest Port 1 View  Result Date: 03/18/2022 CLINICAL DATA:  One 88 year old male with history of weakness. Fall. EXAM: PORTABLE CHEST 1 VIEW COMPARISON:  Chest x-ray 02/09/2021. FINDINGS: Lung volumes are normal. No consolidative airspace disease. No pleural effusions. No pneumothorax. No pulmonary nodule or mass noted. Pulmonary  vasculature and the cardiomediastinal silhouette are within normal limits. Atherosclerosis in the thoracic aorta. Widespread sclerotic lesions throughout the visualized skeleton, indicative of diffuse osseous metastatic disease. IMPRESSION: 1. No radiographic evidence of acute cardiopulmonary disease. 2. Diffuse metastatic disease to the bones again noted, compatible with known prostate cancer. 3. Aortic atherosclerosis. Electronically Signed   By: Vinnie Langton M.D.   On: 03/18/2022 09:46    ASSESSMENT: Stage IV prostate cancer.  PLAN:    1.  Stage IV prostate cancer: Upon diagnosis in July 2021, patient's PSA was greater than 2300.  After initiation of treatment, his PSA decreased to 47.1 in May 2022, but then he did not receive any treatment until recently.  Since reinitiating Eligard in November 2022,  patient's PSA has decreased from 525-244.  Proceed with Zometa today.  Return to clinic in 3 months with repeat laboratory work, further evaluation, and consideration of Zometa and Eligard if needed.  Continue follow-up with urology as scheduled.    I spent a total of 30 minutes reviewing chart data, face-to-face evaluation with the patient, counseling and coordination of care as detailed above.  Patient expressed understanding and was in agreement with this plan. He also understands that He can call clinic at any time with any questions, concerns, or complaints.    Cancer Staging  PROSTATE CANCER Staging form: Prostate, AJCC 7th Edition - Clinical: Stage IV (TX, NX, M1b, PSA: 20 or greater) - Signed by Lloyd Huger, MD on 08/15/2020   Lloyd Huger, MD   03/29/2022 6:44 AM

## 2022-03-29 NOTE — Patient Instructions (Signed)
When you get home today, I want you to measure your penis to figure out what size condom catheter you wear: First measure the circumference of your penis using the sizing card I gave you. The circumference that is most comfortable on your penis will correspond to a color and number. Write those down here: _________________ Next measure the length of your penis using the sizing card I gave you. If your penis is 5cm or less in length, then you need a sport length catheter. If your penis is more than 5cm in length, then you need a standard length catheter. When in doubt, I recommend using a shorter catheter so that you don't have extra catheter material and adhesive bunching up at the base of the penis. You may write down the length that you need here: ______________  I have sent you home with two each of several different sizes of catheters. Start by trying on a catheter with the color and number that matches what you wrote down above. If this feels too tight or too loose, then you can try one size bigger or smaller. You also received a mix of lengths of catheters today. You can figure out the length of the catheter you are trying on by flipping the package over: there are two condom catheter icons on the back, one shorter (sport length) and one longer (standard length), and the one with the check mark on it is the length of that catheter.  Once you figure out what size and length of catheter works best for you, call our office and give [place] this information so that we can order catheter supplies to be sent to your home.  Condom Cath Patient Instruction Guide  Applying the male external catheter    Use the sizing guide to ensure a proper fit of the Male external catheter Always measure for correct size (both circumference and length with appropriate measuring guide for specific product) Opening the male external catheter   Flip open the pack using your thumb nail to break the seal.  remove catheter  from package  Remove the catheter from the package and place it over the head of the penis. Leave a small space between the end of the penis and the narrow catheter outlet. Uncircumcised users should leave the foreskin in place over the head of the penis. male external catheter application   Pull the double grip strip to slowly unroll the catheter all the way up the length of the penis. The catheter should unroll smoothly and evenly. squeeze catheter to apply   Gently squeeze the catheter around the shaft of the penis (approx. 1 min.) to ensure a secure fit. To reduce the risk of skin irritation, allow the skin on the penis to breathe for short periods in between male external catheter changes.     Connecting the Urine Bag Connecting the [facility] to the Urine bag  Connect a urine collecting bag to the catheter by fully inserting the tubing connector into the external catheter outlet. Push together firmly for a secure connection.    Removal is easy and painless. The catheter can be removed by detaching the catheter from the urine bag connector and carefully rolling it off the penis. If you need to, use warm soapy water to help remove the catheter. It can then be disposed of in the trash, with general household waste. Finish by washing your hands. Change the male external catheter everyday and change the bag according to recommendations from your healthcare  professional.

## 2022-04-01 ENCOUNTER — Other Ambulatory Visit: Payer: Self-pay | Admitting: *Deleted

## 2022-04-01 MED ORDER — ENZALUTAMIDE 80 MG PO TABS: 160.0000 mg | ORAL_TABLET | Freq: Every day | ORAL | 1 refills | Status: DC

## 2022-04-04 ENCOUNTER — Emergency Department
Admission: EM | Admit: 2022-04-04 | Discharge: 2022-04-04 | Disposition: A | Discharge status: Home / Self Care | Payer: Medicare PPO | Attending: Emergency Medicine | Admitting: Emergency Medicine

## 2022-04-04 DIAGNOSIS — I2510 Atherosclerotic heart disease of native coronary artery without angina pectoris: Secondary | ICD-10-CM | POA: Insufficient documentation

## 2022-04-04 DIAGNOSIS — Z8546 Personal history of malignant neoplasm of prostate: Secondary | ICD-10-CM | POA: Insufficient documentation

## 2022-04-04 DIAGNOSIS — R42 Dizziness and giddiness: Secondary | ICD-10-CM | POA: Diagnosis not present

## 2022-04-04 DIAGNOSIS — R55 Syncope and collapse: Secondary | ICD-10-CM

## 2022-04-04 DIAGNOSIS — I4891 Unspecified atrial fibrillation: Secondary | ICD-10-CM | POA: Insufficient documentation

## 2022-04-04 LAB — CBC WITH DIFFERENTIAL/PLATELET
Abs Immature Granulocytes: 0.01 10*3/uL (ref 0.00–0.07)
Basophils Absolute: 0 10*3/uL (ref 0.0–0.1)
Basophils Relative: 1 %
Eosinophils Absolute: 0.1 10*3/uL (ref 0.0–0.5)
Eosinophils Relative: 2 %
HCT: 39.7 % (ref 39.0–52.0)
Hemoglobin: 12.9 g/dL — ABNORMAL LOW (ref 13.0–17.0)
Immature Granulocytes: 0 %
Lymphocytes Relative: 30 %
Lymphs Abs: 1.8 10*3/uL (ref 0.7–4.0)
MCH: 31.5 pg (ref 26.0–34.0)
MCHC: 32.5 g/dL (ref 30.0–36.0)
MCV: 97.1 fL (ref 80.0–100.0)
Monocytes Absolute: 0.6 10*3/uL (ref 0.1–1.0)
Monocytes Relative: 10 %
Neutro Abs: 3.5 10*3/uL (ref 1.7–7.7)
Neutrophils Relative %: 57 %
Platelets: 329 10*3/uL (ref 150–400)
RBC: 4.09 MIL/uL — ABNORMAL LOW (ref 4.22–5.81)
RDW: 13.2 % (ref 11.5–15.5)
WBC: 6 10*3/uL (ref 4.0–10.5)
nRBC: 0 % (ref 0.0–0.2)

## 2022-04-04 LAB — COMPREHENSIVE METABOLIC PANEL
ALT: 8 U/L (ref 0–44)
AST: 22 U/L (ref 15–41)
Albumin: 3.9 g/dL (ref 3.5–5.0)
Alkaline Phosphatase: 59 U/L (ref 38–126)
Anion gap: 7 (ref 5–15)
BUN: 20 mg/dL (ref 8–23)
CO2: 29 mmol/L (ref 22–32)
Calcium: 9.3 mg/dL (ref 8.9–10.3)
Chloride: 105 mmol/L (ref 98–111)
Creatinine, Ser: 0.91 mg/dL (ref 0.61–1.24)
GFR, Estimated: >60 mL/min (ref >60)
Glucose, Bld: 158 mg/dL — ABNORMAL HIGH (ref 70–99)
Potassium: 3.9 mmol/L (ref 3.5–5.1)
Sodium: 141 mmol/L (ref 135–145)
Total Bilirubin: 1.1 mg/dL (ref 0.3–1.2)
Total Protein: 7.6 g/dL (ref 6.5–8.1)

## 2022-04-04 LAB — TROPONIN I (HIGH SENSITIVITY)
Troponin I (High Sensitivity): 8 ng/L (ref <18)
Troponin I (High Sensitivity): 7 ng/L (ref <18)

## 2022-04-04 LAB — MAGNESIUM: Magnesium: 2.1 mg/dL (ref 1.7–2.4)

## 2022-04-04 NOTE — ED Triage Notes (Signed)
Pt dizziness , baradycardia

## 2022-04-04 NOTE — ED Provider Notes (Signed)
[facility] Provider Note    Event Date/Time   First MD Initiated Contact with Patient 04/04/22 [phone]     (approximate)   History   Dizziness Loletha Grayer cardia and dizziness )   HPI  Jason Murphy is a 88 y.o. male who presents to the ED for evaluation of Dizziness Loletha Grayer cardia and dizziness )   I reviewed PCP visit from March.  History of prostate cancer.  CAD and atrial fibrillation not on anticoagulation.  HLD.  ASA 81 is only thinner.  Meclizine for vertigo.  GERD.  Patient presents to the ED for evaluation of dizziness and reported prehospital bradycardia and hypotension.  EMS reports heart rates as low as the 30s and hypertensive blood pressures.  As soon as patient arrived to the ED, he was repeatedly asking to pass a bowel movement.  He is helped from the stretcher to the commode and passes a solid brown bowel movement and reports feeling much better and is immediately requested to go home after this. No complaints.  Requesting scotch to drink  Physical Exam   Triage Vital Signs: ED Triage Vitals [04/04/22 0949]  Enc Vitals Group     BP      Pulse      Resp      Temp      Temp src      SpO2      Weight 169 lb 12.1 oz (77 kg)     Height '5\' 9"'$  (1.753 m)     Head Circumference      Peak Flow      Pain Score      Pain Loc      Pain Edu?      Excl. in [place]?     Most recent vital signs: Vitals:   04/04/22 0949 04/04/22 1154  BP: (!) 183/85 (!) 160/87  Pulse: 80 64  Resp: 17 18  Temp: 98.4 F (36.9 C) 98.1 F (36.7 C)  SpO2: 98% 96%    General: Awake, no distress.  Pleasant and conversational. CV:  Good peripheral perfusion. RRR Resp:  Normal effort.  Abd:  No distention.  Soft and benign MSK:  No deformity noted.  Neuro:  No focal deficits appreciated. Cranial nerves II through XII intact 5/5 strength and sensation in all 4 extremities Other:     ED Results / Procedures / Treatments   Labs (all labs ordered are listed, but  only abnormal results are displayed) Labs Reviewed  COMPREHENSIVE METABOLIC PANEL - Abnormal; Notable for the following components:      Result Value   Glucose, Bld 158 (*)    All other components within normal limits  CBC WITH DIFFERENTIAL/PLATELET - Abnormal; Notable for the following components:   RBC 4.09 (*)    Hemoglobin 12.9 (*)    All other components within normal limits  MAGNESIUM  URINALYSIS, ROUTINE W REFLEX MICROSCOPIC  TROPONIN I (HIGH SENSITIVITY)  TROPONIN I (HIGH SENSITIVITY)    EKG Sinus rhythm with a rate of 58 bpm.  Normal axis.  Left bundle.  No STEMI Boscia Boso criteria.  RADIOLOGY   Official radiology report(s): No results found.  PROCEDURES and INTERVENTIONS:  .1-3 Lead EKG Interpretation  Performed by: Vladimir Crofts, MD Authorized by: Vladimir Crofts, MD     Interpretation: normal     ECG rate:  62   ECG rate assessment: normal     Rhythm: sinus rhythm     Ectopy: none  Conduction: normal     Medications - No data to display   IMPRESSION / MDM / [place] / ED COURSE  I reviewed the triage vital signs and the nursing notes.  Differential diagnosis includes, but is not limited to, vasovagal episode, symptomatic bradycardia, stroke, dehydration  {Patient presents with symptoms of an acute illness or injury that is potentially life-threatening.  88 year old male presents to the ED with dizziness and reported bradycardia/hypotension in the setting of need to pass a bowel movement, resolved with this and possibly due to vasovagal reaction.  He looks well in the ED and has normal vital signs while being observed for greater than 4 hours.  No dysrhythmia, high-grade AV block or significant bradycardia noted.  Remains normotensive and hypertensive without hypotension.  He remains asymptomatic after passing a bowel movement and I just suspect he had a vasovagal episode.  His abdomen is benign a.m. work-up is benign.  Normal CBC and metabolic  panel.  Normal troponins and magnesium.  I see no indications for further diagnostics or admission.  We will discharge back to his SNF.  Clinical Course as of 04/04/22 1401  Mon Apr 04, 2022  1014 Reassessed.  Feels well.  Passed a bowel movement and is now in a chair and says he is back to baseline.  Reassuring hemodynamics [DS]  1123 Reassessed.  Looks well.  No recurrence of bradycardia or hypotension.  He is requesting discharge and scotch/water. [DS]  2703 Reassessed.  Feeling better.  Still requesting discharge.  Remained stable after 3 hours of observation without significant pericardial hypertension.  We will discharge back to SNF. [DS]    Clinical Course User Index [DS] Vladimir Crofts, MD     FINAL CLINICAL IMPRESSION(S) / ED DIAGNOSES   Final diagnoses:  Dizziness  Vasovagal episode     Rx / DC Orders   ED Discharge Orders     None        Note:  This document was prepared using Dragon voice recognition software and may include unintentional dictation errors.   Vladimir Crofts, MD 04/04/22 1401

## 2022-04-07 ENCOUNTER — Telehealth: Payer: Self-pay | Admitting: *Deleted

## 2022-04-07 NOTE — Telephone Encounter (Signed)
Please confirm if he requires sport or standard length.

## 2022-04-07 NOTE — Telephone Encounter (Signed)
Patient called and states the 49m Condom Cath will best fit him , please order

## 2022-04-11 NOTE — Telephone Encounter (Signed)
21mm only available in sport length; formed signed today and Crysta Albright to fax.

## 2022-04-13 ENCOUNTER — Emergency Department: Payer: Medicare PPO

## 2022-04-13 ENCOUNTER — Other Ambulatory Visit: Payer: Self-pay

## 2022-04-13 ENCOUNTER — Emergency Department
Admission: EM | Admit: 2022-04-13 | Discharge: 2022-04-13 | Disposition: A | Discharge status: Home / Self Care | Payer: Medicare PPO | Attending: Emergency Medicine | Admitting: Emergency Medicine

## 2022-04-13 DIAGNOSIS — S42132A Displaced fracture of coracoid process, left shoulder, initial encounter for closed fracture: Secondary | ICD-10-CM | POA: Diagnosis not present

## 2022-04-13 DIAGNOSIS — I10 Essential (primary) hypertension: Secondary | ICD-10-CM | POA: Diagnosis not present

## 2022-04-13 DIAGNOSIS — E039 Hypothyroidism, unspecified: Secondary | ICD-10-CM | POA: Diagnosis not present

## 2022-04-13 DIAGNOSIS — Y92481 Parking lot as the place of occurrence of the external cause: Secondary | ICD-10-CM | POA: Insufficient documentation

## 2022-04-13 DIAGNOSIS — I2510 Atherosclerotic heart disease of native coronary artery without angina pectoris: Secondary | ICD-10-CM | POA: Insufficient documentation

## 2022-04-13 DIAGNOSIS — S0990XA Unspecified injury of head, initial encounter: Secondary | ICD-10-CM | POA: Diagnosis not present

## 2022-04-13 DIAGNOSIS — Z8546 Personal history of malignant neoplasm of prostate: Secondary | ICD-10-CM | POA: Insufficient documentation

## 2022-04-13 DIAGNOSIS — S4992XA Unspecified injury of left shoulder and upper arm, initial encounter: Secondary | ICD-10-CM | POA: Diagnosis present

## 2022-04-13 MED ORDER — ACETAMINOPHEN 325 MG PO TABS
650.0000 mg | ORAL_TABLET | Freq: Once | ORAL | Status: AC
Start: 2022-04-13 — End: 2022-04-13
  Administered 2022-04-13: 650 mg via ORAL
  Filled 2022-04-13: qty 2
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
## 2022-04-13 NOTE — Discharge Instructions (Signed)
Keep your arm in the sling. Please follow up with orthopedics this week. Please return for new, worsening, or change in symptoms or other concerns
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
## 2022-04-18 ENCOUNTER — Telehealth: Payer: Self-pay | Admitting: *Deleted

## 2022-04-18 NOTE — Telephone Encounter (Signed)
Alternatives for urinary leakage include a penile incontinence clamp or absorbant products.

## 2022-04-18 NOTE — Telephone Encounter (Signed)
Physical calling with patient in the office stating that the condom cath is not working for him. What are his alternatives? Please advise

## 2022-04-18 NOTE — Telephone Encounter (Signed)
Spoke with receptionist and advised results, she will speak with physician.

## 2022-04-22 ENCOUNTER — Telehealth: Payer: Self-pay | Admitting: Family Medicine

## 2022-04-22 NOTE — Telephone Encounter (Signed)
Drae from [facility] called and wanted to schedule an appointment for patient for [place]. I advised him that he had an appointment for this on 03/29/22 and there was a conversation on the condom catheters. Drae was unaware of the appointment and requested a fax with the note. I faxed the office note to [phone]. I informed him of his next scheduled appointment.

## 2022-09-13 ENCOUNTER — Ambulatory Visit: Payer: Medicare PPO | Admitting: Urology

## 2022-09-27 ENCOUNTER — Ambulatory Visit: Payer: Medicare PPO | Admitting: Urology

## 2022-11-01 ENCOUNTER — Encounter: Payer: Self-pay | Admitting: Emergency Medicine

## 2022-11-01 ENCOUNTER — Emergency Department: Payer: Medicare HMO

## 2022-11-01 ENCOUNTER — Inpatient Hospital Stay
Admission: EM | Admit: 2022-11-01 | Discharge: 2022-11-10 | DRG: 480 | Disposition: A | Discharge status: Skilled Nursing Facility | Payer: Medicare HMO | Source: Skilled Nursing Facility | Attending: Osteopathic Medicine | Admitting: Osteopathic Medicine

## 2022-11-01 ENCOUNTER — Encounter: Payer: Self-pay | Admitting: Oncology

## 2022-11-01 DIAGNOSIS — F05 Delirium due to known physiological condition: Secondary | ICD-10-CM | POA: Diagnosis not present

## 2022-11-01 DIAGNOSIS — I2510 Atherosclerotic heart disease of native coronary artery without angina pectoris: Secondary | ICD-10-CM | POA: Diagnosis present

## 2022-11-01 DIAGNOSIS — I447 Left bundle-branch block, unspecified: Secondary | ICD-10-CM | POA: Diagnosis present

## 2022-11-01 DIAGNOSIS — S72141A Displaced intertrochanteric fracture of right femur, initial encounter for closed fracture: Principal | ICD-10-CM | POA: Diagnosis present

## 2022-11-01 DIAGNOSIS — J189 Pneumonia, unspecified organism: Secondary | ICD-10-CM | POA: Diagnosis not present

## 2022-11-01 DIAGNOSIS — C7951 Secondary malignant neoplasm of bone: Secondary | ICD-10-CM | POA: Diagnosis present

## 2022-11-01 DIAGNOSIS — E669 Obesity, unspecified: Secondary | ICD-10-CM | POA: Diagnosis present

## 2022-11-01 DIAGNOSIS — I440 Atrioventricular block, first degree: Secondary | ICD-10-CM | POA: Diagnosis present

## 2022-11-01 DIAGNOSIS — Z79890 Hormone replacement therapy (postmenopausal): Secondary | ICD-10-CM

## 2022-11-01 DIAGNOSIS — E871 Hypo-osmolality and hyponatremia: Secondary | ICD-10-CM | POA: Diagnosis present

## 2022-11-01 DIAGNOSIS — I480 Paroxysmal atrial fibrillation: Secondary | ICD-10-CM | POA: Diagnosis present

## 2022-11-01 DIAGNOSIS — R0902 Hypoxemia: Secondary | ICD-10-CM | POA: Diagnosis not present

## 2022-11-01 DIAGNOSIS — W010XXA Fall on same level from slipping, tripping and stumbling without subsequent striking against object, initial encounter: Secondary | ICD-10-CM | POA: Diagnosis present

## 2022-11-01 DIAGNOSIS — C61 Malignant neoplasm of prostate: Secondary | ICD-10-CM | POA: Diagnosis present

## 2022-11-01 DIAGNOSIS — I10 Essential (primary) hypertension: Secondary | ICD-10-CM | POA: Diagnosis present

## 2022-11-01 DIAGNOSIS — E7800 Pure hypercholesterolemia, unspecified: Secondary | ICD-10-CM | POA: Diagnosis present

## 2022-11-01 DIAGNOSIS — S72144A Nondisplaced intertrochanteric fracture of right femur, initial encounter for closed fracture: Secondary | ICD-10-CM

## 2022-11-01 DIAGNOSIS — R45851 Suicidal ideations: Secondary | ICD-10-CM | POA: Diagnosis not present

## 2022-11-01 DIAGNOSIS — I7143 Infrarenal abdominal aortic aneurysm, without rupture: Secondary | ICD-10-CM | POA: Diagnosis not present

## 2022-11-01 DIAGNOSIS — Z8546 Personal history of malignant neoplasm of prostate: Secondary | ICD-10-CM

## 2022-11-01 DIAGNOSIS — Z66 Do not resuscitate: Secondary | ICD-10-CM | POA: Diagnosis present

## 2022-11-01 DIAGNOSIS — E872 Acidosis, unspecified: Secondary | ICD-10-CM | POA: Diagnosis present

## 2022-11-01 DIAGNOSIS — Y95 Nosocomial condition: Secondary | ICD-10-CM | POA: Diagnosis not present

## 2022-11-01 DIAGNOSIS — Z600 Problems of adjustment to life-cycle transitions: Secondary | ICD-10-CM | POA: Diagnosis not present

## 2022-11-01 DIAGNOSIS — E876 Hypokalemia: Secondary | ICD-10-CM | POA: Diagnosis not present

## 2022-11-01 DIAGNOSIS — S7290XA Unspecified fracture of unspecified femur, initial encounter for closed fracture: Secondary | ICD-10-CM | POA: Diagnosis present

## 2022-11-01 DIAGNOSIS — G2581 Restless legs syndrome: Secondary | ICD-10-CM | POA: Diagnosis present

## 2022-11-01 DIAGNOSIS — Z825 Family history of asthma and other chronic lower respiratory diseases: Secondary | ICD-10-CM | POA: Diagnosis not present

## 2022-11-01 DIAGNOSIS — Z7982 Long term (current) use of aspirin: Secondary | ICD-10-CM

## 2022-11-01 DIAGNOSIS — T1490XA Injury, unspecified, initial encounter: Secondary | ICD-10-CM

## 2022-11-01 DIAGNOSIS — W19XXXA Unspecified fall, initial encounter: Principal | ICD-10-CM

## 2022-11-01 DIAGNOSIS — M25551 Pain in right hip: Secondary | ICD-10-CM

## 2022-11-01 DIAGNOSIS — I714 Abdominal aortic aneurysm, without rupture, unspecified: Secondary | ICD-10-CM | POA: Diagnosis present

## 2022-11-01 DIAGNOSIS — Z79899 Other long term (current) drug therapy: Secondary | ICD-10-CM

## 2022-11-01 DIAGNOSIS — G4733 Obstructive sleep apnea (adult) (pediatric): Secondary | ICD-10-CM | POA: Diagnosis present

## 2022-11-01 DIAGNOSIS — Z6824 Body mass index (BMI) 24.0-24.9, adult: Secondary | ICD-10-CM

## 2022-11-01 DIAGNOSIS — Z8673 Personal history of transient ischemic attack (TIA), and cerebral infarction without residual deficits: Secondary | ICD-10-CM | POA: Diagnosis not present

## 2022-11-01 LAB — BASIC METABOLIC PANEL
Anion gap: 6 (ref 5–15)
BUN: 26 mg/dL — ABNORMAL HIGH (ref 8–23)
CO2: 26 mmol/L (ref 22–32)
Calcium: 8.7 mg/dL — ABNORMAL LOW (ref 8.9–10.3)
Chloride: 102 mmol/L (ref 98–111)
Creatinine, Ser: 0.96 mg/dL (ref 0.61–1.24)
GFR, Estimated: >60 mL/min (ref >60)
Glucose, Bld: 112 mg/dL — ABNORMAL HIGH (ref 70–99)
Potassium: 4 mmol/L (ref 3.5–5.1)
Sodium: 134 mmol/L — ABNORMAL LOW (ref 135–145)

## 2022-11-01 LAB — CBC
HCT: 35.5 % — ABNORMAL LOW (ref 39.0–52.0)
Hemoglobin: 11.6 g/dL — ABNORMAL LOW (ref 13.0–17.0)
MCH: 30.7 pg (ref 26.0–34.0)
MCHC: 32.7 g/dL (ref 30.0–36.0)
MCV: 93.9 fL (ref 80.0–100.0)
Platelets: 239 K/uL (ref 150–400)
RBC: 3.78 MIL/uL — ABNORMAL LOW (ref 4.22–5.81)
RDW: 13.4 % (ref 11.5–15.5)
WBC: 5.9 K/uL (ref 4.0–10.5)
nRBC: 0 % (ref 0.0–0.2)

## 2022-11-01 LAB — TYPE AND SCREEN
ABO/RH(D): A POS
Antibody Screen: NEGATIVE

## 2022-11-01 MED ORDER — CEFAZOLIN SODIUM-DEXTROSE 2-4 GM/100ML-% IV SOLN
2.0000 g | INTRAVENOUS | Status: AC
  Administered 2022-11-02: 2 g via INTRAVENOUS
  Filled 2022-11-01: qty 100

## 2022-11-01 MED ORDER — METHOCARBAMOL 1000 MG/10ML IJ SOLN: 500.0000 mg | Freq: Four times a day (QID) | INTRAVENOUS | Status: DC | PRN

## 2022-11-01 MED ORDER — FENTANYL CITRATE PF 50 MCG/ML IJ SOSY
50.0000 ug | PREFILLED_SYRINGE | Freq: Once | INTRAMUSCULAR | Status: AC
  Administered 2022-11-01: 50 ug via INTRAVENOUS
  Filled 2022-11-01: qty 1

## 2022-11-01 MED ORDER — MORPHINE SULFATE (PF) 4 MG/ML IV SOLN
4.0000 mg | Freq: Once | INTRAVENOUS | Status: AC
  Administered 2022-11-01: 4 mg via INTRAVENOUS
  Filled 2022-11-01: qty 1

## 2022-11-01 MED ORDER — ROPINIROLE HCL 1 MG PO TABS
1.0000 mg | ORAL_TABLET | Freq: Every day | ORAL | Status: DC
  Administered 2022-11-01 – 2022-11-09 (×7): 1 mg via ORAL
  Filled 2022-11-01 (×9): qty 1

## 2022-11-01 MED ORDER — HYDROMORPHONE HCL 1 MG/ML IJ SOLN
1.0000 mg | INTRAMUSCULAR | Status: DC | PRN
  Administered 2022-11-01 – 2022-11-02 (×2): 1 mg via INTRAVENOUS
  Filled 2022-11-01 (×2): qty 1

## 2022-11-01 MED ORDER — ONDANSETRON HCL 4 MG/2ML IJ SOLN
4.0000 mg | INTRAMUSCULAR | Status: AC
  Administered 2022-11-01: 4 mg via INTRAVENOUS
  Filled 2022-11-01: qty 2

## 2022-11-01 MED ORDER — HEPARIN SODIUM (PORCINE) 5000 UNIT/ML IJ SOLN
5000.0000 [IU] | Freq: Three times a day (TID) | INTRAMUSCULAR | Status: AC
  Administered 2022-11-01: 5000 [IU] via SUBCUTANEOUS
  Filled 2022-11-01: qty 1

## 2022-11-01 MED ORDER — ASPIRIN 81 MG PO TBEC
81.0000 mg | DELAYED_RELEASE_TABLET | Freq: Every day | ORAL | Status: DC
  Administered 2022-11-01 – 2022-11-09 (×7): 81 mg via ORAL
  Filled 2022-11-01 (×9): qty 1

## 2022-11-01 MED ORDER — PANTOPRAZOLE SODIUM 40 MG PO TBEC
40.0000 mg | DELAYED_RELEASE_TABLET | Freq: Every day | ORAL | Status: DC
  Administered 2022-11-04 – 2022-11-10 (×7): 40 mg via ORAL
  Filled 2022-11-01 (×7): qty 1

## 2022-11-01 MED ORDER — HYDROCODONE-ACETAMINOPHEN 5-325 MG PO TABS
1.0000 | ORAL_TABLET | Freq: Four times a day (QID) | ORAL | Status: DC | PRN
  Administered 2022-11-01: 2 via ORAL
  Filled 2022-11-01: qty 2

## 2022-11-01 MED ORDER — LEVOTHYROXINE SODIUM 50 MCG PO TABS
50.0000 ug | ORAL_TABLET | Freq: Every day | ORAL | Status: DC
  Administered 2022-11-02 – 2022-11-10 (×8): 50 ug via ORAL
  Filled 2022-11-01 (×9): qty 1

## 2022-11-01 MED ORDER — FENTANYL CITRATE PF 50 MCG/ML IJ SOSY
25.0000 ug | PREFILLED_SYRINGE | Freq: Once | INTRAMUSCULAR | Status: AC
  Administered 2022-11-01: 25 ug via INTRAVENOUS
  Filled 2022-11-01: qty 1

## 2022-11-01 MED ORDER — POLYETHYLENE GLYCOL 3350 17 G PO PACK: 17.0000 g | PACK | Freq: Every day | ORAL | Status: DC | PRN

## 2022-11-01 MED ORDER — SIMVASTATIN 20 MG PO TABS
40.0000 mg | ORAL_TABLET | Freq: Every day | ORAL | Status: DC
  Administered 2022-11-01 – 2022-11-09 (×8): 40 mg via ORAL
  Filled 2022-11-01 (×9): qty 2

## 2022-11-01 NOTE — ED Notes (Signed)
Resumed care from [place] rn.  Pt alert.  Pain meds given.  Family with pt.  Pt has right hip pain.

## 2022-11-01 NOTE — Assessment & Plan Note (Addendum)
Patient is presenting after a ground-level fall that was mechanical in nature with CT imaging demonstrating an intertrochanteric fracture.  Patient has evidence of diffuse sclerotic lesions consistent with metastatic prostate cancer.  Discussed with orthopedic surgery; no contraindication to move forward with femur repair.  - Orthopedic surgery consulted; appreciate their recommendations - Plans for the OR tomorrow, 11/02/2022 - Hold DVT prophylaxis after midnight - N.p.o. after midnight - Pain control with Norco, Dilaudid and Robaxin - Postoperative PT/OT - TOC consultation for DME/home health needs - Supplemental oxygen as needed while bedbound and receiving opioids in addition to pulmonary toilet

## 2022-11-01 NOTE — Assessment & Plan Note (Signed)
On CT imaging today, there was demonstration of a 5.3 x 5.6 cm infrarenal AAA with no evidence of rupture.  Previous imaging was reviewed, with CT in 2021 that demonstrated a 5 cm AAA.  Discussed with vascular surgery, no need for any further evaluation or intervention prior to femur repair tomorrow morning.  - Recommend outpatient follow-up with vascular surgery for further monitoring

## 2022-11-01 NOTE — Assessment & Plan Note (Signed)
Patient has a history of hypertension but is not currently on any medication for this.  Blood pressure today is elevated but in the setting of severe pain.  - No indication for antihypertensives at this time. - Pain control as noted above

## 2022-11-01 NOTE — ED Triage Notes (Signed)
Pt comes by EMS after a fall. Pt landed on his right side. Pt has right hip pain. Pt refused pain meds or BP cuff with EMS. Pt did not want to come to ED but EMS personnel persuaded him. A&Ox4. Pt denies hitting his head.

## 2022-11-01 NOTE — ED Provider Notes (Signed)
[facility] Provider Note    Event Date/Time   First MD Initiated Contact with Patient 11/01/22 1244     (approximate)   History   Fall (Right hip pain)   HPI  Jason Murphy is a 89 y.o. male  History of previous TIA paroxysmal A-fib coronary disease  Patient is at his residence, reports he was using walker and it slipped out from underneath him causing him to fall onto his right hip.  Denies any head injury, reports pain over the right hip joint, initially was mild but now seems to be having shots of pain into it.  Pain severe when it is present  Denies any other injury.  No neck pain no back pain.  Hurts quite a lot whenever he moves the right leg at all      Physical Exam   Triage Vital Signs: ED Triage Vitals  Enc Vitals Group     BP 11/01/22 1250 (!) 158/70     Pulse Rate 11/01/22 1250 62     Resp 11/01/22 1250 18     Temp 11/01/22 1250 97.7 F (36.5 C)     Temp Source 11/01/22 1250 Temporal     SpO2 11/01/22 1250 100 %     Weight 11/01/22 1251 164 lb 11.2 oz (74.7 kg)     Height 11/01/22 1251 '5\' 9"'$  (1.753 m)     Head Circumference --      Peak Flow --      Pain Score --      Pain Loc --      Pain Edu? --      Excl. in [place]? --     Most recent vital signs: Vitals:   11/01/22 1338 11/01/22 1400  BP: (!) 148/64 (!) 148/65  Pulse: 62 66  Resp: 15 18  Temp:    SpO2: (!) 85% 99%     General: Awake, does appear in some painful distress.  Reports significant pain in his right upper thigh and hip area CV:  Good peripheral perfusion.  Strong palpable dorsalis pedis pulses lower extremities bilateral.  Normal toe wiggle of the feet bilaterally.  Normal heart tones Resp:  Normal effort.  Clear bilaterally without distress Abd:  No distention.  Soft nontender nondistended no obvious pelvic tenderness. Other:  Normocephalic atraumatic.  Denies cervical tenderness.  Denies any head strike or injury.  Notable pain to any movement about the  right hip especially with hip isolated.  No obvious injury to the remainder of the right lower extremity or other extremities bilateral   ED Results / Procedures / Treatments   Labs (all labs ordered are listed, but only abnormal results are displayed) Labs Reviewed  CBC - Abnormal; Notable for the following components:      Result Value   RBC 3.78 (*)    Hemoglobin 11.6 (*)    HCT 35.5 (*)    All other components within normal limits  BASIC METABOLIC PANEL - Abnormal; Notable for the following components:   Sodium 134 (*)    Glucose, Bld 112 (*)    BUN 26 (*)    Calcium 8.7 (*)    All other components within normal limits  TYPE AND SCREEN     EKG  Is interpreted by me at 1250 heart rate 60 QRS 150 QTc 470 Normal sinus rhythm occasional PVC.  No evidence of acute ischemia.  Left bundle branch block morphology   RADIOLOGY   Personally interpreted patient's  right hip x-ray as no obvious fracture though there is a subtle area of potential concern for fracture, and [place]'s line seems slightly abnormal regarding R hip  DG Tibia/Fibula Right  Result Date: 11/01/2022 CLINICAL DATA:  Right leg pain after a fall. EXAM: RIGHT TIBIA AND FIBULA - 2 VIEW COMPARISON:  None Available. FINDINGS: There is no evidence of fracture or other focal bone lesions. Soft tissues are unremarkable. IMPRESSION: Negative. Electronically Signed   By: Misty Stanley M.D.   On: 11/01/2022 14:49   DG Femur Min 2 Views Right  Result Date: 11/01/2022 CLINICAL DATA:  Pain after a fall. EXAM: RIGHT FEMUR 2 VIEWS COMPARISON:  Hip films from earlier the same day. FINDINGS: Frog-leg lateral view of the femur shows apparent nondisplaced basicervical femoral neck fracture with angulation. This is not evident on the AP film and more evident than on the dedicated right hip study performed earlier today raising the question of some interval displacement. Heterogeneous mineralization but noted in the proximal femur in  right pelvis with sclerotic lesions raising concern for metastatic involvement. IMPRESSION: 1. Imaging features highly suspicious for basicervical right femoral neck fracture, occult on AP imaging and seen only on the lateral image. Correlate clinically and CT or MR imaging could be used to be more definitive as clinically warranted. 2. Scattered sclerotic lesions in the right pelvis and proximal right femur concerning for metastatic disease. Electronically Signed   By: Misty Stanley M.D.   On: 11/01/2022 14:48   On regarding concerns for potential metastatic disease, patient's daughters report that he has a known history of prostate cancer that is metastatic and  has consulted with urology and does not wish to undergo any sort of further treatment for prostate cancer   DG Chest Port 1 View  Result Date: 11/01/2022 CLINICAL DATA:  Hip fracture EXAM: PORTABLE CHEST 1 VIEW COMPARISON:  None Available. FINDINGS: The heart size and mediastinal contours are within normal limits. Atherosclerotic calcification of the aortic arch. Both lungs are clear. Diffuse sclerotic metastatic disease of the spine. IMPRESSION: 1. No active disease. 2. Diffuse sclerotic metastatic disease of the spine. Electronically Signed   By: Keane Police D.O.   On: 11/01/2022 13:34   DG Hip Unilat W or Wo Pelvis 2-3 Views Right  Result Date: 11/01/2022 CLINICAL DATA:  Fall, hip pain EXAM: DG HIP (WITH OR WITHOUT PELVIS) 2-3V RIGHT COMPARISON:  None Available. FINDINGS: There is no evidence of hip fracture or dislocation. There are extensive sclerotic lesions throughout the bony pelvis, lower lumbar spine as well as in the proximal femurs suggesting metastatic disease. IMPRESSION: 1. No acute fracture or dislocation. 2. Extensive sclerotic lesions throughout the bony pelvis, lower lumbar spine as well as in the proximal femurs suggesting metastatic disease. Electronically Signed   By: Keane Police D.O.   On: 11/01/2022 13:33       PROCEDURES:  Critical Care performed: No  Procedures   MEDICATIONS ORDERED IN ED: Medications  fentaNYL (SUBLIMAZE) injection 50 mcg (has no administration in time range)  ondansetron (ZOFRAN) injection 4 mg (4 mg Intravenous Given 11/01/22 1255)  morphine (PF) 4 MG/ML injection 4 mg (4 mg Intravenous Given 11/01/22 1255)  fentaNYL (SUBLIMAZE) injection 50 mcg (50 mcg Intravenous Given 11/01/22 1330)  fentaNYL (SUBLIMAZE) injection 25 mcg (25 mcg Intravenous Given 11/01/22 1450)     IMPRESSION / MDM / ASSESSMENT AND PLAN / ED COURSE  I reviewed the triage vital signs and the nursing notes.  Differential diagnosis includes, but is not limited to, injuries suffered from blunt fall, pelvic fracture or hip fracture in particular, or other causation.  Also given the degree of pain imaging of the lumbar spine would appear warranted though he does not complain of obvious low back pain.  No neurovascular deficits.  Fall appears to be mechanical in nature based on clinical history.  Labs reviewed notable for mild anemia  Patient's presentation is most consistent with acute complicated illness / injury requiring diagnostic workup.   The patient is on the cardiac monitor to evaluate for evidence of arrhythmia and/or significant heart rate changes.  Patient requiring high degree of pain medication due to severe pain in the right hip area, monitoring telemetry and pulse oximetry carefully.   Updated Archie Patten, and patient of patient's x-ray results which thus far do not show fracture.  They are understanding and agreeable with plan to proceed with further imaging including CT scan to evaluate for other or potential nondisplaced fracture pelvic fracture etc.  Patient's pain is now well-controlled and is resting comfortably with normal hemodynamics and alertness  ----------------------------------------- 3:18 PM on  11/01/2022 ----------------------------------------- Patient reports right hip pain has severely increased once again.  Will give additional 50 mcg of fentanyl, patient fully alert oriented normal vital signs and oxygen saturation respiratory pattern at this time.  Ongoing care assigned to Dr. Leory Plowman or, follow-up on pending CT imaging.  Anticipate need for admission due to severity of right hip pain and highly suspect a hip fracture is potentially present, but needs further workup  FINAL CLINICAL IMPRESSION(S) / ED DIAGNOSES   Final diagnoses:  Fall, initial encounter  Blunt trauma  Right hip pain     Rx / DC Orders   ED Discharge Orders     None        Note:  This document was prepared using Dragon voice recognition software and may include unintentional dictation errors.   Delman Kitten, MD 11/01/22 [phone]

## 2022-11-01 NOTE — Assessment & Plan Note (Signed)
Patient has a history of paroxysmal atrial fibrillation, currently in sinus rhythm.  He is not on any anticoagulation or rate controlling agents.

## 2022-11-01 NOTE — Progress Notes (Signed)
Patient placed on CPAP with 4L O2 bleed, for HS. Tolerating well.

## 2022-11-01 NOTE — ED Notes (Signed)
Pt was placed on 3 L [place] after Fentanyl was given due to pt's O2 declining to 80% on RA.

## 2022-11-01 NOTE — Assessment & Plan Note (Signed)
-  Continue home CPAP

## 2022-11-01 NOTE — ED Notes (Signed)
Pt to floor.  Pt alert  iv in place

## 2022-11-01 NOTE — H&P (Addendum)
History and Physical    Patient: Jason Murphy:[id number] DOB: Mar 20, 1920 DOA: 11/01/2022 DOS: the patient was seen and examined on 11/01/2022 PCP: Pcp, No  Patient coming from: Assisted living facility  Chief Complaint:  Chief Complaint  Patient presents with   Fall    Right hip pain   HPI: Jason Murphy is a 89 y.o. male with medical history significant of advanced metastatic prostate cancer, CAD, hypertension, hyperlipidemia, OSA, RLS, who presents to the ED due to right hip pain.  Jason Murphy states that he was in his usual state of health when he was walking to get lunch around noon.  He states that he lost his footing and subsequently fell.  His daughter, Jason Murphy, at bedside states that the ALF staff noticed something on the ground that he tripped over.  Prior to the fall, Jason Murphy denies any dizziness, shortness of breath, chest pain, palpitations.  He denies any symptoms after the fall with the exception of severe right hip pain that is predominantly on the medial aspect of the upper thigh.  The pain is exacerbated with any movement.  It does not radiate down into the calf.  He denies hitting his head during the fall or any loss of consciousness.  Per chart review, patient has a history of advanced metastatic, castrate resistant prostate cancer currently on androgen deprivation therapy with Eligard.  His last dose of Eligard was on 03/08/2022.  ED course: On arrival to the ED, patient was hypertensive at 145/65 with heart rate of 59.  He was saturating at 97% on room air but then was subsequently placed on 3 L of supplemental oxygen after desaturating to 85%.  Initial workup remarkable for hemoglobin of 11.6, sodium 134, BUN 26 and creatinine of 0.96.  Imaging was obtained of the hip with evidence of extensive sclerotic lesions throughout the bony pelvis and lower lumbar spine.  Femur x-ray was then obtained that demonstrated a highly suspicious for a right femoral neck fracture.  CT  abdomen/pelvis was then obtained that demonstrated acute intertrochanteric fracture of the right proximal femur in addition to diffuse sclerotic bony metastasis that have increased compared to prior study in addition to a 5.3 x 5.6 cm AAA.  Orthopedic surgery consulted.  TRH consulted for admission.  Review of Systems: As mentioned in the history of present illness. All other systems reviewed and are negative. Past Medical History:  Diagnosis Date   Cancer [facility])    prostate   Coronary artery disease    Dizziness    chronic dizziness   Dyspnea    Fatigue    Progressive fatigue with question of sleep apnea   Hypercholesterolemia    Hypertension    Obstructive sleep apnea    Pneumonia    Restless leg syndrome    Past Surgical History:  Procedure Laterality Date   APPENDECTOMY     CARDIAC CATHETERIZATION     Ejection Fraction was 60%, stent   KNEE SURGERY     arthroscopic left knee surgery   SHOULDER ARTHROSCOPY     Social History:  reports that he has never smoked. He has never used smokeless tobacco. He reports current alcohol use. He reports that he does not use drugs.  No Known Allergies  Family History  Problem Relation Age of Onset   COPD Mother     Prior to Admission medications   Medication Sig Start Date End Date Taking? Authorizing Provider  aspirin EC 81 MG tablet Take 81 mg by  mouth at bedtime.   Yes [provider]  Cholecalciferol (VITAMIN D3) 1000 units CAPS Take 1,000 Units by mouth daily.   Yes [provider]  cyanocobalamin 1000 MCG tablet Take 1,000 mcg by mouth daily.   Yes [provider]  finasteride (PROSCAR) 5 MG tablet Take 5 mg by mouth every evening. 05/14/21  Yes [provider]  levothyroxine (SYNTHROID) 50 MCG tablet Take 50 mcg by mouth daily before breakfast. 10/25/22  Yes [provider]  Multiple Vitamins-Minerals (PRESERVISION AREDS 2 PO) Take 1 tablet by mouth daily.   Yes [provider]   nitroGLYCERIN (NITROSTAT) 0.4 MG SL tablet Place 0.4 mg under the tongue every 5 (five) minutes as needed for chest pain.   Yes [provider]  omeprazole (PRILOSEC) 20 MG capsule Take 20 mg by mouth daily.   Yes [provider]  rOPINIRole (REQUIP) 1 MG tablet Take 1 mg by mouth at bedtime. 03/10/20  Yes [provider]  simvastatin (ZOCOR) 40 MG tablet TAKE 1 TABLET BY MOUTH EVERY NIGHT AT BEDTIME Patient taking differently: Take 40 mg by mouth at bedtime. 05/26/11  Yes Martinique, Peter M, MD  co-enzyme Q-10 50 MG capsule Take 100 mg by mouth every morning. Patient not taking: Reported on 11/01/2022    [provider]  enzalutamide Gillermina Phy) 80 MG tablet Take 2 tablets (160 mg total) by mouth daily. Patient not taking: Reported on 11/01/2022 04/01/22   Hollice Espy, MD  feeding supplement (ENSURE ENLIVE / ENSURE PLUS) LIQD Take 237 mLs by mouth 3 (three) times daily between meals. 01/02/21   Ezekiel Slocumb, DO  pramipexole (MIRAPEX) 0.5 MG tablet Take 0.5 mg by mouth at bedtime. 09/18/17 03/15/20  [provider]    Physical Exam: Vitals:   11/01/22 1400 11/01/22 1530 11/01/22 1600 11/01/22 1724  BP: (!) 148/65 (!) 187/88 (!) 167/72 (!) 168/60  Pulse: 66 67 70 72  Resp: '18 18 11 16  '$ Temp:    98 F (36.7 C)  TempSrc:      SpO2: 99% 99% 99% 97%  Weight:      Height:       Physical Exam Vitals and nursing note reviewed.  Constitutional:      General: He is in acute distress (Secondary to pain).     Appearance: He is normal weight. He is not toxic-appearing.  HENT:     Head: Normocephalic and atraumatic.     Mouth/Throat:     Mouth: Mucous membranes are dry.     Pharynx: Oropharynx is clear.  Eyes:     Conjunctiva/sclera: Conjunctivae normal.     Pupils: Pupils are equal, round, and reactive to light.  Cardiovascular:     Rate and Rhythm: Normal rate and regular rhythm.     Heart sounds: No murmur heard.    No gallop.  Pulmonary:      Effort: Pulmonary effort is normal. No respiratory distress.     Breath sounds: Normal breath sounds. No wheezing, rhonchi or rales.  Abdominal:     General: Bowel sounds are normal. There is no distension.     Palpations: Abdomen is soft.     Tenderness: There is no abdominal tenderness.  Musculoskeletal:        General: Tenderness (Tenderness to palpation on the lateral aspect of the right hip in addition to the mid thigh extending to the medial thigh.) present.     Cervical back: Neck supple.     Right lower  leg: No edema.     Left lower leg: No edema.  Skin:    General: Skin is warm and dry.     Findings: Bruising (Bilateral lower extremity bruising in various stages of healing.) present.  Neurological:     General: No focal deficit present.     Mental Status: He is alert. Mental status is at baseline.     Comments: No focal deficits noted.  Right lower extremity strength reduced in the setting of pain.  No focal weakness noted.  Psychiatric:        Mood and Affect: Mood normal.        Behavior: Behavior normal.    Data Reviewed: CBC with WBC of 5.9, hemoglobin of 11.6, platelets of 239 BMP with sodium 134, potassium 4.0, bicarb 26, glucose 112, BUN 26, creatinine 0.96, calcium of 8.7, anion gap of 6 and GFR above 60.  EKG personally reviewed.  Sinus rhythm with a rate of 61.  Borderline prolonged PR interval consistent with first-degree AV block.  Left bundle branch block noted.  Singular PVC noted.  CT L-SPINE NO CHARGE  Result Date: 11/01/2022 CLINICAL DATA:  Right hip pain after fall. History of prostate cancer. EXAM: CT LUMBAR SPINE WITHOUT CONTRAST TECHNIQUE: Multidetector CT imaging of the lumbar spine was performed without intravenous contrast administration. Multiplanar CT image reconstructions were also generated. RADIATION DOSE REDUCTION: This exam was performed according to the departmental dose-optimization program which includes automated exposure control, adjustment  of the mA and/or kV according to patient size and/or use of iterative reconstruction technique. COMPARISON:  CT abdomen pelvis dated June 03, 2020. FINDINGS: Segmentation: 5 lumbar type vertebrae. Alignment: Unchanged 8 mm anterolisthesis at L5-S1 due to chronic bilateral L5 pars defects. Vertebrae: No acute fracture. Diffuse sclerotic lesions throughout the visualized thoracolumbar spine, ribs, and bony pelvis, progressed since 2021. Paraspinal and other soft tissues: Please see separate CT abdomen and pelvis report from same day. Incompletely visualized infrarenal abdominal aortic aneurysm. 6 mm calculus within a collapsed left renal pelvis. Disc levels: Unchanged diffuse moderate to severe degenerative disc disease mild-to-moderate facet arthropathy. IMPRESSION: 1. No acute fracture or traumatic malalignment of the lumbar spine. 2. Progressive diffuse sclerotic osseous metastatic disease. 3. Unchanged chronic bilateral L5 pars defects with 8 mm anterolisthesis at L5-S1. 4. Unchanged diffuse moderate to severe lumbar spondylosis. 5. Incompletely visualized abdominal aortic aneurysm. Please see separate CT abdomen pelvis report from same day for accurate measurements and follow-up recommendations. Electronically Signed   By: Titus Dubin M.D.   On: 11/01/2022 15:27   CT ABDOMEN PELVIS WO CONTRAST  Result Date: 11/01/2022 CLINICAL DATA:  Abdominal trauma. Blunt fall with pelvic and right leg pain. Patient fell landing on the right side. EXAM: CT ABDOMEN AND PELVIS WITHOUT CONTRAST TECHNIQUE: Multidetector CT imaging of the abdomen and pelvis was performed following the standard protocol without IV contrast. RADIATION DOSE REDUCTION: This exam was performed according to the departmental dose-optimization program which includes automated exposure control, adjustment of the mA and/or kV according to patient size and/or use of iterative reconstruction technique. COMPARISON:  06/03/2020 FINDINGS: Lower chest:  Atelectasis or scarring in the lung bases. Mild basilar bronchiectasis, likely traction. Hepatobiliary: Multiple low-attenuation lesions demonstrated throughout the liver, most subcentimeter in size. Largest is 3.2 cm in diameter. Appearances are similar to previous study, likely representing multiple cysts. Gallbladder is surgically absent. Pancreas: Unremarkable. No pancreatic ductal dilatation or surrounding inflammatory changes. Spleen: Calcified granulomas. Adrenals/Urinary Tract: No adrenal gland nodules. Stone in the  left renal pelvis measuring 8 mm maximal diameter. This is unchanged in position since prior study. No hydronephrosis or hydroureter. Bladder wall is thickened with multiple trabeculations likely representing chronic bladder outlet obstruction. Similar appearance to prior study. Stomach/Bowel: Stomach, small bowel, and colon are not abnormally distended. No wall thickening or inflammatory changes are appreciated. Colonic diverticulosis without evidence of acute diverticulitis. Vascular/Lymphatic: Infrarenal abdominal aortic aneurysm measuring 5.3 cm AP dimension and 5.6 cm transverse dimension. The aneurysm is enlarged since prior study, with previous AP diameter measured at 5 cm. No periaortic stranding. Ectatic and calcified iliac arteries. No significant lymphadenopathy. Reproductive: Prostate gland is decreased in size since prior study. Possibly interval radiation or medical therapy? Other: No free air or free fluid in the abdomen. Minimal periumbilical hernia containing fat. Musculoskeletal: Acute appearing fracture of the right femoral neck extending into the inter trochanteric region with mild impaction of the fracture fragments. Diffuse sclerotic metastases demonstrated throughout the visualized skeleton, increasing since the previous study. Spondylolysis with mild spondylolisthesis at L5-S1. IMPRESSION: 1. Acute inter trochanteric fracture of the right proximal femur. 2. Diffuse  sclerotic bone metastases, increasing since prior study. 3. 5.3 by 5.6 cm infrarenal abdominal aortic aneurysm, increasing since prior study. Recommend referral to a vascular specialist. Reference: J Am Coll Radiol 2956;21:308-657. 4. No evidence of solid organ injury or bowel perforation. Electronically Signed   By: Lucienne Capers M.D.   On: 11/01/2022 15:24   DG Tibia/Fibula Right  Result Date: 11/01/2022 CLINICAL DATA:  Right leg pain after a fall. EXAM: RIGHT TIBIA AND FIBULA - 2 VIEW COMPARISON:  None Available. FINDINGS: There is no evidence of fracture or other focal bone lesions. Soft tissues are unremarkable. IMPRESSION: Negative. Electronically Signed   By: Misty Stanley M.D.   On: 11/01/2022 14:49   DG Femur Min 2 Views Right  Result Date: 11/01/2022 CLINICAL DATA:  Pain after a fall. EXAM: RIGHT FEMUR 2 VIEWS COMPARISON:  Hip films from earlier the same day. FINDINGS: Frog-leg lateral view of the femur shows apparent nondisplaced basicervical femoral neck fracture with angulation. This is not evident on the AP film and more evident than on the dedicated right hip study performed earlier today raising the question of some interval displacement. Heterogeneous mineralization but noted in the proximal femur in right pelvis with sclerotic lesions raising concern for metastatic involvement. IMPRESSION: 1. Imaging features highly suspicious for basicervical right femoral neck fracture, occult on AP imaging and seen only on the lateral image. Correlate clinically and CT or MR imaging could be used to be more definitive as clinically warranted. 2. Scattered sclerotic lesions in the right pelvis and proximal right femur concerning for metastatic disease. Electronically Signed   By: Misty Stanley M.D.   On: 11/01/2022 14:48   DG Chest Port 1 View  Result Date: 11/01/2022 CLINICAL DATA:  Hip fracture EXAM: PORTABLE CHEST 1 VIEW COMPARISON:  None Available. FINDINGS: The heart size and mediastinal  contours are within normal limits. Atherosclerotic calcification of the aortic arch. Both lungs are clear. Diffuse sclerotic metastatic disease of the spine. IMPRESSION: 1. No active disease. 2. Diffuse sclerotic metastatic disease of the spine. Electronically Signed   By: Keane Police D.O.   On: 11/01/2022 13:34   DG Hip Unilat W or Wo Pelvis 2-3 Views Right  Result Date: 11/01/2022 CLINICAL DATA:  Fall, hip pain EXAM: DG HIP (WITH OR WITHOUT PELVIS) 2-3V RIGHT COMPARISON:  None Available. FINDINGS: There is no evidence of hip fracture or dislocation.  There are extensive sclerotic lesions throughout the bony pelvis, lower lumbar spine as well as in the proximal femurs suggesting metastatic disease. IMPRESSION: 1. No acute fracture or dislocation. 2. Extensive sclerotic lesions throughout the bony pelvis, lower lumbar spine as well as in the proximal femurs suggesting metastatic disease. Electronically Signed   By: Keane Police D.O.   On: 11/01/2022 13:33     There are no new results to review at this time.  Assessment and Plan: * Femur fracture [facility]) Patient is presenting after a ground-level fall that was mechanical in nature with CT imaging demonstrating an intertrochanteric fracture.  Patient has evidence of diffuse sclerotic lesions consistent with metastatic prostate cancer.  Discussed with orthopedic surgery; no contraindication to move forward with femur repair.  - Orthopedic surgery consulted; appreciate their recommendations - Plans for the OR tomorrow, 11/02/2022 - Hold DVT prophylaxis after midnight - N.p.o. after midnight - Pain control with Norco, Dilaudid and Robaxin - Postoperative PT/OT - TOC consultation for DME/home health needs - Supplemental oxygen as needed while bedbound and receiving opioids in addition to pulmonary toilet  Essential hypertension Patient has a history of hypertension but is not currently on any medication for this.  Blood pressure today is elevated but  in the setting of severe pain.  - No indication for antihypertensives at this time. - Pain control as noted above  Paroxysmal A-fib [facility]) Patient has a history of paroxysmal atrial fibrillation, currently in sinus rhythm.  He is not on any anticoagulation or rate controlling agents.  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  He is currently on Eligard with last dose in May 2023.  Evidence of progressive sclerotic lesions on imaging today.  - Continue outpatient follow-up with urology  OBSTRUCTIVE SLEEP APNEA - Continue home CPAP  Infrarenal abdominal aortic aneurysm (AAA) without rupture (HCC) On CT imaging today, there was demonstration of a 5.3 x 5.6 cm infrarenal AAA with no evidence of rupture.  Previous imaging was reviewed, with CT in 2021 that demonstrated a 5 cm AAA.  Discussed with vascular surgery, no need for any further evaluation or intervention prior to femur repair tomorrow morning.  - Recommend outpatient follow-up with vascular surgery for further monitoring  Advance Care Planning:   Code Status: DNR/DNI.  Jason Murphy at bedside stated that he would not want to be on any life supportive measures and due to this, he would not want CPR/ACLS or intubation in the setting of cardiac or pulmonary arrest.  He values his independence significantly and would not want to prolong his life if it meant decreasing quality.  He is okay with intubation for surgery purposes only with the understanding that he would not be reintubated after surgery should he develop pulmonary distress.  His daughter Jason Murphy at bedside expressed agreement and this is something they have discussed as a family prior  Consults: Orthopedic surgery  Family Communication: Patient's daughter Jason Murphy updated at bedside  Severity of Illness: The appropriate patient status for this patient is INPATIENT. Inpatient status is judged to be  reasonable and necessary in order to provide the required intensity of service to ensure the patient's safety. The patient's presenting symptoms, physical exam findings, and initial radiographic and laboratory data in the context of their chronic comorbidities is felt to place them at high risk for further clinical deterioration. Furthermore, it is not anticipated that the patient will be medically stable for discharge from the hospital within 2 midnights  of admission.    * I certify that at the point of admission it is my clinical judgment that the patient will require inpatient hospital care spanning beyond 2 midnights from the point of admission due to high intensity of service, high risk for further deterioration and high frequency of surveillance required.*  Author: Jose Persia, MD 11/01/2022 5:27 PM  For on call review [email or web address].

## 2022-11-01 NOTE — Consult Note (Signed)
ORTHOPAEDIC CONSULTATION  REQUESTING PHYSICIAN: Jose Persia, MD  Chief Complaint: Right hip pain status post fall  HPI: Jason Murphy is a 89 y.o. male with history of metastatic prostate cancer who complains of right hip pain after a fall today.  The patient explains he was walking quickly to get to lunch and fell using his walker.  Patient had right hip pain after his fall.  X-ray films and a CT scan were performed of the patient's right hip which revealed a right intertrochanteric hip fracture.  Patient is being admitted to hospital service for preoperative clearance and orthopedics is consulted for management of the patient's fracture.  Patient is ambulatory at baseline with a walker.  Past Medical History:  Diagnosis Date   Cancer [facility])    prostate   Coronary artery disease    Dizziness    chronic dizziness   Dyspnea    Fatigue    Progressive fatigue with question of sleep apnea   Hypercholesterolemia    Hypertension    Obstructive sleep apnea    Pneumonia    Restless leg syndrome    Past Surgical History:  Procedure Laterality Date   APPENDECTOMY     CARDIAC CATHETERIZATION     Ejection Fraction was 60%, stent   KNEE SURGERY     arthroscopic left knee surgery   SHOULDER ARTHROSCOPY     Social History   Socioeconomic History   Marital status: Widowed    Spouse name: Not on file   Number of children: 4   Years of education: Col Grad   Highest education level: Not on file  Occupational History   Occupation: Retired  Tobacco Use   Smoking status: Never   Smokeless tobacco: Never  Vaping Use   Vaping Use: Never used  Substance and Sexual Activity   Alcohol use: Yes    Comment: 1/2 glass wine daily   Drug use: No   Sexual activity: Not on file  Other Topics Concern   Not on file  Social History Narrative   Caffeine Occ cup of coffee.   Social Determinants of Health   Financial Resource Strain: Not on file  Food Insecurity: Not on file   Transportation Needs: Not on file  Physical Activity: Not on file  Stress: Not on file  Social Connections: Not on file   Family History  Problem Relation Age of Onset   COPD Mother    No Known Allergies Prior to Admission medications   Medication Sig Start Date End Date Taking? Authorizing Provider  aspirin EC 81 MG tablet Take 81 mg by mouth at bedtime.   Yes [provider]  Cholecalciferol (VITAMIN D3) 1000 units CAPS Take 1,000 Units by mouth daily.   Yes [provider]  cyanocobalamin 1000 MCG tablet Take 1,000 mcg by mouth daily.   Yes [provider]  finasteride (PROSCAR) 5 MG tablet Take 5 mg by mouth every evening. 05/14/21  Yes [provider]  levothyroxine (SYNTHROID) 50 MCG tablet Take 50 mcg by mouth daily before breakfast. 10/25/22  Yes [provider]  Multiple Vitamins-Minerals (PRESERVISION AREDS 2 PO) Take 1 tablet by mouth daily.   Yes [provider]  nitroGLYCERIN (NITROSTAT) 0.4 MG SL tablet Place 0.4 mg under the tongue every 5 (five) minutes as needed for chest pain.   Yes [provider]  omeprazole (PRILOSEC) 20 MG capsule Take 20 mg by mouth daily.   Yes [provider]  rOPINIRole (REQUIP) 1 MG tablet  Take 1 mg by mouth at bedtime. 03/10/20  Yes [provider]  simvastatin (ZOCOR) 40 MG tablet TAKE 1 TABLET BY MOUTH EVERY NIGHT AT BEDTIME Patient taking differently: Take 40 mg by mouth at bedtime. 05/26/11  Yes Martinique, Peter M, MD  co-enzyme Q-10 50 MG capsule Take 100 mg by mouth every morning. Patient not taking: Reported on 11/01/2022    [provider]  enzalutamide Gillermina Phy) 80 MG tablet Take 2 tablets (160 mg total) by mouth daily. Patient not taking: Reported on 11/01/2022 04/01/22   Hollice Espy, MD  feeding supplement (ENSURE ENLIVE / ENSURE PLUS) LIQD Take 237 mLs by mouth 3 (three) times daily between meals. 01/02/21   Ezekiel Slocumb, DO  pramipexole (MIRAPEX)  0.5 MG tablet Take 0.5 mg by mouth at bedtime. 09/18/17 03/15/20  [provider]   CT L-SPINE NO CHARGE  Result Date: 11/01/2022 CLINICAL DATA:  Right hip pain after fall. History of prostate cancer. EXAM: CT LUMBAR SPINE WITHOUT CONTRAST TECHNIQUE: Multidetector CT imaging of the lumbar spine was performed without intravenous contrast administration. Multiplanar CT image reconstructions were also generated. RADIATION DOSE REDUCTION: This exam was performed according to the departmental dose-optimization program which includes automated exposure control, adjustment of the mA and/or kV according to patient size and/or use of iterative reconstruction technique. COMPARISON:  CT abdomen pelvis dated June 03, 2020. FINDINGS: Segmentation: 5 lumbar type vertebrae. Alignment: Unchanged 8 mm anterolisthesis at L5-S1 due to chronic bilateral L5 pars defects. Vertebrae: No acute fracture. Diffuse sclerotic lesions throughout the visualized thoracolumbar spine, ribs, and bony pelvis, progressed since 2021. Paraspinal and other soft tissues: Please see separate CT abdomen and pelvis report from same day. Incompletely visualized infrarenal abdominal aortic aneurysm. 6 mm calculus within a collapsed left renal pelvis. Disc levels: Unchanged diffuse moderate to severe degenerative disc disease mild-to-moderate facet arthropathy. IMPRESSION: 1. No acute fracture or traumatic malalignment of the lumbar spine. 2. Progressive diffuse sclerotic osseous metastatic disease. 3. Unchanged chronic bilateral L5 pars defects with 8 mm anterolisthesis at L5-S1. 4. Unchanged diffuse moderate to severe lumbar spondylosis. 5. Incompletely visualized abdominal aortic aneurysm. Please see separate CT abdomen pelvis report from same day for accurate measurements and follow-up recommendations. Electronically Signed   By: Titus Dubin M.D.   On: 11/01/2022 15:27   CT ABDOMEN PELVIS WO CONTRAST  Result Date: 11/01/2022 CLINICAL  DATA:  Abdominal trauma. Blunt fall with pelvic and right leg pain. Patient fell landing on the right side. EXAM: CT ABDOMEN AND PELVIS WITHOUT CONTRAST TECHNIQUE: Multidetector CT imaging of the abdomen and pelvis was performed following the standard protocol without IV contrast. RADIATION DOSE REDUCTION: This exam was performed according to the departmental dose-optimization program which includes automated exposure control, adjustment of the mA and/or kV according to patient size and/or use of iterative reconstruction technique. COMPARISON:  06/03/2020 FINDINGS: Lower chest: Atelectasis or scarring in the lung bases. Mild basilar bronchiectasis, likely traction. Hepatobiliary: Multiple low-attenuation lesions demonstrated throughout the liver, most subcentimeter in size. Largest is 3.2 cm in diameter. Appearances are similar to previous study, likely representing multiple cysts. Gallbladder is surgically absent. Pancreas: Unremarkable. No pancreatic ductal dilatation or surrounding inflammatory changes. Spleen: Calcified granulomas. Adrenals/Urinary Tract: No adrenal gland nodules. Stone in the left renal pelvis measuring 8 mm maximal diameter. This is unchanged in position since prior study. No hydronephrosis or hydroureter. Bladder wall is thickened with multiple trabeculations likely representing chronic bladder outlet obstruction. Similar appearance to prior study. Stomach/Bowel: Stomach, small bowel,  and colon are not abnormally distended. No wall thickening or inflammatory changes are appreciated. Colonic diverticulosis without evidence of acute diverticulitis. Vascular/Lymphatic: Infrarenal abdominal aortic aneurysm measuring 5.3 cm AP dimension and 5.6 cm transverse dimension. The aneurysm is enlarged since prior study, with previous AP diameter measured at 5 cm. No periaortic stranding. Ectatic and calcified iliac arteries. No significant lymphadenopathy. Reproductive: Prostate gland is decreased in size  since prior study. Possibly interval radiation or medical therapy? Other: No free air or free fluid in the abdomen. Minimal periumbilical hernia containing fat. Musculoskeletal: Acute appearing fracture of the right femoral neck extending into the inter trochanteric region with mild impaction of the fracture fragments. Diffuse sclerotic metastases demonstrated throughout the visualized skeleton, increasing since the previous study. Spondylolysis with mild spondylolisthesis at L5-S1. IMPRESSION: 1. Acute inter trochanteric fracture of the right proximal femur. 2. Diffuse sclerotic bone metastases, increasing since prior study. 3. 5.3 by 5.6 cm infrarenal abdominal aortic aneurysm, increasing since prior study. Recommend referral to a vascular specialist. Reference: J Am Coll Radiol 6712;[id number]. 4. No evidence of solid organ injury or bowel perforation. Electronically Signed   By: Lucienne Capers M.D.   On: 11/01/2022 15:24   DG Tibia/Fibula Right  Result Date: 11/01/2022 CLINICAL DATA:  Right leg pain after a fall. EXAM: RIGHT TIBIA AND FIBULA - 2 VIEW COMPARISON:  None Available. FINDINGS: There is no evidence of fracture or other focal bone lesions. Soft tissues are unremarkable. IMPRESSION: Negative. Electronically Signed   By: Misty Stanley M.D.   On: 11/01/2022 14:49   DG Femur Min 2 Views Right  Result Date: 11/01/2022 CLINICAL DATA:  Pain after a fall. EXAM: RIGHT FEMUR 2 VIEWS COMPARISON:  Hip films from earlier the same day. FINDINGS: Frog-leg lateral view of the femur shows apparent nondisplaced basicervical femoral neck fracture with angulation. This is not evident on the AP film and more evident than on the dedicated right hip study performed earlier today raising the question of some interval displacement. Heterogeneous mineralization but noted in the proximal femur in right pelvis with sclerotic lesions raising concern for metastatic involvement. IMPRESSION: 1. Imaging features highly  suspicious for basicervical right femoral neck fracture, occult on AP imaging and seen only on the lateral image. Correlate clinically and CT or MR imaging could be used to be more definitive as clinically warranted. 2. Scattered sclerotic lesions in the right pelvis and proximal right femur concerning for metastatic disease. Electronically Signed   By: Misty Stanley M.D.   On: 11/01/2022 14:48   DG Chest Port 1 View  Result Date: 11/01/2022 CLINICAL DATA:  Hip fracture EXAM: PORTABLE CHEST 1 VIEW COMPARISON:  None Available. FINDINGS: The heart size and mediastinal contours are within normal limits. Atherosclerotic calcification of the aortic arch. Both lungs are clear. Diffuse sclerotic metastatic disease of the spine. IMPRESSION: 1. No active disease. 2. Diffuse sclerotic metastatic disease of the spine. Electronically Signed   By: Keane Police D.O.   On: 11/01/2022 13:34   DG Hip Unilat W or Wo Pelvis 2-3 Views Right  Result Date: 11/01/2022 CLINICAL DATA:  Fall, hip pain EXAM: DG HIP (WITH OR WITHOUT PELVIS) 2-3V RIGHT COMPARISON:  None Available. FINDINGS: There is no evidence of hip fracture or dislocation. There are extensive sclerotic lesions throughout the bony pelvis, lower lumbar spine as well as in the proximal femurs suggesting metastatic disease. IMPRESSION: 1. No acute fracture or dislocation. 2. Extensive sclerotic lesions throughout the bony pelvis, lower lumbar spine as well  as in the proximal femurs suggesting metastatic disease. Electronically Signed   By: Keane Police D.O.   On: 11/01/2022 13:33    Positive ROS: All other systems have been reviewed and were otherwise negative with the exception of those mentioned in the HPI and as above.  Physical Exam: General: Alert, no acute distress  MUSCULOSKELETAL: Right hip: Patient skin is intact.  There is no erythema ecchymosis or significant swelling.  His thigh and leg compartments are soft and compressible.  He has palpable pedal  pulses, intact sensation light touch intact motor function distally.  He has slight external rotation but no normal shortening.  Assessment: Right intertrochanteric hip fracture  Plan: I explained to the patient and his daughter by phone the nature of his fracture.  He has an intertrochanteric right hip fracture which is minimally displaced and was seen on CT scan but not his plain films.  Patient is active and ambulatory at baseline and therefore I have recommended intramedullary fixation for his fracture.  His CT scan does indicate the patient has potential metastatic lesions of his proximal femur and therefore I would recommend a long intramedullary rod.  I discussed the details of the operation as well as the postoperative course.  I answered all rashes by the patient and his daughter during our phone conversation.  They are both in agreement with the plan for surgery.  I discussed the risks and benefits of surgery. The risks include but are not limited to infection, bleeding requiring blood transfusion, nerve or blood vessel injury, joint stiffness or loss of motion, persistent pain, weakness or instability, malunion, nonunion leg length discrepancy, change in lower extremity rotation  hardware failure and the need for further surgery. Medical risks include but are not limited to DVT and pulmonary embolism, myocardial infarction, stroke, pneumonia, respiratory failure and death. Patient understood these risks and wished to proceed.   She will be n.p.o. after midnight.  The order for consent is on the chart.  I have reviewed the patient's x-rays and CT scan in preparation for this case.  Patient surgery is currently scheduled for 1:30 PM tomorrow afternoon pending medical clearance.  Should should not receive any anticoagulation therapy after midnight tonight.    Thornton Park, MD    11/01/2022 5:42 PM

## 2022-11-01 NOTE — Assessment & Plan Note (Signed)
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  He is currently on Eligard with last dose in May 2023.  Evidence of progressive sclerotic lesions on imaging today.  - Continue outpatient follow-up with urology

## 2022-11-02 ENCOUNTER — Inpatient Hospital Stay: Payer: Medicare HMO | Admitting: Anesthesiology

## 2022-11-02 ENCOUNTER — Inpatient Hospital Stay: Payer: Medicare HMO

## 2022-11-02 ENCOUNTER — Other Ambulatory Visit: Payer: Self-pay

## 2022-11-02 ENCOUNTER — Encounter
Admission: EM | Disposition: A | Discharge status: Skilled Nursing Facility | Payer: Self-pay | Source: Skilled Nursing Facility | Attending: Internal Medicine

## 2022-11-02 DIAGNOSIS — S72144A Nondisplaced intertrochanteric fracture of right femur, initial encounter for closed fracture: Secondary | ICD-10-CM | POA: Diagnosis not present

## 2022-11-02 HISTORY — PX: INTRAMEDULLARY (IM) NAIL INTERTROCHANTERIC: SHX5875

## 2022-11-02 LAB — BASIC METABOLIC PANEL
Anion gap: 9 (ref 5–15)
BUN: 23 mg/dL (ref 8–23)
CO2: 23 mmol/L (ref 22–32)
Calcium: 8.3 mg/dL — ABNORMAL LOW (ref 8.9–10.3)
Chloride: 102 mmol/L (ref 98–111)
Creatinine, Ser: 0.83 mg/dL (ref 0.61–1.24)
GFR, Estimated: >60 mL/min (ref >60)
Glucose, Bld: 120 mg/dL — ABNORMAL HIGH (ref 70–99)
Potassium: 4 mmol/L (ref 3.5–5.1)
Sodium: 134 mmol/L — ABNORMAL LOW (ref 135–145)

## 2022-11-02 LAB — CBC
HCT: 35.9 % — ABNORMAL LOW (ref 39.0–52.0)
Hemoglobin: 11.7 g/dL — ABNORMAL LOW (ref 13.0–17.0)
MCH: 30.9 pg (ref 26.0–34.0)
MCHC: 32.6 g/dL (ref 30.0–36.0)
MCV: 94.7 fL (ref 80.0–100.0)
Platelets: 193 10*3/uL (ref 150–400)
RBC: 3.79 MIL/uL — ABNORMAL LOW (ref 4.22–5.81)
RDW: 13.4 % (ref 11.5–15.5)
WBC: 7.5 10*3/uL (ref 4.0–10.5)
nRBC: 0 % (ref 0.0–0.2)

## 2022-11-02 SURGERY — FIXATION, FRACTURE, INTERTROCHANTERIC, WITH INTRAMEDULLARY ROD
Anesthesia: Spinal | Site: Hip | Laterality: Right

## 2022-11-02 MED ORDER — TRAMADOL HCL 50 MG PO TABS
50.0000 mg | ORAL_TABLET | Freq: Four times a day (QID) | ORAL | Status: DC
  Administered 2022-11-03 – 2022-11-10 (×26): 50 mg via ORAL
  Filled 2022-11-02 (×27): qty 1

## 2022-11-02 MED ORDER — METOPROLOL TARTRATE 5 MG/5ML IV SOLN
INTRAVENOUS | Status: AC
  Administered 2022-11-02: 2.5 mg via INTRAVENOUS
  Filled 2022-11-02: qty 5

## 2022-11-02 MED ORDER — ONDANSETRON HCL 4 MG/2ML IJ SOLN: 4.0000 mg | Freq: Four times a day (QID) | INTRAMUSCULAR | Status: DC | PRN

## 2022-11-02 MED ORDER — LACTATED RINGERS IV BOLUS
250.0000 mL | Freq: Once | INTRAVENOUS | Status: AC
  Administered 2022-11-02: 250 mL via INTRAVENOUS

## 2022-11-02 MED ORDER — MORPHINE SULFATE (PF) 2 MG/ML IV SOLN: 0.5000 mg | INTRAVENOUS | Status: DC | PRN

## 2022-11-02 MED ORDER — ALBUMIN HUMAN 5 % IV SOLN: INTRAVENOUS | Status: DC | PRN

## 2022-11-02 MED ORDER — CEFAZOLIN SODIUM-DEXTROSE 2-4 GM/100ML-% IV SOLN
2.0000 g | Freq: Four times a day (QID) | INTRAVENOUS | Status: AC
  Administered 2022-11-02: 2 g via INTRAVENOUS
  Filled 2022-11-02 (×2): qty 100

## 2022-11-02 MED ORDER — HYDROCODONE-ACETAMINOPHEN 5-325 MG PO TABS
1.0000 | ORAL_TABLET | ORAL | Status: DC | PRN
  Administered 2022-11-07: 1 via ORAL
  Filled 2022-11-02 (×2): qty 1

## 2022-11-02 MED ORDER — DOCUSATE SODIUM 100 MG PO CAPS
100.0000 mg | ORAL_CAPSULE | Freq: Two times a day (BID) | ORAL | Status: DC
  Administered 2022-11-03 – 2022-11-10 (×13): 100 mg via ORAL
  Filled 2022-11-02 (×15): qty 1

## 2022-11-02 MED ORDER — POTASSIUM CHLORIDE IN NACL 20-0.45 MEQ/L-% IV SOLN
INTRAVENOUS | Status: DC
  Filled 2022-11-02 (×4): qty 1000

## 2022-11-02 MED ORDER — PHENYLEPHRINE HCL-NACL 20-0.9 MG/250ML-% IV SOLN
INTRAVENOUS | Status: DC | PRN
  Administered 2022-11-02: 35 ug/min via INTRAVENOUS

## 2022-11-02 MED ORDER — KETAMINE HCL 50 MG/5ML IJ SOSY
PREFILLED_SYRINGE | INTRAMUSCULAR | Status: AC
  Filled 2022-11-02: qty 5

## 2022-11-02 MED ORDER — ENOXAPARIN SODIUM 40 MG/0.4ML IJ SOSY
40.0000 mg | PREFILLED_SYRINGE | INTRAMUSCULAR | Status: DC
  Administered 2022-11-04 – 2022-11-10 (×7): 40 mg via SUBCUTANEOUS
  Filled 2022-11-02 (×7): qty 0.4

## 2022-11-02 MED ORDER — 0.9 % SODIUM CHLORIDE (POUR BTL) OPTIME
TOPICAL | Status: DC | PRN
  Administered 2022-11-02: 500 mL

## 2022-11-02 MED ORDER — FINASTERIDE 5 MG PO TABS
5.0000 mg | ORAL_TABLET | Freq: Every evening | ORAL | Status: DC
  Administered 2022-11-03 – 2022-11-09 (×7): 5 mg via ORAL
  Filled 2022-11-02 (×7): qty 1

## 2022-11-02 MED ORDER — BISACODYL 10 MG RE SUPP: 10.0000 mg | Freq: Every day | RECTAL | Status: DC | PRN

## 2022-11-02 MED ORDER — ACETAMINOPHEN 500 MG PO TABS
500.0000 mg | ORAL_TABLET | Freq: Four times a day (QID) | ORAL | Status: AC
  Filled 2022-11-02: qty 1

## 2022-11-02 MED ORDER — PHENOL 1.4 % MT LIQD: 1.0000 | OROMUCOSAL | Status: DC | PRN

## 2022-11-02 MED ORDER — PHENYLEPHRINE HCL-NACL 20-0.9 MG/250ML-% IV SOLN
INTRAVENOUS | Status: AC
  Filled 2022-11-02: qty 250

## 2022-11-02 MED ORDER — KETAMINE HCL 10 MG/ML IJ SOLN
INTRAMUSCULAR | Status: DC | PRN
  Administered 2022-11-02: 10 mg via INTRAVENOUS

## 2022-11-02 MED ORDER — ALUM & MAG HYDROXIDE-SIMETH 200-200-20 MG/5ML PO SUSP: 30.0000 mL | ORAL | Status: DC | PRN

## 2022-11-02 MED ORDER — BUPIVACAINE-EPINEPHRINE (PF) 0.5% -1:200000 IJ SOLN
INTRAMUSCULAR | Status: DC | PRN
  Administered 2022-11-02: 30 mL

## 2022-11-02 MED ORDER — ACETAMINOPHEN 325 MG PO TABS
325.0000 mg | ORAL_TABLET | Freq: Four times a day (QID) | ORAL | Status: DC | PRN
  Administered 2022-11-06: 650 mg via ORAL
  Administered 2022-11-10: 325 mg via ORAL
  Filled 2022-11-02: qty 1
  Filled 2022-11-02: qty 2

## 2022-11-02 MED ORDER — ACETAMINOPHEN 10 MG/ML IV SOLN: 1000.0000 mg | Freq: Once | INTRAVENOUS | Status: DC | PRN

## 2022-11-02 MED ORDER — FENTANYL CITRATE (PF) 100 MCG/2ML IJ SOLN
INTRAMUSCULAR | Status: AC
  Filled 2022-11-02: qty 2

## 2022-11-02 MED ORDER — PROPOFOL 10 MG/ML IV BOLUS
INTRAVENOUS | Status: DC | PRN
  Administered 2022-11-02: 40 mg via INTRAVENOUS

## 2022-11-02 MED ORDER — METOPROLOL TARTRATE 5 MG/5ML IV SOLN: 2.5000 mg | Freq: Once | INTRAVENOUS | Status: AC

## 2022-11-02 MED ORDER — PHENYLEPHRINE HCL (PRESSORS) 10 MG/ML IV SOLN
INTRAVENOUS | Status: AC
  Filled 2022-11-02: qty 1

## 2022-11-02 MED ORDER — FENTANYL CITRATE (PF) 100 MCG/2ML IJ SOLN
INTRAMUSCULAR | Status: DC | PRN
  Administered 2022-11-02 (×2): 25 ug via INTRAVENOUS
  Administered 2022-11-02: 50 ug via INTRAVENOUS

## 2022-11-02 MED ORDER — ALBUMIN HUMAN 5 % IV SOLN
INTRAVENOUS | Status: AC
  Filled 2022-11-02: qty 500

## 2022-11-02 MED ORDER — LACTATED RINGERS IV SOLN: INTRAVENOUS | Status: DC | PRN

## 2022-11-02 MED ORDER — HYDROMORPHONE HCL 1 MG/ML IJ SOLN: 0.2500 mg | INTRAMUSCULAR | Status: DC | PRN

## 2022-11-02 MED ORDER — ONDANSETRON HCL 4 MG PO TABS: 4.0000 mg | ORAL_TABLET | Freq: Four times a day (QID) | ORAL | Status: DC | PRN

## 2022-11-02 MED ORDER — SENNA 8.6 MG PO TABS
1.0000 | ORAL_TABLET | Freq: Two times a day (BID) | ORAL | Status: DC
  Administered 2022-11-03 – 2022-11-10 (×13): 8.6 mg via ORAL
  Filled 2022-11-02 (×15): qty 1

## 2022-11-02 MED ORDER — PROMETHAZINE HCL 25 MG/ML IJ SOLN: 6.2500 mg | INTRAMUSCULAR | Status: DC | PRN

## 2022-11-02 MED ORDER — PHENYLEPHRINE 80 MCG/ML (10ML) SYRINGE FOR IV PUSH (FOR BLOOD PRESSURE SUPPORT)
PREFILLED_SYRINGE | INTRAVENOUS | Status: DC | PRN
  Administered 2022-11-02: 80 ug via INTRAVENOUS

## 2022-11-02 MED ORDER — BUPIVACAINE-EPINEPHRINE (PF) 0.5% -1:200000 IJ SOLN
INTRAMUSCULAR | Status: AC
  Filled 2022-11-02: qty 30

## 2022-11-02 MED ORDER — MENTHOL 3 MG MT LOZG: 1.0000 | LOZENGE | OROMUCOSAL | Status: DC | PRN

## 2022-11-02 SURGICAL SUPPLY — 49 items
BIT DRILL 4.3MMS DISTAL GRDTED (BIT) IMPLANT
BIT DRILL LAG SCREW (DRILL) IMPLANT
BNDG CMPR 5X6 CHSV STRCH STRL (GAUZE/BANDAGES/DRESSINGS) ×2
BNDG COHESIVE 6X5 TAN ST LF (GAUZE/BANDAGES/DRESSINGS) ×2 IMPLANT
CORTICAL BONE SCR 5.0MM X 46MM (Screw) ×1 IMPLANT
DRAPE 3/4 80X56 (DRAPES) ×2 IMPLANT
DRAPE SURG 17X11 SM STRL (DRAPES) ×2 IMPLANT
DRAPE U-SHAPE 47X51 STRL (DRAPES) ×2 IMPLANT
DRILL 4.3MMS DISTAL GRADUATED (BIT) ×1
DRILL LAG SCREW (DRILL) ×1
DRSG OPSITE POSTOP 3X4 (GAUZE/BANDAGES/DRESSINGS) ×2 IMPLANT
DRSG OPSITE POSTOP 4X14 (GAUZE/BANDAGES/DRESSINGS) IMPLANT
DRSG OPSITE POSTOP 4X6 (GAUZE/BANDAGES/DRESSINGS) ×1 IMPLANT
DURAPREP 26ML APPLICATOR (WOUND CARE) ×2 IMPLANT
ELECT REM PT RETURN 9FT ADLT (ELECTROSURGICAL) ×1
ELECTRODE REM PT RTRN 9FT ADLT (ELECTROSURGICAL) ×1 IMPLANT
GLOVE BIOGEL PI IND STRL 9 (GLOVE) ×1 IMPLANT
GLOVE BIOGEL PI ORTHO SZ9 (GLOVE) ×4 IMPLANT
GOWN STRL REUS TWL 2XL XL LVL4 (GOWN DISPOSABLE) ×1 IMPLANT
GOWN STRL REUS W/ TWL LRG LVL3 (GOWN DISPOSABLE) ×1 IMPLANT
GOWN STRL REUS W/TWL LRG LVL3 (GOWN DISPOSABLE) ×1
GUIDEPIN VERSANAIL DSP 3.2X444 (ORTHOPEDIC DISPOSABLE SUPPLIES) IMPLANT
GUIDEWIRE BALL NOSE 100CM (WIRE) IMPLANT
HEMOVAC 400CC 10FR (MISCELLANEOUS) IMPLANT
KIT TURNOVER CYSTO (KITS) ×1 IMPLANT
MANIFOLD NEPTUNE II (INSTRUMENTS) ×1 IMPLANT
MAT ABSORB  FLUID 56X50 GRAY (MISCELLANEOUS) ×1
MAT ABSORB FLUID 56X50 GRAY (MISCELLANEOUS) ×1 IMPLANT
NAIL HIP FRAC RT 130 11MX400M (Nail) IMPLANT
NDL HYPO 21X1.5 SAFETY (NEEDLE) IMPLANT
NEEDLE HYPO 21X1.5 SAFETY (NEEDLE) ×1 IMPLANT
NS IRRIG 500ML POUR BTL (IV SOLUTION) ×1 IMPLANT
PACK HIP COMPR (MISCELLANEOUS) ×1 IMPLANT
PAD ARMBOARD 7.5X6 YLW CONV (MISCELLANEOUS) ×1 IMPLANT
SCREW BONE CORTICAL 5.0X50 (Screw) IMPLANT
SCREW BONE CORTICAL 5.0X54 (Screw) IMPLANT
SCREW CORTICL BON 5.0MM X 46MM (Screw) IMPLANT
SCREW LAG 10.5MMX105MM HFN (Screw) IMPLANT
STAPLER SKIN PROX 35W (STAPLE) ×1 IMPLANT
SUCTION FRAZIER HANDLE 10FR (MISCELLANEOUS) ×1
SUCTION TUBE FRAZIER 10FR DISP (MISCELLANEOUS) ×1 IMPLANT
SUT VIC AB 0 CT1 36 (SUTURE) ×2 IMPLANT
SUT VIC AB 2-0 CT1 27 (SUTURE) ×1
SUT VIC AB 2-0 CT1 TAPERPNT 27 (SUTURE) ×1 IMPLANT
SUT VICRYL 0 UR6 27IN ABS (SUTURE) ×1 IMPLANT
SYR 30ML LL (SYRINGE) ×1 IMPLANT
TRAP FLUID SMOKE EVACUATOR (MISCELLANEOUS) ×1 IMPLANT
TRAY FOLEY SLVR 16FR LF STAT (SET/KITS/TRAYS/PACK) IMPLANT
WATER STERILE IRR 500ML POUR (IV SOLUTION) ×1 IMPLANT

## 2022-11-02 NOTE — Progress Notes (Signed)
PROGRESS NOTE  Jason Murphy YTK:[id number] DOB: 06/28/1920 DOA: 11/01/2022 PCP: Pcp, No  Hospital Course/Subjective: Jason Murphy is a 89 y.o. male with medical history significant of advanced metastatic prostate cancer, CAD, hypertension, hyperlipidemia, OSA, RLS, who presents to the ED due to right hip pain. He apparently tripped on something in the hall of his ALF while ambulating with his walker and unfortunately suffered right femoral neck fracture. He was seen by orthopedic surgery and they plan surgical repair later today.   Assessment/Plan:  Principal Problem:   Femur fracture (HCC) Active Problems:   Essential hypertension   Paroxysmal A-fib (HCC)   PROSTATE CANCER   OBSTRUCTIVE SLEEP APNEA   Infrarenal abdominal aortic aneurysm (AAA) without rupture (HCC)   Assessment and Plan: * Femur fracture (HCC) Patient is presenting after a ground-level fall that was mechanical in nature with CT imaging demonstrating an intertrochanteric fracture.  Patient has evidence of diffuse sclerotic lesions consistent with metastatic prostate cancer.  No contraindication to move forward with femur repair. - Orthopedic surgery consulted; appreciate their recommendations - Plans for the OR today, 11/02/2022 - Hold DVT prophylaxis after midnight - he has been N.p.o. after midnight - Pain control with Norco, Dilaudid and Robaxin - Postoperative PT/OT - TOC consultation for DME/home health needs - Supplemental oxygen as needed while bedbound and receiving opioids in addition to pulmonary toilet  Essential hypertension Patient has a history of hypertension but is not currently on any medication for this.  Blood pressure initially elevated due to pain but is now back to normal range. - No indication for antihypertensives at this time. - Pain control as noted above  Paroxysmal A-fib [facility]) Patient has a history of paroxysmal atrial fibrillation, currently in sinus rhythm.  He is not on any  anticoagulation or rate controlling agents.  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  He is currently on Eligard with last dose in May 2023.  Evidence of progressive sclerotic lesions on imaging today. - Continue outpatient follow-up with urology  OBSTRUCTIVE SLEEP APNEA - Continue home CPAP  Infrarenal abdominal aortic aneurysm (AAA) without rupture (HCC) On CT imaging 1/16, there was demonstration of a 5.3 x 5.6 cm infrarenal AAA with no evidence of rupture.  Previous imaging was reviewed, with CT in 2021 that demonstrated a 5 cm AAA.  Admitting MD discussed with vascular surgery, no need for any further evaluation or intervention prior to femur repair tomorrow morning. - Recommend outpatient follow-up with vascular surgery for further monitoring  DVT Prophylaxis: SCDs, postop DVT ppx per orthopedic surgery  Code Status: DNR   Family Communication: None presents, previous MD talked with daughter at the time of admission.   Disposition Plan: Pending surgery.   Consultants: Orthopedic Surgery   Procedures: Plan ORIF 1/17   Antimicrobials: Anti-infectives (From admission, onward)    Start     Dose/Rate Route Frequency Ordered Stop   11/02/22 0000  ceFAZolin (ANCEF) IVPB 2g/100 mL premix        2 g 200 mL/hr over 30 Minutes Intravenous 30 min pre-op 11/01/22 1802         Objective: Vitals:   11/01/22 2331 11/02/22 0001 11/02/22 0523 11/02/22 0810  BP:  (!) 110/59 (!) 145/56 120/63  Pulse:  (!) 59 61 69  Resp:  '18 16 18  '$ Temp:  99.2 F (37.3 C) 97.8 F (36.6 C) 97.6 F (36.4 C)  TempSrc:  Oral  Oral  SpO2: 96%  99% 98% 100%  Weight:      Height:        Intake/Output Summary (Last 24 hours) at 11/02/2022 1049 Last data filed at 11/02/2022 0530 Gross per 24 hour  Intake --  Output 300 ml  Net -300 ml   Filed Weights   11/01/22 1251  Weight: 74.7 kg   Exam: General:  Alert, oriented, calm,  in no acute distress, sleeping on arrival Eyes: EOMI, clear sclerea Neck: supple, no masses, trachea mildline  Cardiovascular: RRR, no murmurs or rubs, no peripheral edema  Respiratory: clear to auscultation bilaterally, no wheezes, no crackles  Abdomen: soft, nontender, nondistended, normal bowel tones heard  Skin: dry, no rashes  Musculoskeletal: no joint effusions, normal range of motion  Psychiatric: appropriate affect, normal speech  Neurologic: extraocular muscles intact, clear speech, moving all extremities with intact sensorium   Data Reviewed: CBC: Recent Labs  Lab 11/01/22 1249 11/02/22 0422  WBC 5.9 7.5  HGB 11.6* 11.7*  HCT 35.5* 35.9*  MCV 93.9 94.7  PLT 239 341   Basic Metabolic Panel: Recent Labs  Lab 11/01/22 1249 11/02/22 0422  NA 134* 134*  K 4.0 4.0  CL 102 102  CO2 26 23  GLUCOSE 112* 120*  BUN 26* 23  CREATININE 0.96 0.83  CALCIUM 8.7* 8.3*   GFR: Estimated Creatinine Clearance: 47.3 mL/min (by C-G formula based on SCr of 0.83 mg/dL). Liver Function Tests: No results for input(s): "AST", "ALT", "ALKPHOS", "BILITOT", "PROT", "ALBUMIN" in the last 168 hours. No results for input(s): "LIPASE", "AMYLASE" in the last 168 hours. No results for input(s): "AMMONIA" in the last 168 hours. Coagulation Profile: No results for input(s): "INR", "PROTIME" in the last 168 hours. Cardiac Enzymes: No results for input(s): "CKTOTAL", "CKMB", "CKMBINDEX", "TROPONINI" in the last 168 hours. BNP (last 3 results) No results for input(s): "PROBNP" in the last 8760 hours. HbA1C: No results for input(s): "HGBA1C" in the last 72 hours. CBG: No results for input(s): "GLUCAP" in the last 168 hours. Lipid Profile: No results for input(s): "CHOL", "HDL", "LDLCALC", "TRIG", "CHOLHDL", "LDLDIRECT" in the last 72 hours. Thyroid Function Tests: No results for input(s): "TSH", "T4TOTAL", "FREET4", "T3FREE", "THYROIDAB" in the last 72 hours. Anemia Panel: No results for  input(s): "VITAMINB12", "FOLATE", "FERRITIN", "TIBC", "IRON", "RETICCTPCT" in the last 72 hours. Urine analysis:    Component Value Date/Time   COLORURINE YELLOW (A) 02/09/2021 1256   APPEARANCEUR Cloudy (A) 10/27/2021 1338   LABSPEC 1.038 (H) 02/09/2021 1256   LABSPEC 1.015 01/02/2012 2312   PHURINE 6.0 02/09/2021 1256   GLUCOSEU Negative 10/27/2021 1338   GLUCOSEU Negative 01/02/2012 2312   HGBUR NEGATIVE 02/09/2021 1256   BILIRUBINUR Negative 10/27/2021 1338   BILIRUBINUR Negative 01/02/2012 [place] 02/09/2021 1256   PROTEINUR Trace (A) 10/27/2021 1338   PROTEINUR NEGATIVE 02/09/2021 1256   NITRITE Positive (A) 10/27/2021 1338   NITRITE POSITIVE (A) 02/09/2021 1256   LEUKOCYTESUR 1+ (A) 10/27/2021 1338   LEUKOCYTESUR LARGE (A) 02/09/2021 1256   LEUKOCYTESUR Negative 01/02/2012 2312   Sepsis Labs: '@LABRCNTIP'$ (procalcitonin:4,lacticidven:4)  )No results found for this or any previous visit (from the past 240 hour(s)).   Studies: CT L-SPINE NO CHARGE  Result Date: 11/01/2022 CLINICAL DATA:  Right hip pain after fall. History of prostate cancer. EXAM: CT LUMBAR SPINE WITHOUT CONTRAST TECHNIQUE: Multidetector CT imaging of the lumbar spine was performed without intravenous contrast administration. Multiplanar CT image reconstructions were also generated. RADIATION DOSE REDUCTION: This exam was performed according to  the departmental dose-optimization program which includes automated exposure control, adjustment of the mA and/or kV according to patient size and/or use of iterative reconstruction technique. COMPARISON:  CT abdomen pelvis dated June 03, 2020. FINDINGS: Segmentation: 5 lumbar type vertebrae. Alignment: Unchanged 8 mm anterolisthesis at L5-S1 due to chronic bilateral L5 pars defects. Vertebrae: No acute fracture. Diffuse sclerotic lesions throughout the visualized thoracolumbar spine, ribs, and bony pelvis, progressed since 2021. Paraspinal and other soft  tissues: Please see separate CT abdomen and pelvis report from same day. Incompletely visualized infrarenal abdominal aortic aneurysm. 6 mm calculus within a collapsed left renal pelvis. Disc levels: Unchanged diffuse moderate to Murphy degenerative disc disease mild-to-moderate facet arthropathy. IMPRESSION: 1. No acute fracture or traumatic malalignment of the lumbar spine. 2. Progressive diffuse sclerotic osseous metastatic disease. 3. Unchanged chronic bilateral L5 pars defects with 8 mm anterolisthesis at L5-S1. 4. Unchanged diffuse moderate to Murphy lumbar spondylosis. 5. Incompletely visualized abdominal aortic aneurysm. Please see separate CT abdomen pelvis report from same day for accurate measurements and follow-up recommendations. Electronically Signed   By: Titus Dubin M.D.   On: 11/01/2022 15:27   CT ABDOMEN PELVIS WO CONTRAST  Result Date: 11/01/2022 CLINICAL DATA:  Abdominal trauma. Blunt fall with pelvic and right leg pain. Patient fell landing on the right side. EXAM: CT ABDOMEN AND PELVIS WITHOUT CONTRAST TECHNIQUE: Multidetector CT imaging of the abdomen and pelvis was performed following the standard protocol without IV contrast. RADIATION DOSE REDUCTION: This exam was performed according to the departmental dose-optimization program which includes automated exposure control, adjustment of the mA and/or kV according to patient size and/or use of iterative reconstruction technique. COMPARISON:  06/03/2020 FINDINGS: Lower chest: Atelectasis or scarring in the lung bases. Mild basilar bronchiectasis, likely traction. Hepatobiliary: Multiple low-attenuation lesions demonstrated throughout the liver, most subcentimeter in size. Largest is 3.2 cm in diameter. Appearances are similar to previous study, likely representing multiple cysts. Gallbladder is surgically absent. Pancreas: Unremarkable. No pancreatic ductal dilatation or surrounding inflammatory changes. Spleen: Calcified granulomas.  Adrenals/Urinary Tract: No adrenal gland nodules. Stone in the left renal pelvis measuring 8 mm maximal diameter. This is unchanged in position since prior study. No hydronephrosis or hydroureter. Bladder wall is thickened with multiple trabeculations likely representing chronic bladder outlet obstruction. Similar appearance to prior study. Stomach/Bowel: Stomach, small bowel, and colon are not abnormally distended. No wall thickening or inflammatory changes are appreciated. Colonic diverticulosis without evidence of acute diverticulitis. Vascular/Lymphatic: Infrarenal abdominal aortic aneurysm measuring 5.3 cm AP dimension and 5.6 cm transverse dimension. The aneurysm is enlarged since prior study, with previous AP diameter measured at 5 cm. No periaortic stranding. Ectatic and calcified iliac arteries. No significant lymphadenopathy. Reproductive: Prostate gland is decreased in size since prior study. Possibly interval radiation or medical therapy? Other: No free air or free fluid in the abdomen. Minimal periumbilical hernia containing fat. Musculoskeletal: Acute appearing fracture of the right femoral neck extending into the inter trochanteric region with mild impaction of the fracture fragments. Diffuse sclerotic metastases demonstrated throughout the visualized skeleton, increasing since the previous study. Spondylolysis with mild spondylolisthesis at L5-S1. IMPRESSION: 1. Acute inter trochanteric fracture of the right proximal femur. 2. Diffuse sclerotic bone metastases, increasing since prior study. 3. 5.3 by 5.6 cm infrarenal abdominal aortic aneurysm, increasing since prior study. Recommend referral to a vascular specialist. Reference: J Am Coll Radiol 4580;99:[id number]. 4. No evidence of solid organ injury or bowel perforation. Electronically Signed   By: Lucienne Capers M.D.   On:  11/01/2022 15:24   DG Tibia/Fibula Right  Result Date: 11/01/2022 CLINICAL DATA:  Right leg pain after a fall. EXAM: RIGHT  TIBIA AND FIBULA - 2 VIEW COMPARISON:  None Available. FINDINGS: There is no evidence of fracture or other focal bone lesions. Soft tissues are unremarkable. IMPRESSION: Negative. Electronically Signed   By: Misty Stanley M.D.   On: 11/01/2022 14:49   DG Femur Min 2 Views Right  Result Date: 11/01/2022 CLINICAL DATA:  Pain after a fall. EXAM: RIGHT FEMUR 2 VIEWS COMPARISON:  Hip films from earlier the same day. FINDINGS: Frog-leg lateral view of the femur shows apparent nondisplaced basicervical femoral neck fracture with angulation. This is not evident on the AP film and more evident than on the dedicated right hip study performed earlier today raising the question of some interval displacement. Heterogeneous mineralization but noted in the proximal femur in right pelvis with sclerotic lesions raising concern for metastatic involvement. IMPRESSION: 1. Imaging features highly suspicious for basicervical right femoral neck fracture, occult on AP imaging and seen only on the lateral image. Correlate clinically and CT or MR imaging could be used to be more definitive as clinically warranted. 2. Scattered sclerotic lesions in the right pelvis and proximal right femur concerning for metastatic disease. Electronically Signed   By: Misty Stanley M.D.   On: 11/01/2022 14:48   DG Chest Port 1 View  Result Date: 11/01/2022 CLINICAL DATA:  Hip fracture EXAM: PORTABLE CHEST 1 VIEW COMPARISON:  None Available. FINDINGS: The heart size and mediastinal contours are within normal limits. Atherosclerotic calcification of the aortic arch. Both lungs are clear. Diffuse sclerotic metastatic disease of the spine. IMPRESSION: 1. No active disease. 2. Diffuse sclerotic metastatic disease of the spine. Electronically Signed   By: Keane Police D.O.   On: 11/01/2022 13:34   DG Hip Unilat W or Wo Pelvis 2-3 Views Right  Result Date: 11/01/2022 CLINICAL DATA:  Fall, hip pain EXAM: DG HIP (WITH OR WITHOUT PELVIS) 2-3V RIGHT  COMPARISON:  None Available. FINDINGS: There is no evidence of hip fracture or dislocation. There are extensive sclerotic lesions throughout the bony pelvis, lower lumbar spine as well as in the proximal femurs suggesting metastatic disease. IMPRESSION: 1. No acute fracture or dislocation. 2. Extensive sclerotic lesions throughout the bony pelvis, lower lumbar spine as well as in the proximal femurs suggesting metastatic disease. Electronically Signed   By: Keane Police D.O.   On: 11/01/2022 13:33    Scheduled Meds:  aspirin EC  81 mg Oral QHS   levothyroxine  50 mcg Oral QAC breakfast   pantoprazole  40 mg Oral Daily   rOPINIRole  1 mg Oral QHS   simvastatin  40 mg Oral QHS    Continuous Infusions:   ceFAZolin (ANCEF) IV     methocarbamol (ROBAXIN) IV       LOS: 1 day   Time spent: 25 minutes  Jason Isidore Marry Guan, MD [facility] Pager [phone]  If 7PM-7AM, please contact night-coverage [email or web address] Password [email or web address] 11/02/2022, 10:49 AM

## 2022-11-02 NOTE — Progress Notes (Signed)
The patient's right lower extremity has been re-examined, and the chart reviewed, and there have been no interval changes.  Patient being treated by anesthesiologist for a. Fib.  The right hip has been marked.    The risks, benefits, and alternatives have been discussed at length, and the patient and his daughter, who I spoke with on the phone last night, are willing to proceed.

## 2022-11-02 NOTE — Plan of Care (Signed)

## 2022-11-02 NOTE — Transfer of Care (Signed)
Immediate Anesthesia Transfer of Care Note  Patient: Jason Murphy  Procedure(s) Performed: INTRAMEDULLARY (IM) NAIL INTERTROCHANTERIC (Right: Hip)  Patient Location: PACU  Anesthesia Type:General  Level of Consciousness: drowsy  Airway & Oxygen Therapy: Patient Spontanous Breathing and Patient connected to face mask oxygen  Post-op Assessment: Report given to RN and Post -op Vital signs reviewed and stable  Post vital signs: Reviewed and stable  Last Vitals:  Vitals Value Taken Time  BP 83/55 11/02/22 1615  Temp 36.2 C 11/02/22 1612  Pulse 66 11/02/22 1617  Resp 11 11/02/22 1617  SpO2 94 % 11/02/22 1617  Vitals shown include unvalidated device data.  Last Pain:  Vitals:   11/02/22 1325  TempSrc: Temporal  PainSc:          Complications: No notable events documented.

## 2022-11-02 NOTE — Anesthesia Procedure Notes (Signed)
Procedure Name: LMA Insertion Date/Time: 11/02/2022 2:44 PM  Performed by: Adalberto Ill, CRNAPre-anesthesia Checklist: Patient identified, Emergency Drugs available, Suction available and Patient being monitored Patient Re-evaluated:Patient Re-evaluated prior to induction Oxygen Delivery Method: Circle System Utilized Preoxygenation: Pre-oxygenation with 100% oxygen Induction Type: IV induction Ventilation: Mask ventilation without difficulty LMA: LMA inserted LMA Size: 4.0 Number of attempts: 1 Airway Equipment and Method: Bite block Placement Confirmation: positive ETCO2 Tube secured with: Tape Dental Injury: Teeth and Oropharynx as per pre-operative assessment

## 2022-11-02 NOTE — Anesthesia Postprocedure Evaluation (Signed)
Anesthesia Post Note  Patient: Jason Murphy  Procedure(s) Performed: INTRAMEDULLARY (IM) NAIL INTERTROCHANTERIC (Right: Hip)  Patient location during evaluation: PACU Anesthesia Type: General Level of consciousness: awake and alert, oriented and patient cooperative Pain management: pain level controlled Vital Signs Assessment: post-procedure vital signs reviewed and stable Respiratory status: spontaneous breathing, nonlabored ventilation and respiratory function stable Cardiovascular status: blood pressure returned to baseline and stable Postop Assessment: adequate PO intake Anesthetic complications: no   No notable events documented.   Last Vitals:  Vitals:   11/02/22 1645 11/02/22 1654  BP: 130/77   Pulse: 86 87  Resp: (!) 26 17  Temp:    SpO2: (!) 86% 93%    Last Pain:  Vitals:   11/02/22 1613  TempSrc:   PainSc: Asleep                 Darrin Nipper

## 2022-11-02 NOTE — Op Note (Signed)
11/02/2022  4:09 PM  PATIENT:  Jason Murphy    PRE-OPERATIVE DIAGNOSIS: Right intertrochanteric hip fracture  POST-OPERATIVE DIAGNOSIS:  Same  PROCEDURE: Intramedullary fixation for right intertrochanteric hip fracture  SURGEON:  Thornton Park, MD  ANESTHESIA:   General  EBL: 50 cc  IMPLANT:  ZIMMER BIOMET AFFIXUS NAIL 11 mm x 100 mm with a 105 mm lag screw and distal interlocking screws 46 mm  and 54 mm in length.  PREOPERATIVE INDICATIONS:  Jason Murphy is a  89 y.o. male who is ambulatory at baseline with a walker with a diagnosis of right trochanteric hip fracture that is post fall.  Denies ambulatory status at baseline patient was recommended for intramedullary fixation of his fracture.  The risks, benefits and alternatives were discussed with the patient and their family.  The risks include but are not limited to infection, bleeding requiring blood transfusion, nerve or blood vessel injury, malunion, nonunion, hardware prominence, hardware failure, leg length discrepancy or change in lower extremity rotation and need for further surgery including hardware removal with conversion to a total hip arthroplasty. Medical risks include but are not limited to DVT and pulmonary embolism, myocardial infarction, stroke, pneumonia, respiratory failure and death. The patient and their family understood these risks and wished to proceed with surgery.  OPERATIVE PROCEDURE:  The patient was brought to the operating room and placed in the supine position on the fracture table. The patient received general anesthesia after an unsuccessful attempt at placement of the spinal.  A closed reduction was performed under C-arm guidance.  The fracture reduction was confirmed on both AP and lateral views. After adequate reduction was achieved, a time out was performed to verify the patient's name, date of birth, medical record number, correct site of surgery correct procedure to be performed. The timeout  was also used to verify the patient received antibiotics and all appropriate instruments, implants and radiographic studies were available in the room. Once all in attendance were in agreement, the case began. The patient was prepped and draped in a sterile fashion.  The patient received preoperative antibiotics with 2 g of Ancef IV.  An incision was made proximal to the greater trochanter in line with the femur. A guidewire was placed over the tip of the greater trochanter and advanced by drill into the proximal femur to the level of the lesser trochanter.  Confirmation of the drill pin position was made on AP and lateral C-arm images.  The threaded guidepin was then overdrilled with the proximal femoral entry reamer.  A ball-tipped guidewire was then advanced down the intramedullary canal, across the fracture, and down the femoral shaft to the knee.  The ball tip guidewire's position was confirmed at both the knee and hip via C-arm imaging. A depth gauge was used to measure the length of the long nail to be used. It was measured to be 400 mm. The actual nail was then inserted into the proximal femur, across the fracture site and down the femoral shaft. Its position was confirmed on AP and lateral C-arm images.  The ball tip guidewire was removed.  Once the nail was completely seated, the drill guide for the lag screw was placed through the guide arm for the Affixus nail. A guidepin was then placed through this drill guide and advanced through the lateral cortex of the femur, across the fracture site and into the femoral head achieving a tip apex distance of less than 25 mm. The length of the drill  pin was measured to be 105 mm, and then the drill for the lag screw was advanced through the lateral cortex, across the fracture site and up into the femoral head to the depth of the lag screw. The lag screw was then advanced by hand into position across the fracture site into the femoral head. Its final position was  confirmed on AP and lateral C-arm images. Compression was applied as traction was carefully released. The set screw in the top of the intramedullary rod was tightened by hand using a screwdriver. It was backed off a quarter turn to allow for compression at the fracture site.  The attention was then turned to placement of the distal interlocking screws. A perfect circle technique was used. Two small stab incisions were made over the distal interlocking screw holes.  A free hand technique was used to drill both distal interlocking screws. The depth of the screw holes was measured with a depth gauge. The 46 mm and D4 mm screws were then advanced into position and tightened by hand. Final C-arm images of the entire intramedullary construct were taken in both the AP and lateral planes.   The wounds were irrigated copiously and closed with 0 Vicryl for closure of the deep fascia and 2-0 Vicryl for subcutaneous closure. The skin was approximated with staples.  Patient was injected with 0.5% Marcaine with epinephrine x 30 cc in the surgical incision sites.  A dry sterile dressing was applied. I was scrubbed and present the entire case and all sharp, sponge and instrument counts were correct at the conclusion of the case. Patient was transferred to a hospital bed and brought to PACU in stable condition.     Timoteo Gaul, MD

## 2022-11-02 NOTE — Anesthesia Preprocedure Evaluation (Addendum)
Anesthesia Evaluation  Patient identified by MRN, date of birth, ID band Patient awake and Patient confused  General Assessment Comment:Alert to place and self. Unable to hold conversation and has confusion on present situation.  Reviewed: Allergy & Precautions, H&P , NPO status , Patient's Chart, lab work & pertinent test results  Airway Mallampati: II  TM Distance: >3 FB Neck ROM: full    Dental  (+) Edentulous Upper, Edentulous Lower   Pulmonary shortness of breath and with exertion, sleep apnea   Supplemental Oxygen  + rhonchi        Cardiovascular hypertension, + CAD  + dysrhythmias (LBBB. h/o pAF)  Rhythm:Irregular Rate:Tachycardia     Neuro/Psych TIA negative psych ROS   GI/Hepatic negative GI ROS, Neg liver ROS,,,  Endo/Other  negative endocrine ROS    Renal/GU      Musculoskeletal  (+) Arthritis ,  Walks with walker   Abdominal  (+) + obese  Peds  Hematology  (+) Blood dyscrasia, anemia   Anesthesia Other Findings Stage IV prostate cancer s/p treatment   Reproductive/Obstetrics negative OB ROS                              Anesthesia Physical Anesthesia Plan  ASA: 3  Anesthesia Plan: Spinal   Post-op Pain Management: Regional block* and Ofirmev IV (intra-op)*   Induction: Intravenous  PONV Risk Score and Plan: Propofol infusion  Airway Management Planned: Natural Airway, Nasal Cannula and LMA  Additional Equipment:   Intra-op Plan:   Post-operative Plan:   Informed Consent: I have reviewed the patients History and Physical, chart, labs and discussed the procedure including the risks, benefits and alternatives for the proposed anesthesia with the patient or authorized representative who has indicated his/her understanding and acceptance.   Patient has DNR.  Continue DNR.   Dental Advisory Given and Consent reviewed with POA  Plan Discussed with: CRNA and  Surgeon  Anesthesia Plan Comments: (Avoid CPR. Armstrong for Airway and cardioversion if needed.)         Anesthesia Quick Evaluation

## 2022-11-02 NOTE — Anesthesia Procedure Notes (Addendum)
Spinal  Patient location during procedure: OR Start time: 11/02/2022 2:30 PM End time: 11/02/2022 2:43 PM Reason for block: surgical anesthesia Staffing Anesthesiologist: Iran Ouch, MD Resident/CRNA: Fredderick Phenix, CRNA Performed by: Iran Ouch, MD Authorized by: Iran Ouch, MD   Preanesthetic Checklist Completed: patient identified, IV checked, site marked, risks and benefits discussed, surgical consent, monitors and equipment checked, pre-op evaluation and timeout performed Spinal Block Patient position: sitting Prep: DuraPrep Patient monitoring: heart rate, cardiac monitor, continuous pulse ox and blood pressure Approach: midline Location: L3-4 (2nd attempt L2-3, 3rd attempt Right paramedia. 4th atempt left paramedian) Injection technique: single-shot Needle Needle type: Sprotte, Whitacre and Pencan  Needle gauge: 24 G Needle length: 9 cm Assessment Events: other event and second provider Additional Notes Unable to access dural space. No paresthesia noted.

## 2022-11-03 ENCOUNTER — Inpatient Hospital Stay: Payer: Medicare HMO

## 2022-11-03 ENCOUNTER — Encounter: Payer: Self-pay | Admitting: Orthopedic Surgery

## 2022-11-03 DIAGNOSIS — S72144A Nondisplaced intertrochanteric fracture of right femur, initial encounter for closed fracture: Secondary | ICD-10-CM | POA: Diagnosis not present

## 2022-11-03 LAB — BLOOD GAS, VENOUS
Acid-base deficit: 4.9 mmol/L — ABNORMAL HIGH (ref 0.0–2.0)
Acid-base deficit: 2.1 mmol/L — ABNORMAL HIGH (ref 0.0–2.0)
Bicarbonate: 18.2 mmol/L — ABNORMAL LOW (ref 20.0–28.0)
Bicarbonate: 19.8 mmol/L — ABNORMAL LOW (ref 20.0–28.0)
O2 Saturation: 67.1 %
O2 Saturation: 56 %
Patient temperature: 37
Patient temperature: 37
pCO2, Ven: 28 mmHg — ABNORMAL LOW (ref 44–60)
pCO2, Ven: 26 mmHg — ABNORMAL LOW (ref 44–60)
pH, Ven: 7.42 (ref 7.25–7.43)
pH, Ven: 7.49 — ABNORMAL HIGH (ref 7.25–7.43)
pO2, Ven: 41 mmHg (ref 32–45)
pO2, Ven: 35 mmHg (ref 32–45)

## 2022-11-03 LAB — MAGNESIUM: Magnesium: 1.9 mg/dL (ref 1.7–2.4)

## 2022-11-03 LAB — CBC
HCT: 36.5 % — ABNORMAL LOW (ref 39.0–52.0)
Hemoglobin: 12 g/dL — ABNORMAL LOW (ref 13.0–17.0)
MCH: 31.1 pg (ref 26.0–34.0)
MCHC: 32.9 g/dL (ref 30.0–36.0)
MCV: 94.6 fL (ref 80.0–100.0)
Platelets: 221 10*3/uL (ref 150–400)
RBC: 3.86 MIL/uL — ABNORMAL LOW (ref 4.22–5.81)
RDW: 13.2 % (ref 11.5–15.5)
WBC: 13.1 10*3/uL — ABNORMAL HIGH (ref 4.0–10.5)
nRBC: 0 % (ref 0.0–0.2)

## 2022-11-03 LAB — COMPREHENSIVE METABOLIC PANEL
ALT: 16 U/L (ref 0–44)
AST: 52 U/L — ABNORMAL HIGH (ref 15–41)
Albumin: 3.6 g/dL (ref 3.5–5.0)
Alkaline Phosphatase: 73 U/L (ref 38–126)
Anion gap: 16 — ABNORMAL HIGH (ref 5–15)
BUN: 31 mg/dL — ABNORMAL HIGH (ref 8–23)
CO2: 18 mmol/L — ABNORMAL LOW (ref 22–32)
Calcium: 8.6 mg/dL — ABNORMAL LOW (ref 8.9–10.3)
Chloride: 103 mmol/L (ref 98–111)
Creatinine, Ser: 1.17 mg/dL (ref 0.61–1.24)
GFR, Estimated: 56 mL/min — ABNORMAL LOW (ref >60)
Glucose, Bld: 164 mg/dL — ABNORMAL HIGH (ref 70–99)
Potassium: 3.8 mmol/L (ref 3.5–5.1)
Sodium: 137 mmol/L (ref 135–145)
Total Bilirubin: 1.3 mg/dL — ABNORMAL HIGH (ref 0.3–1.2)
Total Protein: 7 g/dL (ref 6.5–8.1)

## 2022-11-03 LAB — VITAMIN B12: Vitamin B-12: 393 pg/mL (ref 180–914)

## 2022-11-03 LAB — HEMOGLOBIN A1C
Hgb A1c MFr Bld: 5.7 % — ABNORMAL HIGH (ref 4.8–5.6)
Mean Plasma Glucose: 116.89 mg/dL

## 2022-11-03 LAB — GLUCOSE, CAPILLARY
Glucose-Capillary: 179 mg/dL — ABNORMAL HIGH (ref 70–99)
Glucose-Capillary: 164 mg/dL — ABNORMAL HIGH (ref 70–99)
Glucose-Capillary: 144 mg/dL — ABNORMAL HIGH (ref 70–99)

## 2022-11-03 LAB — PHOSPHORUS: Phosphorus: 2.6 mg/dL (ref 2.5–4.6)

## 2022-11-03 LAB — TSH: TSH: 3.346 u[IU]/mL (ref 0.350–4.500)

## 2022-11-03 MED ORDER — INSULIN ASPART 100 UNIT/ML IJ SOLN
0.0000 [IU] | Freq: Three times a day (TID) | INTRAMUSCULAR | Status: DC
  Administered 2022-11-03: 2 [IU] via SUBCUTANEOUS
  Administered 2022-11-03 – 2022-11-10 (×6): 1 [IU] via SUBCUTANEOUS
  Filled 2022-11-03 (×7): qty 1

## 2022-11-03 MED ORDER — PIPERACILLIN-TAZOBACTAM 3.375 G IVPB
3.3750 g | Freq: Three times a day (TID) | INTRAVENOUS | Status: AC
  Administered 2022-11-03 – 2022-11-10 (×21): 3.375 g via INTRAVENOUS
  Filled 2022-11-03 (×22): qty 50

## 2022-11-03 MED ORDER — STERILE WATER FOR INJECTION IV SOLN
INTRAVENOUS | Status: DC
  Filled 2022-11-03 (×3): qty 1000

## 2022-11-03 NOTE — Progress Notes (Signed)
NIV therapy contraindicated while patient is wearing mits on both hands, per hospital restraint policy.

## 2022-11-03 NOTE — Evaluation (Signed)
Physical Therapy Evaluation Patient Details Name: Jason Murphy MRN: [id number] DOB: 07/16/1920 Today's Date: 11/03/2022  History of Present Illness  89 y/o male presented to ED on 11/01/22 following a fall where he sustained a R intertrochanteric fx. S/p R femur IM nail on 1/17. PMH: advanced metastatic prostate cancer, CAD, HTN, OSA  Clinical Impression  Patient admitted with the above. Patient seen in conjunction with OT to maximize activity tolerance and to address cognition. Oriented to self during session. Patient presents with weakness, impaired balance, decreased activity tolerance, and impaired cognition. Required modA+2 for bed mobility and demonstrates poor sitting balance with posterior lean. Following 1 step commands throughout session. Once back in bed, patient becoming increasingly agitated and combative with donning mittens, NT present to assist. Patient will benefit from skilled PT services during acute stay to address listed deficits. Recommend SNF at discharge to maximize functional mobility and safety.        Recommendations for follow up therapy are one component of a multi-disciplinary discharge planning process, led by the attending physician.  Recommendations may be updated based on patient status, additional functional criteria and insurance authorization.  Follow Up Recommendations Skilled nursing-short term rehab (<3 hours/day) Can patient physically be transported by private vehicle: No    Assistance Recommended at Discharge Frequent or constant Supervision/Assistance  Patient can return home with the following  Two people to help with walking and/or transfers;Two people to help with bathing/dressing/bathroom;Assistance with cooking/housework;Direct supervision/assist for medications management;Assistance with feeding;Direct supervision/assist for financial management;Assist for transportation;Help with stairs or ramp for entrance    Equipment Recommendations Hospital  bed;Wheelchair cushion (measurements PT);Wheelchair (measurements PT);Rolling Tavita Eastham (2 wheels);BSC/3in1  Recommendations for Other Services       Functional Status Assessment Patient has had a recent decline in their functional status and demonstrates the ability to make significant improvements in function in a reasonable and predictable amount of time.     Precautions / Restrictions Precautions Precautions: Fall Restrictions Weight Bearing Restrictions: Yes RLE Weight Bearing: Weight bearing as tolerated      Mobility  Bed Mobility Overal bed mobility: Needs Assistance Bed Mobility: Supine to Sit, Sit to Supine     Supine to sit: Mod assist, +2 for physical assistance Sit to supine: Mod assist, +2 for physical assistance        Transfers Overall transfer level: Needs assistance Equipment used: Rolling Kaaren Nass (2 wheels)               General transfer comment: attempted to stand, limited by cognition, does not clear rear with MAX A x2    Ambulation/Gait                  Stairs            Wheelchair Mobility    Modified Rankin (Stroke Patients Only)       Balance Overall balance assessment: Needs assistance Sitting-balance support: Feet supported, No upper extremity supported Sitting balance-Leahy Scale: Poor   Postural control: Posterior lean                                   Pertinent Vitals/Pain Pain Assessment Pain Assessment: Faces Faces Pain Scale: Hurts little more Pain Location: R hip Pain Descriptors / Indicators: Aching, Discomfort Pain Intervention(s): Limited activity within patient's tolerance, Repositioned    Home Living Family/patient expects to be discharged to:: Private residence  Additional Comments: pt unable to state    Prior Function Prior Level of Function : Needs assist             Mobility Comments: ambulatory with RW per chart       Hand Dominance    Dominant Hand: Right    Extremity/Trunk Assessment   Upper Extremity Assessment Upper Extremity Assessment: Defer to OT evaluation    Lower Extremity Assessment Lower Extremity Assessment: Generalized weakness    Cervical / Trunk Assessment Cervical / Trunk Assessment: Kyphotic  Communication   Communication: HOH  Cognition Arousal/Alertness: Awake/alert Behavior During Therapy: WFL for tasks assessed/performed, Agitated Overall Cognitive Status: No family/caregiver present to determine baseline cognitive functioning                                 General Comments: follows one step commands inconsistently, perseverative on holding items, becomes agitated and combative with attempts to feed pt, remove items, or don mitts        General Comments      Exercises     Assessment/Plan    PT Assessment Patient needs continued PT services  PT Problem List Decreased range of motion;Decreased strength;Decreased activity tolerance;Decreased balance;Decreased mobility;Decreased cognition;Decreased knowledge of use of DME;Decreased safety awareness;Decreased knowledge of precautions       PT Treatment Interventions DME instruction;Gait training;Functional mobility training;Therapeutic activities;Therapeutic exercise;Balance training;Patient/family education    PT Goals (Current goals can be found in the Care Plan section)  Acute Rehab PT Goals Patient Stated Goal: did not state PT Goal Formulation: Patient unable to participate in goal setting Time For Goal Achievement: 11/17/22 Potential to Achieve Goals: Fair    Frequency 7X/week (may drop to 3x/week if cognition doesn't improve)     Co-evaluation PT/OT/SLP Co-Evaluation/Treatment: Yes Reason for Co-Treatment: For patient/therapist safety;To address functional/ADL transfers;Necessary to address cognition/behavior during functional activity PT goals addressed during session: Mobility/safety with mobility OT  goals addressed during session: ADL's and self-care       AM-PAC PT "6 Clicks" Mobility  Outcome Measure Help needed turning from your back to your side while in a flat bed without using bedrails?: Total Help needed moving from lying on your back to sitting on the side of a flat bed without using bedrails?: Total Help needed moving to and from a bed to a chair (including a wheelchair)?: Total Help needed standing up from a chair using your arms (e.g., wheelchair or bedside chair)?: Total Help needed to walk in hospital room?: Total Help needed climbing 3-5 steps with a railing? : Total 6 Click Score: 6    End of Session   Activity Tolerance: Treatment limited secondary to agitation Patient left: in bed;with call bell/phone within reach;with bed alarm set;with restraints reapplied Nurse Communication: Mobility status PT Visit Diagnosis: Unsteadiness on feet (R26.81);Difficulty in walking, not elsewhere classified (R26.2);Other abnormalities of gait and mobility (R26.89);Muscle weakness (generalized) (M62.81);History of falling (Z91.81)    Time: 0093-8182 PT Time Calculation (min) (ACUTE ONLY): 33 min   Charges:   PT Evaluation $PT Eval Moderate Complexity: 1 Mod          Yoni Lobos A. Gilford Rile PT, DPT [facility]   Sheridan Gettel A Kensie Susman 11/03/2022, 12:49 PM

## 2022-11-03 NOTE — Progress Notes (Signed)
Attempted IV placement. Pt confused and uncooperative. When stuck patient grabbed this RN's hand and IV needle and refused further attempts at IV placement or assessment for new site. Floor RN aware.

## 2022-11-03 NOTE — Progress Notes (Signed)
Subjective:  POD #1 s/p intramedullary fixation for right intertrochanteric hip fracture with femoral metastasis.  Patient is comfortable but confused.  Patient with agitation overnight.  He denies any significant right hip pain.  Objective:   VITALS:   Vitals:   11/02/22 1730 11/02/22 1824 11/02/22 2200 11/03/22 0846  BP: (!) 143/73 121/74 (!) 138/93 (!) 141/67  Pulse: 79 82 (!) 106 85  Resp: '18 14  16  '$ Temp:   97.8 F (36.6 C) 98.3 F (36.8 C)  TempSrc:   Oral   SpO2: 93% 94% 97% (!) 89%  Weight:      Height:        PHYSICAL EXAM: Right lower extremity: Neurovascular intact Sensation intact distally Intact pulses distally Dorsiflexion/Plantar flexion intact Incision: dressing C/D/I No cellulitis present Compartment soft  LABS  Results for orders placed or performed during the hospital encounter of 11/01/22 (from the past 24 hour(s))  CBC     Status: Abnormal   Collection Time: 11/03/22  3:59 AM  Result Value Ref Range   WBC 13.1 (H) 4.0 - 10.5 K/uL   RBC 3.86 (L) 4.22 - 5.81 MIL/uL   Hemoglobin 12.0 (L) 13.0 - 17.0 g/dL   HCT 36.5 (L) 39.0 - 52.0 %   MCV 94.6 80.0 - 100.0 fL   MCH 31.1 26.0 - 34.0 pg   MCHC 32.9 30.0 - 36.0 g/dL   RDW 13.2 11.5 - 15.5 %   Platelets 221 150 - 400 K/uL   nRBC 0.0 0.0 - 0.2 %  Comprehensive metabolic panel     Status: Abnormal   Collection Time: 11/03/22  3:59 AM  Result Value Ref Range   Sodium 137 135 - 145 mmol/L   Potassium 3.8 3.5 - 5.1 mmol/L   Chloride 103 98 - 111 mmol/L   CO2 18 (L) 22 - 32 mmol/L   Glucose, Bld 164 (H) 70 - 99 mg/dL   BUN 31 (H) 8 - 23 mg/dL   Creatinine, Ser 1.17 0.61 - 1.24 mg/dL   Calcium 8.6 (L) 8.9 - 10.3 mg/dL   Total Protein 7.0 6.5 - 8.1 g/dL   Albumin 3.6 3.5 - 5.0 g/dL   AST 52 (H) 15 - 41 U/L   ALT 16 0 - 44 U/L   Alkaline Phosphatase 73 38 - 126 U/L   Total Bilirubin 1.3 (H) 0.3 - 1.2 mg/dL   GFR, Estimated 56 (L) >60 mL/min   Anion gap 16 (H) 5 - 15  Magnesium     Status: None    Collection Time: 11/03/22  3:59 AM  Result Value Ref Range   Magnesium 1.9 1.7 - 2.4 mg/dL  Phosphorus     Status: None   Collection Time: 11/03/22  3:59 AM  Result Value Ref Range   Phosphorus 2.6 2.5 - 4.6 mg/dL  Vitamin B12     Status: None   Collection Time: 11/03/22  3:59 AM  Result Value Ref Range   Vitamin B-12 393 180 - 914 pg/mL  TSH     Status: None   Collection Time: 11/03/22  3:59 AM  Result Value Ref Range   TSH 3.346 0.350 - 4.500 uIU/mL  Hemoglobin A1c     Status: Abnormal   Collection Time: 11/03/22  3:59 AM  Result Value Ref Range   Hgb A1c MFr Bld 5.7 (H) 4.8 - 5.6 %   Mean Plasma Glucose 116.89 mg/dL  Blood gas, venous     Status: Abnormal   Collection Time:  11/03/22  4:07 AM  Result Value Ref Range   pH, Ven 7.42 7.25 - 7.43   pCO2, Ven 28 (L) 44 - 60 mmHg   pO2, Ven 41 32 - 45 mmHg   Bicarbonate 18.2 (L) 20.0 - 28.0 mmol/L   Acid-base deficit 4.9 (H) 0.0 - 2.0 mmol/L   O2 Saturation 67.1 %   Patient temperature 37.0    Collection site VEIN   Glucose, capillary     Status: Abnormal   Collection Time: 11/03/22  5:15 AM  Result Value Ref Range   Glucose-Capillary 179 (H) 70 - 99 mg/dL    DG Chest 1 View  Result Date: 11/03/2022 CLINICAL DATA:  Hypoxia. EXAM: CHEST  1 VIEW COMPARISON:  Chest radiograph 11/01/2022 1314 hours. FINDINGS: 1010 hours. Right-greater-than-left patchy airspace opacities with associated bronchial wall thickening, may be of infectious/inflammatory etiology or represent mild pulmonary edema. Normal heart size. Stable mediastinal contours with atherosclerotic calcifications and tortuosity of the aorta. The left costophrenic sulcus is incompletely imaged. No right pleural effusion. No pneumothorax. IMPRESSION: 1. New right greater than left patchy airspace opacities with associated bronchial wall thickening may be of infectious or inflammatory etiology or represent mild pulmonary edema. 2.  Aortic Atherosclerosis (ICD10-I70.0).  Electronically Signed   By: Emmit Alexanders M.D.   On: 11/03/2022 10:44   CT HEAD WO CONTRAST (5MM)  Result Date: 11/03/2022 CLINICAL DATA:  Mental status change, unknown cause EXAM: CT HEAD WITHOUT CONTRAST TECHNIQUE: Contiguous axial images were obtained from the base of the skull through the vertex without intravenous contrast. RADIATION DOSE REDUCTION: This exam was performed according to the departmental dose-optimization program which includes automated exposure control, adjustment of the mA and/or kV according to patient size and/or use of iterative reconstruction technique. COMPARISON:  04/13/2022 FINDINGS: Brain: There is atrophy and chronic small vessel disease changes. No acute intracranial abnormality. Specifically, no hemorrhage, hydrocephalus, mass lesion, acute infarction, or significant intracranial injury. Old left basal ganglia lacunar infarcts. Vascular: No hyperdense vessel or unexpected calcification. Skull: No acute calvarial abnormality. Sinuses/Orbits: No acute findings Other: None IMPRESSION: Old left basal ganglia lacunar infarcts. Atrophy, chronic microvascular disease. No acute intracranial abnormality. Electronically Signed   By: Rolm Baptise M.D.   On: 11/03/2022 02:00   DG FEMUR, MIN 2 VIEWS RIGHT  Result Date: 11/02/2022 CLINICAL DATA:  Postop right hip fracture EXAM: RIGHT FEMUR 2 VIEWS COMPARISON:  Radiographs dated November 01, 2022 FINDINGS: Status post ORIF with intramedullary nail placement for proximal right femoral fracture subcutaneous emphysema and surgical screws as expected. Prominent vascular calcifications. IMPRESSION: Status post ORIF with intramedullary nail placement for proximal right femoral fracture with postsurgical changes as expected. Electronically Signed   By: Keane Police D.O.   On: 11/02/2022 17:01   DG FEMUR, MIN 2 VIEWS RIGHT  Result Date: 11/02/2022 CLINICAL DATA:  Status post right femoral fixation EXAM: RIGHT FEMUR 2 VIEWS COMPARISON:  None  Available. FINDINGS: Six fluoroscopic images obtained during right femoral fixation. 1 minute 40 seconds fluoro time utilized. Radiation dose 17.77 mGy Kerma. Please see performing physicians operative report for full details IMPRESSION: Fluoroscopic images were obtained for intraoperative guidance of right femoral fixation. Electronically Signed   By: Darrin Nipper M.D.   On: 11/02/2022 16:51   DG C-Arm 1-60 Min-No Report  Result Date: 11/02/2022 Fluoroscopy was utilized by the requesting physician.  No radiographic interpretation.   DG C-Arm 1-60 Min-No Report  Result Date: 11/02/2022 Fluoroscopy was utilized by the requesting physician.  No  radiographic interpretation.   CT L-SPINE NO CHARGE  Result Date: 11/01/2022 CLINICAL DATA:  Right hip pain after fall. History of prostate cancer. EXAM: CT LUMBAR SPINE WITHOUT CONTRAST TECHNIQUE: Multidetector CT imaging of the lumbar spine was performed without intravenous contrast administration. Multiplanar CT image reconstructions were also generated. RADIATION DOSE REDUCTION: This exam was performed according to the departmental dose-optimization program which includes automated exposure control, adjustment of the mA and/or kV according to patient size and/or use of iterative reconstruction technique. COMPARISON:  CT abdomen pelvis dated June 03, 2020. FINDINGS: Segmentation: 5 lumbar type vertebrae. Alignment: Unchanged 8 mm anterolisthesis at L5-S1 due to chronic bilateral L5 pars defects. Vertebrae: No acute fracture. Diffuse sclerotic lesions throughout the visualized thoracolumbar spine, ribs, and bony pelvis, progressed since 2021. Paraspinal and other soft tissues: Please see separate CT abdomen and pelvis report from same day. Incompletely visualized infrarenal abdominal aortic aneurysm. 6 mm calculus within a collapsed left renal pelvis. Disc levels: Unchanged diffuse moderate to severe degenerative disc disease mild-to-moderate facet arthropathy.  IMPRESSION: 1. No acute fracture or traumatic malalignment of the lumbar spine. 2. Progressive diffuse sclerotic osseous metastatic disease. 3. Unchanged chronic bilateral L5 pars defects with 8 mm anterolisthesis at L5-S1. 4. Unchanged diffuse moderate to severe lumbar spondylosis. 5. Incompletely visualized abdominal aortic aneurysm. Please see separate CT abdomen pelvis report from same day for accurate measurements and follow-up recommendations. Electronically Signed   By: Titus Dubin M.D.   On: 11/01/2022 15:27   CT ABDOMEN PELVIS WO CONTRAST  Result Date: 11/01/2022 CLINICAL DATA:  Abdominal trauma. Blunt fall with pelvic and right leg pain. Patient fell landing on the right side. EXAM: CT ABDOMEN AND PELVIS WITHOUT CONTRAST TECHNIQUE: Multidetector CT imaging of the abdomen and pelvis was performed following the standard protocol without IV contrast. RADIATION DOSE REDUCTION: This exam was performed according to the departmental dose-optimization program which includes automated exposure control, adjustment of the mA and/or kV according to patient size and/or use of iterative reconstruction technique. COMPARISON:  06/03/2020 FINDINGS: Lower chest: Atelectasis or scarring in the lung bases. Mild basilar bronchiectasis, likely traction. Hepatobiliary: Multiple low-attenuation lesions demonstrated throughout the liver, most subcentimeter in size. Largest is 3.2 cm in diameter. Appearances are similar to previous study, likely representing multiple cysts. Gallbladder is surgically absent. Pancreas: Unremarkable. No pancreatic ductal dilatation or surrounding inflammatory changes. Spleen: Calcified granulomas. Adrenals/Urinary Tract: No adrenal gland nodules. Stone in the left renal pelvis measuring 8 mm maximal diameter. This is unchanged in position since prior study. No hydronephrosis or hydroureter. Bladder wall is thickened with multiple trabeculations likely representing chronic bladder outlet  obstruction. Similar appearance to prior study. Stomach/Bowel: Stomach, small bowel, and colon are not abnormally distended. No wall thickening or inflammatory changes are appreciated. Colonic diverticulosis without evidence of acute diverticulitis. Vascular/Lymphatic: Infrarenal abdominal aortic aneurysm measuring 5.3 cm AP dimension and 5.6 cm transverse dimension. The aneurysm is enlarged since prior study, with previous AP diameter measured at 5 cm. No periaortic stranding. Ectatic and calcified iliac arteries. No significant lymphadenopathy. Reproductive: Prostate gland is decreased in size since prior study. Possibly interval radiation or medical therapy? Other: No free air or free fluid in the abdomen. Minimal periumbilical hernia containing fat. Musculoskeletal: Acute appearing fracture of the right femoral neck extending into the inter trochanteric region with mild impaction of the fracture fragments. Diffuse sclerotic metastases demonstrated throughout the visualized skeleton, increasing since the previous study. Spondylolysis with mild spondylolisthesis at L5-S1. IMPRESSION: 1. Acute inter trochanteric fracture of  the right proximal femur. 2. Diffuse sclerotic bone metastases, increasing since prior study. 3. 5.3 by 5.6 cm infrarenal abdominal aortic aneurysm, increasing since prior study. Recommend referral to a vascular specialist. Reference: J Am Coll Radiol 6578;46:962-952. 4. No evidence of solid organ injury or bowel perforation. Electronically Signed   By: Lucienne Capers M.D.   On: 11/01/2022 15:24   DG Tibia/Fibula Right  Result Date: 11/01/2022 CLINICAL DATA:  Right leg pain after a fall. EXAM: RIGHT TIBIA AND FIBULA - 2 VIEW COMPARISON:  None Available. FINDINGS: There is no evidence of fracture or other focal bone lesions. Soft tissues are unremarkable. IMPRESSION: Negative. Electronically Signed   By: Misty Stanley M.D.   On: 11/01/2022 14:49   DG Femur Min 2 Views Right  Result  Date: 11/01/2022 CLINICAL DATA:  Pain after a fall. EXAM: RIGHT FEMUR 2 VIEWS COMPARISON:  Hip films from earlier the same day. FINDINGS: Frog-leg lateral view of the femur shows apparent nondisplaced basicervical femoral neck fracture with angulation. This is not evident on the AP film and more evident than on the dedicated right hip study performed earlier today raising the question of some interval displacement. Heterogeneous mineralization but noted in the proximal femur in right pelvis with sclerotic lesions raising concern for metastatic involvement. IMPRESSION: 1. Imaging features highly suspicious for basicervical right femoral neck fracture, occult on AP imaging and seen only on the lateral image. Correlate clinically and CT or MR imaging could be used to be more definitive as clinically warranted. 2. Scattered sclerotic lesions in the right pelvis and proximal right femur concerning for metastatic disease. Electronically Signed   By: Misty Stanley M.D.   On: 11/01/2022 14:48   DG Chest Port 1 View  Result Date: 11/01/2022 CLINICAL DATA:  Hip fracture EXAM: PORTABLE CHEST 1 VIEW COMPARISON:  None Available. FINDINGS: The heart size and mediastinal contours are within normal limits. Atherosclerotic calcification of the aortic arch. Both lungs are clear. Diffuse sclerotic metastatic disease of the spine. IMPRESSION: 1. No active disease. 2. Diffuse sclerotic metastatic disease of the spine. Electronically Signed   By: Keane Police D.O.   On: 11/01/2022 13:34   DG Hip Unilat W or Wo Pelvis 2-3 Views Right  Result Date: 11/01/2022 CLINICAL DATA:  Fall, hip pain EXAM: DG HIP (WITH OR WITHOUT PELVIS) 2-3V RIGHT COMPARISON:  None Available. FINDINGS: There is no evidence of hip fracture or dislocation. There are extensive sclerotic lesions throughout the bony pelvis, lower lumbar spine as well as in the proximal femurs suggesting metastatic disease. IMPRESSION: 1. No acute fracture or dislocation. 2.  Extensive sclerotic lesions throughout the bony pelvis, lower lumbar spine as well as in the proximal femurs suggesting metastatic disease. Electronically Signed   By: Keane Police D.O.   On: 11/01/2022 13:33    Assessment/Plan: 1 Day Post-Op   Principal Problem:   Femur fracture ([place]) Active Problems:   PROSTATE CANCER   OBSTRUCTIVE SLEEP APNEA   Essential hypertension   Paroxysmal A-fib ([facility])   Infrarenal abdominal aortic aneurysm (AAA) without rupture ([facility])  CT scan shows no acute intracranial pathology.  Chest x-ray was new right greater than left patchy airspace opacities which may be infectious or inflammatory etiology or represent mild pulmonary edema.  Will discuss antibiotics with hospitalist.  Patient will begin Lovenox today for DVT prophylaxis.  Begin physical therapy when appropriate.  Patient is weightbearing as tolerated right lower extremity without restrictions.  Hemoglobin is 12.0 today.  Vitals are stable.  Thornton Park , MD 11/03/2022, 11:57 AM

## 2022-11-03 NOTE — Evaluation (Signed)
Occupational Therapy Evaluation Patient Details Name: Jason Murphy MRN: [id number] DOB: 23-Oct-1919 Today's Date: 11/03/2022   History of Present Illness 89 y/o male presented to ED on 11/01/22 following a fall where he sustained a R intertrochanteric fx. S/p R femur IM nail on 1/17. PMH: advanced metastatic prostate cancer, CAD, HTN, OSA   Clinical Impression   Jason Murphy was seen for OT/PT co-evaluation this date. Prior to hospital admission, pt was using RW for mobility per chart. Pt presents to acute OT demonstrating impaired ADL performance and functional mobility 2/2 decreased activity tolerance and functional strength/ROM/balance deficits. Pt currently requires MOD A x2 sup<>sit, poor sitting balance with posterior lean. Pt tolerates ~15 min sitting, attempted to engage pt in eating breakfast however refuses. MIN A to place dentures in sitting. Perseverative on holding items, pt follows 1 step commands however upon return to bed pt becomes agitated with donning meds - NT in to assist. Pt would benefit from skilled OT to address noted impairments and functional limitations (see below for any additional details). Upon hospital discharge, recommend STR to maximize pt safety and return to PLOF.    Recommendations for follow up therapy are one component of a multi-disciplinary discharge planning process, led by the attending physician.  Recommendations may be updated based on patient status, additional functional criteria and insurance authorization.   Follow Up Recommendations  Skilled nursing-short term rehab (<3 hours/day)     Assistance Recommended at Discharge Frequent or constant Supervision/Assistance  Patient can return home with the following Two people to help with walking and/or transfers;Two people to help with bathing/dressing/bathroom;Help with stairs or ramp for entrance    Functional Status Assessment  Patient has had a recent decline in their functional status and demonstrates  the ability to make significant improvements in function in a reasonable and predictable amount of time.  Equipment Recommendations  Other (comment) (defer)    Recommendations for Other Services       Precautions / Restrictions Precautions Precautions: Fall Restrictions Weight Bearing Restrictions: Yes RLE Weight Bearing: Weight bearing as tolerated      Mobility Bed Mobility Overal bed mobility: Needs Assistance Bed Mobility: Supine to Sit, Sit to Supine     Supine to sit: Mod assist, +2 for physical assistance Sit to supine: Mod assist, +2 for physical assistance        Transfers Overall transfer level: Needs assistance Equipment used: Rolling walker (2 wheels)               General transfer comment: attempted to stand, limited by cognition, does not clear rear with MAX A x2      Balance Overall balance assessment: Needs assistance Sitting-balance support: Feet supported, No upper extremity supported Sitting balance-Leahy Scale: Poor   Postural control: Posterior lean                                 ADL either performed or assessed with clinical judgement   ADL Overall ADL's : Needs assistance/impaired                                       General ADL Comments: SUP doff L sock bed level, MOD A to don. Anticipate MAX A x2 for toilet t/f      Pertinent Vitals/Pain Pain Assessment Pain Assessment: Faces Faces Pain Scale: Hurts little  more Pain Location: R hip Pain Descriptors / Indicators: Aching, Discomfort Pain Intervention(s): Limited activity within patient's tolerance, Repositioned     Hand Dominance Right   Extremity/Trunk Assessment Upper Extremity Assessment Upper Extremity Assessment: Overall WFL for tasks assessed   Lower Extremity Assessment Lower Extremity Assessment: Generalized weakness       Communication Communication Communication: HOH   Cognition Arousal/Alertness: Awake/alert Behavior  During Therapy: WFL for tasks assessed/performed, Agitated Overall Cognitive Status: No family/caregiver present to determine baseline cognitive functioning                                 General Comments: follows one step commands inconsistently, perseverative on holding items, becomes agitated with attempts to feed pt, remove items, or don mitts      Home Living Family/patient expects to be discharged to:: Private residence                                 Additional Comments: pt unable to state      Prior Functioning/Environment Prior Level of Function : Needs assist             Mobility Comments: ambulatory with RW per chart          OT Problem List: Decreased strength;Decreased range of motion;Decreased activity tolerance;Impaired balance (sitting and/or standing);Decreased safety awareness      OT Treatment/Interventions: Self-care/ADL training;Therapeutic exercise;Energy conservation;DME and/or AE instruction;Therapeutic activities;Patient/family education;Balance training    OT Goals(Current goals can be found in the care plan section) Acute Rehab OT Goals Patient Stated Goal: to be left alone OT Goal Formulation: With patient Time For Goal Achievement: 11/17/22 Potential to Achieve Goals: Fair ADL Goals Pt Will Perform Grooming: sitting;with set-up;with supervision Pt Will Perform Lower Body Dressing: with supervision;bed level Pt Will Transfer to Toilet: with mod assist;stand pivot transfer;bedside commode  OT Frequency: Min 2X/week    Co-evaluation PT/OT/SLP Co-Evaluation/Treatment: Yes Reason for Co-Treatment: Necessary to address cognition/behavior during functional activity;For patient/therapist safety;To address functional/ADL transfers PT goals addressed during session: Mobility/safety with mobility OT goals addressed during session: ADL's and self-care      AM-PAC OT "6 Clicks" Daily Activity     Outcome Measure Help  from another person eating meals?: A Little Help from another person taking care of personal grooming?: A Little Help from another person toileting, which includes using toliet, bedpan, or urinal?: A Lot Help from another person bathing (including washing, rinsing, drying)?: A Lot Help from another person to put on and taking off regular upper body clothing?: A Little Help from another person to put on and taking off regular lower body clothing?: A Lot 6 Click Score: 15   End of Session Nurse Communication: Mobility status  Activity Tolerance: Patient tolerated treatment well Patient left: in bed;with call bell/phone within reach;with bed alarm set;with nursing/sitter in room;with restraints reapplied (B mitts donned)  OT Visit Diagnosis: Other abnormalities of gait and mobility (R26.89);Muscle weakness (generalized) (M62.81)                Time: 2706-2376 OT Time Calculation (min): 33 min Charges:  OT General Charges $OT Visit: 1 Visit OT Evaluation $OT Eval Moderate Complexity: 1 Mod OT Treatments $Self Care/Home Management : 8-22 mins  Dessie Coma, M.S. OTR/L  11/03/22, 9:48 AM  ascom [phone]

## 2022-11-03 NOTE — Progress Notes (Addendum)
PROGRESS NOTE  Jason Murphy Jason Murphy DOB: 03-24-1920 DOA: 11/01/2022 PCP: Pcp, No  Hospital Course/Subjective: Jason Murphy is a 89 y.o. male with medical history significant of advanced metastatic prostate cancer, CAD, hypertension, hyperlipidemia, OSA, RLS, who presents to the ED due to right hip pain. He apparently tripped on something in the hall of his ALF while ambulating with his walker and unfortunately suffered right femoral neck fracture. He was seen by orthopedic surgery and they performed Intramedullary fixation for right intertrochanteric hip fracture without complication on 2/77. He had some garbled speech and agitation overnight and so CT scan of the head was obtained.  This morning he is resting comfortably, moving all extremities, following commands and speaking, however he is a little bit disoriented, and agitated.  His speech remains a little bit garbled.  He has mitts on, and has refused all oral medications.  Discussed with his nurse this morning at the bedside, also spoke on the phone with his daughter Jason Murphy.  Assessment/Plan:  Principal Problem:   Femur fracture (HCC) Active Problems:   Essential hypertension   Paroxysmal A-fib (HCC)   PROSTATE CANCER   OBSTRUCTIVE SLEEP APNEA   Infrarenal abdominal aortic aneurysm (AAA) without rupture (HCC)   Assessment and Plan: * Femur fracture (HCC) Patient is presenting after a ground-level fall that was mechanical in nature with CT imaging demonstrating an intertrochanteric fracture.  Patient has evidence of diffuse sclerotic lesions consistent with metastatic prostate cancer.  No contraindication to move forward with femur repair. - Orthopedic surgery following, appreciate their management and recommendations - Pain control with Norco, Dilaudid and Robaxin - Postoperative PT/OT, they recommend subacute nursing facility - [facility] consultation for placement assistance  Hospital delirium Likely exacerbated by recent  surgery, postop pain, etc. Patient is moving all extremities, speaking though slightly garbled. Continue supportive care, discussed with daughter Jason Murphy and she requests no further workup.  She made the comment that "the goal is to keep him comfortable" CT head from 1/17 reviewed, no acute process; cancel MRI brain. UA was ordered overnight not collected  yet, I will also check CXR to rule out infection. -- Update: patchy opacities on Chest Xray, will treat empirically with Zosyn  HCAP Patient is afebrile but marginally hypoxic (89% on room air) and has white count with CXR opacities. Will treat empirically with Zosyn to cover possible HCAP.   Essential hypertension Patient has a history of hypertension but is not currently on any medication for this.  Blood pressure initially elevated due to pain but is now back to normal range. - No indication for antihypertensives at this time. - Pain control as noted above  Paroxysmal A-fib [facility]) Patient has a history of paroxysmal atrial fibrillation, currently in sinus rhythm.  He is not on any anticoagulation or rate controlling agents.  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  He is currently on Eligard with last dose in May 2023.  Evidence of progressive sclerotic lesions on imaging today. - Continue outpatient follow-up with urology  OBSTRUCTIVE SLEEP APNEA - Continue home CPAP  Infrarenal abdominal aortic aneurysm (AAA) without rupture (HCC) On CT imaging 1/16, there was demonstration of a 5.3 x 5.6 cm infrarenal AAA with no evidence of rupture.  Previous imaging was reviewed, with CT in 2021 that demonstrated a 5 cm AAA.  Admitting MD discussed with vascular surgery, no need for any further evaluation or intervention prior to femur repair tomorrow morning. - Recommend outpatient  follow-up with vascular surgery for further monitoring  DVT Prophylaxis: Lovenox per orthopedic  surgery  Code Status: DNR   Family Communication: Discussed with daughter Jason Murphy over the phone this morning.  Disposition Plan: Anticipate rehab placement  Consultants: Orthopedic Surgery   Procedures: Plan ORIF 1/17   Antimicrobials: Anti-infectives (From admission, onward)    Start     Dose/Rate Route Frequency Ordered Stop   11/02/22 2100  ceFAZolin (ANCEF) IVPB 2g/100 mL premix        2 g 200 mL/hr over 30 Minutes Intravenous Every 6 hours 11/02/22 1732 11/03/22 0859   11/02/22 0000  ceFAZolin (ANCEF) IVPB 2g/100 mL premix        2 g 200 mL/hr over 30 Minutes Intravenous 30 min pre-op 11/01/22 1802 11/02/22 1518       Objective: Vitals:   11/02/22 1730 11/02/22 1824 11/02/22 2200 11/03/22 0846  BP: (!) 143/73 121/74 (!) 138/93 (!) 141/67  Pulse: 79 82 (!) 106 85  Resp: '18 14  16  '$ Temp:   97.8 F (36.6 C) 98.3 F (36.8 C)  TempSrc:   Oral   SpO2: 93% 94% 97% (!) 89%  Weight:      Height:        Intake/Output Summary (Last 24 hours) at 11/03/2022 1042 Last data filed at 11/02/2022 2134 Gross per 24 hour  Intake 100 ml  Output 550 ml  Net -450 ml    Filed Weights   11/01/22 1251  Weight: 74.7 kg   Exam: General:  Alert, oriented, somewhat agitated wearing mitts, in no acute distress  Eyes: EOMI, clear conjuctivae, white sclerea Neck: supple, no masses, trachea mildline  Cardiovascular: RRR, no murmurs or rubs, no peripheral edema  Respiratory: clear to auscultation bilaterally, no wheezes, no crackles  Abdomen: soft, nontender, nondistended, normal bowel tones heard  Skin: dry, no rashes  Musculoskeletal: no joint effusions, normal range of motion  Psychiatric: appropriate affect, normal speech  Neurologic: extraocular muscles intact, garbled speech, moving all extremities with intact sensorium    Data Reviewed: CBC: Recent Labs  Lab 11/01/22 1249 11/02/22 0422 11/03/22 0359  WBC 5.9 7.5 13.1*  HGB 11.6* 11.7* 12.0*  HCT 35.5* 35.9* 36.5*   MCV 93.9 94.7 94.6  PLT 239 193 833    Basic Metabolic Panel: Recent Labs  Lab 11/01/22 1249 11/02/22 0422 11/03/22 0359  NA 134* 134* 137  K 4.0 4.0 3.8  CL 102 102 103  CO2 26 23 18*  GLUCOSE 112* 120* 164*  BUN 26* 23 31*  CREATININE 0.96 0.83 1.17  CALCIUM 8.7* 8.3* 8.6*  MG  --   --  1.9  PHOS  --   --  2.6    GFR: Estimated Creatinine Clearance: 33.6 mL/min (by C-G formula based on SCr of 1.17 mg/dL). Liver Function Tests: Recent Labs  Lab 11/03/22 0359  AST 52*  ALT 16  ALKPHOS 73  BILITOT 1.3*  PROT 7.0  ALBUMIN 3.6   No results for input(s): "LIPASE", "AMYLASE" in the last 168 hours. No results for input(s): "AMMONIA" in the last 168 hours. Coagulation Profile: No results for input(s): "INR", "PROTIME" in the last 168 hours. Cardiac Enzymes: No results for input(s): "CKTOTAL", "CKMB", "CKMBINDEX", "TROPONINI" in the last 168 hours. BNP (last 3 results) No results for input(s): "PROBNP" in the last 8760 hours. HbA1C: Recent Labs    11/03/22 0359  HGBA1C 5.7*   CBG: Recent Labs  Lab 11/03/22 0515  GLUCAP 179*   Lipid Profile:  No results for input(s): "CHOL", "HDL", "LDLCALC", "TRIG", "CHOLHDL", "LDLDIRECT" in the last 72 hours. Thyroid Function Tests: Recent Labs    11/03/22 0359  TSH 3.346   Anemia Panel: Recent Labs    11/03/22 0359  VITAMINB12 393   Urine analysis:    Component Value Date/Time   COLORURINE YELLOW (A) 02/09/2021 1256   APPEARANCEUR Cloudy (A) 10/27/2021 1338   LABSPEC 1.038 (H) 02/09/2021 1256   LABSPEC 1.015 01/02/2012 2312   PHURINE 6.0 02/09/2021 1256   GLUCOSEU Negative 10/27/2021 1338   GLUCOSEU Negative 01/02/2012 2312   HGBUR NEGATIVE 02/09/2021 1256   BILIRUBINUR Negative 10/27/2021 1338   BILIRUBINUR Negative 01/02/2012 2312   KETONESUR NEGATIVE 02/09/2021 1256   PROTEINUR Trace (A) 10/27/2021 1338   PROTEINUR NEGATIVE 02/09/2021 1256   NITRITE Positive (A) 10/27/2021 1338   NITRITE POSITIVE (A)  02/09/2021 1256   LEUKOCYTESUR 1+ (A) 10/27/2021 1338   LEUKOCYTESUR LARGE (A) 02/09/2021 1256   LEUKOCYTESUR Negative 01/02/2012 2312   Sepsis Labs: '@LABRCNTIP'$ (procalcitonin:4,lacticidven:4)  )No results found for this or any previous visit (from the past 240 hour(s)).   Studies: CT HEAD WO CONTRAST (5MM)  Result Date: 11/03/2022 CLINICAL DATA:  Mental status change, unknown cause EXAM: CT HEAD WITHOUT CONTRAST TECHNIQUE: Contiguous axial images were obtained from the base of the skull through the vertex without intravenous contrast. RADIATION DOSE REDUCTION: This exam was performed according to the departmental dose-optimization program which includes automated exposure control, adjustment of the mA and/or kV according to patient size and/or use of iterative reconstruction technique. COMPARISON:  04/13/2022 FINDINGS: Brain: There is atrophy and chronic small vessel disease changes. No acute intracranial abnormality. Specifically, no hemorrhage, hydrocephalus, mass lesion, acute infarction, or significant intracranial injury. Old left basal ganglia lacunar infarcts. Vascular: No hyperdense vessel or unexpected calcification. Skull: No acute calvarial abnormality. Sinuses/Orbits: No acute findings Other: None IMPRESSION: Old left basal ganglia lacunar infarcts. Atrophy, chronic microvascular disease. No acute intracranial abnormality. Electronically Signed   By: Rolm Baptise M.D.   On: 11/03/2022 02:00   DG FEMUR, MIN 2 VIEWS RIGHT  Result Date: 11/02/2022 CLINICAL DATA:  Postop right hip fracture EXAM: RIGHT FEMUR 2 VIEWS COMPARISON:  Radiographs dated November 01, 2022 FINDINGS: Status post ORIF with intramedullary nail placement for proximal right femoral fracture subcutaneous emphysema and surgical screws as expected. Prominent vascular calcifications. IMPRESSION: Status post ORIF with intramedullary nail placement for proximal right femoral fracture with postsurgical changes as expected.  Electronically Signed   By: Keane Police D.O.   On: 11/02/2022 17:01   DG FEMUR, MIN 2 VIEWS RIGHT  Result Date: 11/02/2022 CLINICAL DATA:  Status post right femoral fixation EXAM: RIGHT FEMUR 2 VIEWS COMPARISON:  None Available. FINDINGS: Six fluoroscopic images obtained during right femoral fixation. 1 minute 40 seconds fluoro time utilized. Radiation dose 17.77 mGy Kerma. Please see performing physicians operative report for full details IMPRESSION: Fluoroscopic images were obtained for intraoperative guidance of right femoral fixation. Electronically Signed   By: Darrin Nipper M.D.   On: 11/02/2022 16:51   DG C-Arm 1-60 Min-No Report  Result Date: 11/02/2022 Fluoroscopy was utilized by the requesting physician.  No radiographic interpretation.   DG C-Arm 1-60 Min-No Report  Result Date: 11/02/2022 Fluoroscopy was utilized by the requesting physician.  No radiographic interpretation.    Scheduled Meds:  acetaminophen  500 mg Oral Q6H   aspirin EC  81 mg Oral QHS   docusate sodium  100 mg Oral BID   enoxaparin (LOVENOX) injection  40 mg Subcutaneous Q24H   finasteride  5 mg Oral QPM   insulin aspart  0-9 Units Subcutaneous TID AC & HS   levothyroxine  50 mcg Oral QAC breakfast   pantoprazole  40 mg Oral Daily   rOPINIRole  1 mg Oral QHS   senna  1 tablet Oral BID   simvastatin  40 mg Oral QHS   traMADol  50 mg Oral Q6H    Continuous Infusions:  methocarbamol (ROBAXIN) IV     sodium bicarbonate 150 mEq in sterile water 1,150 mL infusion       LOS: 2 days   Time spent: 35 minutes  Cody Oliger Marry Guan, MD [facility] Pager [phone]  If 7PM-7AM, please contact night-coverage [email or web address] Password [facility] 11/03/2022, 10:42 AM

## 2022-11-03 NOTE — Progress Notes (Signed)
CROSS COVER NOTE  NAME: Jason Murphy MRN: [id number] DOB : [birth year]    HPI/Events of Note   Informed by RN patient remained confused, agitated, combative  and garbled speech since post surgery and was instructed to nitify the night provider if his symptoms did not clear  Assessment and  Interventions   Assessment: Bedside Neuro -alert and oriented to person and place, dysarthria notable and mild right labial flattening. MAE equally. Unable to assess atxaia 2/2 surgery and mits in place General - pale, dry oral mucosa CV - no edema SR  Lungs - sats stable with O2  2L [place] Review of labs shows mild hyponatremia at 135, K 4, creat 0.83 1/2 NS with 20 KCL infusing 75 ml/h Plan: CMP, Mag, Phos, B 12 Stat head CT - negative for acute findings. Old left nasal ganglia lacunar infarct; no pior findings with review of imaging in EPIC  MRI brain for f/u to above. esp given hx of prostate cancer  Acidosis on am labs - sodium bicarb infusion for 12 hours Elevated blood sugars, started sensitive sliding scale   Daughter Sydnee Cabal updated over phone of all above    Kathlene Cote NP [facility]

## 2022-11-03 NOTE — Plan of Care (Signed)

## 2022-11-03 NOTE — Progress Notes (Signed)
Patient refused all meds for the shift and was combative with staff. Per patient request he will only take meds with daughter arrives.

## 2022-11-03 NOTE — TOC Transition Note (Signed)
Transition of Care [facility]) - CM/SW Discharge Note   Patient Details  Name: KHALEEF RUBY MRN: [id number] Date of Birth: Feb 20, 1920  [facility]) CM/SW Contact:  Quin Hoop, LCSW Phone Number: 11/03/2022, 12:05 PM   Clinical Narrative:   CSW spoke with pt's daughter,  Collie Siad, to complete assessment.  Pt not good historian due to being oriented only to self.  Pt PCP is Dr. Oris Drone and transportation to appts provided by doctor's office.  Collie Siad not sure of pt's pharmacy.  Collie Siad lives out of town and her sister, [name], is local.  CSW phoned [name] and lvm.  Collie Siad states that that she'd like pt to go to a SNF, instead of receiving PT/OT at home [facility] only provides outpatient PT/OT).  CSW to start SNF referrals, [facility] ([id number] [id number]) and [facility].  TOC to follow-up with bed offers and bed choice.      Barriers to Discharge: Continued Medical Work up   Patient Goals and CMS Choice      Discharge Placement                         Discharge Plan and Services Additional resources added to the After Visit Summary for                                       Social Determinants of Health (SDOH) Interventions SDOH Screenings   Food Insecurity: No Food Insecurity (11/02/2022)  Housing: Low Risk  (11/02/2022)  Transportation Needs: No Transportation Needs (11/02/2022)  Utilities: Not At Risk (11/02/2022)  Tobacco Use: Low Risk  (11/03/2022)     Readmission Risk Interventions     No data to display

## 2022-11-04 DIAGNOSIS — S72144A Nondisplaced intertrochanteric fracture of right femur, initial encounter for closed fracture: Secondary | ICD-10-CM | POA: Diagnosis not present

## 2022-11-04 LAB — BASIC METABOLIC PANEL
Anion gap: 9 (ref 5–15)
BUN: 43 mg/dL — ABNORMAL HIGH (ref 8–23)
CO2: 22 mmol/L (ref 22–32)
Calcium: 8.4 mg/dL — ABNORMAL LOW (ref 8.9–10.3)
Chloride: 106 mmol/L (ref 98–111)
Creatinine, Ser: 1.03 mg/dL (ref 0.61–1.24)
GFR, Estimated: >60 mL/min (ref >60)
Glucose, Bld: 146 mg/dL — ABNORMAL HIGH (ref 70–99)
Potassium: 3.5 mmol/L (ref 3.5–5.1)
Sodium: 137 mmol/L (ref 135–145)

## 2022-11-04 LAB — CBC
HCT: 33.4 % — ABNORMAL LOW (ref 39.0–52.0)
Hemoglobin: 11.3 g/dL — ABNORMAL LOW (ref 13.0–17.0)
MCH: 31 pg (ref 26.0–34.0)
MCHC: 33.8 g/dL (ref 30.0–36.0)
MCV: 91.5 fL (ref 80.0–100.0)
Platelets: 228 10*3/uL (ref 150–400)
RBC: 3.65 MIL/uL — ABNORMAL LOW (ref 4.22–5.81)
RDW: 13.3 % (ref 11.5–15.5)
WBC: 11.6 10*3/uL — ABNORMAL HIGH (ref 4.0–10.5)
nRBC: 0 % (ref 0.0–0.2)

## 2022-11-04 LAB — GLUCOSE, CAPILLARY
Glucose-Capillary: 142 mg/dL — ABNORMAL HIGH (ref 70–99)
Glucose-Capillary: 140 mg/dL — ABNORMAL HIGH (ref 70–99)
Glucose-Capillary: 120 mg/dL — ABNORMAL HIGH (ref 70–99)

## 2022-11-04 MED ORDER — TRAMADOL HCL 50 MG PO TABS: 50.0000 mg | ORAL_TABLET | Freq: Four times a day (QID) | ORAL | 0 refills | Status: DC

## 2022-11-04 MED ORDER — DOCUSATE SODIUM 100 MG PO CAPS: 100.0000 mg | ORAL_CAPSULE | Freq: Two times a day (BID) | ORAL | 0 refills | Status: DC

## 2022-11-04 MED ORDER — BISACODYL 10 MG RE SUPP: 10.0000 mg | Freq: Every day | RECTAL | 0 refills | Status: DC | PRN

## 2022-11-04 MED ORDER — POLYETHYLENE GLYCOL 3350 17 G PO PACK: 17.0000 g | PACK | Freq: Every day | ORAL | 0 refills | Status: DC | PRN
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
## 2022-11-04 NOTE — TOC Progression Note (Signed)
Transition of Care [facility]) - Progression Note    Patient Details  Name: Jason Murphy MRN: [id number] Date of Birth: Jul 13, 1920  Transition of Care [facility]) CM/SW [place], LCSW Phone Number: 11/04/2022, 4:00 PM  Clinical Narrative:    Called pts dtr, Collie Siad, to inform her that [facility] is the only bed offer.  She accepted.  [place] and spoke with Raquel Sarna in admissions, however, she said that CSW would need to call Central Intake @ [phone] to confirm that bed offer is good.  Left message for [address], Central Intake for a return call before starting auth.   Expected Discharge Plan: [place] Barriers to Discharge: Continued Medical Work up  Expected Discharge Plan and Services       Living arrangements for the past 2 months: Single Family Home                                       Social Determinants of Health (SDOH) Interventions SDOH Screenings   Food Insecurity: No Food Insecurity (11/02/2022)  Housing: Low Risk  (11/02/2022)  Transportation Needs: No Transportation Needs (11/02/2022)  Utilities: Not At Risk (11/02/2022)  Tobacco Use: Low Risk  (11/03/2022)    Readmission Risk Interventions     No data to display

## 2022-11-04 NOTE — Progress Notes (Signed)
Pt alert with confusion. Needs redirecting throughout the shift. Thinks he is at home or going home. Keeps puling on purewick. IV wrapped for protection. Took medication without problems in water.

## 2022-11-04 NOTE — Progress Notes (Signed)
Attempted to give pt PO medication in applesauce. Pt aggressive and refusing medications.Pt asked that I call a family member to make them aware of what I needed to do. I called daughter. Daughter talked with pt. Pt continued to refuse assessment and medications. Daughter stated that pt does not want to take medications nor be assessed after talking with him.

## 2022-11-04 NOTE — Progress Notes (Signed)
Patient seen for cpap. Noted to be on nasal o2 in no distress. Patient easy to arouse and answered no when I asked him about wearing cpap. Patient is noted to have on green mitts on both hands.

## 2022-11-04 NOTE — Progress Notes (Signed)
Subjective:  POD #2 s/p intramedullary fixation for right IT hip fracture. Patient confused and is twisted up in bed.      Objective:   VITALS:   Vitals:   11/02/22 2200 11/03/22 0846 11/03/22 1711 11/04/22 0739  BP: (!) 138/93 (!) 141/67 123/87 129/87  Pulse: (!) 106 85 74 98  Resp:  '16 16 16  '$ Temp: 97.8 F (36.6 C) 98.3 F (36.8 C) 98.2 F (36.8 C) (!) 97 F (36.1 C)  TempSrc: Oral     SpO2: 97% (!) 89% 90% 96%  Weight:      Height:        PHYSICAL EXAM: Right lower extremity: Lower most dressing has been removed by patient.  Other dressings are clean Neurovascular intact Sensation intact distally Intact pulses distally Dorsiflexion/Plantar flexion intact Incision: no drainage No cellulitis present Compartment soft  LABS  Results for orders placed or performed during the hospital encounter of 11/01/22 (from the past 24 hour(s))  Glucose, capillary     Status: Abnormal   Collection Time: 11/03/22  5:10 PM  Result Value Ref Range   Glucose-Capillary 164 (H) 70 - 99 mg/dL  Glucose, capillary     Status: Abnormal   Collection Time: 11/03/22  9:40 PM  Result Value Ref Range   Glucose-Capillary 144 (H) 70 - 99 mg/dL  CBC     Status: Abnormal   Collection Time: 11/04/22  5:19 AM  Result Value Ref Range   WBC 11.6 (H) 4.0 - 10.5 K/uL   RBC 3.65 (L) 4.22 - 5.81 MIL/uL   Hemoglobin 11.3 (L) 13.0 - 17.0 g/dL   HCT 33.4 (L) 39.0 - 52.0 %   MCV 91.5 80.0 - 100.0 fL   MCH 31.0 26.0 - 34.0 pg   MCHC 33.8 30.0 - 36.0 g/dL   RDW 13.3 11.5 - 15.5 %   Platelets 228 150 - 400 K/uL   nRBC 0.0 0.0 - 0.2 %  Basic metabolic panel     Status: Abnormal   Collection Time: 11/04/22  5:19 AM  Result Value Ref Range   Sodium 137 135 - 145 mmol/L   Potassium 3.5 3.5 - 5.1 mmol/L   Chloride 106 98 - 111 mmol/L   CO2 22 22 - 32 mmol/L   Glucose, Bld 146 (H) 70 - 99 mg/dL   BUN 43 (H) 8 - 23 mg/dL   Creatinine, Ser 1.03 0.61 - 1.24 mg/dL   Calcium 8.4 (L) 8.9 - 10.3 mg/dL    GFR, Estimated >60 >60 mL/min   Anion gap 9 5 - 15  Glucose, capillary     Status: Abnormal   Collection Time: 11/04/22  7:35 AM  Result Value Ref Range   Glucose-Capillary 142 (H) 70 - 99 mg/dL  Glucose, capillary     Status: Abnormal   Collection Time: 11/04/22 11:57 AM  Result Value Ref Range   Glucose-Capillary 140 (H) 70 - 99 mg/dL    DG Chest 1 View  Result Date: 11/03/2022 CLINICAL DATA:  Hypoxia. EXAM: CHEST  1 VIEW COMPARISON:  Chest radiograph 11/01/2022 1314 hours. FINDINGS: 1010 hours. Right-greater-than-left patchy airspace opacities with associated bronchial wall thickening, may be of infectious/inflammatory etiology or represent mild pulmonary edema. Normal heart size. Stable mediastinal contours with atherosclerotic calcifications and tortuosity of the aorta. The left costophrenic sulcus is incompletely imaged. No right pleural effusion. No pneumothorax. IMPRESSION: 1. New right greater than left patchy airspace opacities with associated bronchial wall thickening may be of infectious or inflammatory  etiology or represent mild pulmonary edema. 2.  Aortic Atherosclerosis (ICD10-I70.0). Electronically Signed   By: Emmit Alexanders M.D.   On: 11/03/2022 10:44   CT HEAD WO CONTRAST (5MM)  Result Date: 11/03/2022 CLINICAL DATA:  Mental status change, unknown cause EXAM: CT HEAD WITHOUT CONTRAST TECHNIQUE: Contiguous axial images were obtained from the base of the skull through the vertex without intravenous contrast. RADIATION DOSE REDUCTION: This exam was performed according to the departmental dose-optimization program which includes automated exposure control, adjustment of the mA and/or kV according to patient size and/or use of iterative reconstruction technique. COMPARISON:  04/13/2022 FINDINGS: Brain: There is atrophy and chronic small vessel disease changes. No acute intracranial abnormality. Specifically, no hemorrhage, hydrocephalus, mass lesion, acute infarction, or significant  intracranial injury. Old left basal ganglia lacunar infarcts. Vascular: No hyperdense vessel or unexpected calcification. Skull: No acute calvarial abnormality. Sinuses/Orbits: No acute findings Other: None IMPRESSION: Old left basal ganglia lacunar infarcts. Atrophy, chronic microvascular disease. No acute intracranial abnormality. Electronically Signed   By: Rolm Baptise M.D.   On: 11/03/2022 02:00   DG FEMUR, MIN 2 VIEWS RIGHT  Result Date: 11/02/2022 CLINICAL DATA:  Postop right hip fracture EXAM: RIGHT FEMUR 2 VIEWS COMPARISON:  Radiographs dated November 01, 2022 FINDINGS: Status post ORIF with intramedullary nail placement for proximal right femoral fracture subcutaneous emphysema and surgical screws as expected. Prominent vascular calcifications. IMPRESSION: Status post ORIF with intramedullary nail placement for proximal right femoral fracture with postsurgical changes as expected. Electronically Signed   By: Keane Police D.O.   On: 11/02/2022 17:01   DG FEMUR, MIN 2 VIEWS RIGHT  Result Date: 11/02/2022 CLINICAL DATA:  Status post right femoral fixation EXAM: RIGHT FEMUR 2 VIEWS COMPARISON:  None Available. FINDINGS: Six fluoroscopic images obtained during right femoral fixation. 1 minute 40 seconds fluoro time utilized. Radiation dose 17.77 mGy Kerma. Please see performing physicians operative report for full details IMPRESSION: Fluoroscopic images were obtained for intraoperative guidance of right femoral fixation. Electronically Signed   By: Darrin Nipper M.D.   On: 11/02/2022 16:51   DG C-Arm 1-60 Min-No Report  Result Date: 11/02/2022 Fluoroscopy was utilized by the requesting physician.  No radiographic interpretation.   DG C-Arm 1-60 Min-No Report  Result Date: 11/02/2022 Fluoroscopy was utilized by the requesting physician.  No radiographic interpretation.    Assessment/Plan: 2 Days Post-Op   Principal Problem:   Femur fracture (HCC) Active Problems:   PROSTATE CANCER    OBSTRUCTIVE SLEEP APNEA   Essential hypertension   Paroxysmal A-fib (HCC)   Infrarenal abdominal aortic aneurysm (AAA) without rupture [facility])  Patient continues to have significant confusion.  Patient will need close monitoring.  He is WBAT on his right leg and should receive PT when he is able.   Continue lovenox.      [place]ton [place] , MD 11/04/2022, 1:55 PM

## 2022-11-04 NOTE — NC FL2 (Signed)
[place] LEVEL OF CARE FORM     IDENTIFICATION  Patient Name: Jason Murphy Birthdate: 07/16/1920 Sex: male Admission Date (Current Location): 11/01/2022  [facility] and [place] Number:  Engineering geologist and Address:  [facility], [address], [place], [place] [address]      Provider Number: [id number]  Attending Physician Name and Address:  Hollice Gong, [place] Name and Phone Number:  Collie Siad, daughter, [phone]    Current Level of Care:   Recommended Level of Care: [place] Prior Approval Number:    Date Approved/Denied:   PASRR Number: [id number]  Discharge Plan: SNF    Current Diagnoses: Patient Active Problem List   Diagnosis Date Noted   Femur fracture ([place]) 11/01/2022   Infrarenal abdominal aortic aneurysm (AAA) without rupture ([place]) 11/01/2022   Mild cognitive impairment of uncertain or unknown etiology 02/21/2022   TIA (transient ischemic attack) 02/21/2022   Protein-calorie malnutrition, severe 01/01/2021   AMS (altered mental status) 12/31/2020   Sepsis ([place]) 10/06/2020   Syncope    Acute cystitis without hematuria    Goals of care, counseling/discussion 08/15/2020   Hypothyroidism, adult 02/28/2018   Hypertension    Coronary artery disease    RLS (restless legs syndrome) 09/21/2017   Paroxysmal A-fib ([place]) 01/08/2016   Mixed hyperlipidemia 01/08/2016   Hyponatremia 01/03/2016   Anxiety state 02/03/2015   Stress at home 02/03/2015   OBSTRUCTIVE SLEEP APNEA 10/22/2010   PROSTATE CANCER 10/21/2010   HYPERCHOLESTEROLEMIA 10/21/2010   Essential hypertension 10/21/2010   CORONARY ARTERY DISEASE 10/21/2010   Lightheadedness 10/21/2010    Orientation RESPIRATION BLADDER Height & Weight     Self  O2 (nasal canula) Incontinent, External catheter Weight: 164 lb 11.2 oz (74.7 kg) Height:  '5\' 9"'$  (175.3 cm)  BEHAVIORAL SYMPTOMS/MOOD NEUROLOGICAL BOWEL NUTRITION STATUS      (forgetful)   Diet (Fluid consistency: Thin)  AMBULATORY STATUS COMMUNICATION OF NEEDS Skin   Limited Assist Verbally Normal (incision closed right hip)                       Personal Care Assistance Level of Assistance  Bathing, Feeding, Dressing Bathing Assistance: Limited assistance Feeding assistance: Limited assistance Dressing Assistance: Limited assistance     Functional Limitations Info             SPECIAL CARE FACTORS FREQUENCY  OT (By licensed OT), PT (By licensed PT)     PT Frequency: 5x's/day OT Frequency: 5x's/day            Contractures Contractures Info: Not present    Additional Factors Info  Code Status, Allergies Code Status Info: DNR Allergies Info: no known allergies           Current Medications (11/04/2022):  This is the current hospital active medication list Current Facility-Administered Medications  Medication Dose Route Frequency Provider Last Rate Last Admin   acetaminophen (TYLENOL) tablet 325-650 mg  325-650 mg Oral Q6H PRN Thornton Park, MD       alum & mag hydroxide-simeth (MAALOX/MYLANTA) 200-200-20 MG/5ML suspension 30 mL  30 mL Oral Q4H PRN Thornton Park, MD       aspirin EC tablet 81 mg  81 mg Oral QHS Thornton Park, MD   81 mg at 11/03/22 2138   bisacodyl (DULCOLAX) suppository 10 mg  10 mg Rectal Daily PRN Thornton Park, MD       docusate sodium (COLACE) capsule 100 mg  100 mg  Oral BID Thornton Park, MD   100 mg at 11/03/22 2138   enoxaparin (LOVENOX) injection 40 mg  40 mg Subcutaneous Q24H Thornton Park, MD       finasteride (PROSCAR) tablet 5 mg  5 mg Oral QPM Thornton Park, MD   5 mg at 11/03/22 1715   HYDROcodone-acetaminophen (NORCO/VICODIN) 5-325 MG per tablet 1-2 tablet  1-2 tablet Oral Q4H PRN Thornton Park, MD       insulin aspart (novoLOG) injection 0-9 Units  0-9 Units Subcutaneous TID AC & HS Sharion Settler, NP   1 Units at 11/03/22 2147   levothyroxine (SYNTHROID) tablet 50 mcg  50 mcg  Oral QAC breakfast Thornton Park, MD   50 mcg at 11/04/22 0518   menthol-cetylpyridinium (CEPACOL) lozenge 3 mg  1 lozenge Oral PRN Thornton Park, MD       Or   phenol (CHLORASEPTIC) mouth spray 1 spray  1 spray Mouth/Throat PRN Thornton Park, MD       methocarbamol (ROBAXIN) 500 mg in dextrose 5 % 50 mL IVPB  500 mg Intravenous Q6H PRN Thornton Park, MD       morphine (PF) 2 MG/ML injection 0.5-1 mg  0.5-1 mg Intravenous Q2H PRN Thornton Park, MD       ondansetron [facility]) tablet 4 mg  4 mg Oral Q6H PRN Thornton Park, MD       Or   ondansetron [facility]) injection 4 mg  4 mg Intravenous Q6H PRN Thornton Park, MD       pantoprazole (PROTONIX) EC tablet 40 mg  40 mg Oral Daily Thornton Park, MD       piperacillin-tazobactam (ZOSYN) IVPB 3.375 g  3.375 g Intravenous Q8H Hollice Gong, Mir Mohammed, MD 12.5 mL/hr at 11/04/22 0516 3.375 g at 11/04/22 0516   polyethylene glycol (MIRALAX / GLYCOLAX) packet 17 g  17 g Oral Daily PRN Thornton Park, MD       rOPINIRole (REQUIP) tablet 1 mg  1 mg Oral QHS Thornton Park, MD   1 mg at 11/03/22 2138   senna (SENOKOT) tablet 8.6 mg  1 tablet Oral BID Thornton Park, MD   8.6 mg at 11/03/22 2138   simvastatin (ZOCOR) tablet 40 mg  40 mg Oral QHS Thornton Park, MD   40 mg at 11/03/22 2138   traMADol (ULTRAM) tablet 50 mg  50 mg Oral Q6H Thornton Park, MD   50 mg at 11/04/22 0518     Discharge Medications: Please see discharge summary for a list of discharge medications.  Relevant Imaging Results:  Relevant Lab Results:   Additional Information SS#: [id number]  [place], LCSW

## 2022-11-04 NOTE — Progress Notes (Signed)
PT Cancellation Note  Patient Details Name: Jason Murphy MRN: [id number] DOB: 1920-05-16   Cancelled Treatment:     Attempted to see pt who was pleasantly  eating dinner. Pt assisted with positioning and a few bites. Nursing notified pt may need some assist. Continue PT tomorrow.  Continue to recommend SNF once medically stable.    Josie Dixon 11/04/2022, 4:34 PM

## 2022-11-04 NOTE — Progress Notes (Signed)
PROGRESS NOTE  JEREMI LOSITO DJS:[id number] DOB: 1920/02/02 DOA: 11/01/2022 PCP: Pcp, No  Hospital Course/Subjective: Jason Murphy is a 89 y.o. male with medical history significant of advanced metastatic prostate cancer, CAD, hypertension, hyperlipidemia, OSA, RLS, who presents to the ED due to right hip pain. He apparently tripped on something in the hall of his ALF while ambulating with his walker and unfortunately suffered right femoral neck fracture. He was seen by orthopedic surgery and they performed Intramedullary fixation for right intertrochanteric hip fracture without complication on 8/85. He had some garbled speech and agitation overnight and so CT scan of the head was obtained.  Doing well this AM, intermittently refusing meds but pleasant and cooperative, slowly eating breakfast this morning.  Assessment/Plan:  Principal Problem:   Femur fracture (HCC) Active Problems:   Essential hypertension   Paroxysmal A-fib (HCC)   PROSTATE CANCER   OBSTRUCTIVE SLEEP APNEA   Infrarenal abdominal aortic aneurysm (AAA) without rupture (HCC)   Assessment and Plan: * Femur fracture (HCC) Patient is presenting after a ground-level fall that was mechanical in nature with CT imaging demonstrating an intertrochanteric fracture.  Patient has evidence of diffuse sclerotic lesions consistent with metastatic prostate cancer.  No contraindication to move forward with femur repair. - Orthopedic surgery following, appreciate their management and recommendations - Pain controlled on PO meds - Postoperative PT/OT, they recommend subacute nursing facility - [facility] consultation for placement assistance -Orthopedic surgery recommends Lovenox for DVT prophylaxis  Hospital delirium Likely exacerbated by recent surgery, postop pain, etc. Patient is moving all extremities, speaking though slightly garbled. Continue supportive care, discussed with daughter [name] and she requests no further workup.  She  made the comment that "the goal is to keep him comfortable" CT head from 1/17 reviewed, no acute process; cancel MRI brain. UA was ordered overnight not collected  yet, I will also check CXR to rule out infection. -- Update: patchy opacities on Chest Xray, will treat empirically with Zosyn  HCAP Patient is afebrile but marginally hypoxic (89% on room air) and has white count with CXR opacities. Will treat empirically with Zosyn to cover possible HCAP, white count is improving  Essential hypertension Patient has a history of hypertension but is not currently on any medication for this.  Blood pressure initially elevated due to pain but is now back to normal range. - No indication for antihypertensives at this time. - Pain control as noted above  Paroxysmal A-fib [facility]) Patient has a history of paroxysmal atrial fibrillation, currently in sinus rhythm.  He is not on any anticoagulation or rate controlling agents.  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  He is currently on Eligard with last dose in May 2023.  Evidence of progressive sclerotic lesions on imaging today. - Continue outpatient follow-up with urology  OBSTRUCTIVE SLEEP APNEA - Continue home CPAP  Infrarenal abdominal aortic aneurysm (AAA) without rupture (HCC) On CT imaging 1/16, there was demonstration of a 5.3 x 5.6 cm infrarenal AAA with no evidence of rupture.  Previous imaging was reviewed, with CT in 2021 that demonstrated a 5 cm AAA.  Admitting MD discussed with vascular surgery, no need for any further evaluation or intervention prior to femur repair tomorrow morning. - Recommend outpatient follow-up with vascular surgery for further monitoring  DVT Prophylaxis: Lovenox per orthopedic surgery  Code Status: DNR   Family Communication: Discussed with daughter [name] over the phone 1/18 morning.  Disposition Plan: Anticipate rehab  placement  Consultants: Orthopedic Surgery   Procedures: Intramedullary fixation for right intertrochanteric hip fracture 1/17   Antimicrobials: Anti-infectives (From admission, onward)    Start     Dose/Rate Route Frequency Ordered Stop   11/03/22 1400  piperacillin-tazobactam (ZOSYN) IVPB 3.375 g        3.375 g 12.5 mL/hr over 240 Minutes Intravenous Every 8 hours 11/03/22 1221     11/02/22 2100  ceFAZolin (ANCEF) IVPB 2g/100 mL premix        2 g 200 mL/hr over 30 Minutes Intravenous Every 6 hours 11/02/22 1732 11/03/22 0859   11/02/22 0000  ceFAZolin (ANCEF) IVPB 2g/100 mL premix        2 g 200 mL/hr over 30 Minutes Intravenous 30 min pre-op 11/01/22 1802 11/02/22 1518       Objective: Vitals:   11/02/22 2200 11/03/22 0846 11/03/22 1711 11/04/22 0739  BP: (!) 138/93 (!) 141/67 123/87 129/87  Pulse: (!) 106 85 74 98  Resp:  '16 16 16  '$ Temp: 97.8 F (36.6 C) 98.3 F (36.8 C) 98.2 F (36.8 C) (!) 97 F (36.1 C)  TempSrc: Oral     SpO2: 97% (!) 89% 90% 96%  Weight:      Height:        Intake/Output Summary (Last 24 hours) at 11/04/2022 1125 Last data filed at 11/03/2022 2100 Gross per 24 hour  Intake 17.7 ml  Output 600 ml  Net -582.3 ml    Filed Weights   11/01/22 1251  Weight: 74.7 kg   Exam: General:  Alert, oriented to self and place, feeding himself breakfast this morning Eyes: EOMI, clear conjuctivae, white sclerea Neck: supple, no masses, trachea mildline  Cardiovascular: RRR, no murmurs or rubs, no peripheral edema  Respiratory: clear to auscultation bilaterally, no wheezes, no crackles  Abdomen: soft, nontender, nondistended, normal bowel tones heard  Skin: dry, no rashes  Musculoskeletal: no joint effusions, normal range of motion  Psychiatric: appropriate affect, normal speech  Neurologic: extraocular muscles intact, garbled speech, moving all extremities with intact sensorium    Data Reviewed: CBC: Recent Labs  Lab 11/01/22 1249  11/02/22 0422 11/03/22 0359 11/04/22 0519  WBC 5.9 7.5 13.1* 11.6*  HGB 11.6* 11.7* 12.0* 11.3*  HCT 35.5* 35.9* 36.5* 33.4*  MCV 93.9 94.7 94.6 91.5  PLT 239 193 221 528    Basic Metabolic Panel: Recent Labs  Lab 11/01/22 1249 11/02/22 0422 11/03/22 0359 11/04/22 0519  NA 134* 134* 137 137  K 4.0 4.0 3.8 3.5  CL 102 102 103 106  CO2 26 23 18* 22  GLUCOSE 112* 120* 164* 146*  BUN 26* 23 31* 43*  CREATININE 0.96 0.83 1.17 1.03  CALCIUM 8.7* 8.3* 8.6* 8.4*  MG  --   --  1.9  --   PHOS  --   --  2.6  --     GFR: Estimated Creatinine Clearance: 38.1 mL/min (by C-G formula based on SCr of 1.03 mg/dL). Liver Function Tests: Recent Labs  Lab 11/03/22 0359  AST 52*  ALT 16  ALKPHOS 73  BILITOT 1.3*  PROT 7.0  ALBUMIN 3.6    No results for input(s): "LIPASE", "AMYLASE" in the last 168 hours. No results for input(s): "AMMONIA" in the last 168 hours. Coagulation Profile: No results for input(s): "INR", "PROTIME" in the last 168 hours. Cardiac Enzymes: No results for input(s): "CKTOTAL", "CKMB", "CKMBINDEX", "TROPONINI" in the last 168 hours. BNP (last 3 results) No results for input(s): "PROBNP" in the  last 8760 hours. HbA1C: Recent Labs    11/03/22 0359  HGBA1C 5.7*    CBG: Recent Labs  Lab 11/03/22 0515 11/03/22 1710 11/03/22 2140 11/04/22 0735  GLUCAP 179* 164* 144* 142*    Lipid Profile: No results for input(s): "CHOL", "HDL", "LDLCALC", "TRIG", "CHOLHDL", "LDLDIRECT" in the last 72 hours. Thyroid Function Tests: Recent Labs    11/03/22 0359  TSH 3.346    Anemia Panel: Recent Labs    11/03/22 0359  VITAMINB12 393    Urine analysis:    Component Value Date/Time   COLORURINE YELLOW (A) 02/09/2021 1256   APPEARANCEUR Cloudy (A) 10/27/2021 1338   LABSPEC 1.038 (H) 02/09/2021 1256   LABSPEC 1.015 01/02/2012 2312   PHURINE 6.0 02/09/2021 1256   GLUCOSEU Negative 10/27/2021 1338   GLUCOSEU Negative 01/02/2012 2312   HGBUR NEGATIVE  02/09/2021 1256   BILIRUBINUR Negative 10/27/2021 1338   BILIRUBINUR Negative 01/02/2012 2312   KETONESUR NEGATIVE 02/09/2021 1256   PROTEINUR Trace (A) 10/27/2021 1338   PROTEINUR NEGATIVE 02/09/2021 1256   NITRITE Positive (A) 10/27/2021 1338   NITRITE POSITIVE (A) 02/09/2021 1256   LEUKOCYTESUR 1+ (A) 10/27/2021 1338   LEUKOCYTESUR LARGE (A) 02/09/2021 1256   LEUKOCYTESUR Negative 01/02/2012 2312   Sepsis Labs: '@LABRCNTIP'$ (procalcitonin:4,lacticidven:4)  )No results found for this or any previous visit (from the past 240 hour(s)).   Studies: No results found.  Scheduled Meds:  aspirin EC  81 mg Oral QHS   docusate sodium  100 mg Oral BID   enoxaparin (LOVENOX) injection  40 mg Subcutaneous Q24H   finasteride  5 mg Oral QPM   insulin aspart  0-9 Units Subcutaneous TID AC & HS   levothyroxine  50 mcg Oral QAC breakfast   pantoprazole  40 mg Oral Daily   rOPINIRole  1 mg Oral QHS   senna  1 tablet Oral BID   simvastatin  40 mg Oral QHS   traMADol  50 mg Oral Q6H    Continuous Infusions:  methocarbamol (ROBAXIN) IV     piperacillin-tazobactam (ZOSYN)  IV 3.375 g (11/04/22 0516)     LOS: 3 days   Time spent: 35 minutes  Graves Nipp Marry Guan, MD [facility] Pager [phone]  If 7PM-7AM, please contact night-coverage [email or web address] Password [facility] 11/04/2022, 11:25 AM

## 2022-11-05 DIAGNOSIS — S72144A Nondisplaced intertrochanteric fracture of right femur, initial encounter for closed fracture: Secondary | ICD-10-CM | POA: Diagnosis not present

## 2022-11-05 LAB — CBC
HCT: 34.3 % — ABNORMAL LOW (ref 39.0–52.0)
Hemoglobin: 11.3 g/dL — ABNORMAL LOW (ref 13.0–17.0)
MCH: 31 pg (ref 26.0–34.0)
MCHC: 32.9 g/dL (ref 30.0–36.0)
MCV: 94.2 fL (ref 80.0–100.0)
Platelets: 242 10*3/uL (ref 150–400)
RBC: 3.64 MIL/uL — ABNORMAL LOW (ref 4.22–5.81)
RDW: 13.6 % (ref 11.5–15.5)
WBC: 8.2 10*3/uL (ref 4.0–10.5)
nRBC: 0 % (ref 0.0–0.2)

## 2022-11-05 LAB — BASIC METABOLIC PANEL
Anion gap: 8 (ref 5–15)
BUN: 45 mg/dL — ABNORMAL HIGH (ref 8–23)
CO2: 24 mmol/L (ref 22–32)
Calcium: 8.4 mg/dL — ABNORMAL LOW (ref 8.9–10.3)
Chloride: 108 mmol/L (ref 98–111)
Creatinine, Ser: 0.94 mg/dL (ref 0.61–1.24)
GFR, Estimated: >60 mL/min (ref >60)
Glucose, Bld: 117 mg/dL — ABNORMAL HIGH (ref 70–99)
Potassium: 3.5 mmol/L (ref 3.5–5.1)
Sodium: 140 mmol/L (ref 135–145)

## 2022-11-05 LAB — GLUCOSE, CAPILLARY
Glucose-Capillary: 150 mg/dL — ABNORMAL HIGH (ref 70–99)
Glucose-Capillary: 137 mg/dL — ABNORMAL HIGH (ref 70–99)
Glucose-Capillary: 104 mg/dL — ABNORMAL HIGH (ref 70–99)
Glucose-Capillary: 116 mg/dL — ABNORMAL HIGH (ref 70–99)

## 2022-11-05 NOTE — Progress Notes (Addendum)
Pt was attempting to return to bed without assist upon arrival. Author performed squat pivot transfer recliner> EOB (heavy max>total). Max assist to reposition towards HOB. Lunch tray positioned to encourage pt eating. RN staff aware he may require some assistance eating. Bed alarm in place, call bell in reach, and pt pleasantly appreciating assistance.

## 2022-11-05 NOTE — Plan of Care (Signed)
  Problem: Health Behavior/Discharge Planning: Goal: Ability to manage health-related needs will improve Outcome: Progressing   Problem: Clinical Measurements: Goal: Ability to maintain clinical measurements within normal limits will improve Outcome: Progressing Goal: Will remain free from infection Outcome: Progressing   Problem: Activity: Goal: Risk for activity intolerance will decrease Outcome: Progressing   Problem: Nutrition: Goal: Adequate nutrition will be maintained Outcome: Progressing   Problem: Coping: Goal: Level of anxiety will decrease Outcome: Progressing   Problem: Pain Managment: Goal: General experience of comfort will improve Outcome: Progressing   Problem: Safety: Goal: Ability to remain free from injury will improve Outcome: Progressing   Problem: Skin Integrity: Goal: Risk for impaired skin integrity will decrease Outcome: Progressing   

## 2022-11-05 NOTE — Progress Notes (Signed)
PROGRESS NOTE  BING DUFFEY KDT:[id number] DOB: [birth year] DOA: 11/01/2022 PCP: Pcp, No  Hospital Course/Subjective: Jason Murphy is a 89 y.o. male with medical history significant of advanced metastatic prostate cancer, CAD, hypertension, hyperlipidemia, OSA, RLS, who presents to the ED due to right hip pain. He apparently tripped on something in the hall of his ALF while ambulating with his walker and unfortunately suffered right femoral neck fracture. He was seen by orthopedic surgery and they performed Intramedullary fixation for right intertrochanteric hip fracture without complication on 9/98. He had some garbled speech and agitation overnight and so CT scan of the head was obtained.  Doing well this AM, his temp is 95.3. He feels well and has no complaints. Slightly confused which is his baseline, he has been more cooperative in general today. SItting up in bedside chair and having breakfast.  Assessment/Plan:  Principal Problem:   Femur fracture (HCC) Active Problems:   Essential hypertension   Paroxysmal A-fib (HCC)   PROSTATE CANCER   OBSTRUCTIVE SLEEP APNEA   Infrarenal abdominal aortic aneurysm (AAA) without rupture (HCC)   Assessment and Plan: * Femur fracture (HCC) Patient is presenting after a ground-level fall that was mechanical in nature with CT imaging demonstrating an intertrochanteric fracture.  Patient has evidence of diffuse sclerotic lesions consistent with metastatic prostate cancer. Status post IM nail 1/17. - Orthopedic surgery following, appreciate their management and recommendations - Pain controlled on PO meds - Postoperative PT/OT, they recommend subacute nursing facility - [facility] consultation for placement assistance, pending - Orthopedic surgery recommends Lovenox for DVT prophylaxis  Hospital delirium Likely exacerbated by recent surgery, postop pain, etc. Patient is moving all extremities, speaking though slightly garbled. -- Update: patchy  opacities on Chest Xray, will treat empirically with Zosyn  HCAP Patient is afebrile but marginally hypoxic (89% on room air) and has white count with CXR opacities. Will treat empirically with Zosyn to cover possible HCAP, white count is improving  Essential hypertension Patient has a history of hypertension but is not currently on any medication for this.  Blood pressure initially elevated due to pain but is now back to normal range. - No indication for antihypertensives at this time. - Pain control as noted above  Paroxysmal A-fib [facility]) Patient has a history of paroxysmal atrial fibrillation, currently in sinus rhythm.  He is not on any anticoagulation or rate controlling agents.  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  He is currently on Eligard with last dose in May 2023.  Evidence of progressive sclerotic lesions on imaging today. - Continue outpatient follow-up with urology  OBSTRUCTIVE SLEEP APNEA - Continue home CPAP  Infrarenal abdominal aortic aneurysm (AAA) without rupture (HCC) On CT imaging 1/16, there was demonstration of a 5.3 x 5.6 cm infrarenal AAA with no evidence of rupture.  Previous imaging was reviewed, with CT in 2021 that demonstrated a 5 cm AAA.  Admitting MD discussed with vascular surgery, no need for any further evaluation or intervention prior to femur repair tomorrow morning. - Recommend outpatient follow-up with vascular surgery for further monitoring  DVT Prophylaxis: Lovenox per orthopedic surgery  Code Status: DNR   Family Communication: Discussed with daughter [name] over the phone 1/18.  Disposition Plan: Anticipate rehab placement  Consultants: Orthopedic Surgery   Procedures: Intramedullary fixation for right intertrochanteric hip fracture 1/17   Antimicrobials: Anti-infectives (From admission, onward)    Start     Dose/Rate Route Frequency Ordered Stop  11/03/22  1400  piperacillin-tazobactam (ZOSYN) IVPB 3.375 g        3.375 g 12.5 mL/hr over 240 Minutes Intravenous Every 8 hours 11/03/22 1221 11/10/22 1359   11/02/22 2100  ceFAZolin (ANCEF) IVPB 2g/100 mL premix        2 g 200 mL/hr over 30 Minutes Intravenous Every 6 hours 11/02/22 1732 11/03/22 0859   11/02/22 0000  ceFAZolin (ANCEF) IVPB 2g/100 mL premix        2 g 200 mL/hr over 30 Minutes Intravenous 30 min pre-op 11/01/22 1802 11/02/22 1518       Objective: Vitals:   11/03/22 1711 11/04/22 0739 11/04/22 1554 11/05/22 0730  BP: 123/87 129/87 124/60 (!) 142/94  Pulse: 74 98 64 (!) 58  Resp: '16 16 17 16  '$ Temp: 98.2 F (36.8 C) (!) 97 F (36.1 C)  (!) 95.3 F (35.2 C)  TempSrc:      SpO2: 90% 96% 98% 97%  Weight:      Height:        Intake/Output Summary (Last 24 hours) at 11/05/2022 0945 Last data filed at 11/05/2022 0500 Gross per 24 hour  Intake --  Output 700 ml  Net -700 ml    Filed Weights   11/01/22 1251  Weight: 74.7 kg   Exam: General:  Alert, oriented to self and hopsital, calm, in no acute distress, sitting up eating breakfast Eyes: EOMI, clear conjuctivae, white sclerea Neck: supple, no masses, trachea mildline  Cardiovascular: RRR, no murmurs or rubs, no peripheral edema  Respiratory: clear to auscultation bilaterally, no wheezes, no crackles  Abdomen: soft, nontender, nondistended, normal bowel tones heard  Skin: dry, no rashes  Musculoskeletal: no joint effusions, normal range of motion  Psychiatric: appropriate affect, normal speech  Neurologic: extraocular muscles intact, clear speech, moving all extremities with intact sensorium   Data Reviewed: CBC: Recent Labs  Lab 11/01/22 1249 11/02/22 0422 11/03/22 0359 11/04/22 0519 11/05/22 0505  WBC 5.9 7.5 13.1* 11.6* 8.2  HGB 11.6* 11.7* 12.0* 11.3* 11.3*  HCT 35.5* 35.9* 36.5* 33.4* 34.3*  MCV 93.9 94.7 94.6 91.5 94.2  PLT 239 193 221 228 626    Basic Metabolic Panel: Recent Labs  Lab  11/01/22 1249 11/02/22 0422 11/03/22 0359 11/04/22 0519 11/05/22 0505  NA 134* 134* 137 137 140  K 4.0 4.0 3.8 3.5 3.5  CL 102 102 103 106 108  CO2 26 23 18* 22 24  GLUCOSE 112* 120* 164* 146* 117*  BUN 26* 23 31* 43* 45*  CREATININE 0.96 0.83 1.17 1.03 0.94  CALCIUM 8.7* 8.3* 8.6* 8.4* 8.4*  MG  --   --  1.9  --   --   PHOS  --   --  2.6  --   --     GFR: Estimated Creatinine Clearance: 41.8 mL/min (by C-G formula based on SCr of 0.94 mg/dL). Liver Function Tests: Recent Labs  Lab 11/03/22 0359  AST 52*  ALT 16  ALKPHOS 73  BILITOT 1.3*  PROT 7.0  ALBUMIN 3.6    No results for input(s): "LIPASE", "AMYLASE" in the last 168 hours. No results for input(s): "AMMONIA" in the last 168 hours. Coagulation Profile: No results for input(s): "INR", "PROTIME" in the last 168 hours. Cardiac Enzymes: No results for input(s): "CKTOTAL", "CKMB", "CKMBINDEX", "TROPONINI" in the last 168 hours. BNP (last 3 results) No results for input(s): "PROBNP" in the last 8760 hours. HbA1C: Recent Labs    11/03/22 0359  HGBA1C 5.7*  CBG: Recent Labs  Lab 11/03/22 2140 11/04/22 0735 11/04/22 1157 11/04/22 1717 11/05/22 0734  GLUCAP 144* 142* 140* 120* 150*    Lipid Profile: No results for input(s): "CHOL", "HDL", "LDLCALC", "TRIG", "CHOLHDL", "LDLDIRECT" in the last 72 hours. Thyroid Function Tests: Recent Labs    11/03/22 0359  TSH 3.346    Anemia Panel: Recent Labs    11/03/22 0359  VITAMINB12 393    Urine analysis:    Component Value Date/Time   COLORURINE YELLOW (A) 02/09/2021 1256   APPEARANCEUR Cloudy (A) 10/27/2021 1338   LABSPEC 1.038 (H) 02/09/2021 1256   LABSPEC 1.015 01/02/2012 2312   PHURINE 6.0 02/09/2021 1256   GLUCOSEU Negative 10/27/2021 1338   GLUCOSEU Negative 01/02/2012 2312   HGBUR NEGATIVE 02/09/2021 1256   BILIRUBINUR Negative 10/27/2021 1338   BILIRUBINUR Negative 01/02/2012 2312   KETONESUR NEGATIVE 02/09/2021 1256   PROTEINUR Trace  (A) 10/27/2021 1338   PROTEINUR NEGATIVE 02/09/2021 1256   NITRITE Positive (A) 10/27/2021 1338   NITRITE POSITIVE (A) 02/09/2021 1256   LEUKOCYTESUR 1+ (A) 10/27/2021 1338   LEUKOCYTESUR LARGE (A) 02/09/2021 1256   LEUKOCYTESUR Negative 01/02/2012 2312   Sepsis Labs: '@LABRCNTIP'$ (procalcitonin:4,lacticidven:4)  )No results found for this or any previous visit (from the past 240 hour(s)).   Studies: No results found.  Scheduled Meds:  aspirin EC  81 mg Oral QHS   docusate sodium  100 mg Oral BID   enoxaparin (LOVENOX) injection  40 mg Subcutaneous Q24H   finasteride  5 mg Oral QPM   insulin aspart  0-9 Units Subcutaneous TID AC & HS   levothyroxine  50 mcg Oral QAC breakfast   pantoprazole  40 mg Oral Daily   rOPINIRole  1 mg Oral QHS   senna  1 tablet Oral BID   simvastatin  40 mg Oral QHS   traMADol  50 mg Oral Q6H    Continuous Infusions:  methocarbamol (ROBAXIN) IV     piperacillin-tazobactam (ZOSYN)  IV 3.375 g (11/05/22 0509)     LOS: 4 days   Time spent: 35 minutes  Rehanna Oloughlin Marry Guan, MD [facility] Pager [phone]  If 7PM-7AM, please contact night-coverage [email or web address] Password [facility] 11/05/2022, 9:45 AM

## 2022-11-05 NOTE — Progress Notes (Signed)
Subjective:  Patient reports pain as mild.  Alert and cooperative, some confusion which is baseline.  Objective:   VITALS:   Vitals:   11/03/22 1711 11/04/22 0739 11/04/22 1554 11/05/22 0730  BP: 123/87 129/87 124/60 (!) 142/94  Pulse: 74 98 64 (!) 58  Resp: '16 16 17 16  '$ Temp: 98.2 F (36.8 C) (!) 97 F (36.1 C)  (!) 95.3 F (35.2 C)  TempSrc:      SpO2: 90% 96% 98% 97%  Weight:      Height:        PHYSICAL EXAM:  Sensation intact distally Dorsiflexion/Plantar flexion intact Incision: dressing C/D/I No cellulitis present Compartment soft  LABS  Results for orders placed or performed during the hospital encounter of 11/01/22 (from the past 24 hour(s))  Glucose, capillary     Status: Abnormal   Collection Time: 11/04/22 11:57 AM  Result Value Ref Range   Glucose-Capillary 140 (H) 70 - 99 mg/dL  Glucose, capillary     Status: Abnormal   Collection Time: 11/04/22  5:17 PM  Result Value Ref Range   Glucose-Capillary 120 (H) 70 - 99 mg/dL  CBC     Status: Abnormal   Collection Time: 11/05/22  5:05 AM  Result Value Ref Range   WBC 8.2 4.0 - 10.5 K/uL   RBC 3.64 (L) 4.22 - 5.81 MIL/uL   Hemoglobin 11.3 (L) 13.0 - 17.0 g/dL   HCT 34.3 (L) 39.0 - 52.0 %   MCV 94.2 80.0 - 100.0 fL   MCH 31.0 26.0 - 34.0 pg   MCHC 32.9 30.0 - 36.0 g/dL   RDW 13.6 11.5 - 15.5 %   Platelets 242 150 - 400 K/uL   nRBC 0.0 0.0 - 0.2 %  Basic metabolic panel     Status: Abnormal   Collection Time: 11/05/22  5:05 AM  Result Value Ref Range   Sodium 140 135 - 145 mmol/L   Potassium 3.5 3.5 - 5.1 mmol/L   Chloride 108 98 - 111 mmol/L   CO2 24 22 - 32 mmol/L   Glucose, Bld 117 (H) 70 - 99 mg/dL   BUN 45 (H) 8 - 23 mg/dL   Creatinine, Ser 0.94 0.61 - 1.24 mg/dL   Calcium 8.4 (L) 8.9 - 10.3 mg/dL   GFR, Estimated >60 >60 mL/min   Anion gap 8 5 - 15  Glucose, capillary     Status: Abnormal   Collection Time: 11/05/22  7:34 AM  Result Value Ref Range   Glucose-Capillary 150 (H) 70 - 99  mg/dL    DG Chest 1 View  Result Date: 11/03/2022 CLINICAL DATA:  Hypoxia. EXAM: CHEST  1 VIEW COMPARISON:  Chest radiograph 11/01/2022 1314 hours. FINDINGS: 1010 hours. Right-greater-than-left patchy airspace opacities with associated bronchial wall thickening, may be of infectious/inflammatory etiology or represent mild pulmonary edema. Normal heart size. Stable mediastinal contours with atherosclerotic calcifications and tortuosity of the aorta. The left costophrenic sulcus is incompletely imaged. No right pleural effusion. No pneumothorax. IMPRESSION: 1. New right greater than left patchy airspace opacities with associated bronchial wall thickening may be of infectious or inflammatory etiology or represent mild pulmonary edema. 2.  Aortic Atherosclerosis (ICD10-I70.0). Electronically Signed   By: Emmit Alexanders M.D.   On: 11/03/2022 10:44    Assessment/Plan: 3 Days Post-Op   Principal Problem:   Femur fracture (HCC) Active Problems:   PROSTATE CANCER   OBSTRUCTIVE SLEEP APNEA   Essential hypertension   Paroxysmal A-fib (HCC)   Infrarenal  abdominal aortic aneurysm (AAA) without rupture [facility])   Up with therapy Discharge to SNF when cleared by medical team and bed available   Lovell Sheehan , MD 11/05/2022, 10:25 AM

## 2022-11-05 NOTE — Progress Notes (Addendum)
Physical Therapy Treatment Patient Details Name: Jason Murphy MRN: [id number] DOB: Sep 23, 1920 Today's Date: 11/05/2022   History of Present Illness 89 y/o male presented to ED on 11/01/22 following a fall where he sustained a R intertrochanteric fx. S/p R femur IM nail on 1/17. PMH: advanced metastatic prostate cancer, CAD, HTN, OSA    PT Comments    Pt had removed purwick and O2 and was frustrated upon arriving. He is easily redirected with pt's mood improving throughout the remainder of session. He is able to follow commands with increased time. Overall requiring extensive assistance to perform all desired task. Pt is eager to get OOB to sit in recliner. +2 assistance throughout session for pt/staff safety. He was able to stand to RW and take a few steps to recliner but limited activity tolerance due to RLE weakness/buckling with LLE advancement. Will continue efforts to progress wt bearing abilities/strength per current POC. Will require +2 assist for all future mobility until his abilities improve. SNF most appropriate DC disposition.  Add: 1010 am: Chief Strategy Officer returned after being contacted by Conservation officer, historic buildings for assistance with pt. Pt was sitting on BSC after successful BM. He required +2 assistance to stand to RW to allow hygiene care to be performed. Pt sat back on BSC prior to squat pivot from [facility] to recliner via total assist. Pt tolerated well. RN staff requested pt remain in recliner. Acute PT will continue to follow    Recommendations for follow up therapy are one component of a multi-disciplinary discharge planning process, led by the attending physician.  Recommendations may be updated based on patient status, additional functional criteria and insurance authorization.  Follow Up Recommendations  Skilled nursing-short term rehab (<3 hours/day)     Assistance Recommended at Discharge Frequent or constant Supervision/Assistance  Patient can return home with the following Two people to help with  walking and/or transfers;Two people to help with bathing/dressing/bathroom;Assistance with cooking/housework;Direct supervision/assist for medications management;Assistance with feeding;Direct supervision/assist for financial management;Assist for transportation;Help with stairs or ramp for entrance   Equipment Recommendations  Hospital bed;Wheelchair cushion (measurements PT);Wheelchair (measurements PT);Rolling walker (2 wheels);BSC/3in1       Precautions / Restrictions Precautions Precautions: Fall Restrictions Weight Bearing Restrictions: Yes RLE Weight Bearing: Weight bearing as tolerated     Mobility  Bed Mobility Overal bed mobility: Needs Assistance Bed Mobility: Supine to Sit  Supine to sit: Max assist, HOB elevated  General bed mobility comments: INcreased time + vcs to progress to EOB. pain limiting but once positioned EOB, pt was able to sit with supervision. Heavy R lateral lean. encouraged leaning away form affected LE    Transfers Overall transfer level: Needs assistance Equipment used: Rolling walker (2 wheels) (used bariatric RW in room but does not need bariatric size) Transfers: Sit to/from Stand, Bed to chair/wheelchair/BSC Sit to Stand: Max assist, +2 safety/equipment, From elevated surface, +2 physical assistance Stand pivot transfers: +2 safety/equipment, +2 physical assistance, Max assist         General transfer comment: Pt stood 1 x EOB and then took several very antalic steps form EOB to recliner. RLE buckling with advancement of LLE. +2 max assist. Pryor Curia will return shortly to assist RN staff with returnign pt to bed from lower recliner surface. Pt did stand x ~ 2 minutes while RN care provided for hygiene care and sacral pad replacement    Ambulation/Gait Ambulation/Gait assistance: Max assist, +2 physical assistance, +2 safety/equipment Gait Distance (Feet): 3 Feet Assistive device: Rolling walker (2 wheels)  Gait Pattern/deviations: Step-to  pattern, Antalgic, Knees buckling (RLE buckling) Gait velocity: decreased     General Gait Details: pt was able to take a few steps however very antalgic requiring +2 max assist due to RLE buckling    Balance Overall balance assessment: Needs assistance Sitting-balance support: Bilateral upper extremity supported, Feet supported Sitting balance-Leahy Scale: Poor Sitting balance - Comments: severe R lateral lean while siting EOB   Standing balance support: Bilateral upper extremity supported, During functional activity, Reliant on assistive device for balance Standing balance-Leahy Scale: Poor Standing balance comment: Constant assistance required to  static stand, +2 for any/all dynamic task        Cognition Arousal/Alertness: Awake/alert Behavior During Therapy: WFL for tasks assessed/performed, Agitated Overall Cognitive Status: No family/caregiver present to determine baseline cognitive functioning    General Comments: Pt was slightly agitated upon arrival but easil redirected and with care becomes pleasant. +2 in room throughout (RN + Chief Strategy Officer)           General Comments General comments (skin integrity, edema, etc.): Pt was assisted with applying dentures, gown change, repsoitioning in recliner, and LEs elevated. Pt was coopertive this session overall. awaying breakfast tray. will need assistance with eating. RN staff aware      Pertinent Vitals/Pain Pain Assessment Pain Assessment: PAINAD Breathing: normal Negative Vocalization: occasional moan/groan, low speech, negative/disapproving quality Facial Expression: smiling or inexpressive Body Language: tense, distressed pacing, fidgeting Consolability: distracted or reassured by voice/touch PAINAD Score: 3 Pain Location: R hip Pain Descriptors / Indicators: Aching, Discomfort Pain Intervention(s): Limited activity within patient's tolerance, Monitored during session, Premedicated before session, Repositioned     PT  Goals (current goals can now be found in the care plan section) Acute Rehab PT Goals Patient Stated Goal: none stated Progress towards PT goals: Progressing toward goals    Frequency    7X/week      PT Plan Current plan remains appropriate    Co-evaluation     PT goals addressed during session: Mobility/safety with mobility;Balance;Proper use of DME;Strengthening/ROM        AM-PAC PT "6 Clicks" Mobility   Outcome Measure  Help needed turning from your back to your side while in a flat bed without using bedrails?: A Lot Help needed moving from lying on your back to sitting on the side of a flat bed without using bedrails?: A Lot Help needed moving to and from a bed to a chair (including a wheelchair)?: Total Help needed standing up from a chair using your arms (e.g., wheelchair or bedside chair)?: Total Help needed to walk in hospital room?: Total Help needed climbing 3-5 steps with a railing? : Total 6 Click Score: 8    End of Session Equipment Utilized During Treatment: Gait belt;Oxygen Activity Tolerance: Patient tolerated treatment well;Patient limited by pain Patient left: in chair;with call bell/phone within reach;with chair alarm set;with SCD's reapplied;with nursing/sitter in room Nurse Communication: Mobility status PT Visit Diagnosis: Unsteadiness on feet (R26.81);Difficulty in walking, not elsewhere classified (R26.2);Other abnormalities of gait and mobility (R26.89);Muscle weakness (generalized) (M62.81);History of falling (Z91.81)     Time: 0350-0938 PT Time Calculation (min) (ACUTE ONLY): 24 min  Charges:  $Therapeutic Activity: 23-37 mins                    Julaine Fusi PTA 11/05/22, 11:37 AM

## 2022-11-06 DIAGNOSIS — S72144A Nondisplaced intertrochanteric fracture of right femur, initial encounter for closed fracture: Secondary | ICD-10-CM | POA: Diagnosis not present

## 2022-11-06 LAB — GLUCOSE, CAPILLARY
Glucose-Capillary: 101 mg/dL — ABNORMAL HIGH (ref 70–99)
Glucose-Capillary: 103 mg/dL — ABNORMAL HIGH (ref 70–99)
Glucose-Capillary: 115 mg/dL — ABNORMAL HIGH (ref 70–99)

## 2022-11-06 MED ORDER — ENOXAPARIN SODIUM 40 MG/0.4ML IJ SOSY: 40.0000 mg | PREFILLED_SYRINGE | INTRAMUSCULAR | 0 refills | Status: DC

## 2022-11-06 MED ORDER — ENSURE ENLIVE PO LIQD
237.0000 mL | Freq: Three times a day (TID) | ORAL | Status: DC
  Administered 2022-11-06 – 2022-11-09 (×6): 237 mL via ORAL

## 2022-11-06 NOTE — Progress Notes (Signed)
Physical Therapy Treatment Patient Details Name: Jason Murphy MRN: [id number] DOB: [birth year] Today's Date: 11/06/2022   History of Present Illness 89 y/o male presented to ED on 11/01/22 following a fall where he sustained a R intertrochanteric fx. S/p R femur IM nail on 1/17. PMH: advanced metastatic prostate cancer, CAD, HTN, OSA    PT Comments    Participated in exercises as described below.  He allows good ROM and appears generally comfortable with mobility.  He is able to transition to EOB with mod a x 1 and good effort but does need help to get upper body up off bed and manage RLE.  He does seem to have a bit more trouble with sitting balance today and required min a x 1 at all times to prevent post and right lean.  Stood to [employer] with max a x 2 and R lean continues.  He takes a couple very poor quality steps to recliner but never does stand fully upright and relies heavily on staff support to stand.  Remained in chair with needs met.  Discussed transfer with RN and tech for return to bed. Recommended left armrest be flipped up and lateral scoot or squat pivot back to bed for pt and staff safety.  +2 assist.   Recommendations for follow up therapy are one component of a multi-disciplinary discharge planning process, led by the attending physician.  Recommendations may be updated based on patient status, additional functional criteria and insurance authorization.  Follow Up Recommendations  Skilled nursing-short term rehab (<3 hours/day)     Assistance Recommended at Discharge Frequent or constant Supervision/Assistance  Patient can return home with the following Two people to help with walking and/or transfers;Two people to help with bathing/dressing/bathroom;Assistance with cooking/housework;Direct supervision/assist for medications management;Assistance with feeding;Direct supervision/assist for financial management;Assist for transportation;Help with stairs or ramp for entrance    Equipment Recommendations       Recommendations for Other Services       Precautions / Restrictions Precautions Precautions: Fall Restrictions Weight Bearing Restrictions: No RLE Weight Bearing: Weight bearing as tolerated     Mobility  Bed Mobility Overal bed mobility: Needs Assistance Bed Mobility: Supine to Sit     Supine to sit: Mod assist, Max assist          Transfers Overall transfer level: Needs assistance Equipment used: Rolling walker (2 wheels) Transfers: Sit to/from Stand, Bed to chair/wheelchair/BSC Sit to Stand: Max assist, +2 safety/equipment, +2 physical assistance   Step pivot transfers: Max assist, +2 physical assistance, Total assist       General transfer comment: very poor quality steps and never does stand fully upright.  leans left in standing.    Ambulation/Gait Ambulation/Gait assistance: Max assist, +2 physical assistance, +2 safety/equipment Gait Distance (Feet): 2 Feet Assistive device: Rolling walker (2 wheels) Gait Pattern/deviations: Step-to pattern, Antalgic, Knees buckling Gait velocity: decreased     General Gait Details: very poor quality steps supported by staff.   Stairs             Wheelchair Mobility    Modified Rankin (Stroke Patients Only)       Balance Overall balance assessment: Needs assistance Sitting-balance support: Bilateral upper extremity supported, Feet supported Sitting balance-Leahy Scale: Poor Sitting balance - Comments: +1 hands on support for sitting balance. Postural control: Left lateral lean, Posterior lean Standing balance support: Bilateral upper extremity supported, During functional activity, Reliant on assistive device for balance Standing balance-Leahy Scale: Poor Standing balance comment: left lean  with +2 heavy assist                            Cognition Arousal/Alertness: Awake/alert Behavior During Therapy: WFL for tasks assessed/performed, Agitated Overall  Cognitive Status: Within Functional Limits for tasks assessed                                          Exercises Other Exercises Other Exercises: supine RLE AA/PROM prior to mobility    General Comments        Pertinent Vitals/Pain Pain Assessment Pain Assessment: Faces Faces Pain Scale: Hurts little more Pain Location: R hip Pain Descriptors / Indicators: Aching, Discomfort, Grimacing    Home Living                          Prior Function            PT Goals (current goals can now be found in the care plan section) Progress towards PT goals: Progressing toward goals    Frequency    7X/week      PT Plan Current plan remains appropriate    Co-evaluation              AM-PAC PT "6 Clicks" Mobility   Outcome Measure  Help needed turning from your back to your side while in a flat bed without using bedrails?: A Lot Help needed moving from lying on your back to sitting on the side of a flat bed without using bedrails?: A Lot Help needed moving to and from a bed to a chair (including a wheelchair)?: Total Help needed standing up from a chair using your arms (e.g., wheelchair or bedside chair)?: Total Help needed to walk in hospital room?: Total Help needed climbing 3-5 steps with a railing? : Total 6 Click Score: 8    End of Session Equipment Utilized During Treatment: Gait belt Activity Tolerance: Patient limited by fatigue Patient left: in chair;with call bell/phone within reach;with chair alarm set Nurse Communication: Mobility status PT Visit Diagnosis: Unsteadiness on feet (R26.81);Difficulty in walking, not elsewhere classified (R26.2);Other abnormalities of gait and mobility (R26.89);Muscle weakness (generalized) (M62.81);History of falling (Z91.81)     Time: 3299-2426 PT Time Calculation (min) (ACUTE ONLY): 19 min  Charges:  $Therapeutic Exercise: 8-22 mins                    Chesley Noon, PTA 11/06/22, 11:00 AM

## 2022-11-06 NOTE — Progress Notes (Signed)
"  [name]" Administrator, Civil Service) of this pt came to hospital today, due to HIPAA no information was provided. Per pastor " no one comes in for six hours to help him eat, and it was ridiculous we needed an order for ensures". Nurse fed pt breakfast, pt ate about 25%, PT got pt to chair for the day, nurse and tech bathed pt, and performed denture care for pt, placed pt back in bed after 1100, pt took a nap then tech attempted to feed pt lunch pt didn't want much food, only bites and sips.

## 2022-11-06 NOTE — Progress Notes (Signed)
  Subjective:  Patient reports pain as mild.  Sleepy but arouses easily and follows commands.  Objective:   VITALS:   Vitals:   11/05/22 0730 11/05/22 1725 11/05/22 2255 11/06/22 0858  BP: (!) 142/94 (!) 166/69 133/60 (!) 144/67  Pulse: (!) 58 67 71 65  Resp: '16 16 17 16  '$ Temp: (!) 95.3 F (35.2 C) 98 F (36.7 C) 97.7 F (36.5 C) 97.7 F (36.5 C)  TempSrc:      SpO2: 97% 97% 95% 95%  Weight:      Height:        PHYSICAL EXAM:  Sensation intact distally Dorsiflexion/Plantar flexion intact Incision: dressing C/D/I No cellulitis present Compartment soft  LABS  Results for orders placed or performed during the hospital encounter of 11/01/22 (from the past 24 hour(s))  Glucose, capillary     Status: Abnormal   Collection Time: 11/05/22  5:28 PM  Result Value Ref Range   Glucose-Capillary 104 (H) 70 - 99 mg/dL  Glucose, capillary     Status: Abnormal   Collection Time: 11/05/22  9:36 PM  Result Value Ref Range   Glucose-Capillary 116 (H) 70 - 99 mg/dL   Comment 1 Notify RN   Glucose, capillary     Status: Abnormal   Collection Time: 11/06/22 12:09 PM  Result Value Ref Range   Glucose-Capillary 101 (H) 70 - 99 mg/dL    No results found.  Assessment/Plan: 4 Days Post-Op   Principal Problem:   Femur fracture (HCC) Active Problems:   PROSTATE CANCER   OBSTRUCTIVE SLEEP APNEA   Essential hypertension   Paroxysmal A-fib (HCC)   Infrarenal abdominal aortic aneurysm (AAA) without rupture (HCC)   Up with therapy Discharge to SNF when medically cleared F/U Dr. Mack Guise in 2 weeks   Lovell Sheehan , MD 11/06/2022, 3:24 PM

## 2022-11-06 NOTE — Progress Notes (Signed)
PROGRESS NOTE  Jason Murphy TIW:[id number] DOB: [birth year] DOA: 11/01/2022 PCP: Pcp, No  Hospital Course/Subjective: Jason Murphy is a 89 y.o. male with medical history significant of advanced metastatic prostate cancer, CAD, hypertension, hyperlipidemia, OSA, RLS, who presents to the ED due to right hip pain. He apparently tripped on something in the hall of his ALF while ambulating with his walker and unfortunately suffered right femoral neck fracture. He was seen by orthopedic surgery and they performed Intramedullary fixation for right intertrochanteric hip fracture without complication on 2/50. He had some garbled speech and agitation overnight and so CT scan of the head was obtained.  Doing well this AM, sitting up and wants to know what he's still doing in the hospital. He denies any discomfort.  Assessment/Plan:  Principal Problem:   Femur fracture (HCC) Active Problems:   Essential hypertension   Paroxysmal A-fib (HCC)   PROSTATE CANCER   OBSTRUCTIVE SLEEP APNEA   Infrarenal abdominal aortic aneurysm (AAA) without rupture (HCC)   Assessment and Plan: * Femur fracture (HCC) Patient is presenting after a ground-level fall that was mechanical in nature with CT imaging demonstrating an intertrochanteric fracture.  Patient has evidence of diffuse sclerotic lesions consistent with metastatic prostate cancer. Status post IM nail 1/17. - Orthopedic surgery following, appreciate their management and recommendations - Pain controlled on PO meds - Postoperative PT/OT, they recommend subacute nursing facility - [facility] consultation for placement assistance, pending - Orthopedic surgery recommends Lovenox for DVT prophylaxis will continue for 30 days postop  Hospital delirium Likely exacerbated by recent surgery, postop pain, etc. Patient is moving all extremities, speaking though slightly garbled. -- Update: patchy opacities on Chest Xray, will treat empirically with  Zosyn  HCAP Patient is afebrile but marginally hypoxic (89% on room air) and has white count with CXR opacities. Will treat empirically with Zosyn to cover possible HCAP, white count is improving  Essential hypertension Patient has a history of hypertension but is not currently on any medication for this.  Blood pressure initially elevated due to pain but is now back to normal range. - No indication for antihypertensives at this time. - Pain control as noted above  Paroxysmal A-fib [facility]) Patient has a history of paroxysmal atrial fibrillation, currently in sinus rhythm.  He is not on any anticoagulation or rate controlling agents.  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  He is currently on Eligard with last dose in May 2023.  Evidence of progressive sclerotic lesions on imaging today. - Continue outpatient follow-up with urology  OBSTRUCTIVE SLEEP APNEA - Continue home CPAP  Infrarenal abdominal aortic aneurysm (AAA) without rupture (HCC) On CT imaging 1/16, there was demonstration of a 5.3 x 5.6 cm infrarenal AAA with no evidence of rupture.  Previous imaging was reviewed, with CT in 2021 that demonstrated a 5 cm AAA.  Admitting MD discussed with vascular surgery, no need for any further evaluation or intervention prior to femur repair tomorrow morning. - Recommend outpatient follow-up with vascular surgery for further monitoring  DVT Prophylaxis: Lovenox per orthopedic surgery  Code Status: DNR   Family Communication: Discussed with daughter [name] over the phone 1/18.  Disposition Plan: Anticipate rehab placement  Consultants: Orthopedic Surgery   Procedures: Intramedullary fixation for right intertrochanteric hip fracture 1/17   Antimicrobials: Anti-infectives (From admission, onward)    Start     Dose/Rate Route Frequency Ordered Stop   11/03/22 1400  piperacillin-tazobactam (ZOSYN) IVPB 3.375 g  3.375 g 12.5 mL/hr over 240 Minutes Intravenous Every 8 hours 11/03/22 1221 11/10/22 1359   11/02/22 2100  ceFAZolin (ANCEF) IVPB 2g/100 mL premix        2 g 200 mL/hr over 30 Minutes Intravenous Every 6 hours 11/02/22 1732 11/03/22 0859   11/02/22 0000  ceFAZolin (ANCEF) IVPB 2g/100 mL premix        2 g 200 mL/hr over 30 Minutes Intravenous 30 min pre-op 11/01/22 1802 11/02/22 1518       Objective: Vitals:   11/05/22 0730 11/05/22 1725 11/05/22 2255 11/06/22 0858  BP: (!) 142/94 (!) 166/69 133/60 (!) 144/67  Pulse: (!) 58 67 71 65  Resp: '16 16 17 16  '$ Temp: (!) 95.3 F (35.2 C) 98 F (36.7 C) 97.7 F (36.5 C) 97.7 F (36.5 C)  TempSrc:      SpO2: 97% 97% 95% 95%  Weight:      Height:        Intake/Output Summary (Last 24 hours) at 11/06/2022 0934 Last data filed at 11/05/2022 2316 Gross per 24 hour  Intake 490 ml  Output 700 ml  Net -210 ml    Filed Weights   11/01/22 1251  Weight: 74.7 kg   Exam: General:  Alert, oriented, calm, in no acute distress, elderly male sitting up on room air Eyes: EOMI, clear conjuctivae, white sclerea Neck: supple, no masses, trachea mildline  Cardiovascular: RRR, no murmurs or rubs, no peripheral edema  Respiratory: clear to auscultation bilaterally, no wheezes, no crackles  Abdomen: soft, nontender, nondistended, normal bowel tones heard  Skin: dry, no rashes  Musculoskeletal: no joint effusions, normal range of motion  Psychiatric: appropriate affect, normal speech  Neurologic: extraocular muscles intact, clear speech, moving all extremities with intact sensorium  Data Reviewed: CBC: Recent Labs  Lab 11/01/22 1249 11/02/22 0422 11/03/22 0359 11/04/22 0519 11/05/22 0505  WBC 5.9 7.5 13.1* 11.6* 8.2  HGB 11.6* 11.7* 12.0* 11.3* 11.3*  HCT 35.5* 35.9* 36.5* 33.4* 34.3*  MCV 93.9 94.7 94.6 91.5 94.2  PLT 239 193 221 228 657    Basic Metabolic Panel: Recent Labs  Lab 11/01/22 1249 11/02/22 0422 11/03/22 0359  11/04/22 0519 11/05/22 0505  NA 134* 134* 137 137 140  K 4.0 4.0 3.8 3.5 3.5  CL 102 102 103 106 108  CO2 26 23 18* 22 24  GLUCOSE 112* 120* 164* 146* 117*  BUN 26* 23 31* 43* 45*  CREATININE 0.96 0.83 1.17 1.03 0.94  CALCIUM 8.7* 8.3* 8.6* 8.4* 8.4*  MG  --   --  1.9  --   --   PHOS  --   --  2.6  --   --     GFR: Estimated Creatinine Clearance: 41.8 mL/min (by C-G formula based on SCr of 0.94 mg/dL). Liver Function Tests: Recent Labs  Lab 11/03/22 0359  AST 52*  ALT 16  ALKPHOS 73  BILITOT 1.3*  PROT 7.0  ALBUMIN 3.6    No results for input(s): "LIPASE", "AMYLASE" in the last 168 hours. No results for input(s): "AMMONIA" in the last 168 hours. Coagulation Profile: No results for input(s): "INR", "PROTIME" in the last 168 hours. Cardiac Enzymes: No results for input(s): "CKTOTAL", "CKMB", "CKMBINDEX", "TROPONINI" in the last 168 hours. BNP (last 3 results) No results for input(s): "PROBNP" in the last 8760 hours. HbA1C: No results for input(s): "HGBA1C" in the last 72 hours.  CBG: Recent Labs  Lab 11/04/22 1717 11/05/22 0734 11/05/22 1123 11/05/22 1728 11/05/22  2136  GLUCAP 120* 150* 137* 104* 116*    Lipid Profile: No results for input(s): "CHOL", "HDL", "LDLCALC", "TRIG", "CHOLHDL", "LDLDIRECT" in the last 72 hours. Thyroid Function Tests: No results for input(s): "TSH", "T4TOTAL", "FREET4", "T3FREE", "THYROIDAB" in the last 72 hours.  Anemia Panel: No results for input(s): "VITAMINB12", "FOLATE", "FERRITIN", "TIBC", "IRON", "RETICCTPCT" in the last 72 hours.  Urine analysis:    Component Value Date/Time   COLORURINE YELLOW (A) 02/09/2021 1256   APPEARANCEUR Cloudy (A) 10/27/2021 1338   LABSPEC 1.038 (H) 02/09/2021 1256   LABSPEC 1.015 01/02/2012 2312   PHURINE 6.0 02/09/2021 1256   GLUCOSEU Negative 10/27/2021 1338   GLUCOSEU Negative 01/02/2012 2312   HGBUR NEGATIVE 02/09/2021 1256   BILIRUBINUR Negative 10/27/2021 1338   BILIRUBINUR  Negative 01/02/2012 [place] 02/09/2021 1256   PROTEINUR Trace (A) 10/27/2021 1338   PROTEINUR NEGATIVE 02/09/2021 1256   NITRITE Positive (A) 10/27/2021 1338   NITRITE POSITIVE (A) 02/09/2021 1256   LEUKOCYTESUR 1+ (A) 10/27/2021 1338   LEUKOCYTESUR LARGE (A) 02/09/2021 1256   LEUKOCYTESUR Negative 01/02/2012 2312   Sepsis Labs: '@LABRCNTIP'$ (procalcitonin:4,lacticidven:4)  )No results found for this or any previous visit (from the past 240 hour(s)).   Studies: No results found.  Scheduled Meds:  aspirin EC  81 mg Oral QHS   docusate sodium  100 mg Oral BID   enoxaparin (LOVENOX) injection  40 mg Subcutaneous Q24H   finasteride  5 mg Oral QPM   insulin aspart  0-9 Units Subcutaneous TID AC & HS   levothyroxine  50 mcg Oral QAC breakfast   pantoprazole  40 mg Oral Daily   rOPINIRole  1 mg Oral QHS   senna  1 tablet Oral BID   simvastatin  40 mg Oral QHS   traMADol  50 mg Oral Q6H    Continuous Infusions:  methocarbamol (ROBAXIN) IV     piperacillin-tazobactam (ZOSYN)  IV 3.375 g (11/06/22 0606)     LOS: 5 days   Time spent: 35 minutes  Tanylah Schnoebelen Marry Guan, MD [facility] Pager [phone]  If 7PM-7AM, please contact night-coverage [email or web address] Password [facility] 11/06/2022, 9:34 AM

## 2022-11-06 NOTE — TOC Progression Note (Signed)
Transition of Care [facility]) - Progression Note    Patient Details  Name: Jason Murphy MRN: [id number] Date of Birth: Feb 17, 1920  [facility] CM/SW Contact  Izola Price, RN Phone Number: 11/06/2022, 2:31 PM  Clinical Narrative: 1/21: Sonda Rumble Intake again per prior CSW note and had to leave another VM for Whitney regarding confirmation of bed offer. Asked to please call prior CSW VM/Call back number on Monday to confirm. Simmie Davies RN CM       Expected Discharge Plan: Skilled Nursing Facility Barriers to Discharge: Continued Medical Work up  Expected Discharge Plan and Services       Living arrangements for the past 2 months: Single Family Home                                       Social Determinants of Health (SDOH) Interventions SDOH Screenings   Food Insecurity: No Food Insecurity (11/02/2022)  Housing: Low Risk  (11/02/2022)  Transportation Needs: No Transportation Needs (11/02/2022)  Utilities: Not At Risk (11/02/2022)  Tobacco Use: Low Risk  (11/03/2022)    Readmission Risk Interventions     No data to display

## 2022-11-07 DIAGNOSIS — Z600 Problems of adjustment to life-cycle transitions: Secondary | ICD-10-CM | POA: Diagnosis not present

## 2022-11-07 DIAGNOSIS — S72144A Nondisplaced intertrochanteric fracture of right femur, initial encounter for closed fracture: Secondary | ICD-10-CM | POA: Diagnosis not present

## 2022-11-07 LAB — BASIC METABOLIC PANEL
Anion gap: 9 (ref 5–15)
BUN: 36 mg/dL — ABNORMAL HIGH (ref 8–23)
CO2: 24 mmol/L (ref 22–32)
Calcium: 8.1 mg/dL — ABNORMAL LOW (ref 8.9–10.3)
Chloride: 105 mmol/L (ref 98–111)
Creatinine, Ser: 0.92 mg/dL (ref 0.61–1.24)
GFR, Estimated: >60 mL/min (ref >60)
Glucose, Bld: 103 mg/dL — ABNORMAL HIGH (ref 70–99)
Potassium: 3.1 mmol/L — ABNORMAL LOW (ref 3.5–5.1)
Sodium: 138 mmol/L (ref 135–145)

## 2022-11-07 LAB — CBC
HCT: 32.5 % — ABNORMAL LOW (ref 39.0–52.0)
Hemoglobin: 10.5 g/dL — ABNORMAL LOW (ref 13.0–17.0)
MCH: 30.5 pg (ref 26.0–34.0)
MCHC: 32.3 g/dL (ref 30.0–36.0)
MCV: 94.5 fL (ref 80.0–100.0)
Platelets: 297 10*3/uL (ref 150–400)
RBC: 3.44 MIL/uL — ABNORMAL LOW (ref 4.22–5.81)
RDW: 13.7 % (ref 11.5–15.5)
WBC: 6.9 10*3/uL (ref 4.0–10.5)
nRBC: 0 % (ref 0.0–0.2)

## 2022-11-07 LAB — GLUCOSE, CAPILLARY
Glucose-Capillary: 104 mg/dL — ABNORMAL HIGH (ref 70–99)
Glucose-Capillary: 105 mg/dL — ABNORMAL HIGH (ref 70–99)
Glucose-Capillary: 111 mg/dL — ABNORMAL HIGH (ref 70–99)
Glucose-Capillary: 122 mg/dL — ABNORMAL HIGH (ref 70–99)

## 2022-11-07 MED ORDER — POTASSIUM CHLORIDE CRYS ER 20 MEQ PO TBCR
40.0000 meq | EXTENDED_RELEASE_TABLET | Freq: Once | ORAL | Status: AC
  Administered 2022-11-07: 40 meq via ORAL
  Filled 2022-11-07: qty 2

## 2022-11-07 NOTE — Progress Notes (Signed)
PROGRESS NOTE  [name]:[id number] DOB: 08-Jan-1920 DOA: 11/01/2022 PCP: Pcp, No  Hospital Course/Subjective: Jason Murphy is a 89 y.o. male with medical history significant of advanced metastatic prostate cancer, CAD, hypertension, hyperlipidemia, OSA, RLS, who presents to the ED due to right hip pain. He apparently tripped on something in the hall of his ALF while ambulating with his walker and unfortunately suffered right femoral neck fracture. He was seen by orthopedic surgery and they performed Intramedullary fixation for right intertrochanteric hip fracture without complication on 4/97. He had some garbled speech and agitation overnight and so CT scan of the head was obtained.  Doing well this AM, wants to go to rehab.  Assessment/Plan:  Principal Problem:   Femur fracture (HCC) Active Problems:   Essential hypertension   Paroxysmal A-fib (HCC)   PROSTATE CANCER   OBSTRUCTIVE SLEEP APNEA   Infrarenal abdominal aortic aneurysm (AAA) without rupture (HCC)   Assessment and Plan: * Femur fracture (HCC) Patient is presenting after a ground-level fall that was mechanical in nature with CT imaging demonstrating an intertrochanteric fracture.  Patient has evidence of diffuse sclerotic lesions consistent with metastatic prostate cancer. Status post IM nail 1/17. - Orthopedic surgery following, appreciate their management and recommendations - Pain controlled on PO meds - Postoperative PT/OT, they recommend subacute nursing facility - [facility] consultation for placement assistance, pending - Orthopedic surgery recommends Lovenox for DVT prophylaxis will continue for 30 days postop  Hospital delirium Likely exacerbated by recent surgery, postop pain, etc. Patient is moving all extremities, speaking though slightly garbled. -- Update: patchy opacities on Chest Xray, will treat empirically with Zosyn  HCAP Patient is afebrile but marginally hypoxic (89% on room air) and has white  count with CXR opacities. Will treat empirically with Zosyn to cover possible HCAP, white count is improving  Essential hypertension Patient has a history of hypertension but is not currently on any medication for this.  Blood pressure initially elevated due to pain but is now back to normal range. - No indication for antihypertensives at this time. - Pain control as noted above  Paroxysmal A-fib [name]) Patient has a history of paroxysmal atrial fibrillation, currently in sinus rhythm.  He is not on any anticoagulation or rate controlling agents.  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  He is currently on Eligard with last dose in May 2023.  Evidence of progressive sclerotic lesions on imaging today. - Continue outpatient follow-up with urology  OBSTRUCTIVE SLEEP APNEA - Continue home CPAP  Infrarenal abdominal aortic aneurysm (AAA) without rupture (HCC) On CT imaging 1/16, there was demonstration of a 5.3 x 5.6 cm infrarenal AAA with no evidence of rupture.  Previous imaging was reviewed, with CT in 2021 that demonstrated a 5 cm AAA.  Admitting MD discussed with vascular surgery, no need for any further evaluation or intervention prior to femur repair tomorrow morning. - Recommend outpatient follow-up with vascular surgery for further monitoring  DVT Prophylaxis: Lovenox per orthopedic surgery  Code Status: DNR   Family Communication: Discussed with daughter [name] over the phone 1/18.  Disposition Plan: Anticipate rehab placement  Consultants: Orthopedic Surgery   Procedures: Intramedullary fixation for right intertrochanteric hip fracture 1/17   Antimicrobials: Anti-infectives (From admission, onward)    Start     Dose/Rate Route Frequency Ordered Stop   11/03/22 1400  piperacillin-tazobactam (ZOSYN) IVPB 3.375 g        3.375 g 12.5 mL/hr over  240 Minutes Intravenous Every 8 hours 11/03/22 1221 11/10/22  1359   11/02/22 2100  ceFAZolin (ANCEF) IVPB 2g/100 mL premix        2 g 200 mL/hr over 30 Minutes Intravenous Every 6 hours 11/02/22 1732 11/03/22 0859   11/02/22 0000  ceFAZolin (ANCEF) IVPB 2g/100 mL premix        2 g 200 mL/hr over 30 Minutes Intravenous 30 min pre-op 11/01/22 1802 11/02/22 1518       Objective: Vitals:   11/06/22 0858 11/06/22 1741 11/06/22 2355 11/07/22 0858  BP: (!) 144/67 (!) 149/64 (!) 140/64 (!) 134/55  Pulse: 65 (!) 59 (!) 55 (!) 52  Resp: '16 16 20 16  '$ Temp: 97.7 F (36.5 C) 98 F (36.7 C) 98.7 F (37.1 C) 98.1 F (36.7 C)  TempSrc:      SpO2: 95% 96% 97% 100%  Weight:      Height:        Intake/Output Summary (Last 24 hours) at 11/07/2022 1011 Last data filed at 11/06/2022 2303 Gross per 24 hour  Intake 390 ml  Output 800 ml  Net -410 ml    Filed Weights   11/01/22 1251  Weight: 74.7 kg   Exam: General:  Alert, oriented, calm, in no acute distress, sitting up in chair Eyes: EOMI, clear conjuctivae, white sclerea Neck: supple, no masses, trachea mildline  Cardiovascular: RRR, no murmurs or rubs, no peripheral edema  Respiratory: clear to auscultation bilaterally, no wheezes, no crackles  Abdomen: soft, nontender, nondistended, normal bowel tones heard  Skin: dry, no rashes  Musculoskeletal: no joint effusions, normal range of motion  Psychiatric: appropriate affect, normal speech  Neurologic: extraocular muscles intact, clear speech, moving all extremities with intact sensorium   Data Reviewed: CBC: Recent Labs  Lab 11/02/22 0422 11/03/22 0359 11/04/22 0519 11/05/22 0505 11/07/22 0351  WBC 7.5 13.1* 11.6* 8.2 6.9  HGB 11.7* 12.0* 11.3* 11.3* 10.5*  HCT 35.9* 36.5* 33.4* 34.3* 32.5*  MCV 94.7 94.6 91.5 94.2 94.5  PLT 193 221 228 242 161    Basic Metabolic Panel: Recent Labs  Lab 11/02/22 0422 11/03/22 0359 11/04/22 0519 11/05/22 0505 11/07/22 0351  NA 134* 137 137 140 138  K 4.0 3.8 3.5 3.5 3.1*  CL 102 103 106  108 105  CO2 23 18* '22 24 24  '$ GLUCOSE 120* 164* 146* 117* 103*  BUN 23 31* 43* 45* 36*  CREATININE 0.83 1.17 1.03 0.94 0.92  CALCIUM 8.3* 8.6* 8.4* 8.4* 8.1*  MG  --  1.9  --   --   --   PHOS  --  2.6  --   --   --     GFR: Estimated Creatinine Clearance: 42.7 mL/min (by C-G formula based on SCr of 0.92 mg/dL). Liver Function Tests: Recent Labs  Lab 11/03/22 0359  AST 52*  ALT 16  ALKPHOS 73  BILITOT 1.3*  PROT 7.0  ALBUMIN 3.6    No results for input(s): "LIPASE", "AMYLASE" in the last 168 hours. No results for input(s): "AMMONIA" in the last 168 hours. Coagulation Profile: No results for input(s): "INR", "PROTIME" in the last 168 hours. Cardiac Enzymes: No results for input(s): "CKTOTAL", "CKMB", "CKMBINDEX", "TROPONINI" in the last 168 hours. BNP (last 3 results) No results for input(s): "PROBNP" in the last 8760 hours. HbA1C: No results for input(s): "HGBA1C" in the last 72 hours.  CBG: Recent Labs  Lab 11/05/22 2136 11/06/22 1209 11/06/22 1726 11/06/22 2135 11/07/22 0811  GLUCAP 116*  101* 103* 115* 104*    Lipid Profile: No results for input(s): "CHOL", "HDL", "LDLCALC", "TRIG", "CHOLHDL", "LDLDIRECT" in the last 72 hours. Thyroid Function Tests: No results for input(s): "TSH", "T4TOTAL", "FREET4", "T3FREE", "THYROIDAB" in the last 72 hours.  Anemia Panel: No results for input(s): "VITAMINB12", "FOLATE", "FERRITIN", "TIBC", "IRON", "RETICCTPCT" in the last 72 hours.  Urine analysis:    Component Value Date/Time   COLORURINE YELLOW (A) 02/09/2021 1256   APPEARANCEUR Cloudy (A) 10/27/2021 1338   LABSPEC 1.038 (H) 02/09/2021 1256   LABSPEC 1.015 01/02/2012 2312   PHURINE 6.0 02/09/2021 1256   GLUCOSEU Negative 10/27/2021 1338   GLUCOSEU Negative 01/02/2012 2312   HGBUR NEGATIVE 02/09/2021 1256   BILIRUBINUR Negative 10/27/2021 1338   BILIRUBINUR Negative 01/02/2012 [place] 02/09/2021 1256   PROTEINUR Trace (A) 10/27/2021 1338    PROTEINUR NEGATIVE 02/09/2021 1256   NITRITE Positive (A) 10/27/2021 1338   NITRITE POSITIVE (A) 02/09/2021 1256   LEUKOCYTESUR 1+ (A) 10/27/2021 1338   LEUKOCYTESUR LARGE (A) 02/09/2021 1256   LEUKOCYTESUR Negative 01/02/2012 2312   Sepsis Labs: '@LABRCNTIP'$ (procalcitonin:4,lacticidven:4)  )No results found for this or any previous visit (from the past 240 hour(s)).   Studies: No results found.  Scheduled Meds:  aspirin EC  81 mg Oral QHS   docusate sodium  100 mg Oral BID   enoxaparin (LOVENOX) injection  40 mg Subcutaneous Q24H   feeding supplement  237 mL Oral TID BM   finasteride  5 mg Oral QPM   insulin aspart  0-9 Units Subcutaneous TID AC & HS   levothyroxine  50 mcg Oral QAC breakfast   pantoprazole  40 mg Oral Daily   potassium chloride  40 mEq Oral Once   rOPINIRole  1 mg Oral QHS   senna  1 tablet Oral BID   simvastatin  40 mg Oral QHS   traMADol  50 mg Oral Q6H    Continuous Infusions:  methocarbamol (ROBAXIN) IV     piperacillin-tazobactam (ZOSYN)  IV 3.375 g (11/07/22 0631)     LOS: 6 days   Time spent: 35 minutes  Senta Kantor Marry Guan, MD [facility] Pager [phone]  If 7PM-7AM, please contact night-coverage [email or web address] Password [facility] 11/07/2022, 10:11 AM

## 2022-11-07 NOTE — Consult Note (Addendum)
[facility] Consult   Reason for Consult: concern for SI Referring Physician:  Hollice Gong  Patient Identification: Jason Murphy MRN:  [id number] Principal Diagnosis: Problems of adjustment to life-cycle transitions Diagnosis:  Principal Problem:   Problems of adjustment to life-cycle transitions Active Problems:   PROSTATE CANCER   OBSTRUCTIVE SLEEP APNEA   Essential hypertension   Paroxysmal A-fib ([place])   Femur fracture ([place])   Infrarenal abdominal aortic aneurysm (AAA) without rupture ([place])   Total Time spent with patient: 45 minutes  Subjective:  "If  I told anyone I wanted to kill myself I was full of shit." (Smiles with this comment).  Jason Murphy is a 89 y.o. male patient admitted with femur fracture.  HPI:  Patient seen on the medical floor as a consult due to concern that he expressed suicidal ideation to PT.  Patient was awake on approach.  He is alert, pleasant, smiling appropriately.  This Probation officer, along with accompanying NP and PA student present for the interview.  Patient states that he was very happy to see [place] and talk, saying "this is wonderful."  Patient denies that he has any history of mental health issues.  Denies taking any medication for depression.  Denies auditory or visual hallucinations.  He is talks in clear, coherent sentences.  He appears to be in extraordinary shape with sharp mental acuity for a man of his age.  He seems content telling us he is background of going to college and in the [employer], going to college at [employer] [place].  He reports that he has 2 daughters and 2 sons.  States that he loved his wife, but she would like to shop too much.  Patient often smiles and laughs appropriately during conversation.  He is alert to still and place.  He states he does not remember falling but knows that he hurt his leg.  He expresses frustration at being in the hospital.  Patient denies any thought or intent of suicide  and denies any history of such.  Denies auditory or visual hallucination.  Does not appear to be responding to internal stimuli.  Patient states that he knows he probably will not live much longer and that he just takes like "day to day."  When writer complimented him on how sharp he is at 89 years of age, patient states "I am 89 going on 89 in March.  He states he is "like a little kid" making sure to put any extra months on.  Past Psychiatric History: Denies  Risk to Self:   Risk to Others:   Prior Inpatient Therapy:   Prior Outpatient Therapy:    Past Medical History:  Past Medical History:  Diagnosis Date   Cancer ([place])    prostate   Coronary artery disease    Dizziness    chronic dizziness   Dyspnea    Fatigue    Progressive fatigue with question of sleep apnea   Hypercholesterolemia    Hypertension    Obstructive sleep apnea    Pneumonia    Restless leg syndrome     Past Surgical History:  Procedure Laterality Date   APPENDECTOMY     CARDIAC CATHETERIZATION     Ejection Fraction was 60%, stent   INTRAMEDULLARY (IM) NAIL INTERTROCHANTERIC Right 11/02/2022   Procedure: INTRAMEDULLARY (IM) NAIL INTERTROCHANTERIC;  Surgeon: Thornton Park, MD;  Location: [facility] ORS;  Service: Orthopedics;  Laterality: Right;   KNEE SURGERY     arthroscopic left knee  surgery   SHOULDER ARTHROSCOPY     Family History:  Family History  Problem Relation Age of Onset   COPD Mother    Family Psychiatric  History: Denies Social History:  Social History   Substance and Sexual Activity  Alcohol Use Yes   Comment: 1/2 glass wine daily     Social History   Substance and Sexual Activity  Drug Use No    Social History   Socioeconomic History   Marital status: Widowed    Spouse name: Not on file   Number of children: 4   Years of education: Col Grad   Highest education level: Not on file  Occupational History   Occupation: Retired  Tobacco Use   Smoking status: Never    Smokeless tobacco: Never  Vaping Use   Vaping Use: Never used  Substance and Sexual Activity   Alcohol use: Yes    Comment: 1/2 glass wine daily   Drug use: No   Sexual activity: Not on file  Other Topics Concern   Not on file  Social History Narrative   Caffeine Occ cup of coffee.   Social Determinants of Health   Financial Resource Strain: Not on file  Food Insecurity: No Food Insecurity (11/02/2022)   Hunger Vital Sign    Worried About Running Out of Food in the Last Year: Never true    Ran Out of Food in the Last Year: Never true  Transportation Needs: No Transportation Needs (11/02/2022)   PRAPARE - Hydrologist (Medical): No    Lack of Transportation (Non-Medical): No  Physical Activity: Not on file  Stress: Not on file  Social Connections: Not on file   Additional Social History:    Allergies:  No Known Allergies  Labs:  Results for orders placed or performed during the hospital encounter of 11/01/22 (from the past 48 hour(s))  Glucose, capillary     Status: Abnormal   Collection Time: 11/05/22  9:36 PM  Result Value Ref Range   Glucose-Capillary 116 (H) 70 - 99 mg/dL    Comment: Glucose reference range applies only to samples taken after fasting for at least 8 hours.   Comment 1 Notify RN   Glucose, capillary     Status: Abnormal   Collection Time: 11/06/22 12:09 PM  Result Value Ref Range   Glucose-Capillary 101 (H) 70 - 99 mg/dL    Comment: Glucose reference range applies only to samples taken after fasting for at least 8 hours.  Glucose, capillary     Status: Abnormal   Collection Time: 11/06/22  5:26 PM  Result Value Ref Range   Glucose-Capillary 103 (H) 70 - 99 mg/dL    Comment: Glucose reference range applies only to samples taken after fasting for at least 8 hours.  Glucose, capillary     Status: Abnormal   Collection Time: 11/06/22  9:35 PM  Result Value Ref Range   Glucose-Capillary 115 (H) 70 - 99 mg/dL    Comment:  Glucose reference range applies only to samples taken after fasting for at least 8 hours.   Comment 1 Notify RN   CBC     Status: Abnormal   Collection Time: 11/07/22  3:51 AM  Result Value Ref Range   WBC 6.9 4.0 - 10.5 K/uL   RBC 3.44 (L) 4.22 - 5.81 MIL/uL   Hemoglobin 10.5 (L) 13.0 - 17.0 g/dL   HCT 32.5 (L) 39.0 - 52.0 %   MCV 94.5 80.0 -  100.0 fL   MCH 30.5 26.0 - 34.0 pg   MCHC 32.3 30.0 - 36.0 g/dL   RDW 13.7 11.5 - 15.5 %   Platelets 297 150 - 400 K/uL   nRBC 0.0 0.0 - 0.2 %    Comment: Performed at [facility], [address] [place], [place] [address]  Basic metabolic panel     Status: Abnormal   Collection Time: 11/07/22  3:51 AM  Result Value Ref Range   Sodium 138 135 - 145 mmol/L   Potassium 3.1 (L) 3.5 - 5.1 mmol/L   Chloride 105 98 - 111 mmol/L   CO2 24 22 - 32 mmol/L   Glucose, Bld 103 (H) 70 - 99 mg/dL    Comment: Glucose reference range applies only to samples taken after fasting for at least 8 hours.   BUN 36 (H) 8 - 23 mg/dL   Creatinine, Ser 0.92 0.61 - 1.24 mg/dL   Calcium 8.1 (L) 8.9 - 10.3 mg/dL   GFR, Estimated >60 >60 mL/min    Comment: (NOTE) Calculated using the CKD-EPI Creatinine Equation (2021)    Anion gap 9 5 - 15    Comment: Performed at [facility], [address] [place], [place] [address]  Glucose, capillary     Status: Abnormal   Collection Time: 11/07/22  8:11 AM  Result Value Ref Range   Glucose-Capillary 104 (H) 70 - 99 mg/dL    Comment: Glucose reference range applies only to samples taken after fasting for at least 8 hours.  Glucose, capillary     Status: Abnormal   Collection Time: 11/07/22 12:04 PM  Result Value Ref Range   Glucose-Capillary 105 (H) 70 - 99 mg/dL    Comment: Glucose reference range applies only to samples taken after fasting for at least 8 hours.  Glucose, capillary     Status: Abnormal   Collection Time: 11/07/22  5:29 PM  Result Value Ref Range   Glucose-Capillary 111 (H) 70 - 99  mg/dL    Comment: Glucose reference range applies only to samples taken after fasting for at least 8 hours.    Current Facility-Administered Medications  Medication Dose Route Frequency Provider Last Rate Last Admin   acetaminophen (TYLENOL) tablet 325-650 mg  325-650 mg Oral Q6H PRN Thornton Park, MD   650 mg at 11/06/22 0605   alum & mag hydroxide-simeth (MAALOX/MYLANTA) 200-200-20 MG/5ML suspension 30 mL  30 mL Oral Q4H PRN Thornton Park, MD       aspirin EC tablet 81 mg  81 mg Oral QHS Thornton Park, MD   81 mg at 11/06/22 2300   bisacodyl (DULCOLAX) suppository 10 mg  10 mg Rectal Daily PRN Thornton Park, MD       docusate sodium (COLACE) capsule 100 mg  100 mg Oral BID Thornton Park, MD   100 mg at 11/07/22 1005   enoxaparin (LOVENOX) injection 40 mg  40 mg Subcutaneous Q24H Thornton Park, MD   40 mg at 11/07/22 1004   feeding supplement (ENSURE ENLIVE / ENSURE PLUS) liquid 237 mL  237 mL Oral TID BM Hollice Gong, Mir Mohammed, MD   237 mL at 11/07/22 1358   finasteride (PROSCAR) tablet 5 mg  5 mg Oral QPM Thornton Park, MD   5 mg at 11/07/22 1730   HYDROcodone-acetaminophen (NORCO/VICODIN) 5-325 MG per tablet 1-2 tablet  1-2 tablet Oral Q4H PRN Thornton Park, MD   1 tablet at 11/07/22 1005   insulin aspart (novoLOG) injection 0-9 Units  0-9 Units Subcutaneous TID AC & HS Sharion Settler, NP   1 Units at 11/05/22 1219   levothyroxine (SYNTHROID) tablet 50 mcg  50 mcg Oral QAC breakfast Thornton Park, MD   50 mcg at 11/07/22 0631   menthol-cetylpyridinium (CEPACOL) lozenge 3 mg  1 lozenge Oral PRN Thornton Park, MD       Or   phenol (CHLORASEPTIC) mouth spray 1 spray  1 spray Mouth/Throat PRN Thornton Park, MD       methocarbamol (ROBAXIN) 500 mg in dextrose 5 % 50 mL IVPB  500 mg Intravenous Q6H PRN Thornton Park, MD       morphine (PF) 2 MG/ML injection 0.5-1 mg  0.5-1 mg Intravenous Q2H PRN Thornton Park, MD       ondansetron [facility]) tablet 4 mg  4  mg Oral Q6H PRN Thornton Park, MD       Or   ondansetron [facility]) injection 4 mg  4 mg Intravenous Q6H PRN Thornton Park, MD       pantoprazole (PROTONIX) EC tablet 40 mg  40 mg Oral Daily Thornton Park, MD   40 mg at 11/07/22 1005   piperacillin-tazobactam (ZOSYN) IVPB 3.375 g  3.375 g Intravenous Q8H Hollice Gong, Mir Mohammed, MD 12.5 mL/hr at 11/07/22 1417 3.375 g at 11/07/22 1417   polyethylene glycol (MIRALAX / GLYCOLAX) packet 17 g  17 g Oral Daily PRN Thornton Park, MD       rOPINIRole (REQUIP) tablet 1 mg  1 mg Oral QHS Thornton Park, MD   1 mg at 11/06/22 2259   senna (SENOKOT) tablet 8.6 mg  1 tablet Oral BID Thornton Park, MD   8.6 mg at 11/07/22 1005   simvastatin (ZOCOR) tablet 40 mg  40 mg Oral QHS Thornton Park, MD   40 mg at 11/06/22 2300   traMADol (ULTRAM) tablet 50 mg  50 mg Oral Q6H Thornton Park, MD   50 mg at 11/07/22 1730    Musculoskeletal: Strength & Muscle Tone: decreased Gait & Station:  Did not observe Patient leans: N/A    Psychiatric Specialty Exam:  Presentation  General Appearance: Appropriate for Environment  Eye Contact:Good  Speech:Clear and Coherent  Speech Volume:Normal  Handedness:Right   Mood and Affect  Mood:Euthymic  Affect:Appropriate   Thought Process  Thought Processes:Coherent  Descriptions of Associations:Intact  Orientation:Full (Time, Place and Person)  Thought Content:Logical; WDL  History of Schizophrenia/Schizoaffective disorder:No data recorded Duration of Psychotic Symptoms:No data recorded Hallucinations:Hallucinations: None  Ideas of Reference:None  Suicidal Thoughts:Suicidal Thoughts: No  Homicidal Thoughts:Homicidal Thoughts: No   Sensorium  Memory:Immediate Good; Recent Fair; Remote Good  Judgment:Good  Insight:Fair   Executive Functions  Concentration:Fair  Attention Span:Good  [place] of Knowledge:Good  Language:Good   Psychomotor Activity   Psychomotor Activity:Psychomotor Activity: Normal   Assets  Assets:Financial Resources/Insurance; Armed forces logistics/support/administrative officer; Desire for Improvement; Resilience; Social Support; Housing   Sleep  Sleep:Sleep: Fair   Physical Exam: Physical Exam Vitals and nursing note reviewed.  HENT:     Head: Normocephalic.     Nose: No congestion or rhinorrhea.  Eyes:     General:        Right eye: No discharge.        Left eye: No discharge.  Cardiovascular:     Rate and Rhythm: Normal rate.  Pulmonary:     Effort: Pulmonary effort is normal.  Musculoskeletal:        General: Signs of injury present. Normal range of motion.  Comments: Femur fracture  Skin:    General: Skin is dry.  Neurological:     Mental Status: He is alert and oriented to person, place, and time.  Psychiatric:        Attention and Perception: Attention normal.        Mood and Affect: Mood normal.        Speech: Speech normal.        Behavior: Behavior normal.        Thought Content: Thought content normal. Thought content is not paranoid or delusional. Thought content does not include homicidal or suicidal ideation.        Cognition and Memory: Cognition normal.        Judgment: Judgment normal.     Comments: Loses train of thought periodically during conversation    Review of Systems  HENT:  Positive for hearing loss (minimal.).   Respiratory: Negative.    Musculoskeletal:  Positive for falls.  Skin:        Thin epidermis. Bruising of upper extremities  Psychiatric/Behavioral:  Positive for memory loss (appropriate for age). Negative for depression, hallucinations, substance abuse and suicidal ideas. The patient is not nervous/anxious and does not have insomnia.    Blood pressure (!) 160/77, pulse 62, temperature (!) 97.5 F (36.4 C), temperature source Oral, resp. rate 16, height '5\' 9"'$  (1.753 m), weight 74.7 kg, SpO2 98 %. Body mass index is 24.32 kg/m.  Treatment Plan Summary: Plan patient does not meet  criteria for inpatient psychiatric hospitalization.  No indication that patient is suicidal.  He denies that he has any thought or intent of harming himself.  It appears that he is appropriately frustrated about being in the hospital.  No indication for antidepressant at this point.Patient enjoys and would benefit from social interactions.  Reviewed with Dr.Ikramullah.  Disposition: No evidence of imminent risk to self or others at present.   Patient does not meet criteria for psychiatric inpatient admission. Supportive therapy provided about ongoing stressors.  Sherlon Handing, NP 11/07/2022 6:16 PM

## 2022-11-07 NOTE — TOC Progression Note (Addendum)
Transition of Care [facility]) - Progression Note    Patient Details  Name: Jason Murphy MRN: [id number] Date of Birth: 16-Feb-1920  Transition of Care [facility]) CM/SW [place], LCSW Phone Number: 11/07/2022, 12:56 PM  Clinical Narrative:    CSW called central intake and spoke with Whitney.  She stated that they don't have a male bed available and she isn't sure when one will become available.Patient's dtr, Collie Siad, informed.  Will continue to follow.  1:04  Called Leslie @ [facility] to ask her to take a look at pt.  He's in pending status with them.  Also called Tammy @ [facility] to take a look.  In pending status.  LVM.  2:42 spoke with Collie Siad, pts' dtr, to inform her that the 1 facility that offered a bed now states that they have no male beds available.  CSW explained that there are 2 pending offers and they've been asked to look at patient.  CSW educated Collie Siad on how Medicare days pay and advised her to go to the Medicare.gov website for information that explains, in detail, about Medicare.  CSW will continue to follow for discharge planning needs.   Expected Discharge Plan: [place] Barriers to Discharge: Continued Medical Work up  Expected Discharge Plan and Services       Living arrangements for the past 2 months: Single Family Home                                       Social Determinants of Health (SDOH) Interventions SDOH Screenings   Food Insecurity: No Food Insecurity (11/02/2022)  Housing: Low Risk  (11/02/2022)  Transportation Needs: No Transportation Needs (11/02/2022)  Utilities: Not At Risk (11/02/2022)  Tobacco Use: Low Risk  (11/03/2022)    Readmission Risk Interventions     No data to display

## 2022-11-07 NOTE — Progress Notes (Signed)
Physical Therapy Treatment Patient Details Name: Jason Murphy MRN: [id number] DOB: 1920/04/28 Today's Date: 11/07/2022   History of Present Illness 89 y/o male presented to ED on 11/01/22 following a fall where he sustained a R intertrochanteric fx. S/p R femur IM nail on 1/17. PMH: advanced metastatic prostate cancer, CAD, HTN, OSA    PT Comments    Pt in bed.  Initially declined OOB and transfer to chair.  Stated it was too hard yesterday to get back to bed.  After education, he does agree to ex and mobility.  Participated in exercises as described below.  He transitions to EOB with mod a x 1 and is able to sit unsupported today for about 20 minutes with ease.  He is able to stand x 2 with mod a x 2 and take a few sidesteps along EOB with heavy assist and cues for sequencing and heavy right lean.  Extended time sitting EOB since pt remains resistant to getting up to chair. Overall improved activity tolerance and sitting balance today.    Of note, pt does state he wishes was no longer in the world.  He stated he had a plan but was not able to follow through with it in time.  He does laugh and engage in session.  Relayed to RN and MD to follow up as appropriate.      Recommendations for follow up therapy are one component of a multi-disciplinary discharge planning process, led by the attending physician.  Recommendations may be updated based on patient status, additional functional criteria and insurance authorization.  Follow Up Recommendations  Skilled nursing-short term rehab (<3 hours/day)     Assistance Recommended at Discharge Frequent or constant Supervision/Assistance  Patient can return home with the following Two people to help with walking and/or transfers;Two people to help with bathing/dressing/bathroom;Assistance with cooking/housework;Direct supervision/assist for medications management;Assistance with feeding;Direct supervision/assist for financial management;Assist for  transportation;Help with stairs or ramp for entrance   Equipment Recommendations  Hospital bed;Wheelchair cushion (measurements PT);Wheelchair (measurements PT);Rolling walker (2 wheels);BSC/3in1    Recommendations for Other Services       Precautions / Restrictions Precautions Precautions: Fall Restrictions Weight Bearing Restrictions: No RLE Weight Bearing: Weight bearing as tolerated     Mobility  Bed Mobility Overal bed mobility: Needs Assistance Bed Mobility: Supine to Sit, Sit to Supine     Supine to sit: Mod assist Sit to supine: Mod assist, +2 for physical assistance        Transfers Overall transfer level: Needs assistance Equipment used: Rolling walker (2 wheels) Transfers: Sit to/from Stand Sit to Stand: Mod assist, +2 physical assistance                Ambulation/Gait Ambulation/Gait assistance: Max assist, +2 physical assistance, +2 safety/equipment Gait Distance (Feet): 2 Feet Assistive device: Rolling walker (2 wheels) Gait Pattern/deviations: Step-to pattern, Antalgic Gait velocity: decreased     General Gait Details: side steps along EOB with heavy assist and verbal and tactile cues.   Stairs             Wheelchair Mobility    Modified Rankin (Stroke Patients Only)       Balance Overall balance assessment: Needs assistance Sitting-balance support: Bilateral upper extremity supported, Feet supported Sitting balance-Leahy Scale: Fair Sitting balance - Comments: able to sit unsupported today   Standing balance support: Bilateral upper extremity supported, During functional activity, Reliant on assistive device for balance Standing balance-Leahy Scale: Poor Standing balance comment: left lean  with +2 heavy assist but somewhat improved over yesterday                            Cognition Arousal/Alertness: Awake/alert Behavior During Therapy: WFL for tasks assessed/performed, Agitated Overall Cognitive Status:  Within Functional Limits for tasks assessed                                          Exercises Other Exercises Other Exercises: supine RLE AA/PROM prior to mobility    General Comments        Pertinent Vitals/Pain Pain Assessment Pain Assessment: Faces Faces Pain Scale: Hurts little more Pain Location: R hip Pain Descriptors / Indicators: Aching, Discomfort, Grimacing Pain Intervention(s): Limited activity within patient's tolerance, Monitored during session, Repositioned    Home Living                          Prior Function            PT Goals (current goals can now be found in the care plan section) Progress towards PT goals: Progressing toward goals    Frequency    7X/week      PT Plan Current plan remains appropriate    Co-evaluation              AM-PAC PT "6 Clicks" Mobility   Outcome Measure  Help needed turning from your back to your side while in a flat bed without using bedrails?: A Lot Help needed moving from lying on your back to sitting on the side of a flat bed without using bedrails?: A Lot Help needed moving to and from a bed to a chair (including a wheelchair)?: Total Help needed standing up from a chair using your arms (e.g., wheelchair or bedside chair)?: A Lot Help needed to walk in hospital room?: Total Help needed climbing 3-5 steps with a railing? : Total 6 Click Score: 9    End of Session Equipment Utilized During Treatment: Gait belt Activity Tolerance: Patient tolerated treatment well Patient left: with call bell/phone within reach;in bed;with bed alarm set Nurse Communication: Mobility status PT Visit Diagnosis: Unsteadiness on feet (R26.81);Difficulty in walking, not elsewhere classified (R26.2);Other abnormalities of gait and mobility (R26.89);Muscle weakness (generalized) (M62.81);History of falling (Z91.81)     Time: 9826-4158 PT Time Calculation (min) (ACUTE ONLY): 31 min  Charges:   $Therapeutic Exercise: 8-22 mins $Therapeutic Activity: 8-22 mins                   Chesley Noon, PTA 11/07/22, 12:46 PM

## 2022-11-07 NOTE — Progress Notes (Signed)
Subjective:  POD #5 s/p intramedullary fixation for right intertrochanteric hip fracture.  Patient was sleeping but easily arousable.  He denies any significant right hip pain.  He was confused asking "what is going on here?'  Explained to him that he was in the hospital recovering from hip fracture surgery.   Objective:   VITALS:   Vitals:   11/06/22 0858 11/06/22 1741 11/06/22 2355 11/07/22 0858  BP: (!) 144/67 (!) 149/64 (!) 140/64 (!) 134/55  Pulse: 65 (!) 59 (!) 55 (!) 52  Resp: '16 16 20 16  '$ Temp: 97.7 F (36.5 C) 98 F (36.7 C) 98.7 F (37.1 C) 98.1 F (36.7 C)  TempSrc:      SpO2: 95% 96% 97% 100%  Weight:      Height:        PHYSICAL EXAM: Right lower extremity: Patient has ecchymosis over the lateral right hip.  Compartments are soft and compressible.  There is no erythema or drainage. Neurovascular intact Sensation intact distally Intact pulses distally Dorsiflexion/Plantar flexion intact Incision: dressing C/D/I No cellulitis present Compartment soft  LABS  Results for orders placed or performed during the hospital encounter of 11/01/22 (from the past 24 hour(s))  Glucose, capillary     Status: Abnormal   Collection Time: 11/06/22  5:26 PM  Result Value Ref Range   Glucose-Capillary 103 (H) 70 - 99 mg/dL  Glucose, capillary     Status: Abnormal   Collection Time: 11/06/22  9:35 PM  Result Value Ref Range   Glucose-Capillary 115 (H) 70 - 99 mg/dL   Comment 1 Notify RN   CBC     Status: Abnormal   Collection Time: 11/07/22  3:51 AM  Result Value Ref Range   WBC 6.9 4.0 - 10.5 K/uL   RBC 3.44 (L) 4.22 - 5.81 MIL/uL   Hemoglobin 10.5 (L) 13.0 - 17.0 g/dL   HCT 32.5 (L) 39.0 - 52.0 %   MCV 94.5 80.0 - 100.0 fL   MCH 30.5 26.0 - 34.0 pg   MCHC 32.3 30.0 - 36.0 g/dL   RDW 13.7 11.5 - 15.5 %   Platelets 297 150 - 400 K/uL   nRBC 0.0 0.0 - 0.2 %  Basic metabolic panel     Status: Abnormal   Collection Time: 11/07/22  3:51 AM  Result Value Ref Range    Sodium 138 135 - 145 mmol/L   Potassium 3.1 (L) 3.5 - 5.1 mmol/L   Chloride 105 98 - 111 mmol/L   CO2 24 22 - 32 mmol/L   Glucose, Bld 103 (H) 70 - 99 mg/dL   BUN 36 (H) 8 - 23 mg/dL   Creatinine, Ser 0.92 0.61 - 1.24 mg/dL   Calcium 8.1 (L) 8.9 - 10.3 mg/dL   GFR, Estimated >60 >60 mL/min   Anion gap 9 5 - 15  Glucose, capillary     Status: Abnormal   Collection Time: 11/07/22  8:11 AM  Result Value Ref Range   Glucose-Capillary 104 (H) 70 - 99 mg/dL  Glucose, capillary     Status: Abnormal   Collection Time: 11/07/22 12:04 PM  Result Value Ref Range   Glucose-Capillary 105 (H) 70 - 99 mg/dL    No results found.  Assessment/Plan: 5 Days Post-Op   Principal Problem:   Femur fracture (HCC) Active Problems:   PROSTATE CANCER   OBSTRUCTIVE SLEEP APNEA   Essential hypertension   Paroxysmal A-fib (HCC)   Infrarenal abdominal aortic aneurysm (AAA) without rupture (HCC)  Patient stable from an orthopedic standpoint.  His hemoglobin today is 10.5.  His potassium is 3.1.  Patient will continue with physical therapy as he can participate.  Patient will need a skilled nursing facility upon discharge.  Patient on Lovenox and a '81mg'$  aspirin.    Thornton Park , MD 11/07/2022, 12:14 PM

## 2022-11-07 NOTE — Progress Notes (Signed)
Occupational Therapy Treatment Patient Details Name: Jason Murphy MRN: [id number] DOB: [birth year] Today's Date: 11/07/2022   History of present illness 89 y/o male presented to ED on 11/01/22 following a fall where he sustained a R intertrochanteric fx. S/p R femur IM nail on 1/17. PMH: advanced metastatic prostate cancer, CAD, HTN, OSA   OT comments  Pt received semi-reclined in bed. Appearing irritated when OT asked how he was doing ("How do you think I'm doing just laying here?" OT validated pt's feelings, then tried to increase levity by showing him the small bowl of M&M's on his tray; pt then slowly picked up the M&M's and poured them out on top of his uneaten lunch plate in what appeared to be an act of defiance); willing to work with OT on sitting EOB for grooming, although pt with increased anxiety at EOB and then unwilling to perform grooming. T/f to EOB with MOD A and lots of step-by-step cues for reassurance. Sat EOB approx 10 minutes. See flowsheet below for further details of session. Left semi-reclined in bed with all needs in reach.  Patient will benefit from continued OT while in acute care.    Recommendations for follow up therapy are one component of a multi-disciplinary discharge planning process, led by the attending physician.  Recommendations may be updated based on patient status, additional functional criteria and insurance authorization.    Follow Up Recommendations  Skilled nursing-short term rehab (<3 hours/day)     Assistance Recommended at Discharge Frequent or constant Supervision/Assistance  Patient can return home with the following  Two people to help with walking and/or transfers;Two people to help with bathing/dressing/bathroom;Help with stairs or ramp for entrance   Equipment Recommendations  Other (comment) (defer to next venue of care)    Recommendations for Other Services      Precautions / Restrictions Precautions Precautions:  Fall Restrictions Weight Bearing Restrictions: No RLE Weight Bearing: Weight bearing as tolerated       Mobility Bed Mobility Overal bed mobility: Needs Assistance Bed Mobility: Supine to Sit, Sit to Supine     Supine to sit: Mod assist, HOB elevated Sit to supine: Mod assist   General bed mobility comments: Increased time, cues, reassurance, use of rails. Pt not appearing to have increase in pain with movement. Pt holding onto bed rail once seated to assist him with sitting balance.    Transfers Overall transfer level:  (Pt refusing sit to stand t/f today.)                       Balance Overall balance assessment: Needs assistance Sitting-balance support: Bilateral upper extremity supported, Feet supported Sitting balance-Leahy Scale: Fair Sitting balance - Comments: Pt reporting dizziness. Able to sit up for approx 10 minutes, then needing to return to supine, with increase in anxiety signs.                                   ADL either performed or assessed with clinical judgement   ADL Overall ADL's : Needs assistance/impaired   Eating/Feeding Details (indicate cue type and reason): Pt able to reach for and grasp cup with straw in it for drinking. Pt refusing any of his lunch (on tray in front of him).   Grooming Details (indicate cue type and reason): Attempted to engage pt in brushing teeth at EOB (pt initially agreeable while in semi-reclined to t/f to  EOB to brush teeth). Pt able to place toothposte on toothbrush, but becoming increasingly anxious and continuing to state that he is dizzy; insisting on returning to bed. OT assisted pt back into bed; once in semi-reclined, attempted to engage pt in brushing teeth again; pt declined.                               General ADL Comments: Pt declines ADLs this session at EOB.    Extremity/Trunk Assessment Upper Extremity Assessment Upper Extremity Assessment: Overall WFL for tasks  assessed (Pt doing well using BIL UE to pull up on rails during semi-reclined to sit t/f.)   Lower Extremity Assessment Lower Extremity Assessment: Generalized weakness;Defer to PT evaluation        Vision       Perception     Praxis      Cognition Arousal/Alertness: Awake/alert Behavior During Therapy: Agitated, Anxious Overall Cognitive Status: History of cognitive impairments - at baseline                                 General Comments: Pt appearing irritated when OT arrived; OT attempted to cheer him up by pointing out the M&M's sitting on his table in a small bowl; pt then took the M&M's and slowly poured him on the plate of his uneaten lunch tray. OT offered to set pt up for easier eating; pt declining. Did have improvement in affect when OT asked him about his WW[employer] service, but pt continued to be mildly anxious and mild-moderately irritated throughout session. Did not remember getting up to EOB with PT this morning. Pt frequently needing reassurance from OT that OT is competent to assist him with bed mobility and will return him to prior position at end of session; pt appering not to fully trust OT this session.        Exercises      Shoulder Instructions       General Comments      Pertinent Vitals/ Pain       Pain Assessment Pain Assessment: PAINAD Breathing: normal Negative Vocalization: none Facial Expression: smiling or inexpressive Body Language: relaxed Consolability: no need to console PAINAD Score: 0 Pain Location: R hip  Home Living                                          Prior Functioning/Environment              Frequency  Min 2X/week        Progress Toward Goals  OT Goals(current goals can now be found in the care plan section)  Progress towards OT goals: Progressing toward goals  Acute Rehab OT Goals Patient Stated Goal: To get better OT Goal Formulation: With patient Time For Goal  Achievement: 11/17/22 Potential to Achieve Goals: Fair ADL Goals Pt Will Perform Grooming: sitting;with set-up;with supervision Pt Will Perform Lower Body Dressing: with supervision;bed level Pt Will Transfer to Toilet: with mod assist;stand pivot transfer;bedside commode  Plan Discharge plan remains appropriate    Co-evaluation                 AM-PAC OT "6 Clicks" Daily Activity     Outcome Measure   Help from another person eating meals?: A Little  Help from another person taking care of personal grooming?: A Little Help from another person toileting, which includes using toliet, bedpan, or urinal?: Total Help from another person bathing (including washing, rinsing, drying)?: A Lot Help from another person to put on and taking off regular upper body clothing?: A Little Help from another person to put on and taking off regular lower body clothing?: Total 6 Click Score: 13    End of Session    OT Visit Diagnosis: Other abnormalities of gait and mobility (R26.89);Muscle weakness (generalized) (M62.81)   Activity Tolerance Other (comment) (limited by dizziness, anxiety)   Patient Left in bed;with bed alarm set   Nurse Communication Mobility status        Time: 6606-3016 OT Time Calculation (min): 27 min  Charges: OT General Charges $OT Visit: 1 Visit OT Treatments $Self Care/Home Management : 8-22 mins $Therapeutic Activity: 8-22 mins  Waymon Amato, MS, OTR/L   Vania Rea 11/07/2022, 3:28 PM

## 2022-11-08 ENCOUNTER — Encounter: Payer: Self-pay | Admitting: Oncology

## 2022-11-08 DIAGNOSIS — S72144A Nondisplaced intertrochanteric fracture of right femur, initial encounter for closed fracture: Secondary | ICD-10-CM | POA: Diagnosis not present

## 2022-11-08 LAB — CBC
HCT: 33.2 % — ABNORMAL LOW (ref 39.0–52.0)
Hemoglobin: 10.9 g/dL — ABNORMAL LOW (ref 13.0–17.0)
MCH: 31.1 pg (ref 26.0–34.0)
MCHC: 32.8 g/dL (ref 30.0–36.0)
MCV: 94.6 fL (ref 80.0–100.0)
Platelets: 318 10*3/uL (ref 150–400)
RBC: 3.51 MIL/uL — ABNORMAL LOW (ref 4.22–5.81)
RDW: 14 % (ref 11.5–15.5)
WBC: 7.9 10*3/uL (ref 4.0–10.5)
nRBC: 0 % (ref 0.0–0.2)

## 2022-11-08 LAB — GLUCOSE, CAPILLARY
Glucose-Capillary: 113 mg/dL — ABNORMAL HIGH (ref 70–99)
Glucose-Capillary: 120 mg/dL — ABNORMAL HIGH (ref 70–99)
Glucose-Capillary: 109 mg/dL — ABNORMAL HIGH (ref 70–99)
Glucose-Capillary: 105 mg/dL — ABNORMAL HIGH (ref 70–99)

## 2022-11-08 LAB — BASIC METABOLIC PANEL
Anion gap: 7 (ref 5–15)
BUN: 30 mg/dL — ABNORMAL HIGH (ref 8–23)
CO2: 24 mmol/L (ref 22–32)
Calcium: 8 mg/dL — ABNORMAL LOW (ref 8.9–10.3)
Chloride: 108 mmol/L (ref 98–111)
Creatinine, Ser: 0.82 mg/dL (ref 0.61–1.24)
GFR, Estimated: >60 mL/min (ref >60)
Glucose, Bld: 110 mg/dL — ABNORMAL HIGH (ref 70–99)
Potassium: 3.4 mmol/L — ABNORMAL LOW (ref 3.5–5.1)
Sodium: 139 mmol/L (ref 135–145)

## 2022-11-08 NOTE — Plan of Care (Signed)
  Problem: Clinical Measurements: Goal: Respiratory complications will improve Outcome: Progressing   Problem: Clinical Measurements: Goal: Cardiovascular complication will be avoided Outcome: Progressing   Problem: Nutrition: Goal: Adequate nutrition will be maintained Outcome: Progressing   Problem: Coping: Goal: Level of anxiety will decrease Outcome: Progressing   Problem: Health Behavior/Discharge Planning: Goal: Ability to manage health-related needs will improve Outcome: Progressing

## 2022-11-08 NOTE — Progress Notes (Signed)
Physical Therapy Treatment Patient Details Name: Jason Murphy MRN: [id number] DOB: [birth year] Today's Date: 11/08/2022   History of Present Illness 89 y/o male presented to ED on 11/01/22 following a fall where he sustained a R intertrochanteric fx. S/p R femur IM nail on 1/17. PMH: advanced metastatic prostate cancer, CAD, HTN, OSA    PT Comments    Upon arrival, pt in chair with legs off to side yelling out for help.  Repositioned pt and lowered legs to allow for seated AROM.  +2 arrived and he is transferred to bed with max +2 dependant transfer.  Pt does do better from bed due to higher height but may also be attributed to some increased confusion today. For pt and staff safety, he may benefit from a sit to stand lift from chair until strength improves.  He did seem a bit more confused today and did not know where he was, said he was paralyzed (did not recall fracture or surgery), and felt were were trying to "pull one over on him".  He did not think his family knows he is here.  Assured him that they did and pointed out things in his room that they had brought him.  Encouragement given.  Per chart review he has had similar periods of confusion during stay.  Is not combative today.  He is comfortable in bed upon leaving, needs met and safety precautions in place.   Recommendations for follow up therapy are one component of a multi-disciplinary discharge planning process, led by the attending physician.  Recommendations may be updated based on patient status, additional functional criteria and insurance authorization.  Follow Up Recommendations  Skilled nursing-short term rehab (<3 hours/day) Can patient physically be transported by private vehicle: No   Assistance Recommended at Discharge Frequent or constant Supervision/Assistance  Patient can return home with the following Two people to help with walking and/or transfers;Two people to help with bathing/dressing/bathroom;Assistance with  cooking/housework;Direct supervision/assist for medications management;Assistance with feeding;Direct supervision/assist for financial management;Assist for transportation;Help with stairs or ramp for entrance   Equipment Recommendations  Hospital bed;Wheelchair cushion (measurements PT);Wheelchair (measurements PT);Rolling walker (2 wheels);BSC/3in1    Recommendations for Other Services       Precautions / Restrictions Precautions Precautions: Fall Restrictions Weight Bearing Restrictions: No RLE Weight Bearing: Weight bearing as tolerated     Mobility  Bed Mobility Overal bed mobility: Needs Assistance Bed Mobility: Sit to Supine       Sit to supine: Max assist, +2 for physical assistance        Transfers Overall transfer level: Needs assistance Equipment used: 2 person hand held assist Transfers: Sit to/from Stand Sit to Stand: Total assist, Max assist, +2 physical assistance     Squat pivot transfers: Total assist, +2 safety/equipment     General transfer comment: poor quality with increased depenance from lower height chair    Ambulation/Gait                   Stairs             Wheelchair Mobility    Modified Rankin (Stroke Patients Only)       Balance Overall balance assessment: Needs assistance Sitting-balance support: Bilateral upper extremity supported, Feet supported Sitting balance-Leahy Scale: Fair     Standing balance support: Bilateral upper extremity supported Standing balance-Leahy Scale: Zero  Cognition Arousal/Alertness: Awake/alert Behavior During Therapy: WFL for tasks assessed/performed, Anxious Overall Cognitive Status: History of cognitive impairments - at baseline                                          Exercises Other Exercises Other Exercises: seated RLE PROM    General Comments        Pertinent Vitals/Pain Pain Assessment Pain Assessment:  Faces Faces Pain Scale: Hurts even more Pain Location: R hip - calling out due to discomfort in chair Pain Descriptors / Indicators: Aching, Discomfort, Grimacing Pain Intervention(s): Limited activity within patient's tolerance, Repositioned    Home Living                          Prior Function            PT Goals (current goals can now be found in the care plan section) Progress towards PT goals: Progressing toward goals    Frequency    7X/week      PT Plan Current plan remains appropriate    Co-evaluation              AM-PAC PT "6 Clicks" Mobility   Outcome Measure  Help needed turning from your back to your side while in a flat bed without using bedrails?: A Lot Help needed moving from lying on your back to sitting on the side of a flat bed without using bedrails?: A Lot Help needed moving to and from a bed to a chair (including a wheelchair)?: Total Help needed standing up from a chair using your arms (e.g., wheelchair or bedside chair)?: Total Help needed to walk in hospital room?: Total Help needed climbing 3-5 steps with a railing? : Total 6 Click Score: 8    End of Session Equipment Utilized During Treatment: Gait belt Activity Tolerance: Patient tolerated treatment well Patient left: with call bell/phone within reach;in bed;with bed alarm set Nurse Communication: Mobility status PT Visit Diagnosis: Unsteadiness on feet (R26.81);Difficulty in walking, not elsewhere classified (R26.2);Other abnormalities of gait and mobility (R26.89);Muscle weakness (generalized) (M62.81);History of falling (Z91.81)     Time: 1140-1153 PT Time Calculation (min) (ACUTE ONLY): 13 min  Charges:  $Therapeutic Activity: 8-22 mins                   Chesley Noon, PTA 11/08/22, 1:42 PM

## 2022-11-08 NOTE — Progress Notes (Signed)
Subjective:  POD #6 s/p IM fixation for right IT hip fracture.   Patient reports right hip pain as mild.  Patient OOB to chair.  He is awake, confused, but in no acute distress and follows commands.    Objective:   VITALS:   Vitals:   11/06/22 2355 11/07/22 0858 11/07/22 1655 11/08/22 0052  BP: (!) 140/64 (!) 134/55 (!) 160/77 (!) 144/72  Pulse: (!) 55 (!) 52 62 (!) 57  Resp: '20 16 16 18  '$ Temp: 98.7 F (37.1 C) 98.1 F (36.7 C) (!) 97.5 F (36.4 C) 97.7 F (36.5 C)  TempSrc:   Oral Oral  SpO2: 97% 100% 98% 97%  Weight:      Height:        PHYSICAL EXAM: Right lower extremity Neurovascular intact Sensation intact distally Intact pulses distally Dorsiflexion/Plantar flexion intact Incision: dressing C/D/I No cellulitis present.  + ecchymosis.  - swelling Compartment soft  LABS  Results for orders placed or performed during the hospital encounter of 11/01/22 (from the past 24 hour(s))  Glucose, capillary     Status: Abnormal   Collection Time: 11/07/22 12:04 PM  Result Value Ref Range   Glucose-Capillary 105 (H) 70 - 99 mg/dL  Glucose, capillary     Status: Abnormal   Collection Time: 11/07/22  5:29 PM  Result Value Ref Range   Glucose-Capillary 111 (H) 70 - 99 mg/dL  Glucose, capillary     Status: Abnormal   Collection Time: 11/07/22  9:07 PM  Result Value Ref Range   Glucose-Capillary 122 (H) 70 - 99 mg/dL   Comment 1 Notify RN   CBC     Status: Abnormal   Collection Time: 11/08/22  3:11 AM  Result Value Ref Range   WBC 7.9 4.0 - 10.5 K/uL   RBC 3.51 (L) 4.22 - 5.81 MIL/uL   Hemoglobin 10.9 (L) 13.0 - 17.0 g/dL   HCT 33.2 (L) 39.0 - 52.0 %   MCV 94.6 80.0 - 100.0 fL   MCH 31.1 26.0 - 34.0 pg   MCHC 32.8 30.0 - 36.0 g/dL   RDW 14.0 11.5 - 15.5 %   Platelets 318 150 - 400 K/uL   nRBC 0.0 0.0 - 0.2 %  Basic metabolic panel     Status: Abnormal   Collection Time: 11/08/22  3:11 AM  Result Value Ref Range   Sodium 139 135 - 145 mmol/L   Potassium 3.4 (L)  3.5 - 5.1 mmol/L   Chloride 108 98 - 111 mmol/L   CO2 24 22 - 32 mmol/L   Glucose, Bld 110 (H) 70 - 99 mg/dL   BUN 30 (H) 8 - 23 mg/dL   Creatinine, Ser 0.82 0.61 - 1.24 mg/dL   Calcium 8.0 (L) 8.9 - 10.3 mg/dL   GFR, Estimated >60 >60 mL/min   Anion gap 7 5 - 15  Glucose, capillary     Status: Abnormal   Collection Time: 11/08/22  8:12 AM  Result Value Ref Range   Glucose-Capillary 113 (H) 70 - 99 mg/dL    No results found.  Assessment/Plan: 6 Days Post-Op   Principal Problem:   Problems of adjustment to life-cycle transitions Active Problems:   PROSTATE CANCER   OBSTRUCTIVE SLEEP APNEA   Essential hypertension   Paroxysmal A-fib (HCC)   Femur fracture (HCC)   Infrarenal abdominal aortic aneurysm (AAA) without rupture [facility])  Patient stable from an orthopedic standpoint.  His hemoglobin today is 10.9.  His potassium is 3.4.  Patient will continue with physical therapy as he can participate/tolerate.  Patient will need a skilled nursing facility upon discharge.  Patient on Lovenox and a '81mg'$  aspirin for DVT prophylaxis. Patient may be discharged to SNF once cleared medically.  Follow up at [facility] in [place] in 10-14 days after discharge for wound check and staple removal.  Continue lovenox until follow up.    Thornton Park , MD 11/08/2022, 9:19 AM

## 2022-11-08 NOTE — Progress Notes (Signed)
PROGRESS NOTE  Jason Murphy ELF:[id number] DOB: 1920/01/04 DOA: 11/01/2022 PCP: Pcp, No  Hospital Course/Subjective: Jason Murphy is a 89 y.o. male with medical history significant of advanced metastatic prostate cancer, CAD, hypertension, hyperlipidemia, OSA, RLS, who presents to the ED due to right hip pain. He apparently tripped on something in the hall of his ALF while ambulating with his walker and unfortunately suffered right femoral neck fracture. He was seen by orthopedic surgery and they performed Intramedullary fixation for right intertrochanteric hip fracture without complication on 5/85. He had some garbled speech and agitation overnight and so CT scan of the head was obtained 1/18, but was unremarkable.  He was seen by psychiatry in 1/22 due to borderline suicidal comments he made.  They do not recommend any intervention at this time, say that he is appropriately frustrated but not suicidal.  Doing well today, sitting up in a chair at the bedside.  Has no complaints, awaiting rehab placement. Assessment/Plan:  Principal Problem:   Problems of adjustment to life-cycle transitions Active Problems:   Femur fracture (HCC)   Essential hypertension   Paroxysmal A-fib (HCC)   PROSTATE CANCER   OBSTRUCTIVE SLEEP APNEA   Infrarenal abdominal aortic aneurysm (AAA) without rupture (HCC)   Assessment and Plan: * Femur fracture (HCC) Patient is presenting after a ground-level fall that was mechanical in nature with CT imaging demonstrating an intertrochanteric fracture.  Patient has evidence of diffuse sclerotic lesions consistent with metastatic prostate cancer. Status post IM nail 1/17. - Orthopedic surgery following, appreciate their management and recommendations - Pain controlled on PO meds - Postoperative PT/OT, they recommend subacute nursing facility - [facility] consultation for placement assistance, pending - Orthopedic surgery recommends Lovenox for DVT prophylaxis will continue  for 30 days postop or at least until he follows up in [facility] delirium Likely exacerbated by recent surgery, postop pain, etc. Patient is moving all extremities, speaking though slightly garbled. -- Update: patchy opacities on Chest Xray, will treat empirically with Zosyn  HCAP Patient is afebrile but marginally hypoxic (89% on room air) and has white count with CXR opacities. Will treat empirically with Zosyn to cover possible HCAP, complete 7 days tomorrow 1/24  Essential hypertension Patient has a history of hypertension but is not currently on any medication for this.  Blood pressure initially elevated due to pain but is now back to normal range. - No indication for antihypertensives at this time. - Pain control as noted above  Paroxysmal A-fib [facility]) Patient has a history of paroxysmal atrial fibrillation, currently in sinus rhythm.  He is not on any anticoagulation or rate controlling agents.  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  He is currently on Eligard with last dose in May 2023.  Evidence of progressive sclerotic lesions on imaging today. - Continue outpatient follow-up with urology  OBSTRUCTIVE SLEEP APNEA - Continue home CPAP  Infrarenal abdominal aortic aneurysm (AAA) without rupture (HCC) On CT imaging 1/16, there was demonstration of a 5.3 x 5.6 cm infrarenal AAA with no evidence of rupture.  Previous imaging was reviewed, with CT in 2021 that demonstrated a 5 cm AAA.  Admitting MD discussed with vascular surgery, no need for any further evaluation or intervention at this time - Recommend outpatient follow-up with vascular surgery for further monitoring  DVT Prophylaxis: Lovenox per orthopedic surgery  Code Status: DNR   Family Communication: Discussed with daughter Jason Murphy over the phone 1/18.  Disposition Plan: Anticipate rehab placement  Consultants: Orthopedic Surgery    Procedures: Intramedullary fixation for right intertrochanteric hip fracture 1/17   Antimicrobials: Anti-infectives (From admission, onward)    Start     Dose/Rate Route Frequency Ordered Stop   11/03/22 1400  piperacillin-tazobactam (ZOSYN) IVPB 3.375 g        3.375 g 12.5 mL/hr over 240 Minutes Intravenous Every 8 hours 11/03/22 1221 11/10/22 1359   11/02/22 2100  ceFAZolin (ANCEF) IVPB 2g/100 mL premix        2 g 200 mL/hr over 30 Minutes Intravenous Every 6 hours 11/02/22 1732 11/03/22 0859   11/02/22 0000  ceFAZolin (ANCEF) IVPB 2g/100 mL premix        2 g 200 mL/hr over 30 Minutes Intravenous 30 min pre-op 11/01/22 1802 11/02/22 1518       Objective: Vitals:   11/06/22 2355 11/07/22 0858 11/07/22 1655 11/08/22 0052  BP: (!) 140/64 (!) 134/55 (!) 160/77 (!) 144/72  Pulse: (!) 55 (!) 52 62 (!) 57  Resp: '20 16 16 18  '$ Temp: 98.7 F (37.1 C) 98.1 F (36.7 C) (!) 97.5 F (36.4 C) 97.7 F (36.5 C)  TempSrc:   Oral Oral  SpO2: 97% 100% 98% 97%  Weight:      Height:        Intake/Output Summary (Last 24 hours) at 11/08/2022 1109 Last data filed at 11/08/2022 0710 Gross per 24 hour  Intake --  Output 1300 ml  Net -1300 ml    Filed Weights   11/01/22 1251  Weight: 74.7 kg   Exam: General:  Alert, oriented, calm, in no acute distress  Eyes: EOMI, clear conjuctivae, white sclerea Neck: supple, no masses, trachea mildline  Cardiovascular: RRR, no murmurs or rubs, no peripheral edema  Respiratory: clear to auscultation bilaterally, no wheezes, no crackles  Abdomen: soft, nontender, nondistended, normal bowel tones heard  Skin: dry, no rashes  Musculoskeletal: no joint effusions, normal range of motion  Psychiatric: appropriate affect, normal speech  Neurologic: extraocular muscles intact, clear speech, moving all extremities with intact sensorium  Data Reviewed: CBC: Recent Labs  Lab 11/03/22 0359 11/04/22 0519 11/05/22 0505 11/07/22 0351 11/08/22 0311   WBC 13.1* 11.6* 8.2 6.9 7.9  HGB 12.0* 11.3* 11.3* 10.5* 10.9*  HCT 36.5* 33.4* 34.3* 32.5* 33.2*  MCV 94.6 91.5 94.2 94.5 94.6  PLT 221 228 242 297 329    Basic Metabolic Panel: Recent Labs  Lab 11/03/22 0359 11/04/22 0519 11/05/22 0505 11/07/22 0351 11/08/22 0311  NA 137 137 140 138 139  K 3.8 3.5 3.5 3.1* 3.4*  CL 103 106 108 105 108  CO2 18* '22 24 24 24  '$ GLUCOSE 164* 146* 117* 103* 110*  BUN 31* 43* 45* 36* 30*  CREATININE 1.17 1.03 0.94 0.92 0.82  CALCIUM 8.6* 8.4* 8.4* 8.1* 8.0*  MG 1.9  --   --   --   --   PHOS 2.6  --   --   --   --     GFR: Estimated Creatinine Clearance: 47.9 mL/min (by C-G formula based on SCr of 0.82 mg/dL). Liver Function Tests: Recent Labs  Lab 11/03/22 0359  AST 52*  ALT 16  ALKPHOS 73  BILITOT 1.3*  PROT 7.0  ALBUMIN 3.6    No results for input(s): "LIPASE", "AMYLASE" in the last 168 hours. No results for input(s): "AMMONIA" in the last 168 hours. Coagulation Profile: No results for input(s): "INR", "PROTIME" in the last 168 hours. Cardiac  Enzymes: No results for input(s): "CKTOTAL", "CKMB", "CKMBINDEX", "TROPONINI" in the last 168 hours. BNP (last 3 results) No results for input(s): "PROBNP" in the last 8760 hours. HbA1C: No results for input(s): "HGBA1C" in the last 72 hours.  CBG: Recent Labs  Lab 11/07/22 0811 11/07/22 1204 11/07/22 1729 11/07/22 2107 11/08/22 0812  GLUCAP 104* 105* 111* 122* 113*    Lipid Profile: No results for input(s): "CHOL", "HDL", "LDLCALC", "TRIG", "CHOLHDL", "LDLDIRECT" in the last 72 hours. Thyroid Function Tests: No results for input(s): "TSH", "T4TOTAL", "FREET4", "T3FREE", "THYROIDAB" in the last 72 hours.  Anemia Panel: No results for input(s): "VITAMINB12", "FOLATE", "FERRITIN", "TIBC", "IRON", "RETICCTPCT" in the last 72 hours.  Urine analysis:    Component Value Date/Time   COLORURINE YELLOW (A) 02/09/2021 1256   APPEARANCEUR Cloudy (A) 10/27/2021 1338   LABSPEC 1.038  (H) 02/09/2021 1256   LABSPEC 1.015 01/02/2012 2312   PHURINE 6.0 02/09/2021 1256   GLUCOSEU Negative 10/27/2021 1338   GLUCOSEU Negative 01/02/2012 2312   HGBUR NEGATIVE 02/09/2021 1256   BILIRUBINUR Negative 10/27/2021 1338   BILIRUBINUR Negative 01/02/2012 [place] 02/09/2021 1256   PROTEINUR Trace (A) 10/27/2021 1338   PROTEINUR NEGATIVE 02/09/2021 1256   NITRITE Positive (A) 10/27/2021 1338   NITRITE POSITIVE (A) 02/09/2021 1256   LEUKOCYTESUR 1+ (A) 10/27/2021 1338   LEUKOCYTESUR LARGE (A) 02/09/2021 1256   LEUKOCYTESUR Negative 01/02/2012 2312   Sepsis Labs: '@LABRCNTIP'$ (procalcitonin:4,lacticidven:4)  )No results found for this or any previous visit (from the past 240 hour(s)).   Studies: No results found.  Scheduled Meds:  aspirin EC  81 mg Oral QHS   docusate sodium  100 mg Oral BID   enoxaparin (LOVENOX) injection  40 mg Subcutaneous Q24H   feeding supplement  237 mL Oral TID BM   finasteride  5 mg Oral QPM   insulin aspart  0-9 Units Subcutaneous TID AC & HS   levothyroxine  50 mcg Oral QAC breakfast   pantoprazole  40 mg Oral Daily   rOPINIRole  1 mg Oral QHS   senna  1 tablet Oral BID   simvastatin  40 mg Oral QHS   traMADol  50 mg Oral Q6H   Continuous Infusions:  methocarbamol (ROBAXIN) IV     piperacillin-tazobactam (ZOSYN)  IV 3.375 g (11/08/22 0607)    LOS: 7 days   Time spent: 35 minutes  Taysen Bushart Marry Guan, MD [facility] Pager [phone]  If 7PM-7AM, please contact night-coverage [email or web address] Password [facility] 11/08/2022, 11:09 AM

## 2022-11-08 NOTE — TOC Progression Note (Signed)
Transition of Care [facility]) - Progression Note    Patient Details  Name: Jason Murphy MRN: [id number] Date of Birth: 01/07/1920  Transition of Care [facility]) CM/SW [place], RN Phone Number: 11/08/2022, 9:09 AM  Clinical Narrative:   Received a message from [place] the patient's step daughter she is concerned that he will go back to [facility] without the level of care he needs, I explained that typically an ALF will not accept a patient to return if they are a higher level of care than they are licensed for and ALF is not licensed to be able to help a patient that is a 2 plus assist.  At this time the patient does not have a bed offer, [facility] has been requested to review    Expected Discharge Plan: [place] Barriers to Discharge: Continued Medical Work up  Expected Discharge Plan and Services       Living arrangements for the past 2 months: Single Family Home                                       Social Determinants of Health (SDOH) Interventions SDOH Screenings   Food Insecurity: No Food Insecurity (11/02/2022)  Housing: Low Risk  (11/02/2022)  Transportation Needs: No Transportation Needs (11/02/2022)  Utilities: Not At Risk (11/02/2022)  Tobacco Use: Low Risk  (11/03/2022)    Readmission Risk Interventions     No data to display

## 2022-11-09 DIAGNOSIS — Z600 Problems of adjustment to life-cycle transitions: Secondary | ICD-10-CM | POA: Diagnosis not present

## 2022-11-09 LAB — BASIC METABOLIC PANEL
Anion gap: 9 (ref 5–15)
BUN: 28 mg/dL — ABNORMAL HIGH (ref 8–23)
CO2: 21 mmol/L — ABNORMAL LOW (ref 22–32)
Calcium: 8.3 mg/dL — ABNORMAL LOW (ref 8.9–10.3)
Chloride: 110 mmol/L (ref 98–111)
Creatinine, Ser: 0.96 mg/dL (ref 0.61–1.24)
GFR, Estimated: >60 mL/min (ref >60)
Glucose, Bld: 121 mg/dL — ABNORMAL HIGH (ref 70–99)
Potassium: 3.2 mmol/L — ABNORMAL LOW (ref 3.5–5.1)
Sodium: 140 mmol/L (ref 135–145)

## 2022-11-09 LAB — CBC
HCT: 34.9 % — ABNORMAL LOW (ref 39.0–52.0)
Hemoglobin: 11.5 g/dL — ABNORMAL LOW (ref 13.0–17.0)
MCH: 30.9 pg (ref 26.0–34.0)
MCHC: 33 g/dL (ref 30.0–36.0)
MCV: 93.8 fL (ref 80.0–100.0)
Platelets: 357 10*3/uL (ref 150–400)
RBC: 3.72 MIL/uL — ABNORMAL LOW (ref 4.22–5.81)
RDW: 14.1 % (ref 11.5–15.5)
WBC: 8.9 10*3/uL (ref 4.0–10.5)
nRBC: 0 % (ref 0.0–0.2)

## 2022-11-09 LAB — GLUCOSE, CAPILLARY
Glucose-Capillary: 118 mg/dL — ABNORMAL HIGH (ref 70–99)
Glucose-Capillary: 119 mg/dL — ABNORMAL HIGH (ref 70–99)
Glucose-Capillary: 135 mg/dL — ABNORMAL HIGH (ref 70–99)
Glucose-Capillary: 120 mg/dL — ABNORMAL HIGH (ref 70–99)
Glucose-Capillary: 107 mg/dL — ABNORMAL HIGH (ref 70–99)

## 2022-11-09 MED ORDER — POTASSIUM CHLORIDE CRYS ER 20 MEQ PO TBCR
40.0000 meq | EXTENDED_RELEASE_TABLET | Freq: Two times a day (BID) | ORAL | Status: AC
  Administered 2022-11-09 (×2): 40 meq via ORAL
  Filled 2022-11-09 (×2): qty 2

## 2022-11-09 NOTE — Hospital Course (Addendum)
Jason Murphy is a 89 y.o. male with medical history significant of advanced metastatic prostate cancer, CAD, hypertension, hyperlipidemia, OSA, RLS, who presents to the ED due to right hip pain. He apparently tripped on something in the hall of his ALF while ambulating with his walker and unfortunately suffered right femoral neck fracture. He was seen by orthopedic surgery and they performed Intramedullary fixation for right intertrochanteric hip fracture without complication on 3/55. He had some garbled speech and agitation overnight and so CT scan of the head was obtained 1/18, but was unremarkable.  He was seen by psychiatry in 1/22 due to borderline suicidal comments he made.  They do not recommend any intervention at this time, say that he is appropriately frustrated but not suicidal. Awaiting rehab placement and has been stable. Completed 7 days abx for HCAP.   Consultants:  Orthopedic surgery  Psychiatry   Procedures: Intramedullary fixation for right intertrochanteric hip fracture without complication on 7/32      ASSESSMENT & PLAN:   Principal Problem:   Problems of adjustment to life-cycle transitions Active Problems:   Femur fracture (HCC)   Essential hypertension   Paroxysmal A-fib (HCC)   PROSTATE CANCER   OBSTRUCTIVE SLEEP APNEA   Infrarenal abdominal aortic aneurysm (AAA) without rupture (HCC)   Femur fracture (HCC) Diffuse sclerotic lesions consistent with metastatic prostate cancer.  Status post IM nail 1/17. Orthopedic surgery will follow outpatient Pain controlled on PO meds Postoperative PT/OT, they recommend subacute nursing facility [facility] consultation for placement assistance, pending Orthopedic surgery recommends Lovenox for DVT prophylaxis will continue for 30 days postop or at least until he follows up in Ortho clinic   Hypokalemia Trend down today Replace Monitor BMP and Mg  Hospital delirium Likely exacerbated by recent surgery, postop pain, etc.    HCAP Patient is afebrile but marginally hypoxic (89% on room air) and has white count with CXR opacities.  treat empirically with Zosyn to cover possible HCAP, complete 7 days on 11/09/22   Essential hypertension Patient has a history of hypertension but is not currently on any medication for this.   Blood pressure initially elevated due to pain but is now back to normal range. No indication for antihypertensives at this time. Pain control as noted above   Paroxysmal A-fib [facility]) Patient has a history of paroxysmal atrial fibrillation, currently in sinus rhythm.   He is not on any anticoagulation or rate controlling agents.   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  He is currently on Eligard with last dose in May 2023.   Evidence of progressive sclerotic lesions on imaging. Continue outpatient follow-up    OBSTRUCTIVE SLEEP APNEA Continue home CPAP   Infrarenal abdominal aortic aneurysm (AAA) without rupture (HCC) On CT imaging 1/16, there was demonstration of a 5.3 x 5.6 cm infrarenal AAA with no evidence of rupture.  Previous imaging was reviewed, with CT in 2021 that demonstrated a 5 cm AAA.  Admitting MD discussed with vascular surgery, no need for any further evaluation or intervention at this time outpatient follow-up with vascular surgery for further monitoring    DVT prophylaxis: lovenox Pertinent IV fluids/nutrition: no continuous IV fluids  Central lines / invasive devices: none  Code Status: DNR  Current Admission Status: inpatietn  TOC needs / Dispo plan: SNF Barriers to discharge / significant pending items: low BP and hypokalemia today but expect will be ok for d/c once SNF can be arranged

## 2022-11-09 NOTE — TOC Progression Note (Addendum)
Transition of Care [facility]) - Progression Note    Patient Details  Name: Jason Murphy MRN: [id number] Date of Birth: Mar 15, 1920  Transition of Care [facility]) CM/SW [place], RN Phone Number: 11/09/2022, 3:37 PM  Clinical Narrative:     Met with the patient and reviewed the bed options He said he is very [facility] with [facility] I notified [facility] at [facility], Ins will be started by [facility]  Called the Daughter [name] with the patient's permission and left a HIPAA complaint VM asking for a call back   Expected Discharge Plan: Skilled Nursing Facility Barriers to Discharge: Continued Medical Work up  Expected Discharge Plan and [place] arrangements for the past 2 months: Single Family Home                                       Social Determinants of Health (SDOH) Interventions SDOH Screenings   Food Insecurity: No Food Insecurity (11/02/2022)  Housing: Low Risk  (11/02/2022)  Transportation Needs: No Transportation Needs (11/02/2022)  Utilities: Not At Risk (11/02/2022)  Tobacco Use: Low Risk  (11/03/2022)    Readmission Risk Interventions     No data to display

## 2022-11-09 NOTE — TOC Progression Note (Signed)
Transition of Care [facility]) - Progression Note    Patient Details  Name: Jason Murphy MRN: [id number] Date of Birth: [birth year]  Transition of Care [facility]) CM/SW [place], RN Phone Number: 11/09/2022, 2:07 PM  Clinical Narrative:   Spoke with the patients daughter [name] when she called, I explained to her that the patient had a BP drop this morning, I am checking to see when he will be ready to DC, I do have some bed options for rehab and will be reviewing those with the patient to get a choice, His daughter is concerned that he will want to go to [facility] where he lives in the independent facility with [facility], I did explain that it is highly recommended that he go to [facility] SNF, I will encourage him to do so but ultimately with him being alert and oriented he will need to make the choice, we do however have to abide by his choice under [place] law. She asked me to give her a call to update her, I explained if the patient gave permission to do so I would be happy to call her, I explained that insurance will also have to approve, she stated understanding     Expected Discharge Plan: Skilled Nursing Facility Barriers to Discharge: Continued Medical Work up  Expected Discharge Plan and Services       Living arrangements for the past 2 months: Single Family Home                                       Social Determinants of Health (SDOH) Interventions SDOH Screenings   Food Insecurity: No Food Insecurity (11/02/2022)  Housing: Low Risk  (11/02/2022)  Transportation Needs: No Transportation Needs (11/02/2022)  Utilities: Not At Risk (11/02/2022)  Tobacco Use: Low Risk  (11/03/2022)    Readmission Risk Interventions     No data to display

## 2022-11-09 NOTE — Progress Notes (Signed)
Physical Therapy Treatment Patient Details Name: Jason Murphy MRN: [id number] DOB: July 04, 1920 Today's Date: 11/09/2022   History of Present Illness 89 y/o male presented to ED on 11/01/22 following a fall where he sustained a R intertrochanteric fx. S/p R femur IM nail on 1/17. PMH: advanced metastatic prostate cancer, CAD, HTN, OSA    PT Comments    Pt was long sitting in bed with untouched lunch tray in front of him. Pt is alert but had just removed his purewick. Disoriented overall with poor insight of deficits. Pt does agree to PT session and was cooperative. He required max assist of one to exit bed, stand to RW 2 x, and take 3 steps towards HOB. Pt still having RLE knee buckling and severe R lateral lean with any attempts of lifting LLE. Max assistance for L lateral wt shift throughout all standing activity. Pt is far from baseline. He will require extensive assistance to maximize independence while decreasing caregiver burden.    Recommendations for follow up therapy are one component of a multi-disciplinary discharge planning process, led by the attending physician.  Recommendations may be updated based on patient status, additional functional criteria and insurance authorization.  Follow Up Recommendations  Skilled nursing-short term rehab (<3 hours/day)     Assistance Recommended at Discharge Frequent or constant Supervision/Assistance  Patient can return home with the following A lot of help with walking and/or transfers;A lot of help with bathing/dressing/bathroom;Assistance with cooking/housework;Assistance with feeding;Direct supervision/assist for medications management;Direct supervision/assist for financial management;Assist for transportation;Help with stairs or ramp for entrance   Equipment Recommendations  Hospital bed;Wheelchair cushion (measurements PT);Wheelchair (measurements PT);Rolling walker (2 wheels);BSC/3in1       Precautions / Restrictions  Precautions Precautions: Fall Restrictions Weight Bearing Restrictions: Yes RLE Weight Bearing: Weight bearing as tolerated     Mobility  Bed Mobility Overal bed mobility: Needs Assistance Bed Mobility: Supine to Sit, Sit to Supine  Supine to sit: Mod assist, HOB elevated Sit to supine: Max assist     Transfers Overall transfer level: Needs assistance Equipment used: Rolling walker (2 wheels) Transfers: Sit to/from Stand Sit to Stand: From elevated surface, Max assist, Mod assist    General transfer comment: Pt was able to stand from elevated bed height with mod assist but required max assist from lower surface heights. Pt stood at EOB ~ 1 minute each standing trial.    Ambulation/Gait Ambulation/Gait assistance: Max assist Gait Distance (Feet): 3 Feet Assistive device: Rolling walker (2 wheels) Gait Pattern/deviations: Step-to pattern, Antalgic Gait velocity: decreased   General Gait Details: Side steps along EOB with heavy assist and verbal and tactile cues. Severe R lateral len with author assisting with lateral wt shift each step to allow opposite LE advancement.    Balance Overall balance assessment: Needs assistance Sitting-balance support: Bilateral upper extremity supported, Feet supported Sitting balance-Leahy Scale: Fair     Standing balance support: Bilateral upper extremity supported Standing balance-Leahy Scale: Poor         Cognition Arousal/Alertness: Awake/alert Behavior During Therapy: WFL for tasks assessed/performed, Anxious Overall Cognitive Status: History of cognitive impairments - at baseline      General Comments: Pt is alert but disoriented overall with poor insight of deficits or lack of progress being made               Pertinent Vitals/Pain Pain Assessment Pain Assessment: PAINAD Breathing: occasional labored breathing, short period of hyperventilation Negative Vocalization: occasional moan/groan, low speech,  negative/disapproving quality Facial Expression: smiling  or inexpressive Body Language: relaxed Consolability: no need to console PAINAD Score: 2 Pain Location: R hip in wt bearing Pain Descriptors / Indicators: Aching, Discomfort, Grimacing Pain Intervention(s): Limited activity within patient's tolerance, Monitored during session, Premedicated before session, Repositioned     PT Goals (current goals can now be found in the care plan section) Acute Rehab PT Goals Patient Stated Goal: none stated Progress towards PT goals: Progressing toward goals    Frequency    7X/week      PT Plan Current plan remains appropriate    Co-evaluation     PT goals addressed during session: Mobility/safety with mobility;Proper use of DME;Balance;Strengthening/ROM        AM-PAC PT "6 Clicks" Mobility   Outcome Measure  Help needed turning from your back to your side while in a flat bed without using bedrails?: A Lot Help needed moving from lying on your back to sitting on the side of a flat bed without using bedrails?: A Lot Help needed moving to and from a bed to a chair (including a wheelchair)?: A Lot Help needed standing up from a chair using your arms (e.g., wheelchair or bedside chair)?: A Lot Help needed to walk in hospital room?: Total Help needed climbing 3-5 steps with a railing? : Total 6 Click Score: 10    End of Session   Activity Tolerance: Patient tolerated treatment well Patient left: in bed;with call bell/phone within reach;with bed alarm set Nurse Communication: Mobility status PT Visit Diagnosis: Unsteadiness on feet (R26.81);Difficulty in walking, not elsewhere classified (R26.2);Other abnormalities of gait and mobility (R26.89);Muscle weakness (generalized) (M62.81);History of falling (Z91.81)     Time: 0233-4356 PT Time Calculation (min) (ACUTE ONLY): 23 min  Charges:  $Therapeutic Exercise: 8-22 mins $Therapeutic Activity: 8-22 mins                      Julaine Fusi PTA 11/09/22, 4:24 PM

## 2022-11-09 NOTE — Progress Notes (Signed)
Subjective:  POD #7 s/p right IT hip fracture.   Patient reports right hip pain as mild.  Patient is sitting up in bed eating dinner with the assistance of the nursing staff.  Objective:   VITALS:   Vitals:   11/08/22 0052 11/08/22 2316 11/09/22 0741 11/09/22 1609  BP: (!) 144/72 (!) 146/117 (!) 109/57 126/79  Pulse: (!) 57 75 86 (!) 59  Resp: '18 18 16 16  '$ Temp: 97.7 F (36.5 C) (!) 97.5 F (36.4 C) 97.6 F (36.4 C) 97.8 F (36.6 C)  TempSrc: Oral   Oral  SpO2: 97% 96% 99% 99%  Weight:      Height:        PHYSICAL EXAM: Right lower extremity Neurovascular intact Sensation intact distally Intact pulses distally Dorsiflexion/Plantar flexion intact Incision: dressing C/D/I No cellulitis present.  + ecchymosis.  - swelling Compartment soft  LABS  Results for orders placed or performed during the hospital encounter of 11/01/22 (from the past 24 hour(s))  Glucose, capillary     Status: Abnormal   Collection Time: 11/08/22  9:51 PM  Result Value Ref Range   Glucose-Capillary 105 (H) 70 - 99 mg/dL  CBC     Status: Abnormal   Collection Time: 11/09/22  4:39 AM  Result Value Ref Range   WBC 8.9 4.0 - 10.5 K/uL   RBC 3.72 (L) 4.22 - 5.81 MIL/uL   Hemoglobin 11.5 (L) 13.0 - 17.0 g/dL   HCT 34.9 (L) 39.0 - 52.0 %   MCV 93.8 80.0 - 100.0 fL   MCH 30.9 26.0 - 34.0 pg   MCHC 33.0 30.0 - 36.0 g/dL   RDW 14.1 11.5 - 15.5 %   Platelets 357 150 - 400 K/uL   nRBC 0.0 0.0 - 0.2 %  Basic metabolic panel     Status: Abnormal   Collection Time: 11/09/22  4:39 AM  Result Value Ref Range   Sodium 140 135 - 145 mmol/L   Potassium 3.2 (L) 3.5 - 5.1 mmol/L   Chloride 110 98 - 111 mmol/L   CO2 21 (L) 22 - 32 mmol/L   Glucose, Bld 121 (H) 70 - 99 mg/dL   BUN 28 (H) 8 - 23 mg/dL   Creatinine, Ser 0.96 0.61 - 1.24 mg/dL   Calcium 8.3 (L) 8.9 - 10.3 mg/dL   GFR, Estimated >60 >60 mL/min   Anion gap 9 5 - 15  Glucose, capillary     Status: Abnormal   Collection Time: 11/09/22  7:43  AM  Result Value Ref Range   Glucose-Capillary 119 (H) 70 - 99 mg/dL  Glucose, capillary     Status: Abnormal   Collection Time: 11/09/22  7:58 AM  Result Value Ref Range   Glucose-Capillary 118 (H) 70 - 99 mg/dL  Glucose, capillary     Status: Abnormal   Collection Time: 11/09/22 11:40 AM  Result Value Ref Range   Glucose-Capillary 135 (H) 70 - 99 mg/dL  Glucose, capillary     Status: Abnormal   Collection Time: 11/09/22  4:51 PM  Result Value Ref Range   Glucose-Capillary 120 (H) 70 - 99 mg/dL    No results found.  Assessment/Plan: 7 Days Post-Op   Principal Problem:   Problems of adjustment to life-cycle transitions Active Problems:   PROSTATE CANCER   OBSTRUCTIVE SLEEP APNEA   Essential hypertension   Paroxysmal A-fib (HCC)   Femur fracture (HCC)   Infrarenal abdominal aortic aneurysm (AAA) without rupture [facility])  Patient stable  from an orthopedic standpoint.  His hemoglobin today is 11.5.  His potassium is 3.2.  Patient will continue with physical therapy as he can participate/tolerate.  Patient will need a skilled nursing facility upon discharge.  Patient on Lovenox and a '81mg'$  aspirin for DVT prophylaxis. Patient may be discharged to SNF once cleared medically.  Follow up at [facility] in [place] in 10-14 days after discharge for wound check and staple removal.  Continue lovenox until follow up.     Thornton Park , MD 11/09/2022, 5:17 PM

## 2022-11-09 NOTE — Plan of Care (Signed)
  Problem: Clinical Measurements: Goal: Will remain free from infection Outcome: Progressing   Problem: Clinical Measurements: Goal: Respiratory complications will improve Outcome: Progressing   Problem: Activity: Goal: Risk for activity intolerance will decrease Outcome: Progressing   Problem: Nutrition: Goal: Adequate nutrition will be maintained Outcome: Progressing   Problem: Nutrition: Goal: Adequate nutrition will be maintained Outcome: Progressing   Problem: Pain Managment: Goal: General experience of comfort will improve Outcome: Progressing   Problem: Pain Managment: Goal: General experience of comfort will improve Outcome: Progressing   Problem: Pain Managment: Goal: General experience of comfort will improve Outcome: Progressing

## 2022-11-09 NOTE — TOC Progression Note (Signed)
Transition of Care [facility]) - Progression Note    Patient Details  Name: Jason Murphy MRN: [id number] Date of Birth: [birth year]  Transition of Care [facility]) CM/SW [place], RN Phone Number: 11/09/2022, 10:13 AM  Clinical Narrative:    No bed offers at this time, resent out the referral   Expected Discharge Plan: [place] Barriers to Discharge: Continued Medical Work up  Expected Discharge Plan and [place] arrangements for the past 2 months: Single Family Home                                       Social Determinants of Health (SDOH) Interventions SDOH Screenings   Food Insecurity: No Food Insecurity (11/02/2022)  Housing: Low Risk  (11/02/2022)  Transportation Needs: No Transportation Needs (11/02/2022)  Utilities: Not At Risk (11/02/2022)  Tobacco Use: Low Risk  (11/03/2022)    Readmission Risk Interventions     No data to display

## 2022-11-09 NOTE — Progress Notes (Signed)
PROGRESS NOTE    RALLY OUCH   AYT:[id number] DOB: 1920/06/25  DOA: 11/01/2022 Date of Service: 11/09/22 PCP: Merryl Hacker, No     Brief Narrative / Hospital Course:  Jason Murphy is a 89 y.o. male with medical history significant of advanced metastatic prostate cancer, CAD, hypertension, hyperlipidemia, OSA, RLS, who presents to the ED due to right hip pain. He apparently tripped on something in the hall of his ALF while ambulating with his walker and unfortunately suffered right femoral neck fracture. He was seen by orthopedic surgery and they performed Intramedullary fixation for right intertrochanteric hip fracture without complication on 3/55. He had some garbled speech and agitation overnight and so CT scan of the head was obtained 1/18, but was unremarkable.  He was seen by psychiatry in 1/22 due to borderline suicidal comments he made.  They do not recommend any intervention at this time, say that he is appropriately frustrated but not suicidal. Awaiting rehab placement and has been stable. Completed 7 days abx concern for HCAP.   Consultants:  Orthopedic surgery  Psychiatry   Procedures: Intramedullary fixation for right intertrochanteric hip fracture without complication on 7/32      ASSESSMENT & PLAN:   Principal Problem:   Problems of adjustment to life-cycle transitions Active Problems:   Femur fracture (HCC)   Essential hypertension   Paroxysmal A-fib (HCC)   PROSTATE CANCER   OBSTRUCTIVE SLEEP APNEA   Infrarenal abdominal aortic aneurysm (AAA) without rupture (HCC)   Femur fracture (HCC) Diffuse sclerotic lesions consistent with metastatic prostate cancer.  Status post IM nail 1/17. Orthopedic surgery will follow outpatient Pain controlled on PO meds Postoperative PT/OT, they recommend subacute nursing facility [facility] consultation for placement assistance, pending Orthopedic surgery recommends Lovenox for DVT prophylaxis will continue for 30 days postop or at  least until he follows up in Ortho clinic   Hypokalemia Trend down today Replace Monitor BMP and Mg  Hospital delirium Likely exacerbated by recent surgery, postop pain, etc.   HCAP Patient is afebrile but marginally hypoxic (89% on room air) and has white count with CXR opacities.  treat empirically with Zosyn to cover possible HCAP, complete 7 days on 11/09/22   Essential hypertension Patient has a history of hypertension but is not currently on any medication for this.   Blood pressure initially elevated due to pain but is now back to normal range. No indication for antihypertensives at this time. Pain control as noted above   Paroxysmal A-fib [facility]) Patient has a history of paroxysmal atrial fibrillation, currently in sinus rhythm.   He is not on any anticoagulation or rate controlling agents.   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  He is currently on Eligard with last dose in May 2023.   Evidence of progressive sclerotic lesions on imaging. Continue outpatient follow-up    OBSTRUCTIVE SLEEP APNEA Continue home CPAP   Infrarenal abdominal aortic aneurysm (AAA) without rupture (HCC) On CT imaging 1/16, there was demonstration of a 5.3 x 5.6 cm infrarenal AAA with no evidence of rupture.  Previous imaging was reviewed, with CT in 2021 that demonstrated a 5 cm AAA.  Admitting MD discussed with vascular surgery, no need for any further evaluation or intervention at this time outpatient follow-up with vascular surgery for further monitoring    DVT prophylaxis: lovenox Pertinent IV fluids/nutrition: no continuous IV fluids  Central lines / invasive devices: none  Code Status: DNR  Current Admission Status:  inpatietn  TOC needs / Dispo plan: SNF Barriers to discharge / significant pending items: low BP and hypokalemia today but expect will be ok for d/c once SNF can be arranged               Subjective / Brief ROS:  Patient reports no concerns today  Denies CP/SOB.  Pain controlled.  Denies new weakness.  Tolerating diet.  Reports no concerns w/ urination/defecation.   Family Communication: none at this time, TOC in contact w/ family re: placement     Objective Findings:  Vitals:   11/08/22 0052 11/08/22 2316 11/09/22 0741 11/09/22 1609  BP: (!) 144/72 (!) 146/117 (!) 109/57 126/79  Pulse: (!) 57 75 86 (!) 59  Resp: '18 18 16 16  '$ Temp: 97.7 F (36.5 C) (!) 97.5 F (36.4 C) 97.6 F (36.4 C) 97.8 F (36.6 C)  TempSrc: Oral   Oral  SpO2: 97% 96% 99% 99%  Weight:      Height:        Intake/Output Summary (Last 24 hours) at 11/09/2022 1617 Last data filed at 11/09/2022 1235 Gross per 24 hour  Intake 100 ml  Output 400 ml  Net -300 ml   Filed Weights   11/01/22 1251  Weight: 74.7 kg    Examination:  Physical Exam       Scheduled Medications:   aspirin EC  81 mg Oral QHS   docusate sodium  100 mg Oral BID   enoxaparin (LOVENOX) injection  40 mg Subcutaneous Q24H   feeding supplement  237 mL Oral TID BM   finasteride  5 mg Oral QPM   insulin aspart  0-9 Units Subcutaneous TID AC & HS   levothyroxine  50 mcg Oral QAC breakfast   pantoprazole  40 mg Oral Daily   potassium chloride  40 mEq Oral BID   rOPINIRole  1 mg Oral QHS   senna  1 tablet Oral BID   simvastatin  40 mg Oral QHS   traMADol  50 mg Oral Q6H    Continuous Infusions:  methocarbamol (ROBAXIN) IV     piperacillin-tazobactam (ZOSYN)  IV 3.375 g (11/09/22 1548)    PRN Medications:  acetaminophen, alum & mag hydroxide-simeth, bisacodyl, HYDROcodone-acetaminophen, menthol-cetylpyridinium **OR** phenol, methocarbamol (ROBAXIN) IV, morphine injection, ondansetron **OR** ondansetron (ZOFRAN) IV, polyethylene glycol  Antimicrobials from admission:  Anti-infectives (From admission, onward)    Start     Dose/Rate Route Frequency Ordered Stop   11/03/22 1400   piperacillin-tazobactam (ZOSYN) IVPB 3.375 g        3.375 g 12.5 mL/hr over 240 Minutes Intravenous Every 8 hours 11/03/22 1221 11/10/22 1359   11/02/22 2100  ceFAZolin (ANCEF) IVPB 2g/100 mL premix        2 g 200 mL/hr over 30 Minutes Intravenous Every 6 hours 11/02/22 1732 11/03/22 0859   11/02/22 0000  ceFAZolin (ANCEF) IVPB 2g/100 mL premix        2 g 200 mL/hr over 30 Minutes Intravenous 30 min pre-op 11/01/22 1802 11/02/22 1518           Data Reviewed:  I have personally reviewed the following...  CBC: Recent Labs  Lab 11/04/22 0519 11/05/22 0505 11/07/22 0351 11/08/22 0311 11/09/22 0439  WBC 11.6* 8.2 6.9 7.9 8.9  HGB 11.3* 11.3* 10.5* 10.9* 11.5*  HCT 33.4* 34.3* 32.5* 33.2* 34.9*  MCV 91.5 94.2 94.5 94.6 93.8  PLT 228 242 297 318 836   Basic Metabolic Panel: Recent Labs  Lab 11/03/22  3664 11/04/22 0519 11/05/22 0505 11/07/22 0351 11/08/22 0311 11/09/22 0439  NA 137 137 140 138 139 140  K 3.8 3.5 3.5 3.1* 3.4* 3.2*  CL 103 106 108 105 108 110  CO2 18* '22 24 24 24 '$ 21*  GLUCOSE 164* 146* 117* 103* 110* 121*  BUN 31* 43* 45* 36* 30* 28*  CREATININE 1.17 1.03 0.94 0.92 0.82 0.96  CALCIUM 8.6* 8.4* 8.4* 8.1* 8.0* 8.3*  MG 1.9  --   --   --   --   --   PHOS 2.6  --   --   --   --   --    GFR: Estimated Creatinine Clearance: 40.9 mL/min (by C-G formula based on SCr of 0.96 mg/dL). Liver Function Tests: Recent Labs  Lab 11/03/22 0359  AST 52*  ALT 16  ALKPHOS 73  BILITOT 1.3*  PROT 7.0  ALBUMIN 3.6   No results for input(s): "LIPASE", "AMYLASE" in the last 168 hours. No results for input(s): "AMMONIA" in the last 168 hours. Coagulation Profile: No results for input(s): "INR", "PROTIME" in the last 168 hours. Cardiac Enzymes: No results for input(s): "CKTOTAL", "CKMB", "CKMBINDEX", "TROPONINI" in the last 168 hours. BNP (last 3 results) No results for input(s): "PROBNP" in the last 8760 hours. HbA1C: No results for input(s): "HGBA1C" in the  last 72 hours. CBG: Recent Labs  Lab 11/08/22 1652 11/08/22 2151 11/09/22 0743 11/09/22 0758 11/09/22 1140  GLUCAP 109* 105* 119* 118* 135*   Lipid Profile: No results for input(s): "CHOL", "HDL", "LDLCALC", "TRIG", "CHOLHDL", "LDLDIRECT" in the last 72 hours. Thyroid Function Tests: No results for input(s): "TSH", "T4TOTAL", "FREET4", "T3FREE", "THYROIDAB" in the last 72 hours. Anemia Panel: No results for input(s): "VITAMINB12", "FOLATE", "FERRITIN", "TIBC", "IRON", "RETICCTPCT" in the last 72 hours. Most Recent Urinalysis On File:     Component Value Date/Time   COLORURINE YELLOW (A) 02/09/2021 1256   APPEARANCEUR Cloudy (A) 10/27/2021 1338   LABSPEC 1.038 (H) 02/09/2021 1256   LABSPEC 1.015 01/02/2012 2312   PHURINE 6.0 02/09/2021 1256   GLUCOSEU Negative 10/27/2021 1338   GLUCOSEU Negative 01/02/2012 2312   HGBUR NEGATIVE 02/09/2021 1256   BILIRUBINUR Negative 10/27/2021 1338   BILIRUBINUR Negative 01/02/2012 [place] 02/09/2021 1256   PROTEINUR Trace (A) 10/27/2021 1338   PROTEINUR NEGATIVE 02/09/2021 1256   NITRITE Positive (A) 10/27/2021 1338   NITRITE POSITIVE (A) 02/09/2021 1256   LEUKOCYTESUR 1+ (A) 10/27/2021 1338   LEUKOCYTESUR LARGE (A) 02/09/2021 1256   LEUKOCYTESUR Negative 01/02/2012 2312   Sepsis Labs: '@LABRCNTIP'$ (procalcitonin:4,lacticidven:4) Microbiology: No results found for this or any previous visit (from the past 240 hour(s)).    Radiology Studies last 3 days: No results found.           LOS: 8 days        Emeterio Reeve, DO [facility] 11/09/2022, 4:17 PM    Dictation software may have been used to generate the above note. Typos may occur and escape review in typed/dictated notes. Please contact Dr Sheppard Coil directly for clarity if needed.  Staff may message me via secure chat in [place]  but this may not receive an immediate response,  please page me for urgent matters!  If 7PM-7AM, please  contact night coverage [email or web address]

## 2022-11-09 NOTE — Progress Notes (Signed)
PT Cancellation Note  Patient Details Name: Jason Murphy MRN: [id number] DOB: 04/07/1920   Cancelled Treatment:     PT attempted to see pt this morning however BP while in sitting 75/50 (MAP 58). Pt was already up in recliner via Conservation officer, historic buildings. Author will return when BP elevates and in safe range to participate. He continues to require +2 assistance for transfers. Mechanical lift recommended from lower surfaces height to protect pt/staff.     Willette Pa 11/09/2022, 10:37 AM

## 2022-11-10 DIAGNOSIS — Z600 Problems of adjustment to life-cycle transitions: Secondary | ICD-10-CM | POA: Diagnosis not present

## 2022-11-10 LAB — BASIC METABOLIC PANEL
Anion gap: 7 (ref 5–15)
BUN: 31 mg/dL — ABNORMAL HIGH (ref 8–23)
CO2: 23 mmol/L (ref 22–32)
Calcium: 8.3 mg/dL — ABNORMAL LOW (ref 8.9–10.3)
Chloride: 112 mmol/L — ABNORMAL HIGH (ref 98–111)
Creatinine, Ser: 0.88 mg/dL (ref 0.61–1.24)
GFR, Estimated: >60 mL/min (ref >60)
Glucose, Bld: 103 mg/dL — ABNORMAL HIGH (ref 70–99)
Potassium: 4 mmol/L (ref 3.5–5.1)
Sodium: 142 mmol/L (ref 135–145)

## 2022-11-10 LAB — GLUCOSE, CAPILLARY
Glucose-Capillary: 116 mg/dL — ABNORMAL HIGH (ref 70–99)
Glucose-Capillary: 132 mg/dL — ABNORMAL HIGH (ref 70–99)

## 2022-11-10 LAB — CBC
HCT: 33.4 % — ABNORMAL LOW (ref 39.0–52.0)
Hemoglobin: 10.8 g/dL — ABNORMAL LOW (ref 13.0–17.0)
MCH: 31 pg (ref 26.0–34.0)
MCHC: 32.3 g/dL (ref 30.0–36.0)
MCV: 96 fL (ref 80.0–100.0)
Platelets: 328 10*3/uL (ref 150–400)
RBC: 3.48 MIL/uL — ABNORMAL LOW (ref 4.22–5.81)
RDW: 14.6 % (ref 11.5–15.5)
WBC: 8.3 10*3/uL (ref 4.0–10.5)
nRBC: 0 % (ref 0.0–0.2)

## 2022-11-10 LAB — MAGNESIUM: Magnesium: 2.1 mg/dL (ref 1.7–2.4)

## 2022-11-10 MED ORDER — ENOXAPARIN SODIUM 40 MG/0.4ML IJ SOSY: 40.0000 mg | PREFILLED_SYRINGE | INTRAMUSCULAR | 0 refills | Status: AC

## 2022-11-10 NOTE — Discharge Summary (Signed)
Physician Discharge Summary   Patient: Jason Murphy MRN: [id number]  DOB: September 25, 1920   Admit:     Date of Admission: 11/01/2022 Admitted from: home   Discharge: Date of discharge: 11/10/22 Disposition: Skilled nursing facility Condition at discharge: good  CODE STATUS: DNR     Discharge Physician: Emeterio Reeve, DO [facility]     PCP: Pcp, No  Recommendations for Outpatient Follow-up:  Follow up with PCP 2-4 weeks Follow up with orthopedics 10-14 days after discharge from hospital        Discharge Diagnoses: Principal Problem:   Problems of adjustment to life-cycle transitions Active Problems:   Femur fracture (HCC)   Essential hypertension   Paroxysmal A-fib ([place])   PROSTATE CANCER   OBSTRUCTIVE SLEEP APNEA   Infrarenal abdominal aortic aneurysm (AAA) without rupture [facility])       Hospital Course: Jason Murphy is a 89 y.o. male with medical history significant of advanced metastatic prostate cancer, CAD, hypertension, hyperlipidemia, OSA, RLS, who presents to the ED due to right hip pain. He apparently tripped on something in the hall of his ALF while ambulating with his walker and unfortunately suffered right femoral neck fracture. He was seen by orthopedic surgery and they performed Intramedullary fixation for right intertrochanteric hip fracture without complication on 8/67. He had some garbled speech and agitation overnight and so CT scan of the head was obtained 1/18, but was unremarkable.  He was seen by psychiatry in 1/22 due to borderline suicidal comments he made.  They do not recommend any intervention at this time, say that he is appropriately frustrated but not suicidal. Awaiting rehab placement and has been stable. Completed 7 days abx for HCAP.   Consultants:  Orthopedic surgery  Psychiatry   Procedures: Intramedullary fixation for right intertrochanteric hip fracture without complication on 6/72      ASSESSMENT & PLAN:    Femur fracture (HCC) Diffuse sclerotic lesions consistent with metastatic prostate cancer.  Status post IM nail 1/17 R femur. Orthopedic surgery will follow outpatient Pain controlled on PO meds Postoperative PT/OT, they recommend subacute nursing facility Orthopedic surgery recommends Lovenox for DVT prophylaxis will continue for 30 days postop or at least until he follows up in Ortho clinic in 10-14 days   Hypokalemia - resolved Monitor BMP and Mg  Hospital delirium - improved  Likely exacerbated by recent surgery, postop pain, etc.   HCAP - resolved completed 7 days abx on 11/09/22   Essential hypertension Patient has a history of hypertension but is not currently on any medication for this.   Blood pressure initially elevated due to pain but is now back to normal range. No indication for antihypertensives at this time.   Paroxysmal A-fib [facility]) Patient has a history of paroxysmal atrial fibrillation, currently in sinus rhythm.   He is not on any anticoagulation or rate controlling agents.   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  He is currently on Eligard with last dose in May 2023.   Evidence of progressive sclerotic lesions on imaging. Continue outpatient follow-up    OBSTRUCTIVE SLEEP APNEA Continue  CPAP qhs   Infrarenal abdominal aortic aneurysm (AAA) without rupture (HCC) On CT imaging 1/16, there was demonstration of a 5.3 x 5.6 cm infrarenal AAA with no evidence of rupture.  Previous imaging was reviewed, with CT in 2021 that demonstrated a 5 cm AAA.  Admitting MD discussed with vascular surgery, no need for  any further evaluation or intervention at this time outpatient follow-up with vascular surgery for further monitoring             Discharge Instructions  Allergies as of 11/10/2022   No Known Allergies      Medication List     STOP taking these medications     enzalutamide 80 MG tablet Commonly known as: XTANDI   nitroGLYCERIN 0.4 MG SL tablet Commonly known as: NITROSTAT       TAKE these medications    aspirin EC 81 MG tablet Take 81 mg by mouth at bedtime.   bisacodyl 10 MG suppository Commonly known as: DULCOLAX Place 1 suppository (10 mg total) rectally daily as needed for moderate constipation.   co-enzyme Q-10 50 MG capsule Take 100 mg by mouth every morning.   cyanocobalamin 1000 MCG tablet Take 1,000 mcg by mouth daily.   docusate sodium 100 MG capsule Commonly known as: COLACE Take 1 capsule (100 mg total) by mouth 2 (two) times daily.   enoxaparin 40 MG/0.4ML injection Commonly known as: LOVENOX Inject 0.4 mLs (40 mg total) into the skin daily for 25 days.   feeding supplement Liqd Take 237 mLs by mouth 3 (three) times daily between meals.   finasteride 5 MG tablet Commonly known as: PROSCAR Take 5 mg by mouth every evening.   levothyroxine 50 MCG tablet Commonly known as: SYNTHROID Take 50 mcg by mouth daily before breakfast.   omeprazole 20 MG capsule Commonly known as: PRILOSEC Take 20 mg by mouth daily.   polyethylene glycol 17 g packet Commonly known as: MIRALAX / GLYCOLAX Take 17 g by mouth daily as needed for mild constipation.   PRESERVISION AREDS 2 PO Take 1 tablet by mouth daily.   rOPINIRole 1 MG tablet Commonly known as: REQUIP Take 1 mg by mouth at bedtime.   simvastatin 40 MG tablet Commonly known as: ZOCOR TAKE 1 TABLET BY MOUTH EVERY NIGHT AT BEDTIME   traMADol 50 MG tablet Commonly known as: ULTRAM Take 1 tablet (50 mg total) by mouth every 6 (six) hours.   Vitamin D3 25 MCG (1000 UT) Caps Take 1,000 Units by mouth daily.         Contact information for after-discharge care     Destination     [facility] Preferred [facility] .   Service: Skilled Nursing Contact information: [address] [place] [phone]                      No Known Allergies   Subjective: Pt has no concerns today, would like to leave the hospital asap, no questions    Discharge Exam: BP 128/60 (BP Location: Left Arm)   Pulse 61   Temp 97.8 F (36.6 C) (Oral)   Resp 17   Ht '5\' 9"'$  (1.753 m)   Wt 74.7 kg   SpO2 97%   BMI 24.32 kg/m  General: Pt is alert, awake, not in acute distress Cardiovascular: RRR, S1/S2 +, no rubs, no gallops Respiratory: CTA bilaterally, no wheezing, no rhonchi Abdominal: Soft, NT, ND, bowel sounds + Extremities: no edema, no cyanosis     The results of significant diagnostics from this hospitalization (including imaging, microbiology, ancillary and laboratory) are listed below for reference.     Microbiology: No results found for this or any previous visit (from the past 240 hour(s)).   Labs: BNP (last 3 results) No results for input(s): "BNP" in the  last 8760 hours. Basic Metabolic Panel: Recent Labs  Lab 11/05/22 0505 11/07/22 0351 11/08/22 0311 11/09/22 0439 11/10/22 0335  NA 140 138 139 140 142  K 3.5 3.1* 3.4* 3.2* 4.0  CL 108 105 108 110 112*  CO2 '24 24 24 '$ 21* 23  GLUCOSE 117* 103* 110* 121* 103*  BUN 45* 36* 30* 28* 31*  CREATININE 0.94 0.92 0.82 0.96 0.88  CALCIUM 8.4* 8.1* 8.0* 8.3* 8.3*  MG  --   --   --   --  2.1   Liver Function Tests: No results for input(s): "AST", "ALT", "ALKPHOS", "BILITOT", "PROT", "ALBUMIN" in the last 168 hours. No results for input(s): "LIPASE", "AMYLASE" in the last 168 hours. No results for input(s): "AMMONIA" in the last 168 hours. CBC: Recent Labs  Lab 11/05/22 0505 11/07/22 0351 11/08/22 0311 11/09/22 0439 11/10/22 0335  WBC 8.2 6.9 7.9 8.9 8.3  HGB 11.3* 10.5* 10.9* 11.5* 10.8*  HCT 34.3* 32.5* 33.2* 34.9* 33.4*  MCV 94.2 94.5 94.6 93.8 96.0  PLT 242 297 318 357 328   Cardiac Enzymes: No results for input(s): "CKTOTAL", "CKMB", "CKMBINDEX", "TROPONINI" in the last 168 hours. BNP: Invalid input(s):  "POCBNP" CBG: Recent Labs  Lab 11/09/22 1140 11/09/22 1651 11/09/22 2310 11/10/22 0759 11/10/22 1129  GLUCAP 135* 120* 107* 116* 132*   D-Dimer No results for input(s): "DDIMER" in the last 72 hours. Hgb A1c No results for input(s): "HGBA1C" in the last 72 hours. Lipid Profile No results for input(s): "CHOL", "HDL", "LDLCALC", "TRIG", "CHOLHDL", "LDLDIRECT" in the last 72 hours. Thyroid function studies No results for input(s): "TSH", "T4TOTAL", "T3FREE", "THYROIDAB" in the last 72 hours.  Invalid input(s): "FREET3" Anemia work up No results for input(s): "VITAMINB12", "FOLATE", "FERRITIN", "TIBC", "IRON", "RETICCTPCT" in the last 72 hours. Urinalysis    Component Value Date/Time   COLORURINE YELLOW (A) 02/09/2021 1256   APPEARANCEUR Cloudy (A) 10/27/2021 1338   LABSPEC 1.038 (H) 02/09/2021 1256   LABSPEC 1.015 01/02/2012 2312   PHURINE 6.0 02/09/2021 1256   GLUCOSEU Negative 10/27/2021 1338   GLUCOSEU Negative 01/02/2012 2312   HGBUR NEGATIVE 02/09/2021 1256   BILIRUBINUR Negative 10/27/2021 1338   BILIRUBINUR Negative 01/02/2012 2312   KETONESUR NEGATIVE 02/09/2021 1256   PROTEINUR Trace (A) 10/27/2021 1338   PROTEINUR NEGATIVE 02/09/2021 1256   NITRITE Positive (A) 10/27/2021 1338   NITRITE POSITIVE (A) 02/09/2021 1256   LEUKOCYTESUR 1+ (A) 10/27/2021 1338   LEUKOCYTESUR LARGE (A) 02/09/2021 1256   LEUKOCYTESUR Negative 01/02/2012 2312   Sepsis Labs Recent Labs  Lab 11/07/22 0351 11/08/22 0311 11/09/22 0439 11/10/22 0335  WBC 6.9 7.9 8.9 8.3   Microbiology No results found for this or any previous visit (from the past 240 hour(s)). Imaging DG FEMUR, MIN 2 VIEWS RIGHT  Result Date: 11/02/2022 CLINICAL DATA:  Postop right hip fracture EXAM: RIGHT FEMUR 2 VIEWS COMPARISON:  Radiographs dated November 01, 2022 FINDINGS: Status post ORIF with intramedullary nail placement for proximal right femoral fracture subcutaneous emphysema and surgical screws as  expected. Prominent vascular calcifications. IMPRESSION: Status post ORIF with intramedullary nail placement for proximal right femoral fracture with postsurgical changes as expected. Electronically Signed   By: Keane Police D.O.   On: 11/02/2022 17:01   DG FEMUR, MIN 2 VIEWS RIGHT  Result Date: 11/02/2022 CLINICAL DATA:  Status post right femoral fixation EXAM: RIGHT FEMUR 2 VIEWS COMPARISON:  None Available. FINDINGS: Six fluoroscopic images obtained during right femoral fixation. 1 minute 40 seconds fluoro time utilized. Radiation  dose 17.77 mGy Kerma. Please see performing physicians operative report for full details IMPRESSION: Fluoroscopic images were obtained for intraoperative guidance of right femoral fixation. Electronically Signed   By: Darrin Nipper M.D.   On: 11/02/2022 16:51   DG C-Arm 1-60 Min-No Report  Result Date: 11/02/2022 Fluoroscopy was utilized by the requesting physician.  No radiographic interpretation.   DG C-Arm 1-60 Min-No Report  Result Date: 11/02/2022 Fluoroscopy was utilized by the requesting physician.  No radiographic interpretation.   CT L-SPINE NO CHARGE  Result Date: 11/01/2022 CLINICAL DATA:  Right hip pain after fall. History of prostate cancer. EXAM: CT LUMBAR SPINE WITHOUT CONTRAST TECHNIQUE: Multidetector CT imaging of the lumbar spine was performed without intravenous contrast administration. Multiplanar CT image reconstructions were also generated. RADIATION DOSE REDUCTION: This exam was performed according to the departmental dose-optimization program which includes automated exposure control, adjustment of the mA and/or kV according to patient size and/or use of iterative reconstruction technique. COMPARISON:  CT abdomen pelvis dated June 03, 2020. FINDINGS: Segmentation: 5 lumbar type vertebrae. Alignment: Unchanged 8 mm anterolisthesis at L5-S1 due to chronic bilateral L5 pars defects. Vertebrae: No acute fracture. Diffuse sclerotic lesions throughout the  visualized thoracolumbar spine, ribs, and bony pelvis, progressed since 2021. Paraspinal and other soft tissues: Please see separate CT abdomen and pelvis report from same day. Incompletely visualized infrarenal abdominal aortic aneurysm. 6 mm calculus within a collapsed left renal pelvis. Disc levels: Unchanged diffuse moderate to severe degenerative disc disease mild-to-moderate facet arthropathy. IMPRESSION: 1. No acute fracture or traumatic malalignment of the lumbar spine. 2. Progressive diffuse sclerotic osseous metastatic disease. 3. Unchanged chronic bilateral L5 pars defects with 8 mm anterolisthesis at L5-S1. 4. Unchanged diffuse moderate to severe lumbar spondylosis. 5. Incompletely visualized abdominal aortic aneurysm. Please see separate CT abdomen pelvis report from same day for accurate measurements and follow-up recommendations. Electronically Signed   By: Titus Dubin M.D.   On: 11/01/2022 15:27   CT ABDOMEN PELVIS WO CONTRAST  Result Date: 11/01/2022 CLINICAL DATA:  Abdominal trauma. Blunt fall with pelvic and right leg pain. Patient fell landing on the right side. EXAM: CT ABDOMEN AND PELVIS WITHOUT CONTRAST TECHNIQUE: Multidetector CT imaging of the abdomen and pelvis was performed following the standard protocol without IV contrast. RADIATION DOSE REDUCTION: This exam was performed according to the departmental dose-optimization program which includes automated exposure control, adjustment of the mA and/or kV according to patient size and/or use of iterative reconstruction technique. COMPARISON:  06/03/2020 FINDINGS: Lower chest: Atelectasis or scarring in the lung bases. Mild basilar bronchiectasis, likely traction. Hepatobiliary: Multiple low-attenuation lesions demonstrated throughout the liver, most subcentimeter in size. Largest is 3.2 cm in diameter. Appearances are similar to previous study, likely representing multiple cysts. Gallbladder is surgically absent. Pancreas:  Unremarkable. No pancreatic ductal dilatation or surrounding inflammatory changes. Spleen: Calcified granulomas. Adrenals/Urinary Tract: No adrenal gland nodules. Stone in the left renal pelvis measuring 8 mm maximal diameter. This is unchanged in position since prior study. No hydronephrosis or hydroureter. Bladder wall is thickened with multiple trabeculations likely representing chronic bladder outlet obstruction. Similar appearance to prior study. Stomach/Bowel: Stomach, small bowel, and colon are not abnormally distended. No wall thickening or inflammatory changes are appreciated. Colonic diverticulosis without evidence of acute diverticulitis. Vascular/Lymphatic: Infrarenal abdominal aortic aneurysm measuring 5.3 cm AP dimension and 5.6 cm transverse dimension. The aneurysm is enlarged since prior study, with previous AP diameter measured at 5 cm. No periaortic stranding. Ectatic and calcified iliac arteries. No  significant lymphadenopathy. Reproductive: Prostate gland is decreased in size since prior study. Possibly interval radiation or medical therapy? Other: No free air or free fluid in the abdomen. Minimal periumbilical hernia containing fat. Musculoskeletal: Acute appearing fracture of the right femoral neck extending into the inter trochanteric region with mild impaction of the fracture fragments. Diffuse sclerotic metastases demonstrated throughout the visualized skeleton, increasing since the previous study. Spondylolysis with mild spondylolisthesis at L5-S1. IMPRESSION: 1. Acute inter trochanteric fracture of the right proximal femur. 2. Diffuse sclerotic bone metastases, increasing since prior study. 3. 5.3 by 5.6 cm infrarenal abdominal aortic aneurysm, increasing since prior study. Recommend referral to a vascular specialist. Reference: J Am Coll Radiol 1941;74:081-448. 4. No evidence of solid organ injury or bowel perforation. Electronically Signed   By: Lucienne Capers M.D.   On: 11/01/2022  15:24   DG Tibia/Fibula Right  Result Date: 11/01/2022 CLINICAL DATA:  Right leg pain after a fall. EXAM: RIGHT TIBIA AND FIBULA - 2 VIEW COMPARISON:  None Available. FINDINGS: There is no evidence of fracture or other focal bone lesions. Soft tissues are unremarkable. IMPRESSION: Negative. Electronically Signed   By: Misty Stanley M.D.   On: 11/01/2022 14:49   DG Femur Min 2 Views Right  Result Date: 11/01/2022 CLINICAL DATA:  Pain after a fall. EXAM: RIGHT FEMUR 2 VIEWS COMPARISON:  Hip films from earlier the same day. FINDINGS: Frog-leg lateral view of the femur shows apparent nondisplaced basicervical femoral neck fracture with angulation. This is not evident on the AP film and more evident than on the dedicated right hip study performed earlier today raising the question of some interval displacement. Heterogeneous mineralization but noted in the proximal femur in right pelvis with sclerotic lesions raising concern for metastatic involvement. IMPRESSION: 1. Imaging features highly suspicious for basicervical right femoral neck fracture, occult on AP imaging and seen only on the lateral image. Correlate clinically and CT or MR imaging could be used to be more definitive as clinically warranted. 2. Scattered sclerotic lesions in the right pelvis and proximal right femur concerning for metastatic disease. Electronically Signed   By: Misty Stanley M.D.   On: 11/01/2022 14:48   DG Chest Port 1 View  Result Date: 11/01/2022 CLINICAL DATA:  Hip fracture EXAM: PORTABLE CHEST 1 VIEW COMPARISON:  None Available. FINDINGS: The heart size and mediastinal contours are within normal limits. Atherosclerotic calcification of the aortic arch. Both lungs are clear. Diffuse sclerotic metastatic disease of the spine. IMPRESSION: 1. No active disease. 2. Diffuse sclerotic metastatic disease of the spine. Electronically Signed   By: Keane Police D.O.   On: 11/01/2022 13:34   DG Hip Unilat W or Wo Pelvis 2-3 Views  Right  Result Date: 11/01/2022 CLINICAL DATA:  Fall, hip pain EXAM: DG HIP (WITH OR WITHOUT PELVIS) 2-3V RIGHT COMPARISON:  None Available. FINDINGS: There is no evidence of hip fracture or dislocation. There are extensive sclerotic lesions throughout the bony pelvis, lower lumbar spine as well as in the proximal femurs suggesting metastatic disease. IMPRESSION: 1. No acute fracture or dislocation. 2. Extensive sclerotic lesions throughout the bony pelvis, lower lumbar spine as well as in the proximal femurs suggesting metastatic disease. Electronically Signed   By: Keane Police D.O.   On: 11/01/2022 13:33      Time coordinating discharge: over 30 minutes  SIGNED:  Emeterio Reeve DO [facility]

## 2022-11-10 NOTE — Care Management Important Message (Signed)
Important Message  Patient Details  Name: Jason Murphy MRN: [id number] Date of Birth: [birth year]   Medicare Important Message Given:  Yes  I reviewed the Important Message from Medicare with the patient's POA, Jacalyn Lefevre by phone [phone]. She is in agreement with the discharge and I thanked her for her time.    Juliann Pulse A Dorsie Burich 11/10/2022, 1:22 PM

## 2022-11-10 NOTE — Progress Notes (Signed)
Contacted [facility] in [place] and reported to nurse Apolonio Schneiders.

## 2022-11-10 NOTE — TOC Transition Note (Signed)
Transition of Care [facility]) - CM/SW Discharge Note   Patient Details  Name: Jason Murphy MRN: [id number] Date of Birth: Jul 02, 1920  [facility] CM/SW Contact:  Tiburcio Bash, LCSW Phone Number: 11/10/2022, 1:05 PM   Clinical Narrative:     Patient will DC XI:[facility] Anticipated DC date: 11/10/22 Transport by: [facility]  Per MD patient ready for DC to [facility] . RN, patient, patient's family, and facility notified of DC. Discharge Summary sent to facility. RN given number for report   [phone] Room 810. DC packet on chart. Ambulance transport requested for patient.  CSW signing off.  Pricilla Riffle, LCSW    Final next level of care: Skilled Nursing Facility Barriers to Discharge: No Barriers Identified   Patient Goals and CMS Choice CMS Medicare.gov Compare Post Acute Care list provided to:: Patient Choice offered to / list presented to : Patient  Discharge Placement                      Patient and family notified of of transfer: 11/10/22  Discharge Plan and Services Additional resources added to the After Visit Summary for                                       Social Determinants of Health (SDOH) Interventions SDOH Screenings   Food Insecurity: No Food Insecurity (11/02/2022)  Housing: Low Risk  (11/02/2022)  Transportation Needs: No Transportation Needs (11/02/2022)  Utilities: Not At Risk (11/02/2022)  Tobacco Use: Low Risk  (11/03/2022)     Readmission Risk Interventions     No data to display

## 2022-11-10 NOTE — Progress Notes (Signed)
EMS transported pt from [facility] to [facility].

## 2022-11-21 ENCOUNTER — Encounter: Payer: Self-pay | Admitting: Oncology

## 2022-11-21 ENCOUNTER — Non-Acute Institutional Stay: Payer: Medicare HMO | Admitting: Nurse Practitioner

## 2022-11-21 ENCOUNTER — Encounter: Payer: Self-pay | Admitting: Nurse Practitioner

## 2022-11-21 DIAGNOSIS — R52 Pain, unspecified: Secondary | ICD-10-CM

## 2022-11-21 DIAGNOSIS — R5381 Other malaise: Secondary | ICD-10-CM

## 2022-11-21 DIAGNOSIS — Z515 Encounter for palliative care: Secondary | ICD-10-CM

## 2022-11-21 NOTE — Progress Notes (Signed)
[facility] Pall[facility] Care Consult Note Telephone: [phone]  Fax: [phone]   Date of encounter: 11/21/22 2:52 PM PATIENT NAME: Jason Murphy [address]  [address] [place]  [address]   [phone] (home)  DOB: 07-Oct-1920 MRN: [id number] PRIMARY CARE PROVIDER:    [facility]  RESPONSIBLE PARTY:    Contact Information     Name Relation Home Work Mobile   [place] Daughter   [phone]   [place] Daughter [phone]     [place] Daughter   [phone]      I met face to face with patient in facility. Palliative Care was asked to follow this patient by consultation request of [facility]  to address advance care planning and complex medical decision making. This is the initial visit.  Symptom Management/Plan: 1. Advance Care Planning;   2. Goals of Care: Goals include to maximize quality of life and symptom management. Our advance care planning conversation included a discussion about:    The value and importance of advance care planning  Exploration of personal, cultural or spiritual beliefs that might influence medical decisions  Exploration of goals of care in the event of a sudden injury or illness  Identification and preparation of a healthcare agent  Review and updating or creation of an advance directive document. 3. Palliative care encounter; Palliative care encounter; Palliative medicine team will continue to support patient, patient's family, and medical team. Visit consisted of counseling and education dealing with the complex and emotionally intense issues of symptom management and palliative care in the setting of serious and potentially life-threatening illness  4. Debility secondary to right intertrochanteric hip fracture s/p sgy; continue to work with therapy for strengthening, balance, mobility, self independence. Talked about fall risk and precautions.  5. Pain secondary to right  intertrochanteric hip fracture s/p sgy; currently controlled on current regimen; reviewed medications, continue to monitor on pain scale, encourage mobility, continue to work with therapy and pain modality.  Follow up Palliative Care Visit: Palliative care will continue to follow for complex medical decision making, advance care planning, and clarification of goals. Return 1 to 2 weeks or prn.  I spent 51 minutes providing this consultation visit started at 12:30 pm. More than 50% of the time in this consultation was spent in counseling and care coordination. PPS: 40%  Chief Complaint: Initial palliative consult for complex medical decision making, address goals, manage ongoing symptoms  HISTORY OF PRESENT ILLNESS:  KAULDER ZAHNER is a 89 y.o. year old male  with multiple medical problems including advanced metastatic prostate cancer, CAD, hypertension, hyperlipidemia, OSA, RLS. Hospitalized for right hip pain following a fall at ALF resulting in right femoral neck fracture. Mr Tarnow required Intramedullary fixation for right intertrochanteric hip fracture without complication on 1/88/4166. Mr Emond hospitalization was complicated by hypokalemia resolved with replacement, hospital delirium which improved secondary to sgy/post op pain, medications; HCAP completed abx which resolved. Mr Horger was stabilized and d/c to STR at [facility] where he currently resides. Purpose of today PC f/u visit further discussion monitor trends of appetite, weights, monitor for functional, cognitive decline with chronic disease progression, assess any active symptoms, supportive role. Mr Bunton at present is sitting in the chair in his room. Mr Goyne and I talked about purpose of pc visit, past medical history, family, social history, ros, functional abilities. Medications, medical goals, poc reviewed. Mr Shanholtzer is frustrated endorses he has lived a good life, ready to go, denies  SI/HI. Mr Stites endorses he is tired and when it is  his time it is his time. Mr Marohl endorses he is 89 yrs old and should be able to go on to the next life. We talked about his spirituality, his family at length, symptoms, his expectations with therapy and following d/c Therapeutic listening, emotional support provided. Questions answered. Attempted to contact dtg for update on pc visit. Updated staff. PC f/u visit further discussion monitor trends of appetite, weights, monitor for functional, cognitive decline with chronic disease progression, assess any active symptoms, supportive role.  History obtained from review of EMR, discussion with primary team, and interview with family, facility staff/caregiver and/or Mr. Chandran.  I reviewed available labs, medications, imaging, studies and related documents from the EMR.  Records reviewed and summarized above.   Physical Exam: Constitutional: NAD General: frail appearing, thin, pleasant male ENMT: oral mucous membranes moist CV: S1S2, RRR Pulmonary: LCTA Abdomen: normo-active BS + 4 quadrants, soft and non tender MSK: w/c dependent Skin: warm and dry Neuro:  + generalized weakness,  no cognitive impairment Psych: non-anxious affect, A and O x 3 CURRENT PROBLEM LIST:  Patient Active Problem List   Diagnosis Date Noted   Problems of adjustment to life-cycle transitions 11/07/2022   Femur fracture ([place]) 11/01/2022   Infrarenal abdominal aortic aneurysm (AAA) without rupture ([place]) 11/01/2022   Mild cognitive impairment of uncertain or unknown etiology 02/21/2022   TIA (transient ischemic attack) 02/21/2022   Protein-calorie malnutrition, severe 01/01/2021   AMS (altered mental status) 12/31/2020   Sepsis ([place]) 10/06/2020   Syncope    Acute cystitis without hematuria    Goals of care, counseling/discussion 08/15/2020   Hypothyroidism, adult 02/28/2018   Hypertension    Coronary artery disease    RLS (restless legs syndrome) 09/21/2017   Paroxysmal A-fib ([place]) 01/08/2016   Mixed hyperlipidemia  01/08/2016   Hyponatremia 01/03/2016   Anxiety state 02/03/2015   Stress at home 02/03/2015   OBSTRUCTIVE SLEEP APNEA 10/22/2010   PROSTATE CANCER 10/21/2010   HYPERCHOLESTEROLEMIA 10/21/2010   Essential hypertension 10/21/2010   CORONARY ARTERY DISEASE 10/21/2010   Lightheadedness 10/21/2010   PAST MEDICAL HISTORY:  Active Ambulatory Problems    Diagnosis Date Noted   PROSTATE CANCER 10/21/2010   HYPERCHOLESTEROLEMIA 10/21/2010   OBSTRUCTIVE SLEEP APNEA 10/22/2010   Essential hypertension 10/21/2010   CORONARY ARTERY DISEASE 10/21/2010   Lightheadedness 10/21/2010   Anxiety state 02/03/2015   Stress at home 02/03/2015   Hyponatremia 01/03/2016   Hypertension    Coronary artery disease    Paroxysmal A-fib ([place]) 01/08/2016   Mixed hyperlipidemia 01/08/2016   RLS (restless legs syndrome) 09/21/2017   Goals of care, counseling/discussion 08/15/2020   Sepsis ([place]) 10/06/2020   Syncope    Acute cystitis without hematuria    AMS (altered mental status) 12/31/2020   Protein-calorie malnutrition, severe 01/01/2021   Mild cognitive impairment of uncertain or unknown etiology 02/21/2022   Hypothyroidism, adult 02/28/2018   TIA (transient ischemic attack) 02/21/2022   Femur fracture ([place]) 11/01/2022   Infrarenal abdominal aortic aneurysm (AAA) without rupture ([place]) 11/01/2022   Problems of adjustment to life-cycle transitions 11/07/2022   Resolved Ambulatory Problems    Diagnosis Date Noted   No Resolved Ambulatory Problems   Past Medical History:  Diagnosis Date   Cancer ([place])    Dizziness    Dyspnea    Fatigue    Hypercholesterolemia    Obstructive sleep apnea    Pneumonia    Restless leg syndrome  SOCIAL HX:  Social History   Tobacco Use   Smoking status: Never   Smokeless tobacco: Never  Substance Use Topics   Alcohol use: Yes    Comment: 1/2 glass wine daily   FAMILY HX:  Family History  Problem Relation Age of Onset   COPD Mother       ALLERGIES:  No Known Allergies   PERTINENT MEDICATIONS:  Outpatient Encounter Medications as of 11/21/2022  Medication Sig   aspirin EC 81 MG tablet Take 81 mg by mouth at bedtime.   bisacodyl (DULCOLAX) 10 MG suppository Place 1 suppository (10 mg total) rectally daily as needed for moderate constipation.   Cholecalciferol (VITAMIN D3) 1000 units CAPS Take 1,000 Units by mouth daily.   co-enzyme Q-10 50 MG capsule Take 100 mg by mouth every morning. (Patient not taking: Reported on 11/01/2022)   cyanocobalamin 1000 MCG tablet Take 1,000 mcg by mouth daily.   docusate sodium (COLACE) 100 MG capsule Take 1 capsule (100 mg total) by mouth 2 (two) times daily.   enoxaparin (LOVENOX) 40 MG/0.4ML injection Inject 0.4 mLs (40 mg total) into the skin daily for 25 days.   feeding supplement (ENSURE ENLIVE / ENSURE PLUS) LIQD Take 237 mLs by mouth 3 (three) times daily between meals.   finasteride (PROSCAR) 5 MG tablet Take 5 mg by mouth every evening.   levothyroxine (SYNTHROID) 50 MCG tablet Take 50 mcg by mouth daily before breakfast.   Multiple Vitamins-Minerals (PRESERVISION AREDS 2 PO) Take 1 tablet by mouth daily.   omeprazole (PRILOSEC) 20 MG capsule Take 20 mg by mouth daily.   polyethylene glycol (MIRALAX / GLYCOLAX) 17 g packet Take 17 g by mouth daily as needed for mild constipation.   rOPINIRole (REQUIP) 1 MG tablet Take 1 mg by mouth at bedtime.   simvastatin (ZOCOR) 40 MG tablet TAKE 1 TABLET BY MOUTH EVERY NIGHT AT BEDTIME (Patient taking differently: Take 40 mg by mouth at bedtime.)   traMADol (ULTRAM) 50 MG tablet Take 1 tablet (50 mg total) by mouth every 6 (six) hours.   [DISCONTINUED] pramipexole (MIRAPEX) 0.5 MG tablet Take 0.5 mg by mouth at bedtime.   No facility-administered encounter medications on file as of 11/21/2022.   Thank you for the opportunity to participate in the care of Mr. Lopata.  The palliative care team will continue to follow. Please call our office at [phone] if we can be  of additional assistance.   Kayce Chismar Z Shianna Bally, NP ,

## 2022-11-22 ENCOUNTER — Telehealth: Payer: Self-pay | Admitting: Nurse Practitioner

## 2022-11-22 NOTE — Telephone Encounter (Signed)
I called Collie Siad, Mr Sagar dtg and she conference [name] in on the call. We talked about pc visit with Mr Galentine, past medical history with recent hospitalization, symptoms. We talked about therapy with d/c planning as Mr Tinnell will not be able to return to his independent living facility. We talked about transition to LTC, medicaid application with social security office and department of veterans services as Mr Bisig is a veteran to see options. Defer to [facility] [employer] officer, museum. Discussed and would recommend psychiatry consult for management of anxiety with focus on comfort and to see if Mr Amirault is competent to make his own decisions, financially, medically, also pertaining to poc with d/c planning. We talked about family dynamics, overall decline, cancer in addition to recent fracture, functional and cognitive decline. We talked about role pc in poc, hospice benefit through medicare program, therapy under medicare and what happens when ends if Mr Strollo is not able to go back to his independent living with he is likely not going to be able to. Discussed appetite, overall decline. Support provided. Discussed will continue to follow Mr Brager at the facility with poc.   Daughters will be contacting:  Veterans services to see what benefits Mr Antosh is able to receive including LTC  Psychiatry request for consult for anxiety management and determine competence for guardianship may require emergency guardian if family not able. Dtg's endorses may request guardian as they may not be able to function for Mr Rosato in that role.   [facility] Forensic psychologist to see what options for payment for LTC as currently he is paying privately for independent living  Ms [place] Worker [facility] to further discuss d/c planning, LTC placement, Medicaid application, psychiatry consult request  Social security office to inquire about Medicaid application  Total time spent 45 minutes Documentation 5 minutes Phone discussion 40 minutes

## 2022-11-29 ENCOUNTER — Encounter: Payer: Self-pay | Admitting: Nurse Practitioner

## 2022-11-29 ENCOUNTER — Non-Acute Institutional Stay: Payer: Medicare HMO | Admitting: Nurse Practitioner

## 2022-11-29 DIAGNOSIS — F4321 Adjustment disorder with depressed mood: Secondary | ICD-10-CM

## 2022-11-29 DIAGNOSIS — R52 Pain, unspecified: Secondary | ICD-10-CM

## 2022-11-29 DIAGNOSIS — R5381 Other malaise: Secondary | ICD-10-CM

## 2022-11-29 DIAGNOSIS — F4320 Adjustment disorder, unspecified: Secondary | ICD-10-CM

## 2022-11-29 DIAGNOSIS — Z515 Encounter for palliative care: Secondary | ICD-10-CM

## 2022-11-29 NOTE — Progress Notes (Signed)
[facility] Palliative Care Consult Note Telephone: [phone]  Fax: [phone]    Date of encounter: 11/29/22 12:22 PM PATIENT NAME: Jason Murphy [address]  [address] [place] [address]   [phone] (home)  DOB: 1920-03-16 MRN: [id number] PRIMARY CARE PROVIDER:    [facility]  RESPONSIBLE PARTY:    Contact Information     Name Relation Home Work Mobile   Jason Murphy Daughter   [phone]   Jason Murphy Daughter [phone]     Jason Murphy Daughter   [phone]     I  met face to face with patient in facility. Palliative Care was asked to follow this patient by consultation request of [facility]  to address advance care planning and complex medical decision making. This is the initial visit.  Symptom Management/Plan: 1. Advance Care Planning;  Ongoing discussions 2. Goals of Care: Goals include to maximize quality of life and symptom management. Our advance care planning conversation included a discussion about:    The value and importance of advance care planning  Exploration of personal, cultural or spiritual beliefs that might influence medical decisions  Exploration of goals of care in the event of a sudden injury or illness  Identification and preparation of a healthcare agent  Review and updating or creation of an advance directive document. 3. Palliative care encounter; Palliative care encounter; Palliative medicine team will continue to support patient, patient's family, and medical team. Visit consisted of counseling and education dealing with the complex and emotionally intense issues of symptom management and palliative care in the setting of serious and potentially life-threatening illness   4. Debility slow to improve secondary to right intertrochanteric hip fracture s/p sgy; continue to work with therapy for strengthening, balance, mobility, self independence. Talked about fall risk and  precautions.   5. Pain secondary to right intertrochanteric hip fracture s/p sgy; currently controlled on current regimen; reviewed medications, continue to monitor on pain scale, encourage mobility, continue to work with therapy and pain modality.   6. Anorexia ongoing reviewed weights continue to monitor weights. Encourage nutrition, supplements.  11/23/2022 weight 164 lbs 11/10/2022 weight 164.6 lbs 7. Grief situational due to current situation of STR, loss of independence of unable to return to his home, continue with psych consult, managing of anxiety and further evaluation for competence. Jason Murphy was open about the fact that he would not be able to return to Independent living, would require placement  Follow up Palliative Care Visit: Palliative care will continue to follow for complex medical decision making, advance care planning, and clarification of goals. Return 1 to 2 weeks or prn.   I spent 45 minutes providing this consultation visit started at 11:15 am. More than 50% of the time in this consultation was spent in counseling and care coordination. PPS: 40%   Chief Complaint: Initial palliative consult for complex medical decision making, address goals, manage ongoing symptoms   HISTORY OF PRESENT ILLNESS:  Jason Murphy is a 89 y.o. year old male  with multiple medical problems including advanced metastatic prostate cancer, CAD, hypertension, hyperlipidemia, OSA, RLS. Hospitalized for right hip pain following a fall at ALF resulting in right femoral neck fracture. Jason Murphy required Intramedullary fixation for right intertrochanteric hip fracture without complication on [id number]. Jason Murphy hospitalization was complicated by hypokalemia resolved with replacement, hospital delirium which improved secondary to sgy/post op pain, medications; HCAP completed abx which resolved. Jason Murphy was stabilized and d/c to STR at  [facility] where he currently resides. Purpose of today PC f/u visit further  discussion monitor trends of appetite, weights, monitor for functional, cognitive decline with chronic disease progression, assess any active symptoms, supportive role. Jason Murphy at present is sitting in the chair in his room, just finished OT. Jason Murphy does make eye contact, answers questions. Jason Murphy ruminates about being 89 yrs old and "why am I still here". "I am old". "I don't understand why I am still here". Denies SI. We talked about ros, his functional abilities limited and does not appear he will get back to previous baseline. We talked about his functional decline, previously residing independently, currently requiring assistance for stand to pivot for transfers, toileting. Requiring assistance for putting pants, socks, shoes on. Would likely not be able to fix his meals, does feed himself with ongoing declined appetite. We talked at length about his wishes to pass, coping strategies, reviewed SI currently denies. We talked about challenges about loss of independence in the setting of aging. Jason Murphy and I talked about increase in skill level he may not be able to return to independent living situation and require LTC placement. Jason Murphy endorses his step daughter is on his financial, and he could move there though discussed with him his daughters concerns about her health problems. Jason Murphy and I talked about realistic expectations of placement as LTC would be better choice. We talked about symptoms of pain, appetite, weights. We talked about progression of aging. Support provided. Will f/u with dtg's and SW at [facility] to see about STR days left and further planning, psychiatry consult. Jason Murphy was thankful for pc visit, therapeutic listening, emotional support provided. Updated staff. I called Jason Murphy dtg with clinical update, pc visit and planning, lengthly discussion of further plans, received Medicaid application and in process of completing. Also psychiatry evaluated and determined competent to make his  decisions, medicated for anxiety. Questions answered.    History obtained from review of EMR, discussion with primary team, and interview with family, facility staff/caregiver and/or Jason Murphy.  I reviewed available labs, medications, imaging, studies and related documents from the EMR.  Records reviewed and summarized above.    Physical Exam: Constitutional: NAD General: frail appearing, elderly male ENMT: oral mucous membranes moist CV: S1S2, RRR Pulmonary: LCTA Abdomen: normo-active BS + 4 quadrants, soft and non tender MSK: w/c dependent; stand pivot with moderate assistance Skin: warm and dry Neuro:  + generalized weakness,   Psych: non-anxious affect, A and O x 3  Thank you for the opportunity to participate in the care of Jason Murphy. Please call our office at [phone] if we can be of additional assistance.   Adalberto Metzgar Ihor Gully, NP

## 2022-12-01 ENCOUNTER — Non-Acute Institutional Stay: Payer: Medicare HMO | Admitting: Nurse Practitioner

## 2022-12-01 ENCOUNTER — Encounter: Payer: Self-pay | Admitting: Nurse Practitioner

## 2022-12-01 DIAGNOSIS — R52 Pain, unspecified: Secondary | ICD-10-CM

## 2022-12-01 DIAGNOSIS — Z515 Encounter for palliative care: Secondary | ICD-10-CM

## 2022-12-01 DIAGNOSIS — R5381 Other malaise: Secondary | ICD-10-CM

## 2022-12-01 DIAGNOSIS — F4321 Adjustment disorder with depressed mood: Secondary | ICD-10-CM

## 2022-12-01 NOTE — Progress Notes (Signed)
[facility] Consult Note Telephone: [phone]  Fax: [phone]    Date of encounter: 12/01/22 3:25 PM PATIENT NAME: Jason Murphy [address]  [address] [place] [address]   [phone] (home)  DOB: 28-Nov-1919 MRN: [id number] PRIMARY CARE PROVIDER:  [facility]  RESPONSIBLE PARTY:    Contact Information     Name Relation Home Work Mobile   [place] Daughter   [phone]   Sydnee Cabal Daughter [phone]     [place] Daughter   [phone]     I  met face to face with patient in facility. Palliative Care was asked to follow this patient by consultation request of [facility]  to address advance care planning and complex medical decision making. This is the initial visit.  Symptom Management/Plan: 1. Advance Care Planning;  Ongoing discussions 2. Goals of Care: Goals include to maximize quality of life and symptom management. Our advance care planning conversation included a discussion about:    The value and importance of advance care planning  Exploration of personal, cultural or spiritual beliefs that might influence medical decisions  Exploration of goals of care in the event of a sudden injury or illness  Identification and preparation of a healthcare agent  Review and updating or creation of an advance directive document. 3. Palliative care encounter; Palliative care encounter; Palliative medicine team will continue to support patient, patient's family, and medical team. Visit consisted of counseling and education dealing with the complex and emotionally intense issues of symptom management and palliative care in the setting of serious and potentially life-threatening illness   4. Debility slow to improve secondary to right intertrochanteric hip fracture s/p sgy; continue to work with therapy for strengthening, balance, mobility, self independence. Talked about fall risk and  precautions. Continues to be non-ambulatory without assistance   5. Pain secondary to right intertrochanteric hip fracture s/p sgy; currently controlled on current regimen; reviewed medications, continue to monitor on pain scale, encourage mobility, continue to work with therapy and pain modality.    6. Anorexia ongoing decline; reviewed weights continue to monitor weights. Encourage nutrition, supplements.  11/23/2022 weight 164 lbs 11/10/2022 weight 164.6 lbs 7. Grief situational ongoing; due to current situation of STR, loss of independence of unable to return to his home, continue with psych consult, managing of anxiety, was determined he is competent for his decision making.  Follow up Palliative Care Visit: Palliative care will continue to follow for complex medical decision making, advance care planning, and clarification of goals. Return 1 to 2 weeks or prn.   I spent 47 minutes providing this consultation visit started at 12:30pm. More than 50% of the time in this consultation was spent in counseling and care coordination. PPS: 40%   Chief Complaint: Initial palliative consult for complex medical decision making, address goals, manage ongoing symptoms   HISTORY OF PRESENT ILLNESS:  Jason Murphy is a 89 y.o. year old male  with multiple medical problems including advanced metastatic prostate cancer, CAD, hypertension, hyperlipidemia, OSA, RLS. Hospitalized for right hip pain following a fall at ALF resulting in right femoral neck fracture. Jason Murphy required Intramedullary fixation for right intertrochanteric hip fracture without complication on 123XX123. Jason Murphy hospitalization was complicated by hypokalemia resolved with replacement, hospital delirium which improved secondary to sgy/post op pain, medications; HCAP completed abx which resolved. Jason Murphy was stabilized and d/c to STR at [facility] where he currently resides. Purpose of today PC f/u  visit further discussion monitor trends of  appetite, weights, monitor for functional, cognitive decline with chronic disease progression, assess any active symptoms, supportive role. Jason Murphy at present is sitting in the chair in his room, just finished OT. Jason Murphy does make eye contact, answers questions. Jason Murphy ruminates about being 89 yrs old and "why am I still here". "I am old". "I don't understand why I am still here". Denies SI. We talked about ros, his functional abilities limited and does not appear he will get back to previous baseline. We talked about his functional decline, previously residing independently, currently requiring assistance for stand to pivot for transfers, toileting. Requiring assistance for putting pants, socks, shoes on. Would likely not be able to fix his meals, does feed himself with ongoing declined appetite. We talked at length about his wishes to pass, coping strategies, reviewed SI currently denies. We talked about challenges about loss of independence in the setting of aging. Jason Murphy and I talked about increase in skill level he may not be able to return to independent living situation and require LTC placement. Jason Murphy endorses his step daughter is on his financial, and he could move there though discussed with him his daughters concerns about her health problems. Jason Murphy and I talked about realistic expectations of placement as LTC would be better choice. We talked about symptoms of pain, appetite, weights. Jason Murphy endorses he is frustrated about his current situation of being in STR, not able to ambulate, frustrated he is not hungry. Jason Murphy endorses he is working with his daughters trying to figure things out. Explained to Jason Murphy that his daughters are working on a Medicaid application which would help him be able to stay in a LTC facility. We talked about possible [facility] where he is or another facility when STR is completed. We talked about focus for next several days while paperwork is being completed to continue to  work with therapy, concentrate on nutrition, eating. We talked about coping strategies. Questions answered. Pain is controlled with current regimen. Therapeutic listening, emotional support provided, attempted to reach out to [place]. Will continue to follow close with monitoring of appetite, progress with therapy with support while transitioning to LTC.    History obtained from review of EMR, discussion with primary team, and interview with family, facility staff/caregiver and/or Jason. Pulkrabek.  I reviewed available labs, medications, imaging, studies and related documents from the EMR.  Records reviewed and summarized above.    Physical Exam: General: frail appearing, elderly male ENMT: oral mucous membranes moist CV: S1S2, RRR Pulmonary: LCTA Abdomen: normo-active BS + 4 quadrants, soft and non tender MSK: w/c dependent; stand pivot with moderate assistance Neuro:  + generalized weakness Psych: non-anxious affect, A and O x 3 Thank you for the opportunity to participate in the care of Jason. Madeja. Please call our office at [phone] if we can be of additional assistance.   Nancey Kreitz Ihor Gully, NP

## 2022-12-06 ENCOUNTER — Non-Acute Institutional Stay: Payer: Medicare HMO | Admitting: Nurse Practitioner

## 2022-12-06 ENCOUNTER — Encounter: Payer: Self-pay | Admitting: Nurse Practitioner

## 2022-12-06 DIAGNOSIS — R5381 Other malaise: Secondary | ICD-10-CM

## 2022-12-06 DIAGNOSIS — F4321 Adjustment disorder with depressed mood: Secondary | ICD-10-CM

## 2022-12-06 DIAGNOSIS — R52 Pain, unspecified: Secondary | ICD-10-CM

## 2022-12-06 DIAGNOSIS — Z515 Encounter for palliative care: Secondary | ICD-10-CM

## 2022-12-06 DIAGNOSIS — R630 Anorexia: Secondary | ICD-10-CM

## 2022-12-06 NOTE — Progress Notes (Signed)
[facility] Consult Note Telephone: [phone]  Fax: [phone]    Date of encounter: 12/06/22 5:07 PM PATIENT NAME: Jason Murphy [address]  [address] [place] [address]   [phone] (home)  DOB: April 14, 1920 MRN: [id number] PRIMARY CARE PROVIDER:    [facility]  RESPONSIBLE PARTY:    Contact Information     Name Relation Home Work Mobile   [place] Daughter   [phone]   Sydnee Cabal Daughter [phone]     [place] Daughter   [phone]     I  met face to face with patient in facility. Palliative Care was asked to follow this patient by consultation request of [facility]  to address advance care planning and complex medical decision making. This is the initial visit.  Symptom Management/Plan: 1. Advance Care Planning;  Ongoing discussions 2. Goals of Care: Goals include to maximize quality of life and symptom management. Our advance care planning conversation included a discussion about:    The value and importance of advance care planning  Exploration of personal, cultural or spiritual beliefs that might influence medical decisions  Exploration of goals of care in the event of a sudden injury or illness  Identification and preparation of a healthcare agent  Review and updating or creation of an advance directive document. 3. Palliative care encounter; Palliative care encounter; Palliative medicine team will continue to support patient, patient's family, and medical team. Visit consisted of counseling and education dealing with the complex and emotionally intense issues of symptom management and palliative care in the setting of serious and potentially life-threatening illness   4. Debility slow to improve secondary to right intertrochanteric hip fracture s/p sgy; continue to work with therapy for strengthening, balance, mobility, self independence. Talked about fall risk and  precautions. Continues to be non-ambulatory without assistance   5. Pain controlled secondary to right intertrochanteric hip fracture s/p sgy; currently controlled on current regimen; reviewed medications, continue to monitor on pain scale, encourage mobility, continue to work with therapy and pain modality.    6. Anorexia ongoing decline; reviewed weights continue to monitor weights. Encourage nutrition, supplements.  11/10/2022 weight 164.6 lbs 11/23/2022 weight 164 lbs 7. Grief situational ongoing; due to current situation of STR, loss of independence of unable to return to his home, continue with psych consult, managing of anxiety, was determined he is competent for his decision making.  Follow up Palliative Care Visit: Palliative care will continue to follow for complex medical decision making, advance care planning, and clarification of goals. Return 1 to 2 weeks or prn.   I spent 45 minutes providing this consultation visit started at 9:45 am. More than 50% of the time in this consultation was spent in counseling and care coordination. PPS: 40%   Chief Complaint: Initial palliative consult for complex medical decision making, address goals, manage ongoing symptoms   HISTORY OF PRESENT ILLNESS:  Jason Murphy is a 89 y.o. year old male  with multiple medical problems including advanced metastatic prostate cancer, CAD, hypertension, hyperlipidemia, OSA, RLS. Hospitalized for right hip pain following a fall at ALF resulting in right femoral neck fracture. Jason Murphy required Intramedullary fixation for right intertrochanteric hip fracture without complication on 123XX123. Jason Murphy hospitalization was complicated by hypokalemia resolved with replacement, hospital delirium which improved secondary to sgy/post op pain, medications; HCAP completed abx which resolved. Jason Murphy was stabilized and d/c to STR at [facility] where he currently resides. Purpose  of today PC f/u visit further discussion monitor  trends of appetite, weights, monitor for functional, cognitive decline with chronic disease progression, assess any active symptoms, supportive role. Jason Murphy at present is sitting in the w/c with his head lying on his bed. We talked about symptoms of pain, appetite, weights. Jason Murphy asked about running out of money. Explained to Jason Morgret that his daughters are working on a Medicaid application which would help him be able to stay in a LTC facility which he is requesting ALF. We talked about possible [facility] where he is or another facility when STR is completed. We talked continue to work with therapy, concentrate on nutrition, eating. We talked about coping strategies, his frustrations with process, appetite, work with therapy, sleeping patterns, sleeping hygiene, trying to avoid naps during the day. Therapeutic listening, emotional support provided. Questions answered. Pain is controlled with current regimen. I attempted to reach out to [place]. Will continue to follow close with monitoring of appetite, progress with therapy with support while transitioning to ALF or LTC, more likely LTC though he is attempting to get placed in ALF.    History obtained from review of EMR, discussion with primary team, and interview with family, facility staff/caregiver and/or Jason. Degrande.  I reviewed available labs, medications, imaging, studies and related documents from the EMR.  Records reviewed and summarized above.    Physical Exam: General: frail appearing, elderly male ENMT: oral mucous membranes moist CV: S1S2, RRR Pulmonary: LCTA MSK: w/c dependent; stand pivot with moderate assistance Neuro:  + generalized weakness Psych: non-anxious affect, A and O x 3  Thank you for the opportunity to participate in the care of Jason. Murphy. Please call our office at [phone] if we can be of additional assistance.   Leen Tworek Ihor Gully, NP

## 2022-12-13 ENCOUNTER — Encounter: Payer: Self-pay | Admitting: Oncology

## 2022-12-13 ENCOUNTER — Non-Acute Institutional Stay: Payer: Medicare HMO | Admitting: Nurse Practitioner

## 2022-12-13 ENCOUNTER — Encounter: Payer: Self-pay | Admitting: Nurse Practitioner

## 2022-12-13 DIAGNOSIS — R630 Anorexia: Secondary | ICD-10-CM

## 2022-12-13 DIAGNOSIS — Z515 Encounter for palliative care: Secondary | ICD-10-CM

## 2022-12-13 DIAGNOSIS — F4321 Adjustment disorder with depressed mood: Secondary | ICD-10-CM

## 2022-12-13 DIAGNOSIS — R52 Pain, unspecified: Secondary | ICD-10-CM

## 2022-12-13 DIAGNOSIS — R5381 Other malaise: Secondary | ICD-10-CM

## 2022-12-13 NOTE — Progress Notes (Signed)
[facility] Consult Note Telephone: [phone]  Fax: [phone]    Date of encounter: 12/13/22 11:45 AM PATIENT NAME: Jason Murphy [address]  [place] [address]   [phone] (home)  DOB: 10-07-1920 MRN: [id number] PRIMARY CARE PROVIDER:    [facility]  RESPONSIBLE PARTY:    Contact Information     Name Relation Home Work Mobile   [place] Daughter   [phone]   Sydnee Cabal Daughter [phone]     [place] Daughter   [phone]     I  met face to face with patient in facility. Palliative Care was asked to follow this patient by consultation request of [facility]  to address advance care planning and complex medical decision making. This is the initial visit.  Symptom Management/Plan: 1. Advance Care Planning;  +DNR;  Advanced metastatic prostate cancer Intramedullary fixation for right intertrochanteric hip fracture without complication on 123XX123. Anorexia; weight loss;  PPS prior to hospitalization 60% Currently low 40% close to 30%  2. Goals of Care: Goals include to maximize quality of life and symptom management. Our advance care planning conversation included a discussion about:    The value and importance of advance care planning  Exploration of personal, cultural or spiritual beliefs that might influence medical decisions  Exploration of goals of care in the event of a sudden injury or illness  Identification and preparation of a healthcare agent  Review and updating or creation of an advance directive document. 3. Palliative care encounter; Palliative care encounter; Palliative medicine team will continue to support patient, patient's family, and medical team. Visit consisted of counseling and education dealing with the complex and emotionally intense issues of symptom management and palliative care in the setting of serious and potentially life-threatening  illness   4. Debility slow to improve secondary to right intertrochanteric hip fracture s/p sgy; continue to work with therapy for strengthening, balance, mobility, self independence. Talked about fall risk and precautions. Continues to be non-ambulatory without assistance   5. Pain controlled secondary to right intertrochanteric hip fracture s/p sgy; currently controlled on current regimen; reviewed medications, continue to monitor on pain scale, encourage mobility, continue to work with therapy and pain modality.    6. Anorexia ongoing due to advanced metastatic prostate cancer, debility, decompensation reviewed weights continue to monitor weights. Encourage nutrition, supplements.  03/29/2022 weight 169 lbs 11/10/2022 weight 164.6 lbs 11/23/2022 weight 164 lbs No new weight though appears to have lost more with muscle wasting;   7. Grief situational ongoing; due to current situation of STR, loss of independence of unable to return to his home, continue with psych consult, managing of anxiety, was determined he is competent for his decision making.  Follow up Palliative Care Visit: Palliative care will continue to follow for complex medical decision making, advance care planning, and clarification of goals. Return 1 to 2 weeks or prn.   I spent 45 minutes providing this consultation visit started at  10:00 am. More than 50% of the time in this consultation was spent in counseling and care coordination. PPS: 40%   Chief Complaint: Initial palliative consult for complex medical decision making, address goals, manage ongoing symptoms   HISTORY OF PRESENT ILLNESS:  Jason Murphy is a 89 y.o. year old male  with multiple medical problems including advanced metastatic prostate cancer, CAD, hypertension, hyperlipidemia, OSA, RLS. Hospitalized for right hip pain following a fall at ALF resulting in right femoral  neck fracture. Jason Murphy required Intramedullary fixation for right intertrochanteric hip  fracture without complication on 123XX123. Jason Murphy hospitalization was complicated by hypokalemia resolved with replacement, hospital delirium which improved secondary to sgy/post op pain, medications; HCAP completed abx which resolved. Jason Murphy was stabilized and d/c to STR at [facility] for STR, now transitioned to LTC with medicaid application pending. Purpose of today PC f/u visit further discussion monitor trends of appetite, weights, monitor for functional, cognitive decline with chronic disease progression, assess any active symptoms, supportive role, family meeting. Jason Drone NP [facility] student and I visited Jason Murphy present. We talked with Jason Murphy about how he has been feeling, Jason Murphy endorses "I don't know why I am here". Jason Murphy ruminates about why God has not taken him, focuses on he is 89 yrs old. We talked about coping strategies, support provided. We talked about ros, pain, therapy has completed. Collie Siad and [place] daughters stepped out. Jason Shahil, [place] NP [facility] student and I talked about transition to LTC with Medicaid and now able to get hospice evaluation. We talked about what services hospice provides under Medicare benefit. Jason Murphy was in agreement to proceed with hospice as per last psych evaluation found Jason Murphy deemed competent per [facility] for decision making. Jason Drone, NP [facility] student and I met separately with daughters Collie Siad and [name] from [place]. Clinical update discussed. We talked about d/c from therapy, transition to LTC, overall decline, debility functionally and cognitively. We talked about family dynamics, ros including pain, medications, hospice services under Medicare benefit with pending Medicaid application. We talked about PC visit with Jason Murphy and wishes to proceed with Hospice, notified Liz Malady NP and order for hospice evaluation received, notified [facility] and requested SW to attend if possible. Discussed role pc in poc, and [facility] called  Collie Siad to arrange for 2pm today AV assessment. Talked about challenges residing [place], decision making and progression of chronic disease on acute illness of 89 yo male. Therapeutic listening, emotional support provided. Questions answered, updated staff.    History obtained from review of EMR, discussion with primary team, and interview with family, facility staff/caregiver and/or Jason. Friedlander.  I reviewed available labs, medications, imaging, studies and related documents from the EMR.  Records reviewed and summarized above.    Physical Exam: General: frail appearing, elderly male, debilitated ENMT: oral mucous membranes moist CV: S1S2, RRR Pulmonary: LCTA MSK: w/c dependent; transfer by staff Neuro:  + generalized weakness Psych: non-anxious affect, A and O x 2  Thank you for the opportunity to participate in the care of Jason. Grizzard. Please call our office at [phone] if we can be of additional assistance.   Delois Silvester Ihor Gully, NP

## 2023-03-18 DEATH — deceased
